# Patient Record
Sex: Female | Born: 1950
Health system: Southern US, Community
[De-identification: ages and names within clinical notes are randomized; demographics above are authoritative.]

## PROBLEM LIST (undated history)

## (undated) DIAGNOSIS — E041 Nontoxic single thyroid nodule: Secondary | ICD-10-CM

## (undated) DIAGNOSIS — N319 Neuromuscular dysfunction of bladder, unspecified: Secondary | ICD-10-CM

## (undated) DIAGNOSIS — R112 Nausea with vomiting, unspecified: Secondary | ICD-10-CM

## (undated) DIAGNOSIS — K219 Gastro-esophageal reflux disease without esophagitis: Secondary | ICD-10-CM

## (undated) DIAGNOSIS — M8580 Other specified disorders of bone density and structure, unspecified site: Secondary | ICD-10-CM

## (undated) DIAGNOSIS — K589 Irritable bowel syndrome without diarrhea: Secondary | ICD-10-CM

## (undated) DIAGNOSIS — J45909 Unspecified asthma, uncomplicated: Secondary | ICD-10-CM

## (undated) DIAGNOSIS — D519 Vitamin B12 deficiency anemia, unspecified: Secondary | ICD-10-CM

## (undated) DIAGNOSIS — M797 Fibromyalgia: Secondary | ICD-10-CM

## (undated) DIAGNOSIS — M810 Age-related osteoporosis without current pathological fracture: Secondary | ICD-10-CM

## (undated) DIAGNOSIS — G35D Multiple sclerosis, unspecified: Secondary | ICD-10-CM

## (undated) DIAGNOSIS — D509 Iron deficiency anemia, unspecified: Secondary | ICD-10-CM

## (undated) DIAGNOSIS — F419 Anxiety disorder, unspecified: Secondary | ICD-10-CM

## (undated) DIAGNOSIS — M1990 Unspecified osteoarthritis, unspecified site: Secondary | ICD-10-CM

## (undated) DIAGNOSIS — I10 Essential (primary) hypertension: Secondary | ICD-10-CM

## (undated) DIAGNOSIS — G35 Multiple sclerosis: Secondary | ICD-10-CM

## (undated) DIAGNOSIS — Z9889 Other specified postprocedural states: Secondary | ICD-10-CM

## (undated) DIAGNOSIS — C169 Malignant neoplasm of stomach, unspecified: Secondary | ICD-10-CM

## (undated) DIAGNOSIS — Z9289 Personal history of other medical treatment: Secondary | ICD-10-CM

## (undated) DIAGNOSIS — K909 Intestinal malabsorption, unspecified: Secondary | ICD-10-CM

## (undated) HISTORY — DX: Intestinal malabsorption, unspecified: K90.9

## (undated) HISTORY — PX: TONSILLECTOMY: SUR1361

## (undated) HISTORY — DX: Irritable bowel syndrome, unspecified: K58.9

## (undated) HISTORY — DX: Other specified disorders of bone density and structure, unspecified site: M85.80

## (undated) HISTORY — DX: Multiple sclerosis, unspecified: G35.D

## (undated) HISTORY — DX: Iron deficiency anemia, unspecified: D50.9

## (undated) HISTORY — DX: Multiple sclerosis: G35

## (undated) HISTORY — DX: Neuromuscular dysfunction of bladder, unspecified: N31.9

## (undated) HISTORY — DX: Essential (primary) hypertension: I10

## (undated) HISTORY — PX: JOINT REPLACEMENT: SHX530

---

## 1988-09-02 HISTORY — PX: BIOPSY THYROID: PRO38

## 1997-06-04 ENCOUNTER — Ambulatory Visit (HOSPITAL_COMMUNITY): Admission: RE | Admit: 1997-06-04 | Discharge: 1997-06-04 | Payer: Self-pay | Admitting: Internal Medicine

## 1997-08-19 ENCOUNTER — Ambulatory Visit (HOSPITAL_COMMUNITY): Admission: RE | Admit: 1997-08-19 | Discharge: 1997-08-19 | Payer: Self-pay | Admitting: Obstetrics and Gynecology

## 1997-09-09 ENCOUNTER — Ambulatory Visit (HOSPITAL_COMMUNITY): Admission: RE | Admit: 1997-09-09 | Discharge: 1997-09-09 | Payer: Self-pay | Admitting: Gastroenterology

## 1997-09-16 ENCOUNTER — Ambulatory Visit (HOSPITAL_COMMUNITY): Admission: RE | Admit: 1997-09-16 | Discharge: 1997-09-16 | Payer: Self-pay | Admitting: Family Medicine

## 1997-10-26 ENCOUNTER — Encounter: Admission: RE | Admit: 1997-10-26 | Discharge: 1998-01-24 | Payer: Self-pay

## 1997-12-22 ENCOUNTER — Encounter: Payer: Self-pay | Admitting: Internal Medicine

## 1997-12-22 ENCOUNTER — Ambulatory Visit (HOSPITAL_COMMUNITY): Admission: RE | Admit: 1997-12-22 | Discharge: 1997-12-22 | Payer: Self-pay | Admitting: Internal Medicine

## 1998-06-10 ENCOUNTER — Ambulatory Visit (HOSPITAL_COMMUNITY): Admission: RE | Admit: 1998-06-10 | Discharge: 1998-06-10 | Payer: Self-pay | Admitting: Internal Medicine

## 1999-01-03 DIAGNOSIS — C169 Malignant neoplasm of stomach, unspecified: Secondary | ICD-10-CM

## 1999-01-03 DIAGNOSIS — Z9289 Personal history of other medical treatment: Secondary | ICD-10-CM

## 1999-01-03 HISTORY — DX: Personal history of other medical treatment: Z92.89

## 1999-01-03 HISTORY — PX: PARTIAL GASTRECTOMY: SHX2172

## 1999-01-03 HISTORY — PX: OMENTECTOMY: SHX2098

## 1999-01-03 HISTORY — DX: Malignant neoplasm of stomach, unspecified: C16.9

## 1999-02-11 ENCOUNTER — Encounter: Payer: Self-pay | Admitting: Internal Medicine

## 1999-02-11 ENCOUNTER — Ambulatory Visit (HOSPITAL_COMMUNITY): Admission: RE | Admit: 1999-02-11 | Discharge: 1999-02-11 | Payer: Self-pay | Admitting: Internal Medicine

## 1999-03-12 ENCOUNTER — Ambulatory Visit (HOSPITAL_COMMUNITY): Admission: RE | Admit: 1999-03-12 | Discharge: 1999-03-12 | Payer: Self-pay | Admitting: Neurology

## 1999-03-12 ENCOUNTER — Encounter: Payer: Self-pay | Admitting: Neurology

## 1999-03-23 ENCOUNTER — Inpatient Hospital Stay (HOSPITAL_COMMUNITY): Admission: EM | Admit: 1999-03-23 | Discharge: 1999-04-14 | Payer: Self-pay | Admitting: Emergency Medicine

## 1999-03-23 ENCOUNTER — Encounter (INDEPENDENT_AMBULATORY_CARE_PROVIDER_SITE_OTHER): Payer: Self-pay | Admitting: Specialist

## 1999-03-24 ENCOUNTER — Encounter: Payer: Self-pay | Admitting: Gastroenterology

## 1999-03-25 ENCOUNTER — Encounter: Payer: Self-pay | Admitting: Gastroenterology

## 1999-03-26 ENCOUNTER — Encounter: Payer: Self-pay | Admitting: Gastroenterology

## 1999-03-28 ENCOUNTER — Encounter: Payer: Self-pay | Admitting: General Surgery

## 1999-04-11 ENCOUNTER — Encounter: Payer: Self-pay | Admitting: General Surgery

## 1999-05-06 ENCOUNTER — Ambulatory Visit (HOSPITAL_BASED_OUTPATIENT_CLINIC_OR_DEPARTMENT_OTHER): Admission: RE | Admit: 1999-05-06 | Discharge: 1999-05-06 | Payer: Self-pay | Admitting: General Surgery

## 1999-05-06 ENCOUNTER — Encounter: Payer: Self-pay | Admitting: General Surgery

## 1999-06-01 ENCOUNTER — Encounter: Admission: RE | Admit: 1999-06-01 | Discharge: 1999-08-30 | Payer: Self-pay | Admitting: Radiation Oncology

## 1999-06-06 ENCOUNTER — Ambulatory Visit (HOSPITAL_COMMUNITY): Admission: RE | Admit: 1999-06-06 | Discharge: 1999-06-06 | Payer: Self-pay | Admitting: Radiation Oncology

## 1999-06-13 ENCOUNTER — Encounter: Payer: Self-pay | Admitting: Radiation Oncology

## 1999-09-03 ENCOUNTER — Inpatient Hospital Stay (HOSPITAL_COMMUNITY): Admission: EM | Admit: 1999-09-03 | Discharge: 1999-09-08 | Payer: Self-pay | Admitting: Oncology

## 1999-09-04 ENCOUNTER — Encounter: Payer: Self-pay | Admitting: Oncology

## 1999-09-07 ENCOUNTER — Encounter: Payer: Self-pay | Admitting: Hematology & Oncology

## 1999-09-07 ENCOUNTER — Encounter: Admission: RE | Admit: 1999-09-07 | Discharge: 1999-09-07 | Payer: Self-pay | Admitting: Hematology & Oncology

## 1999-09-17 ENCOUNTER — Emergency Department (HOSPITAL_COMMUNITY): Admission: EM | Admit: 1999-09-17 | Discharge: 1999-09-17 | Payer: Self-pay | Admitting: Emergency Medicine

## 1999-10-17 ENCOUNTER — Encounter: Payer: Self-pay | Admitting: Hematology & Oncology

## 1999-10-17 ENCOUNTER — Ambulatory Visit (HOSPITAL_COMMUNITY): Admission: RE | Admit: 1999-10-17 | Discharge: 1999-10-17 | Payer: Self-pay | Admitting: Hematology & Oncology

## 1999-10-24 ENCOUNTER — Ambulatory Visit (HOSPITAL_COMMUNITY): Admission: RE | Admit: 1999-10-24 | Discharge: 1999-10-24 | Payer: Self-pay | Admitting: Hematology & Oncology

## 1999-10-24 ENCOUNTER — Encounter: Payer: Self-pay | Admitting: Hematology & Oncology

## 2000-01-23 ENCOUNTER — Ambulatory Visit (HOSPITAL_COMMUNITY): Admission: RE | Admit: 2000-01-23 | Discharge: 2000-01-23 | Payer: Self-pay | Admitting: Hematology & Oncology

## 2000-01-23 ENCOUNTER — Encounter: Payer: Self-pay | Admitting: Hematology & Oncology

## 2000-02-14 ENCOUNTER — Encounter: Payer: Self-pay | Admitting: General Surgery

## 2000-02-14 ENCOUNTER — Ambulatory Visit (HOSPITAL_COMMUNITY): Admission: RE | Admit: 2000-02-14 | Discharge: 2000-02-14 | Payer: Self-pay | Admitting: Internal Medicine

## 2000-02-14 ENCOUNTER — Emergency Department (HOSPITAL_COMMUNITY): Admission: EM | Admit: 2000-02-14 | Discharge: 2000-02-14 | Payer: Self-pay | Admitting: Emergency Medicine

## 2000-02-16 ENCOUNTER — Encounter: Admission: RE | Admit: 2000-02-16 | Discharge: 2000-02-16 | Payer: Self-pay | Admitting: Internal Medicine

## 2000-02-16 ENCOUNTER — Encounter: Payer: Self-pay | Admitting: Internal Medicine

## 2000-03-05 ENCOUNTER — Encounter: Payer: Self-pay | Admitting: Internal Medicine

## 2000-03-05 ENCOUNTER — Ambulatory Visit (HOSPITAL_COMMUNITY): Admission: RE | Admit: 2000-03-05 | Discharge: 2000-03-05 | Payer: Self-pay

## 2000-03-16 ENCOUNTER — Ambulatory Visit (HOSPITAL_COMMUNITY): Admission: RE | Admit: 2000-03-16 | Discharge: 2000-03-16 | Payer: Self-pay | Admitting: Gastroenterology

## 2000-04-12 ENCOUNTER — Ambulatory Visit (HOSPITAL_BASED_OUTPATIENT_CLINIC_OR_DEPARTMENT_OTHER): Admission: RE | Admit: 2000-04-12 | Discharge: 2000-04-12 | Payer: Self-pay | Admitting: General Surgery

## 2000-05-02 ENCOUNTER — Encounter: Payer: Self-pay | Admitting: Internal Medicine

## 2000-05-02 ENCOUNTER — Ambulatory Visit (HOSPITAL_COMMUNITY): Admission: RE | Admit: 2000-05-02 | Discharge: 2000-05-02 | Payer: Self-pay | Admitting: Internal Medicine

## 2000-06-07 ENCOUNTER — Encounter: Payer: Self-pay | Admitting: Hematology & Oncology

## 2000-06-07 ENCOUNTER — Encounter: Admission: RE | Admit: 2000-06-07 | Discharge: 2000-06-07 | Payer: Self-pay | Admitting: Hematology & Oncology

## 2000-09-11 ENCOUNTER — Encounter: Payer: Self-pay | Admitting: Hematology & Oncology

## 2000-09-11 ENCOUNTER — Encounter: Admission: RE | Admit: 2000-09-11 | Discharge: 2000-09-11 | Payer: Self-pay | Admitting: Hematology & Oncology

## 2000-09-14 ENCOUNTER — Other Ambulatory Visit: Admission: RE | Admit: 2000-09-14 | Discharge: 2000-09-14 | Payer: Self-pay | Admitting: Obstetrics and Gynecology

## 2000-09-26 ENCOUNTER — Encounter: Payer: Self-pay | Admitting: Neurology

## 2000-09-26 ENCOUNTER — Encounter: Admission: RE | Admit: 2000-09-26 | Discharge: 2000-09-26 | Payer: Self-pay | Admitting: Neurology

## 2000-10-10 ENCOUNTER — Emergency Department (HOSPITAL_COMMUNITY): Admission: EM | Admit: 2000-10-10 | Discharge: 2000-10-10 | Payer: Self-pay | Admitting: Emergency Medicine

## 2000-10-10 ENCOUNTER — Encounter: Payer: Self-pay | Admitting: Emergency Medicine

## 2000-11-22 ENCOUNTER — Encounter: Admission: RE | Admit: 2000-11-22 | Discharge: 2000-11-22 | Payer: Self-pay | Admitting: Hematology & Oncology

## 2000-11-22 ENCOUNTER — Encounter: Payer: Self-pay | Admitting: Hematology & Oncology

## 2000-11-27 ENCOUNTER — Encounter: Admission: RE | Admit: 2000-11-27 | Discharge: 2000-11-27 | Payer: Self-pay | Admitting: Internal Medicine

## 2000-11-27 ENCOUNTER — Encounter: Payer: Self-pay | Admitting: Internal Medicine

## 2000-12-06 ENCOUNTER — Encounter: Payer: Self-pay | Admitting: Internal Medicine

## 2000-12-06 ENCOUNTER — Ambulatory Visit (HOSPITAL_COMMUNITY): Admission: RE | Admit: 2000-12-06 | Discharge: 2000-12-06 | Payer: Self-pay | Admitting: Internal Medicine

## 2001-03-04 ENCOUNTER — Encounter: Payer: Self-pay | Admitting: Hematology & Oncology

## 2001-03-04 ENCOUNTER — Encounter: Admission: RE | Admit: 2001-03-04 | Discharge: 2001-03-04 | Payer: Self-pay | Admitting: Hematology & Oncology

## 2001-05-19 ENCOUNTER — Emergency Department (HOSPITAL_COMMUNITY): Admission: EM | Admit: 2001-05-19 | Discharge: 2001-05-20 | Payer: Self-pay | Admitting: Emergency Medicine

## 2001-05-28 ENCOUNTER — Ambulatory Visit (HOSPITAL_COMMUNITY): Admission: RE | Admit: 2001-05-28 | Discharge: 2001-05-28 | Payer: Self-pay | Admitting: Gastroenterology

## 2001-06-04 ENCOUNTER — Encounter: Payer: Self-pay | Admitting: Internal Medicine

## 2001-06-04 ENCOUNTER — Ambulatory Visit (HOSPITAL_COMMUNITY): Admission: RE | Admit: 2001-06-04 | Discharge: 2001-06-04 | Payer: Self-pay | Admitting: Internal Medicine

## 2001-06-26 ENCOUNTER — Encounter: Payer: Self-pay | Admitting: Hematology & Oncology

## 2001-06-26 ENCOUNTER — Encounter: Admission: RE | Admit: 2001-06-26 | Discharge: 2001-06-26 | Payer: Self-pay | Admitting: Hematology & Oncology

## 2001-09-17 ENCOUNTER — Encounter: Payer: Self-pay | Admitting: Internal Medicine

## 2001-09-17 ENCOUNTER — Encounter: Admission: RE | Admit: 2001-09-17 | Discharge: 2001-09-17 | Payer: Self-pay | Admitting: Internal Medicine

## 2001-11-07 ENCOUNTER — Encounter: Payer: Self-pay | Admitting: Obstetrics and Gynecology

## 2001-11-07 ENCOUNTER — Encounter: Admission: RE | Admit: 2001-11-07 | Discharge: 2001-11-07 | Payer: Self-pay | Admitting: Obstetrics and Gynecology

## 2001-12-08 ENCOUNTER — Emergency Department (HOSPITAL_COMMUNITY): Admission: EM | Admit: 2001-12-08 | Discharge: 2001-12-09 | Payer: Self-pay | Admitting: Emergency Medicine

## 2001-12-08 ENCOUNTER — Encounter: Payer: Self-pay | Admitting: Emergency Medicine

## 2001-12-09 ENCOUNTER — Encounter: Payer: Self-pay | Admitting: General Surgery

## 2001-12-09 ENCOUNTER — Ambulatory Visit (HOSPITAL_COMMUNITY): Admission: RE | Admit: 2001-12-09 | Discharge: 2001-12-09 | Payer: Self-pay | Admitting: General Surgery

## 2001-12-12 ENCOUNTER — Encounter: Payer: Self-pay | Admitting: Internal Medicine

## 2001-12-12 ENCOUNTER — Encounter: Admission: RE | Admit: 2001-12-12 | Discharge: 2001-12-12 | Payer: Self-pay | Admitting: Internal Medicine

## 2001-12-14 ENCOUNTER — Encounter: Admission: RE | Admit: 2001-12-14 | Discharge: 2001-12-14 | Payer: Self-pay | Admitting: Internal Medicine

## 2001-12-14 ENCOUNTER — Encounter: Payer: Self-pay | Admitting: Internal Medicine

## 2001-12-16 ENCOUNTER — Encounter: Payer: Self-pay | Admitting: Gastroenterology

## 2001-12-16 ENCOUNTER — Ambulatory Visit (HOSPITAL_COMMUNITY): Admission: RE | Admit: 2001-12-16 | Discharge: 2001-12-16 | Payer: Self-pay | Admitting: Gastroenterology

## 2002-01-13 ENCOUNTER — Ambulatory Visit (HOSPITAL_COMMUNITY): Admission: RE | Admit: 2002-01-13 | Discharge: 2002-01-13 | Payer: Self-pay | Admitting: Internal Medicine

## 2002-01-13 ENCOUNTER — Encounter: Payer: Self-pay | Admitting: Internal Medicine

## 2002-01-13 ENCOUNTER — Encounter (INDEPENDENT_AMBULATORY_CARE_PROVIDER_SITE_OTHER): Payer: Self-pay | Admitting: *Deleted

## 2002-03-03 ENCOUNTER — Emergency Department (HOSPITAL_COMMUNITY): Admission: EM | Admit: 2002-03-03 | Discharge: 2002-03-03 | Payer: Self-pay | Admitting: Emergency Medicine

## 2002-03-04 ENCOUNTER — Encounter: Payer: Self-pay | Admitting: Emergency Medicine

## 2002-03-17 ENCOUNTER — Encounter: Admission: RE | Admit: 2002-03-17 | Discharge: 2002-04-04 | Payer: Self-pay | Admitting: Neurology

## 2002-03-25 ENCOUNTER — Encounter: Admission: RE | Admit: 2002-03-25 | Discharge: 2002-03-25 | Payer: Self-pay | Admitting: Internal Medicine

## 2002-03-25 ENCOUNTER — Encounter: Payer: Self-pay | Admitting: Internal Medicine

## 2002-12-15 ENCOUNTER — Encounter: Admission: RE | Admit: 2002-12-15 | Discharge: 2002-12-15 | Payer: Self-pay | Admitting: Internal Medicine

## 2002-12-24 ENCOUNTER — Emergency Department (HOSPITAL_COMMUNITY): Admission: EM | Admit: 2002-12-24 | Discharge: 2002-12-24 | Payer: Self-pay | Admitting: Emergency Medicine

## 2002-12-29 ENCOUNTER — Encounter: Admission: RE | Admit: 2002-12-29 | Discharge: 2002-12-29 | Payer: Self-pay | Admitting: Internal Medicine

## 2003-01-05 ENCOUNTER — Emergency Department (HOSPITAL_COMMUNITY): Admission: EM | Admit: 2003-01-05 | Discharge: 2003-01-05 | Payer: Self-pay | Admitting: Emergency Medicine

## 2003-03-10 ENCOUNTER — Ambulatory Visit (HOSPITAL_COMMUNITY): Admission: RE | Admit: 2003-03-10 | Discharge: 2003-03-10 | Payer: Self-pay | Admitting: Internal Medicine

## 2003-03-18 ENCOUNTER — Encounter: Admission: RE | Admit: 2003-03-18 | Discharge: 2003-04-16 | Payer: Self-pay | Admitting: Neurology

## 2003-03-19 ENCOUNTER — Encounter: Admission: RE | Admit: 2003-03-19 | Discharge: 2003-03-19 | Payer: Self-pay | Admitting: Internal Medicine

## 2003-05-31 ENCOUNTER — Inpatient Hospital Stay (HOSPITAL_COMMUNITY): Admission: AD | Admit: 2003-05-31 | Discharge: 2003-06-04 | Payer: Self-pay | Admitting: Pediatrics

## 2003-06-24 ENCOUNTER — Emergency Department (HOSPITAL_COMMUNITY): Admission: EM | Admit: 2003-06-24 | Discharge: 2003-06-25 | Payer: Self-pay | Admitting: Emergency Medicine

## 2003-07-13 ENCOUNTER — Encounter: Admission: RE | Admit: 2003-07-13 | Discharge: 2003-10-11 | Payer: Self-pay | Admitting: Pediatrics

## 2003-11-23 ENCOUNTER — Other Ambulatory Visit: Admission: RE | Admit: 2003-11-23 | Discharge: 2003-11-23 | Payer: Self-pay | Admitting: Internal Medicine

## 2004-01-11 ENCOUNTER — Ambulatory Visit: Payer: Self-pay | Admitting: Hematology & Oncology

## 2004-05-06 ENCOUNTER — Ambulatory Visit: Payer: Self-pay | Admitting: Hematology & Oncology

## 2004-08-25 ENCOUNTER — Encounter: Admission: RE | Admit: 2004-08-25 | Discharge: 2004-08-25 | Payer: Self-pay | Admitting: Internal Medicine

## 2004-09-28 ENCOUNTER — Encounter: Admission: RE | Admit: 2004-09-28 | Discharge: 2004-09-28 | Payer: Self-pay | Admitting: Internal Medicine

## 2004-10-12 ENCOUNTER — Encounter: Admission: RE | Admit: 2004-10-12 | Discharge: 2004-10-12 | Payer: Self-pay | Admitting: Internal Medicine

## 2004-10-13 ENCOUNTER — Encounter: Admission: RE | Admit: 2004-10-13 | Discharge: 2004-10-13 | Payer: Self-pay | Admitting: Internal Medicine

## 2004-11-04 ENCOUNTER — Ambulatory Visit: Payer: Self-pay | Admitting: Hematology & Oncology

## 2005-05-04 ENCOUNTER — Ambulatory Visit: Payer: Self-pay | Admitting: Hematology & Oncology

## 2005-05-08 LAB — COMPREHENSIVE METABOLIC PANEL
ALT: 24 U/L (ref 0–40)
AST: 29 U/L (ref 0–37)
Albumin: 3.9 g/dL (ref 3.5–5.2)
Alkaline Phosphatase: 103 U/L (ref 39–117)
BUN: 13 mg/dL (ref 6–23)
Potassium: 3.9 mEq/L (ref 3.5–5.3)
Sodium: 143 mEq/L (ref 135–145)

## 2005-05-08 LAB — CBC WITH DIFFERENTIAL/PLATELET
BASO%: 0.6 % (ref 0.0–2.0)
Basophils Absolute: 0 10*3/uL (ref 0.0–0.1)
EOS%: 2.9 % (ref 0.0–7.0)
MCH: 30.6 pg (ref 26.0–34.0)
MCHC: 34.3 g/dL (ref 32.0–36.0)
MCV: 89.3 fL (ref 81.0–101.0)
MONO%: 11 % (ref 0.0–13.0)
RBC: 3.84 10*6/uL (ref 3.70–5.32)
RDW: 12.6 % (ref 11.3–14.5)

## 2005-07-13 ENCOUNTER — Other Ambulatory Visit: Admission: RE | Admit: 2005-07-13 | Discharge: 2005-07-13 | Payer: Self-pay | Admitting: Internal Medicine

## 2005-08-26 ENCOUNTER — Emergency Department (HOSPITAL_COMMUNITY): Admission: EM | Admit: 2005-08-26 | Discharge: 2005-08-26 | Payer: Self-pay | Admitting: Emergency Medicine

## 2005-11-02 ENCOUNTER — Ambulatory Visit: Payer: Self-pay | Admitting: Hematology & Oncology

## 2005-11-06 LAB — CBC WITH DIFFERENTIAL/PLATELET
EOS%: 2.6 % (ref 0.0–7.0)
HGB: 11.7 g/dL (ref 11.6–15.9)
MCH: 30.6 pg (ref 26.0–34.0)
MCV: 90 fL (ref 81.0–101.0)
MONO%: 9.6 % (ref 0.0–13.0)
NEUT#: 1.8 10*3/uL (ref 1.5–6.5)
RBC: 3.83 10*6/uL (ref 3.70–5.32)
RDW: 12.5 % (ref 11.3–14.5)
lymph#: 1.5 10*3/uL (ref 0.9–3.3)

## 2005-11-06 LAB — COMPREHENSIVE METABOLIC PANEL
ALT: 22 U/L (ref 0–35)
AST: 29 U/L (ref 0–37)
Albumin: 4.1 g/dL (ref 3.5–5.2)
Alkaline Phosphatase: 98 U/L (ref 39–117)
Calcium: 9.1 mg/dL (ref 8.4–10.5)
Chloride: 106 mEq/L (ref 96–112)
Potassium: 3.7 mEq/L (ref 3.5–5.3)
Sodium: 141 mEq/L (ref 135–145)
Total Protein: 7.2 g/dL (ref 6.0–8.3)

## 2005-11-07 ENCOUNTER — Encounter: Admission: RE | Admit: 2005-11-07 | Discharge: 2005-11-07 | Payer: Self-pay | Admitting: Internal Medicine

## 2006-05-03 ENCOUNTER — Ambulatory Visit: Payer: Self-pay | Admitting: Hematology & Oncology

## 2006-05-07 LAB — CBC WITH DIFFERENTIAL/PLATELET
BASO%: 0.3 % (ref 0.0–2.0)
Eosinophils Absolute: 0.1 10*3/uL (ref 0.0–0.5)
LYMPH%: 43.3 % (ref 14.0–48.0)
MCHC: 34.6 g/dL (ref 32.0–36.0)
MONO#: 0.4 10*3/uL (ref 0.1–0.9)
NEUT#: 1.5 10*3/uL (ref 1.5–6.5)
RBC: 3.79 10*6/uL (ref 3.70–5.32)
RDW: 12.8 % (ref 11.3–14.5)
WBC: 3.5 10*3/uL — ABNORMAL LOW (ref 3.9–10.0)

## 2006-05-07 LAB — COMPREHENSIVE METABOLIC PANEL
ALT: 21 U/L (ref 0–35)
Albumin: 4 g/dL (ref 3.5–5.2)
Alkaline Phosphatase: 109 U/L (ref 39–117)
CO2: 28 mEq/L (ref 19–32)
Glucose, Bld: 81 mg/dL (ref 70–99)
Potassium: 4.1 mEq/L (ref 3.5–5.3)
Sodium: 146 mEq/L — ABNORMAL HIGH (ref 135–145)
Total Bilirubin: 0.5 mg/dL (ref 0.3–1.2)
Total Protein: 6.6 g/dL (ref 6.0–8.3)

## 2006-05-07 LAB — LACTATE DEHYDROGENASE: LDH: 145 U/L (ref 94–250)

## 2006-11-01 ENCOUNTER — Ambulatory Visit: Payer: Self-pay | Admitting: Hematology & Oncology

## 2006-11-05 LAB — CBC WITH DIFFERENTIAL/PLATELET
BASO%: 0.4 % (ref 0.0–2.0)
EOS%: 2.7 % (ref 0.0–7.0)
HCT: 37.4 % (ref 34.8–46.6)
LYMPH%: 39.7 % (ref 14.0–48.0)
MCH: 30.2 pg (ref 26.0–34.0)
MCHC: 34 g/dL (ref 32.0–36.0)
NEUT%: 47.7 % (ref 39.6–76.8)
Platelets: 186 10*3/uL (ref 145–400)
RBC: 4.2 10*6/uL (ref 3.70–5.32)
WBC: 4.7 10*3/uL (ref 3.9–10.0)

## 2006-11-05 LAB — COMPREHENSIVE METABOLIC PANEL
ALT: 18 U/L (ref 0–35)
AST: 24 U/L (ref 0–37)
Alkaline Phosphatase: 110 U/L (ref 39–117)
Creatinine, Ser: 1.1 mg/dL (ref 0.40–1.20)
Sodium: 144 mEq/L (ref 135–145)
Total Bilirubin: 0.5 mg/dL (ref 0.3–1.2)
Total Protein: 7.7 g/dL (ref 6.0–8.3)

## 2006-11-09 ENCOUNTER — Ambulatory Visit (HOSPITAL_COMMUNITY): Admission: RE | Admit: 2006-11-09 | Discharge: 2006-11-09 | Payer: Self-pay | Admitting: Hematology & Oncology

## 2006-12-17 ENCOUNTER — Ambulatory Visit (HOSPITAL_COMMUNITY): Admission: RE | Admit: 2006-12-17 | Discharge: 2006-12-17 | Payer: Self-pay | Admitting: Internal Medicine

## 2007-05-01 ENCOUNTER — Ambulatory Visit: Payer: Self-pay | Admitting: Hematology & Oncology

## 2007-05-06 LAB — CBC WITH DIFFERENTIAL/PLATELET
Basophils Absolute: 0 10*3/uL (ref 0.0–0.1)
EOS%: 2.8 % (ref 0.0–7.0)
Eosinophils Absolute: 0.1 10*3/uL (ref 0.0–0.5)
HGB: 12.1 g/dL (ref 11.6–15.9)
MONO#: 0.4 10*3/uL (ref 0.1–0.9)
NEUT#: 1.6 10*3/uL (ref 1.5–6.5)
RDW: 12.9 % (ref 11.3–14.5)
WBC: 3.2 10*3/uL — ABNORMAL LOW (ref 3.9–10.0)
lymph#: 1.1 10*3/uL (ref 0.9–3.3)

## 2007-05-06 LAB — COMPREHENSIVE METABOLIC PANEL
AST: 24 U/L (ref 0–37)
Albumin: 4.1 g/dL (ref 3.5–5.2)
BUN: 14 mg/dL (ref 6–23)
CO2: 27 mEq/L (ref 19–32)
Calcium: 9.6 mg/dL (ref 8.4–10.5)
Chloride: 107 mEq/L (ref 96–112)
Potassium: 4.4 mEq/L (ref 3.5–5.3)

## 2007-05-07 ENCOUNTER — Other Ambulatory Visit: Admission: RE | Admit: 2007-05-07 | Discharge: 2007-05-07 | Payer: Self-pay | Admitting: Internal Medicine

## 2007-09-24 ENCOUNTER — Encounter: Admission: RE | Admit: 2007-09-24 | Discharge: 2007-09-24 | Payer: Self-pay | Admitting: Internal Medicine

## 2007-10-11 ENCOUNTER — Ambulatory Visit: Payer: Self-pay | Admitting: Internal Medicine

## 2007-11-08 ENCOUNTER — Ambulatory Visit: Payer: Self-pay | Admitting: Hematology & Oncology

## 2008-02-04 ENCOUNTER — Ambulatory Visit: Payer: Self-pay | Admitting: Hematology & Oncology

## 2008-02-05 LAB — CBC WITH DIFFERENTIAL (CANCER CENTER ONLY)
BASO%: 0.5 % (ref 0.0–2.0)
Eosinophils Absolute: 0.1 10*3/uL (ref 0.0–0.5)
LYMPH%: 42.6 % (ref 14.0–48.0)
MCV: 88 fL (ref 81–101)
MONO#: 0.2 10*3/uL (ref 0.1–0.9)
NEUT#: 0.9 10*3/uL — ABNORMAL LOW (ref 1.5–6.5)
Platelets: 165 10*3/uL (ref 145–400)
RBC: 3.76 10*6/uL (ref 3.70–5.32)
RDW: 11.8 % (ref 10.5–14.6)
WBC: 2.1 10*3/uL — ABNORMAL LOW (ref 3.9–10.0)

## 2008-02-05 LAB — COMPREHENSIVE METABOLIC PANEL
Albumin: 4 g/dL (ref 3.5–5.2)
BUN: 12 mg/dL (ref 6–23)
Calcium: 8.8 mg/dL (ref 8.4–10.5)
Chloride: 107 mEq/L (ref 96–112)
Glucose, Bld: 96 mg/dL (ref 70–99)
Potassium: 3.7 mEq/L (ref 3.5–5.3)

## 2008-03-04 ENCOUNTER — Encounter: Admission: RE | Admit: 2008-03-04 | Discharge: 2008-03-04 | Payer: Self-pay | Admitting: Neurology

## 2008-04-10 ENCOUNTER — Other Ambulatory Visit: Admission: RE | Admit: 2008-04-10 | Discharge: 2008-04-10 | Payer: Self-pay | Admitting: Internal Medicine

## 2008-04-10 ENCOUNTER — Ambulatory Visit: Payer: Self-pay | Admitting: Internal Medicine

## 2008-08-14 ENCOUNTER — Ambulatory Visit: Payer: Self-pay | Admitting: Internal Medicine

## 2008-08-18 ENCOUNTER — Ambulatory Visit: Payer: Self-pay | Admitting: Hematology & Oncology

## 2008-08-19 LAB — CBC WITH DIFFERENTIAL (CANCER CENTER ONLY)
BASO%: 0.3 % (ref 0.0–2.0)
HCT: 35.8 % (ref 34.8–46.6)
LYMPH%: 42.7 % (ref 14.0–48.0)
MCH: 30.1 pg (ref 26.0–34.0)
MCHC: 33.8 g/dL (ref 32.0–36.0)
MCV: 89 fL (ref 81–101)
MONO#: 0.2 10*3/uL (ref 0.1–0.9)
MONO%: 8.6 % (ref 0.0–13.0)
NEUT%: 44 % (ref 39.6–80.0)
Platelets: 187 10*3/uL (ref 145–400)
RDW: 11.4 % (ref 10.5–14.6)
WBC: 2.6 10*3/uL — ABNORMAL LOW (ref 3.9–10.0)

## 2008-08-19 LAB — COMPREHENSIVE METABOLIC PANEL
ALT: 14 U/L (ref 0–35)
AST: 25 U/L (ref 0–37)
Alkaline Phosphatase: 88 U/L (ref 39–117)
BUN: 17 mg/dL (ref 6–23)
Creatinine, Ser: 1.28 mg/dL — ABNORMAL HIGH (ref 0.40–1.20)
Potassium: 3.8 mEq/L (ref 3.5–5.3)

## 2008-08-20 ENCOUNTER — Ambulatory Visit: Payer: Self-pay | Admitting: Internal Medicine

## 2008-09-28 ENCOUNTER — Ambulatory Visit: Payer: Self-pay | Admitting: Internal Medicine

## 2008-10-13 ENCOUNTER — Ambulatory Visit: Payer: Self-pay | Admitting: Internal Medicine

## 2008-10-16 ENCOUNTER — Ambulatory Visit: Payer: Self-pay | Admitting: Internal Medicine

## 2008-11-12 ENCOUNTER — Ambulatory Visit: Payer: Self-pay | Admitting: Internal Medicine

## 2009-01-02 HISTORY — PX: TOTAL SHOULDER ARTHROPLASTY: SHX126

## 2009-02-04 ENCOUNTER — Ambulatory Visit: Payer: Self-pay | Admitting: Hematology & Oncology

## 2009-02-05 LAB — CBC WITH DIFFERENTIAL (CANCER CENTER ONLY)
BASO#: 0 10*3/uL (ref 0.0–0.2)
Eosinophils Absolute: 0.1 10*3/uL (ref 0.0–0.5)
HGB: 14.6 g/dL (ref 11.6–15.9)
LYMPH#: 1.6 10*3/uL (ref 0.9–3.3)
MCH: 30.2 pg (ref 26.0–34.0)
MCHC: 33 g/dL (ref 32.0–36.0)
MONO#: 0.3 10*3/uL (ref 0.1–0.9)
MONO%: 9.2 % (ref 0.0–13.0)
NEUT%: 42.4 % (ref 39.6–80.0)
Platelets: 215 10*3/uL (ref 145–400)
RBC: 4.82 10*6/uL (ref 3.70–5.32)

## 2009-02-05 LAB — COMPREHENSIVE METABOLIC PANEL
ALT: 17 U/L (ref 0–35)
AST: 23 U/L (ref 0–37)
Calcium: 9.7 mg/dL (ref 8.4–10.5)
Creatinine, Ser: 1.2 mg/dL (ref 0.40–1.20)
Sodium: 141 mEq/L (ref 135–145)

## 2009-02-05 LAB — VITAMIN D 25 HYDROXY (VIT D DEFICIENCY, FRACTURES): Vit D, 25-Hydroxy: 25 ng/mL — ABNORMAL LOW (ref 30–89)

## 2009-02-09 ENCOUNTER — Ambulatory Visit: Payer: Self-pay | Admitting: Internal Medicine

## 2009-02-22 ENCOUNTER — Ambulatory Visit: Payer: Self-pay | Admitting: Internal Medicine

## 2009-02-26 ENCOUNTER — Ambulatory Visit (HOSPITAL_COMMUNITY): Admission: RE | Admit: 2009-02-26 | Discharge: 2009-02-26 | Payer: Self-pay | Admitting: Internal Medicine

## 2009-03-08 ENCOUNTER — Ambulatory Visit: Payer: Self-pay | Admitting: Internal Medicine

## 2009-03-19 ENCOUNTER — Encounter: Admission: RE | Admit: 2009-03-19 | Discharge: 2009-03-19 | Payer: Self-pay | Admitting: Neurology

## 2009-04-05 ENCOUNTER — Ambulatory Visit (HOSPITAL_COMMUNITY): Admission: RE | Admit: 2009-04-05 | Discharge: 2009-04-05 | Payer: Self-pay | Admitting: Hematology & Oncology

## 2009-04-06 ENCOUNTER — Ambulatory Visit: Payer: Self-pay | Admitting: Internal Medicine

## 2009-04-10 ENCOUNTER — Encounter: Admission: RE | Admit: 2009-04-10 | Discharge: 2009-04-10 | Payer: Self-pay | Admitting: Internal Medicine

## 2009-05-13 ENCOUNTER — Ambulatory Visit: Payer: Self-pay | Admitting: Internal Medicine

## 2009-06-15 ENCOUNTER — Ambulatory Visit: Payer: Self-pay | Admitting: Internal Medicine

## 2009-08-10 ENCOUNTER — Ambulatory Visit: Payer: Self-pay | Admitting: Internal Medicine

## 2009-08-19 ENCOUNTER — Ambulatory Visit: Payer: Self-pay | Admitting: Hematology & Oncology

## 2009-08-27 LAB — COMPREHENSIVE METABOLIC PANEL
AST: 28 U/L (ref 0–37)
Albumin: 4.1 g/dL (ref 3.5–5.2)
Alkaline Phosphatase: 91 U/L (ref 39–117)
BUN: 16 mg/dL (ref 6–23)
Calcium: 9.3 mg/dL (ref 8.4–10.5)
Potassium: 3.8 mEq/L (ref 3.5–5.3)
Total Bilirubin: 0.8 mg/dL (ref 0.3–1.2)
Total Protein: 7.1 g/dL (ref 6.0–8.3)

## 2009-08-27 LAB — CBC WITH DIFFERENTIAL (CANCER CENTER ONLY)
LYMPH#: 1.4 10*3/uL (ref 0.9–3.3)
LYMPH%: 40 % (ref 14.0–48.0)
NEUT#: 1.6 10*3/uL (ref 1.5–6.5)
NEUT%: 46.5 % (ref 39.6–80.0)
RDW: 11.1 % (ref 10.5–14.6)

## 2009-09-02 ENCOUNTER — Emergency Department (HOSPITAL_COMMUNITY): Admission: EM | Admit: 2009-09-02 | Discharge: 2009-09-02 | Payer: Self-pay | Admitting: Emergency Medicine

## 2009-09-10 ENCOUNTER — Ambulatory Visit: Payer: Self-pay | Admitting: Internal Medicine

## 2009-09-24 ENCOUNTER — Inpatient Hospital Stay (HOSPITAL_COMMUNITY): Admission: RE | Admit: 2009-09-24 | Discharge: 2009-09-27 | Payer: Self-pay | Admitting: Orthopedic Surgery

## 2009-10-12 ENCOUNTER — Ambulatory Visit: Payer: Self-pay | Admitting: Internal Medicine

## 2009-11-16 ENCOUNTER — Ambulatory Visit: Payer: Self-pay | Admitting: Internal Medicine

## 2009-12-16 ENCOUNTER — Ambulatory Visit: Payer: Self-pay | Admitting: Internal Medicine

## 2010-01-20 ENCOUNTER — Ambulatory Visit
Admission: RE | Admit: 2010-01-20 | Discharge: 2010-01-20 | Payer: Self-pay | Source: Home / Self Care | Attending: Internal Medicine | Admitting: Internal Medicine

## 2010-02-10 ENCOUNTER — Encounter (HOSPITAL_BASED_OUTPATIENT_CLINIC_OR_DEPARTMENT_OTHER): Payer: Medicare Other | Admitting: Hematology & Oncology

## 2010-02-10 ENCOUNTER — Other Ambulatory Visit: Payer: Self-pay | Admitting: Hematology & Oncology

## 2010-02-10 DIAGNOSIS — C161 Malignant neoplasm of fundus of stomach: Secondary | ICD-10-CM

## 2010-02-10 DIAGNOSIS — G35 Multiple sclerosis: Secondary | ICD-10-CM

## 2010-02-10 DIAGNOSIS — G35D Multiple sclerosis, unspecified: Secondary | ICD-10-CM

## 2010-02-10 DIAGNOSIS — D649 Anemia, unspecified: Secondary | ICD-10-CM

## 2010-02-10 LAB — CBC WITH DIFFERENTIAL (CANCER CENTER ONLY)
EOS%: 5 % (ref 0.0–7.0)
HCT: 31.3 % — ABNORMAL LOW (ref 34.8–46.6)
LYMPH%: 42.7 % (ref 14.0–48.0)
MCHC: 32.4 g/dL (ref 32.0–36.0)
MONO%: 8.7 % (ref 0.0–13.0)
Platelets: 177 10*3/uL (ref 145–400)
RBC: 3.74 10*6/uL (ref 3.70–5.32)
WBC: 2.5 10*3/uL — ABNORMAL LOW (ref 3.9–10.0)

## 2010-02-11 LAB — COMPREHENSIVE METABOLIC PANEL
ALT: 13 U/L (ref 0–35)
Albumin: 4 g/dL (ref 3.5–5.2)
Alkaline Phosphatase: 113 U/L (ref 39–117)
CO2: 25 mEq/L (ref 19–32)
Calcium: 9.3 mg/dL (ref 8.4–10.5)
Chloride: 105 mEq/L (ref 96–112)
Creatinine, Ser: 1.09 mg/dL (ref 0.40–1.20)
Sodium: 141 mEq/L (ref 135–145)
Total Bilirubin: 0.6 mg/dL (ref 0.3–1.2)
Total Protein: 7 g/dL (ref 6.0–8.3)

## 2010-02-24 ENCOUNTER — Ambulatory Visit: Payer: Medicare Other | Admitting: Internal Medicine

## 2010-02-24 DIAGNOSIS — E538 Deficiency of other specified B group vitamins: Secondary | ICD-10-CM

## 2010-03-17 LAB — BASIC METABOLIC PANEL
BUN: 13 mg/dL (ref 6–23)
BUN: 8 mg/dL (ref 6–23)
CO2: 27 mEq/L (ref 19–32)
CO2: 30 mEq/L (ref 19–32)
CO2: 27 mEq/L (ref 19–32)
Calcium: 8.5 mg/dL (ref 8.4–10.5)
Chloride: 101 mEq/L (ref 96–112)
Chloride: 111 mEq/L (ref 96–112)
Creatinine, Ser: 0.9 mg/dL (ref 0.4–1.2)
Creatinine, Ser: 1.08 mg/dL (ref 0.4–1.2)
GFR calc Af Amer: 57 mL/min — ABNORMAL LOW (ref >60)
GFR calc Af Amer: >60 mL/min (ref >60)
Glucose, Bld: 126 mg/dL — ABNORMAL HIGH (ref 70–99)
Glucose, Bld: 86 mg/dL (ref 70–99)
Potassium: 4 mEq/L (ref 3.5–5.1)
Potassium: 4.3 mEq/L (ref 3.5–5.1)
Sodium: 142 mEq/L (ref 135–145)

## 2010-03-17 LAB — CBC
HCT: 27.7 % — ABNORMAL LOW (ref 36.0–46.0)
HCT: 36.2 % (ref 36.0–46.0)
Hemoglobin: 10.5 g/dL — ABNORMAL LOW (ref 12.0–15.0)
MCH: 29.4 pg (ref 26.0–34.0)
MCH: 29.5 pg (ref 26.0–34.0)
MCH: 30.4 pg (ref 26.0–34.0)
MCV: 89.6 fL (ref 78.0–100.0)
MCV: 89.6 fL (ref 78.0–100.0)
Platelets: 126 10*3/uL — ABNORMAL LOW (ref 150–400)
Platelets: 199 10*3/uL (ref 150–400)
Platelets: 135 10*3/uL — ABNORMAL LOW (ref 150–400)
RBC: 3.45 MIL/uL — ABNORMAL LOW (ref 3.87–5.11)
RDW: 11.9 % (ref 11.5–15.5)
WBC: 3.9 10*3/uL — ABNORMAL LOW (ref 4.0–10.5)

## 2010-03-17 LAB — URINE MICROSCOPIC-ADD ON

## 2010-03-17 LAB — DIFFERENTIAL
Eosinophils Absolute: 0.1 10*3/uL (ref 0.0–0.7)
Eosinophils Absolute: 0.1 10*3/uL (ref 0.0–0.7)
Lymphocytes Relative: 42 % (ref 12–46)
Lymphocytes Relative: 24 % (ref 12–46)
Lymphs Abs: 1.3 10*3/uL (ref 0.7–4.0)
Lymphs Abs: 1 10*3/uL (ref 0.7–4.0)
Monocytes Relative: 12 % (ref 3–12)
Monocytes Relative: 14 % — ABNORMAL HIGH (ref 3–12)
Neutro Abs: 1.3 10*3/uL — ABNORMAL LOW (ref 1.7–7.7)
Neutrophils Relative %: 42 % — ABNORMAL LOW (ref 43–77)
Neutrophils Relative %: 60 % (ref 43–77)

## 2010-03-17 LAB — URINALYSIS, ROUTINE W REFLEX MICROSCOPIC
Glucose, UA: NEGATIVE mg/dL
Hgb urine dipstick: NEGATIVE
Protein, ur: NEGATIVE mg/dL
Specific Gravity, Urine: 1.021 (ref 1.005–1.030)
pH: 5 (ref 5.0–8.0)

## 2010-03-17 LAB — TYPE AND SCREEN: ABO/RH(D): B POS

## 2010-03-17 LAB — SURGICAL PCR SCREEN: MRSA, PCR: NEGATIVE

## 2010-03-17 LAB — POCT CARDIAC MARKERS
Myoglobin, poc: 91.7 ng/mL (ref 12–200)
Troponin i, poc: <0.05 ng/mL (ref 0.00–0.09)

## 2010-03-17 LAB — D-DIMER, QUANTITATIVE: D-Dimer, Quant: <0.22 ug/mL-FEU (ref 0.00–0.48)

## 2010-03-24 ENCOUNTER — Other Ambulatory Visit: Payer: Self-pay | Admitting: Hematology & Oncology

## 2010-03-24 ENCOUNTER — Encounter (HOSPITAL_BASED_OUTPATIENT_CLINIC_OR_DEPARTMENT_OTHER): Payer: Medicare Other | Admitting: Hematology & Oncology

## 2010-03-24 ENCOUNTER — Encounter (INDEPENDENT_AMBULATORY_CARE_PROVIDER_SITE_OTHER): Payer: Medicare Other | Admitting: Internal Medicine

## 2010-03-24 DIAGNOSIS — R002 Palpitations: Secondary | ICD-10-CM

## 2010-03-24 DIAGNOSIS — C161 Malignant neoplasm of fundus of stomach: Secondary | ICD-10-CM

## 2010-03-24 DIAGNOSIS — G35 Multiple sclerosis: Secondary | ICD-10-CM

## 2010-03-24 DIAGNOSIS — D509 Iron deficiency anemia, unspecified: Secondary | ICD-10-CM

## 2010-03-24 DIAGNOSIS — E538 Deficiency of other specified B group vitamins: Secondary | ICD-10-CM

## 2010-03-24 LAB — CBC WITH DIFFERENTIAL (CANCER CENTER ONLY)
BASO#: 0 10*3/uL (ref 0.0–0.2)
EOS%: 2.5 % (ref 0.0–7.0)
Eosinophils Absolute: 0.1 10*3/uL (ref 0.0–0.5)
HCT: 35.1 % (ref 34.8–46.6)
HGB: 11.8 g/dL (ref 11.6–15.9)
LYMPH%: 44 % (ref 14.0–48.0)
MCH: 28.8 pg (ref 26.0–34.0)
MCHC: 33.6 g/dL (ref 32.0–36.0)
MCV: 86 fL (ref 81–101)
MONO%: 11.6 % (ref 0.0–13.0)
NEUT#: 1.2 10*3/uL — ABNORMAL LOW (ref 1.5–6.5)
NEUT%: 41.5 % (ref 39.6–80.0)
RBC: 4.1 10*6/uL (ref 3.70–5.32)

## 2010-03-24 LAB — FERRITIN: Ferritin: 165 ng/mL (ref 10–291)

## 2010-03-24 LAB — RETICULOCYTES (CHCC)
ABS Retic: 29.2 10*3/uL (ref 19.0–186.0)
RBC.: 4.17 MIL/uL (ref 3.87–5.11)
Retic Ct Pct: 0.7 % (ref 0.4–3.1)

## 2010-03-24 LAB — IRON AND TIBC: Iron: 97 ug/dL (ref 42–145)

## 2010-03-28 ENCOUNTER — Encounter (INDEPENDENT_AMBULATORY_CARE_PROVIDER_SITE_OTHER): Payer: Medicare Other

## 2010-03-28 DIAGNOSIS — R002 Palpitations: Secondary | ICD-10-CM

## 2010-04-14 ENCOUNTER — Other Ambulatory Visit: Payer: Medicare Other | Admitting: Internal Medicine

## 2010-04-28 ENCOUNTER — Ambulatory Visit: Payer: Medicare Other | Admitting: Internal Medicine

## 2010-05-20 NOTE — H&P (Signed)
Erika Murphy, Erika Murphy                            ACCOUNT NO.:  [id number]   MEDICAL RECORD NO.:  [id number]                   PATIENT TYPE:  INP   LOCATION:  3018                                 FACILITY:  [facility]   PHYSICIAN:  Deanna Artis. Sharene Skeans, M.D.           DATE OF BIRTH:  1950-07-25   DATE OF ADMISSION:  05/31/2003  DATE OF DISCHARGE:                                HISTORY & PHYSICAL   HISTORY OF PRESENT ILLNESS:  A 60 year old woman with a 14-year history of  multiple sclerosis largely axial without optic neuritis.  The patient has  had a 3-week history of low back pain with no signs of multiple sclerosis or  spondylosis on a lumbosacral spine film MRI May 19th.   The patient was placed on prednisone 40 mg a day and tapered down to 10 over  a 7-day period.  She was somewhat better but has deteriorated over the past  3 days and is unable to walk even with assistance today.   The patient does not feel her bladder fill nor does she have urgency.  She  has low back pain which is a sign of a full bladder.  She urinates without  hesitancy with relief of her back pain.  She had constipation which was  relieved, no fecal incontinence.  She has had blurred vision without  diplopia.  She has mild difficulty swallowing.  She has had a dry hacking  cough without fever.  She has cramps in her right arm.  There is no  Lhermitte's sign.   REVIEW OF SYSTEMS:  Normal appetite, decreased sleep because of low back  pain.  No sinusitis, pharyngitis, or otitis.  No shortness of breath,  productive sputum, or hemoptysis.  No chest pain, hypertension, or  palpitations.  No nausea, vomiting, or diarrhea.  No joint pain or rash.  No  diabetes or thyroid disease.  No depression or anxiety.  Neurologic review  of systems otherwise negative.  Twelve-system review otherwise negative.   PAST MEDICAL HISTORY:  1. Gastric carcinoma treated with chemotherapy and radiation in remission.  2. Chronic abdominal  pain, nonspecific.   PAST SURGICAL HISTORY:  1. A 70% gastrectomy.  2. Cesarean section.   MEDICATIONS:  1. Betaseron every other day.  2. Ditropan 5 mg b.i.d.   ALLERGIES:  None.   FAMILY HISTORY:  Father age 85 has hypertension but no serious illnesses.  Mother died of congestive heart failure at age 16.  Sister has diabetes.  No  multiple sclerosis, stroke, seizures, Alzheimer's, or Parkinson's disease.   SOCIAL HISTORY:  The patient has one child.  She lives alone.  She is  retired from [employer].  No tobacco or alcohol use.   PHYSICAL EXAMINATION:  VITAL SIGNS:  Today, pulse 84, she is afebrile, the  rest of her vital signs have not yet been taken.  EAR, NOSE, AND THROAT:  Normal.  NECK:  Supple, no bruits.  LUNGS:  Clear.  HEART:  No murmur.  Pulses normal.  ABDOMEN:  Soft, bowel sounds normal.  EXTREMITIES:  Well formed with a slightly tender right leg.  NEUROLOGICAL:  Awake, alert, fully oriented.  Normal language, memory and  orientation.  Cranial Nerves:  Round reactive pupils.  No afferent pupillary  defects.  Symmetric facial strength.  Midline tongue and uvula.  Air  conduction greater than bone conduction bilaterally.  Normal fundi.  Visual  fields full to double simultaneous stimuli.  Motor Examination:  Normal  strength  in the upper extremities, no drift.  Fine motor movements are  normal.  Lower extremities:  Hip flexors 4- left, 3 right.  Psoas 4 left, 4-  right.  Knee extensors 4 left, 2 right.  Foot dorsiflexors 4 left, 1 to2  right.  Planter flexors 5 left, 1 to 2 right.  Sensory:  Hyperesthesia right  lower extremity.  Otherwise normal sensation with good proprioception,  vibration, stereoagnosis, no sensory level.  Normal rectal tone.  Gait:  She  can walk with a cane, dragging her right leg.  Deep tendon reflexes were  brisk, no reflex predominance.  The patient has bilateral flexor plantar  responses.   IMPRESSION:  Multiple sclerosis and  relapse with transverse myelitis, no  clear cut level.   PLAN:  1. MRI of the brain and cervical spine without and with contrast.  2. Solu-Medrol 1 g per day for 5 days.  3. Laboratories will be done.  4. The patient will be seen by physical therapy.                                                Deanna Artis. Sharene Skeans, M.D.    [place]  D:  05/31/2003  T:  05/31/2003  Job:  [id number]   cc:   Genene Churn. Love, M.D.  [address]  [address]  [place]  [place] [address]  Fax: [phone]

## 2010-05-20 NOTE — Op Note (Signed)
[place]  Patient:    Erika Murphy, Erika Murphy                         MRN: [id number] Proc. Date: 04/13/00 Adm. Date:  [id number] Attending:  Arlis Porta                           Operative Report  PREOPERATIVE DIAGNOSIS:  Retained Port-A-Cath.  POSTOPERATIVE DIAGNOSIS:  Retained Port-A-Cath.  PROCEDURE:  Port-A-Cath removal.  SURGEON:  Adolph Pollack, M.D.  ANESTHESIA:  Local (1% lidocaine with epinephrine plus 0.5% plain Marcaine plus sodium bicarbonate) with MAC.  INDICATION:  This is a 60 year old female who underwent chemotherapy for a gastric cancer.  She is through with her therapy and now presents for Port-A-Cath removal.  DESCRIPTION OF PROCEDURE:  She is taken to the operating room, placed supine on the operating table, and given intravenous sedation.  The left upper chest wall area was sterilely prepped and draped.  Local anesthetic was infiltrated deep to the previous incision and around the Port-A-Cath area.  The previous incision was reincised down to the level of the platelet count.  The catheter part was freed up and removed from the vein and direct pressure held in the infraclavicular region for 10 to 15 minutes.  Using sharp dissection, the sutures were cut and the fibrous capsule was cut, freeing the Port-A-Cath, and it was removed.  Bleeding points were controlled with the cautery.  The wound was then closed in two layers by approximating the subcutaneous fat with running 4-0 Vicryl suture.  The skin was closed with 4-0 Monocryl subcuticular stitch, followed by Steri-Strips and sterile dressings.  She tolerated the procedure well without any apparent complications, was taken to recovery in satisfactory condition. DD:  04/13/00 TD:  04/13/00 Job: [id number]

## 2010-05-20 NOTE — Consult Note (Signed)
. [facility]  Patient:    Erika Murphy, Erika Murphy                         MRN: [id number] Proc. Date: 09/17/99 Adm. Date:  [id number] Disc. Date: [id number] Attending:  Jim Desanctis CC:         Rose Phi. Myna Hidalgo, M.D.   Consultation Report  HISTORY:  The patient is a pleasant 60 year old woman diagnosed with gastric cancer back in March 2001.  She was treated with surgery by Dr. Abbey Chatters. She then received chemotherapy and radiation therapy by Dr. Myna Hidalgo.  She had a brief admission here earlier this month on September 1 for abdominal pain which turned out to be nonspecific gastritis.  She received blood and electrolyte replacement at that time.  SHe is having intermittent abdominal discomfort, but not any severe pain.  No vomiting.  She is moving her bowels.  She denies any hematochezia or melena. She came to the emergency room today just feeling generally weak without any specific complaints.  She has had difficulty sleeping.  Dr. Myna Hidalgo gave her a prescription for Ambien, which really has not helped much.  PHYSICAL EXAMINATION:  GENERAL:  No acute distress.  VITAL SIGNS:  Blood pressure 117/64, respirations 16, pulse 66, temperature 97.0.  HEAD AND NECK:  Normal.  LUNGS:  Clear.  CARDIAC:  Regular rhythm.  ABDOMEN:  Soft and nontender.  A midline gastrectomy scar.  No palpable masses or organomegaly.  RECTAL:  Not done.  EXTREMITIES:  No edema.  No calf tenderness.  LABORATORY DATA:  Hemoglobin 12.3, hematocrit 35, white count 4100, platelets not recorded.  Chemistry profile is normal with BUN 7, creatinine 0.6, potassium 3.8, and normal liver functions.  IMPRESSION:  Nonspecific complaints in a lady with the recent diagnosis and treatment for stomach cancer.  SUMMARY:  I reassured the patient that her exam and labs were within normal limits.  Continue her current medications of Protonix, antispasmodics p.r.n., Tylox p.r.n.   Followup with Dr. Myna Hidalgo. DD:  09/17/99 TD:  09/17/99 Job: [id number]

## 2010-05-20 NOTE — Discharge Summary (Signed)
Erika Murphy, Erika Murphy                            ACCOUNT NO.:  [id number]   MEDICAL RECORD NO.:  [id number]                   PATIENT TYPE:  INP   LOCATION:  3018                                 FACILITY:  [facility]   PHYSICIAN:  Genene Churn. Love, M.D.                 DATE OF BIRTH:  December 16, 1950   DATE OF ADMISSION:  05/31/2003  DATE OF DISCHARGE:  06/04/2003                                 DISCHARGE SUMMARY   CHIEF COMPLAINT:  This is one of several [facility] admissions for  this 60 year old right-handed African-American female from [place], [place],  with a 14 year history of myelopathy secondary to multiple sclerosis.  She  is admitted for evaluation of increased symptoms.   HISTORY OF PRESENT ILLNESS:  This patient has a three week history of low  back pain and has had documented spondylosis on lumbar spine x-rays on May 21, 2003.  She was placed on prednisone 40 mg per day and on a taper down to  10 mg over seven days, but deteriorated three days prior to admission and  was unable to walk.  She noticed that she could not feel her bladder fill,  nor did she have any urgency.  She had some low back pain.  She had  hesitancy with urination.  She had no fecal incontinence, but did have  constipation.  There is blurred vision without diplopia.  There was no  unilateral visual loss.  She had some difficulty swallowing and a dry  hacking cough.  She was seen by Dr. Sharene Skeans in the emergency room and  admitted for further evaluation.   PAST MEDICAL HISTORY:  Gastric carcinoma, treated with chemotherapy and  radiation.  She has had chronic abdominal pain.   PAST SURGICAL HISTORY:  1. A 70% gastrectomy.  2. Cesarean section.   MEDICATIONS AT TIME OF ADMISSION:  1. Betaseron 0.3 mg every other day subcutaneously.  2. Ditropan 5 mg p.o. b.i.d.   PHYSICAL EXAMINATION:  VITAL SIGNS:  Normal vital signs.  GENERAL:  She was awake, alert, and oriented x3.  Mild dysarthria.  Tall,  thin  lady with kyphosis.  NEUROLOGIC:  Mild bifacial weakness, but symmetric.  Tongue midline, uvula  midline, gags present.  She had good strength in the upper and lower  extremities.  She had 4/5 strength in her left leg, and 3 to 4/5 to 2/5 in  her right leg with 3+ reflexes, downgoing plantar responses, hyperesthesia  to pinprick in her right lower extremity.  She could stand with a cane.  She  could walk a few steps, but drug her right leg.   LABORATORY DATA:  White blood cell count 5500, hemoglobin 11.1, hematocrit  33.5, platelet count 155,000, with repeat on June 04, 2003:  White blood cell  count 7400, hemoglobin 9.9, hematocrit 30.1, platelet count 140,000.  Sedimentation rate 25.  Initial sodium  137, potassium 4.1, chloride 104, CO2  content 28, glucose 124, BUN 12, creatinine 1, total bilirubin 0.9, alkaline  phosphatase 104, SGOT 24, SGPT 25, total protein 7, albumin 3.4, calcium 8.  Repeat comprehensive metabolic panel with a sodium of 141, potassium 4.4,  chloride 104, CO2 content 29, glucose 164, BUN 19, creatinine 1.2, total  bilirubin 0.8, alkaline phosphatase 91, SGOT 27, SGPT 25, total protein 6.6,  albumin 3.1, calcium 8.9.  Urinalysis unremarkable.  ANA titer positive with  a titer of 1:60 B-speckled pattern.  This was a weakly positive ANA.  EKG  was not performed.  Her chest x-ray showed no active disease.  MRI study of  the brain with and without contrast showed negative for metastatic disease,  no acute infarctions, she had small white matter lesions noted bilaterally,  they were non-specific and similar to report on February 2003.  MRI of the  cervical spine showed a cord lesion at C5-6 ventrally, the cord was not  expanded, and the lesion did not enhance.  There was a similar lesion at the  T2-3 level that did not enlarge the cord and did not enhance either.  There  was some mild to moderate advanced degenerative disk disease and  spondylosis.  There was some disk  bulging and central osteophyte formation  at C4-5.  This was noted to have an AP diameter of 7 mm.   HOSPITAL COURSE:  The patient was admitted and had significant improvement  in weakness with high dose IV Solu-Medrol.  She was seen by physical  therapy.  She noted a bad taste in her mouth, was found to have evidence of  thrush.  On the day of discharge, she was able to walk with her walker, drug  both feet, had 4+/5 strength of the left leg, and 4/5 strength in the right  leg.  Could stand on her toes, could stand on her heels.   IMPRESSION:  1. Multiple sclerosis, code 340.  2. History of gastric tumor, code unknown.  3. Thrush.   PLAN:  Have physical therapy and occupational therapy as an outpatient,  begin Diflucan 200 mg daily x2 days, use Nystatin mouthwash x2 weeks, begin  prednisone taper and use Zantac as an outpatient.   DISCHARGE MEDICATIONS:  1. Prednisone 60 mg daily x4 days, then 40 mg daily x4 days, then 20 mg     daily x5 days.  2. Zantac 150 mg b.i.d.  3. Diflucan 200 mg p.o. b.i.d. x1 more day.  4. Ditropan 500 mg b.i.d.  5. Betaseron 0.2 mg subcutaneously every other day.  6. Nystatin 100,000 units per cc, one teaspoonful q.i.d., swish and swallow,     hold in mouth as long as possible.  7. ____________10 mg q.i.d. p.r.n. cramps.  8. Robitussin p.r.n.   FOLLOWUP:  She will return to the office in two weeks for a follow up  evaluation.   CONDITION ON DISCHARGE:  Discharged improved from her pre-hospital status.                                                Genene Churn. Sandria Manly, M.D.    [facility]  D:  06/04/2003  T:  06/04/2003  Job:  [id number]   cc:   Luanna Cole. Lenord Fellers, M.D.  [address], [place]  [place]  [place] [address]  Fax:  [phone]   Griffith Citron, M.D.  [facility] [place]  [place] [address]  Fax: [phone]

## 2010-06-02 ENCOUNTER — Ambulatory Visit (INDEPENDENT_AMBULATORY_CARE_PROVIDER_SITE_OTHER): Payer: Medicare Other | Admitting: Internal Medicine

## 2010-06-02 ENCOUNTER — Encounter: Payer: Self-pay | Admitting: Internal Medicine

## 2010-06-02 DIAGNOSIS — E538 Deficiency of other specified B group vitamins: Secondary | ICD-10-CM | POA: Insufficient documentation

## 2010-06-02 DIAGNOSIS — J329 Chronic sinusitis, unspecified: Secondary | ICD-10-CM

## 2010-06-02 DIAGNOSIS — J309 Allergic rhinitis, unspecified: Secondary | ICD-10-CM

## 2010-06-02 DIAGNOSIS — M949 Disorder of cartilage, unspecified: Secondary | ICD-10-CM

## 2010-06-02 DIAGNOSIS — G35 Multiple sclerosis: Secondary | ICD-10-CM

## 2010-06-02 DIAGNOSIS — D002 Carcinoma in situ of stomach: Secondary | ICD-10-CM

## 2010-06-02 DIAGNOSIS — N319 Neuromuscular dysfunction of bladder, unspecified: Secondary | ICD-10-CM

## 2010-06-02 DIAGNOSIS — M899 Disorder of bone, unspecified: Secondary | ICD-10-CM

## 2010-06-02 DIAGNOSIS — M8580 Other specified disorders of bone density and structure, unspecified site: Secondary | ICD-10-CM

## 2010-06-02 MED ORDER — PREDNISONE 10 MG PO KIT
PACK | ORAL | Status: DC
Start: 2010-06-02 — End: 2011-01-31

## 2010-06-02 MED ORDER — CYANOCOBALAMIN 1000 MCG/ML IJ SOLN
1000.0000 ug | Freq: Once | INTRAMUSCULAR | Status: AC
  Administered 2010-06-02: 1000 ug via INTRAMUSCULAR

## 2010-06-02 MED ORDER — AZITHROMYCIN 250 MG PO TABS: 250.0000 mg | ORAL_TABLET | Freq: Every day | ORAL | Status: DC

## 2010-06-02 NOTE — Progress Notes (Signed)
  Subjective:    Patient ID: Erika Murphy, female    DOB: 08/19/1950, 60 y.o.   MRN: [id number]  HPI Brother died recently in early 21-May-2022 of metatstatic liver cancer. She had a lot of preparations to make to bury him. Has been extremely fatigued and lethargic. Some upper respiratory congestion. Sleeping a lot-thinks it is more than a just a grief reaction. reninded her TSH in March was normal. No cough but feels a bit tight in her chest and back is burning some. Good relief with Lyrica. History of Multiple Sclerosis and carcinoma of the stomach. Hx B12 def related to gastrectomy. B12 injection given today.    Review of Systems     Objective:   Physical Exam Boggy nasal mucosa. TMs clear with good light reflex. Pharynx slightly injected. No exudate. Neck is supple. Chest is clear. Pulse ox is 97% on room air.        Assessment & Plan:  1- Allergic Rhinitis 2-Sinusitis 3-Multiple sclerosis 4-Grief Reaction Plan is [id number] Z-Pak take as directed, Sterapred DS 10mg  6 day dosepack and reassure pt. RTC in 4 weeks for B12 injection

## 2010-06-02 NOTE — Patient Instructions (Signed)
Take meds as directed. Return in 4 weeks for monthly B12 injection

## 2010-06-03 ENCOUNTER — Other Ambulatory Visit: Payer: Self-pay | Admitting: *Deleted

## 2010-06-03 DIAGNOSIS — R52 Pain, unspecified: Secondary | ICD-10-CM

## 2010-06-03 MED ORDER — PREGABALIN 50 MG PO CAPS: 50.0000 mg | ORAL_CAPSULE | Freq: Three times a day (TID) | ORAL | Status: DC

## 2010-06-06 ENCOUNTER — Telehealth: Payer: Self-pay | Admitting: Internal Medicine

## 2010-06-06 NOTE — Telephone Encounter (Signed)
I signed order for this . Prn one year refills. Please call drug store.

## 2010-06-15 DIAGNOSIS — Z0000 Encounter for general adult medical examination without abnormal findings: Secondary | ICD-10-CM

## 2010-06-16 DIAGNOSIS — Z0000 Encounter for general adult medical examination without abnormal findings: Secondary | ICD-10-CM

## 2010-06-21 ENCOUNTER — Emergency Department (HOSPITAL_COMMUNITY)
Admission: EM | Admit: 2010-06-21 | Discharge: 2010-06-21 | Disposition: A | Discharge status: Home / Self Care | Payer: Medicare Other | Attending: Emergency Medicine | Admitting: Emergency Medicine

## 2010-06-21 DIAGNOSIS — G35 Multiple sclerosis: Secondary | ICD-10-CM | POA: Insufficient documentation

## 2010-06-21 DIAGNOSIS — R51 Headache: Secondary | ICD-10-CM | POA: Insufficient documentation

## 2010-06-23 ENCOUNTER — Telehealth: Payer: Self-pay | Admitting: Internal Medicine

## 2010-06-27 NOTE — Telephone Encounter (Signed)
Appointment scheduled with Dr. Sandria Manly tomorrow at 1:30.  Pt notified.

## 2010-06-30 ENCOUNTER — Ambulatory Visit: Payer: Medicare Other | Admitting: Internal Medicine

## 2010-07-08 ENCOUNTER — Encounter: Payer: Self-pay | Admitting: Internal Medicine

## 2010-07-08 ENCOUNTER — Ambulatory Visit (INDEPENDENT_AMBULATORY_CARE_PROVIDER_SITE_OTHER): Payer: Medicare Other | Admitting: Internal Medicine

## 2010-07-08 VITALS — BP 136/78 | Temp 97.3°F | Wt 91.5 lb

## 2010-07-08 DIAGNOSIS — F411 Generalized anxiety disorder: Secondary | ICD-10-CM

## 2010-07-08 DIAGNOSIS — G35 Multiple sclerosis: Secondary | ICD-10-CM

## 2010-07-08 DIAGNOSIS — E538 Deficiency of other specified B group vitamins: Secondary | ICD-10-CM

## 2010-07-08 DIAGNOSIS — F419 Anxiety disorder, unspecified: Secondary | ICD-10-CM | POA: Insufficient documentation

## 2010-07-08 DIAGNOSIS — C169 Malignant neoplasm of stomach, unspecified: Secondary | ICD-10-CM

## 2010-07-08 DIAGNOSIS — M2669 Other specified disorders of temporomandibular joint: Secondary | ICD-10-CM

## 2010-07-08 DIAGNOSIS — M26629 Arthralgia of temporomandibular joint, unspecified side: Secondary | ICD-10-CM

## 2010-07-08 MED ORDER — CYANOCOBALAMIN 1000 MCG/ML IJ SOLN
1000.0000 ug | Freq: Once | INTRAMUSCULAR | Status: AC
  Administered 2010-07-08: 1000 ug via INTRAMUSCULAR

## 2010-07-08 NOTE — Patient Instructions (Signed)
Take Valium up to 3 times daily as needed for TMJ syndrome/headache associated with it. Return in 4 weeks for B12 injection.

## 2010-07-08 NOTE — Progress Notes (Signed)
  Subjective:    Patient ID: Erika Murphy, female    DOB: 1950/05/12, 60 y.o.   MRN: [id number]  HPI patient here to followup on headaches and multiple sclerosis symptoms. She saw a neurologist recently. She says she thought she was doing very well. She has taken a course of prednisone for the headaches. Says that almost on a daily basis there is some pressure around her right temple. On further inspection this seems to be tenderness around the right TM joint. Has popping of the right TM joint when she opens her mouth. Says that taking Valium seems to help relieve some of the discomfort around her temple area. Unfortunately, we have had considerable issues getting her insurance plan to approve Lyrica. We were on the phone today for an hour at and a half trying to get approval. Also I had written an appeal letter. She really feels a Lyrica makes a great deal of difference in her symptoms. She can sleep well and move better with it. It is very important that we try to get this approved. She has been unable to tolerate Neurontin previously because of GI side effects. She says she tried to explain that to [employer] but they would not listen. Apparently Neurontin was tried when she was insured with [employer] and not [employer] as she is now. She saw ophthalmologist today, Dr. Elmer Picker. She had her eyes dilated and a thorough eye exam. Was told she had cataracts. She walks with a cane and is quite thin.    Review of Systems     Objective:   Physical Exam significant tenderness right TM joint with popping when she opens her mouth. Some popping when she opens her mouth and the left TM joint but it is not tender. Chest clear cardiac exam regular rate and rhythm funduscopic exam no significant retinopathy.        Assessment & Plan:  Right TMJ syndrome  Headaches secondary to right TMJ syndrome  Multiple sclerosis  History of adenocarcinoma of the stomach  B12 deficiency secondary to gastric  resection  B12 injection given today 1 cc IM & return in 4 weeks for repeat B12 injection. Recommend Valium sparingly 3 times a day for right TMJ syndrome. Try to get approval of Lyrica from insurance company

## 2010-07-14 ENCOUNTER — Telehealth: Payer: Self-pay | Admitting: Internal Medicine

## 2010-07-15 ENCOUNTER — Telehealth: Payer: Self-pay

## 2010-07-15 NOTE — Telephone Encounter (Signed)
Advised dr of pt's decision to switch insurance carriers for better coverage

## 2010-07-18 ENCOUNTER — Telehealth: Payer: Self-pay | Admitting: Internal Medicine

## 2010-07-18 NOTE — Telephone Encounter (Signed)
Pt does not think this related to a strain.  She is going to take some Ibuprofen and call back tomorrow is symptoms do not improve.

## 2010-07-18 NOTE — Telephone Encounter (Signed)
I doubt this is related to Lyrica. Could she have strained her foot over the weekend? Recommend some Advil or Aleve and can see if does not improve.

## 2010-07-27 NOTE — Telephone Encounter (Signed)
Medication refilled as per orders

## 2010-09-19 ENCOUNTER — Other Ambulatory Visit: Payer: Self-pay | Admitting: Hematology & Oncology

## 2010-09-19 ENCOUNTER — Encounter (HOSPITAL_BASED_OUTPATIENT_CLINIC_OR_DEPARTMENT_OTHER): Payer: Medicare Other | Admitting: Hematology & Oncology

## 2010-09-19 DIAGNOSIS — D649 Anemia, unspecified: Secondary | ICD-10-CM

## 2010-09-19 DIAGNOSIS — G35 Multiple sclerosis: Secondary | ICD-10-CM

## 2010-09-19 DIAGNOSIS — D509 Iron deficiency anemia, unspecified: Secondary | ICD-10-CM

## 2010-09-19 DIAGNOSIS — IMO0001 Reserved for inherently not codable concepts without codable children: Secondary | ICD-10-CM

## 2010-09-19 DIAGNOSIS — E559 Vitamin D deficiency, unspecified: Secondary | ICD-10-CM

## 2010-09-19 DIAGNOSIS — C161 Malignant neoplasm of fundus of stomach: Secondary | ICD-10-CM

## 2010-09-19 LAB — CBC WITH DIFFERENTIAL (CANCER CENTER ONLY)
BASO%: 0.7 % (ref 0.0–2.0)
Eosinophils Absolute: 0.1 10*3/uL (ref 0.0–0.5)
HCT: 39.1 % (ref 34.8–46.6)
LYMPH#: 1.2 10*3/uL (ref 0.9–3.3)
MONO#: 0.4 10*3/uL (ref 0.1–0.9)
Platelets: 180 10*3/uL (ref 145–400)
RBC: 4.36 10*6/uL (ref 3.70–5.32)
RDW: 11.9 % (ref 11.1–15.7)
WBC: 2.7 10*3/uL — ABNORMAL LOW (ref 3.9–10.0)

## 2010-09-19 LAB — CHCC SATELLITE - SMEAR

## 2010-09-20 LAB — COMPREHENSIVE METABOLIC PANEL
AST: 30 U/L (ref 0–37)
Albumin: 4.2 g/dL (ref 3.5–5.2)
BUN: 18 mg/dL (ref 6–23)
CO2: 29 mEq/L (ref 19–32)
Calcium: 9.8 mg/dL (ref 8.4–10.5)
Chloride: 104 mEq/L (ref 96–112)
Creatinine, Ser: 1.17 mg/dL — ABNORMAL HIGH (ref 0.50–1.10)
Potassium: 3.6 mEq/L (ref 3.5–5.3)

## 2010-09-20 LAB — FERRITIN: Ferritin: 91 ng/mL (ref 10–291)

## 2010-09-20 LAB — IRON AND TIBC
TIBC: 376 ug/dL (ref 250–470)
UIBC: 259 ug/dL (ref 125–400)

## 2010-09-20 LAB — RETICULOCYTES (CHCC): RBC.: 4.45 MIL/uL (ref 3.87–5.11)

## 2010-10-06 ENCOUNTER — Ambulatory Visit (INDEPENDENT_AMBULATORY_CARE_PROVIDER_SITE_OTHER): Payer: Medicare Other | Admitting: Internal Medicine

## 2010-10-06 DIAGNOSIS — D649 Anemia, unspecified: Secondary | ICD-10-CM

## 2010-10-06 MED ORDER — CYANOCOBALAMIN 1000 MCG/ML IJ SOLN
1000.0000 ug | Freq: Once | INTRAMUSCULAR | Status: AC
  Administered 2010-10-06: 1000 ug via INTRAMUSCULAR

## 2010-10-06 NOTE — Progress Notes (Signed)
Per orders of Dr. Lenord Fellers, injection of Cyanocobalamin given by Chelsea Aus L. Patient instructed to remain in clinic for 20 minutes afterwards, and to report any adverse reaction to me immediately.

## 2010-11-03 ENCOUNTER — Encounter: Payer: Self-pay | Admitting: Internal Medicine

## 2010-11-03 ENCOUNTER — Ambulatory Visit: Payer: Medicare Other | Admitting: Internal Medicine

## 2010-11-03 ENCOUNTER — Ambulatory Visit (INDEPENDENT_AMBULATORY_CARE_PROVIDER_SITE_OTHER): Payer: Medicare Other | Admitting: Internal Medicine

## 2010-11-03 DIAGNOSIS — G35 Multiple sclerosis: Secondary | ICD-10-CM

## 2010-11-03 DIAGNOSIS — Z23 Encounter for immunization: Secondary | ICD-10-CM

## 2010-11-03 DIAGNOSIS — J309 Allergic rhinitis, unspecified: Secondary | ICD-10-CM

## 2010-11-03 DIAGNOSIS — E538 Deficiency of other specified B group vitamins: Secondary | ICD-10-CM

## 2010-11-03 DIAGNOSIS — H6500 Acute serous otitis media, unspecified ear: Secondary | ICD-10-CM

## 2010-11-03 DIAGNOSIS — Z85028 Personal history of other malignant neoplasm of stomach: Secondary | ICD-10-CM

## 2010-11-03 MED ORDER — METHYLPREDNISOLONE ACETATE 80 MG/ML IJ SUSP
80.0000 mg | Freq: Once | INTRAMUSCULAR | Status: AC
  Administered 2010-11-04: 80 mg via INTRAMUSCULAR

## 2010-11-03 MED ORDER — CYANOCOBALAMIN 1000 MCG/ML IJ SOLN
1000.0000 ug | Freq: Once | INTRAMUSCULAR | Status: AC
  Administered 2010-11-04: 1000 ug via INTRAMUSCULAR

## 2010-11-03 NOTE — Progress Notes (Signed)
Addended by: Judy Pimple on: 11/03/2010 05:13 PM   Modules accepted: Orders

## 2010-11-03 NOTE — Progress Notes (Signed)
  Subjective:    Patient ID: Erika Murphy, female    DOB: 12/03/1950, 60 y.o.   MRN: [id number]  HPI patient complaining of ear congestion. Says ears are popping. Has also been concerned about her memory recently. Wants when she was taking Lyrica and some over-the-counter antihistamine she felt like she had some memory issues develop for a day or so. Explained to her that the antihistamines in Lyrica could cause significant drowsiness and may have caused her to have some forgetfulness.    Review of Systems     Objective:   Physical Exam. Boggy nasal mucosa. TMs are full bilaterally but not red. Neck is supple. Chest clear        Assessment & Plan:  Serous otitis media  Allergic rhinitis  Multiple sclerosis  B12 deficiency due to gastrectomy for carcinoma the stop  Plan: Given influenza immunization today and B12 injection which she gets monthly  For serous otitis media which is likely related to allergy symptoms, given Depo-Medrol 80 mg IM

## 2010-12-02 ENCOUNTER — Ambulatory Visit (INDEPENDENT_AMBULATORY_CARE_PROVIDER_SITE_OTHER): Payer: Medicare Other | Admitting: Internal Medicine

## 2010-12-02 DIAGNOSIS — E538 Deficiency of other specified B group vitamins: Secondary | ICD-10-CM

## 2010-12-02 MED ORDER — CYANOCOBALAMIN 1000 MCG/ML IJ SOLN
1000.0000 ug | Freq: Once | INTRAMUSCULAR | Status: AC
  Administered 2010-12-02: 1000 ug via INTRAMUSCULAR

## 2010-12-02 MED ORDER — CYANOCOBALAMIN 1000 MCG/ML IJ SOLN: 1000.0000 ug | Freq: Once | INTRAMUSCULAR | Status: AC

## 2010-12-02 NOTE — Progress Notes (Signed)
  Subjective:    Patient ID: Erika Murphy, female    DOB: 1950/06/21, 60 y.o.   MRN: [id number]  HPI    Review of Systems     Objective:   Physical Exam        Assessment & Plan:

## 2010-12-30 ENCOUNTER — Ambulatory Visit (INDEPENDENT_AMBULATORY_CARE_PROVIDER_SITE_OTHER): Payer: Medicare Other | Admitting: Internal Medicine

## 2010-12-30 DIAGNOSIS — E538 Deficiency of other specified B group vitamins: Secondary | ICD-10-CM

## 2010-12-30 MED ORDER — CYANOCOBALAMIN 1000 MCG/ML IJ SOLN
1000.0000 ug | Freq: Once | INTRAMUSCULAR | Status: AC
  Administered 2010-12-30: 1000 ug via INTRAMUSCULAR

## 2011-01-31 ENCOUNTER — Encounter: Payer: Self-pay | Admitting: Internal Medicine

## 2011-01-31 ENCOUNTER — Ambulatory Visit (INDEPENDENT_AMBULATORY_CARE_PROVIDER_SITE_OTHER): Payer: Medicare Other | Admitting: Internal Medicine

## 2011-01-31 ENCOUNTER — Other Ambulatory Visit: Payer: Self-pay

## 2011-01-31 VITALS — BP 132/68 | Temp 98.5°F | Wt 100.0 lb

## 2011-01-31 DIAGNOSIS — E538 Deficiency of other specified B group vitamins: Secondary | ICD-10-CM | POA: Diagnosis not present

## 2011-01-31 DIAGNOSIS — G35 Multiple sclerosis: Secondary | ICD-10-CM

## 2011-01-31 DIAGNOSIS — Z85028 Personal history of other malignant neoplasm of stomach: Secondary | ICD-10-CM | POA: Diagnosis not present

## 2011-01-31 DIAGNOSIS — A499 Bacterial infection, unspecified: Secondary | ICD-10-CM | POA: Diagnosis not present

## 2011-01-31 DIAGNOSIS — Z86003 Personal history of in-situ neoplasm of oral cavity, esophagus and stomach: Secondary | ICD-10-CM

## 2011-01-31 DIAGNOSIS — N760 Acute vaginitis: Secondary | ICD-10-CM

## 2011-01-31 DIAGNOSIS — R52 Pain, unspecified: Secondary | ICD-10-CM

## 2011-01-31 DIAGNOSIS — B9689 Other specified bacterial agents as the cause of diseases classified elsewhere: Secondary | ICD-10-CM

## 2011-01-31 LAB — POCT WET PREP (WET MOUNT): Clue Cells Wet Prep HPF POC: POSITIVE

## 2011-01-31 LAB — POCT URINALYSIS DIPSTICK
Bilirubin, UA: NEGATIVE
Blood, UA: NEGATIVE
Glucose, UA: NEGATIVE
Ketones, UA: NEGATIVE
Leukocytes, UA: NEGATIVE
Nitrite, UA: NEGATIVE
Protein, UA: NEGATIVE
Spec Grav, UA: 1.01
Urobilinogen, UA: NEGATIVE
pH, UA: 6

## 2011-01-31 MED ORDER — CYANOCOBALAMIN 1000 MCG/ML IJ SOLN
1000.0000 ug | Freq: Once | INTRAMUSCULAR | Status: AC
  Administered 2011-01-31: 1000 ug via INTRAMUSCULAR

## 2011-01-31 MED ORDER — CYANOCOBALAMIN 1000 MCG/ML IJ SOLN: 1000.0000 ug | Freq: Once | INTRAMUSCULAR | Status: DC

## 2011-01-31 MED ORDER — PREGABALIN 50 MG PO CAPS: 50.0000 mg | ORAL_CAPSULE | Freq: Three times a day (TID) | ORAL | Status: DC

## 2011-01-31 NOTE — Progress Notes (Signed)
  Subjective:    Patient ID: Erika Murphy, female    DOB: [birth year], 61 y.o.   MRN: [id number]  HPI  In today for monthly B12 injection. Complaining of vaginal odor with some discharge. Is postmenopausal. Not sexually active. No UTI symptoms. History of neurogenic bladder related to multiple sclerosis.    Review of Systems     Objective:   Physical Exam urinalysis is normal. Patient has atrophic vaginitis. Scant white vaginal discharge. Wet prep shows clue cells and white blood cells.        Assessment & Plan:  Bacterial vaginosis  B12 deficiency secondary to gastrectomy for carcinoma  Multiple sclerosis  Plan: Flagyl 500 mg twice daily for 7 days followed by vinegar and water douche. Return in 4 weeks for B12 injection.

## 2011-01-31 NOTE — Patient Instructions (Signed)
Take Flagyl 500 mg daily for 7 days. Been using vinegar and water douche wants. Call if symptoms return. B12 injection given today. Return in one month for B12 injection

## 2011-01-31 NOTE — Progress Notes (Signed)
Addended by: Judy Pimple on: 01/31/2011 02:58 PM   Modules accepted: Orders

## 2011-02-28 ENCOUNTER — Ambulatory Visit (INDEPENDENT_AMBULATORY_CARE_PROVIDER_SITE_OTHER): Payer: Medicare Other | Admitting: Internal Medicine

## 2011-02-28 DIAGNOSIS — E539 Vitamin B deficiency, unspecified: Secondary | ICD-10-CM

## 2011-02-28 MED ORDER — CYANOCOBALAMIN 1000 MCG/ML IJ SOLN
1000.0000 ug | Freq: Once | INTRAMUSCULAR | Status: AC
  Administered 2011-02-28: 1000 ug via INTRAMUSCULAR

## 2011-03-13 ENCOUNTER — Other Ambulatory Visit (HOSPITAL_BASED_OUTPATIENT_CLINIC_OR_DEPARTMENT_OTHER): Payer: Medicare Other | Admitting: Lab

## 2011-03-13 ENCOUNTER — Ambulatory Visit (HOSPITAL_BASED_OUTPATIENT_CLINIC_OR_DEPARTMENT_OTHER): Payer: Medicare Other | Admitting: Hematology & Oncology

## 2011-03-13 ENCOUNTER — Telehealth: Payer: Self-pay | Admitting: Internal Medicine

## 2011-03-13 DIAGNOSIS — C187 Malignant neoplasm of sigmoid colon: Secondary | ICD-10-CM

## 2011-03-13 DIAGNOSIS — D002 Carcinoma in situ of stomach: Secondary | ICD-10-CM

## 2011-03-13 DIAGNOSIS — C161 Malignant neoplasm of fundus of stomach: Secondary | ICD-10-CM | POA: Diagnosis not present

## 2011-03-13 DIAGNOSIS — D649 Anemia, unspecified: Secondary | ICD-10-CM

## 2011-03-13 DIAGNOSIS — D509 Iron deficiency anemia, unspecified: Secondary | ICD-10-CM | POA: Diagnosis not present

## 2011-03-13 DIAGNOSIS — E538 Deficiency of other specified B group vitamins: Secondary | ICD-10-CM

## 2011-03-13 LAB — CBC WITH DIFFERENTIAL (CANCER CENTER ONLY)
BASO#: 0 10*3/uL (ref 0.0–0.2)
BASO%: 0.6 % (ref 0.0–2.0)
EOS%: 3.2 % (ref 0.0–7.0)
HGB: 12 g/dL (ref 11.6–15.9)
LYMPH#: 1.3 10*3/uL (ref 0.9–3.3)
MCHC: 32.5 g/dL (ref 32.0–36.0)
NEUT#: 1.3 10*3/uL — ABNORMAL LOW (ref 1.5–6.5)
WBC: 3.1 10*3/uL — ABNORMAL LOW (ref 3.9–10.0)

## 2011-03-13 LAB — FERRITIN: Ferritin: 55 ng/mL (ref 10–291)

## 2011-03-13 LAB — COMPREHENSIVE METABOLIC PANEL
ALT: 13 U/L (ref 0–35)
AST: 23 U/L (ref 0–37)
Albumin: 4.1 g/dL (ref 3.5–5.2)
BUN: 13 mg/dL (ref 6–23)
Calcium: 9.3 mg/dL (ref 8.4–10.5)
Chloride: 106 mEq/L (ref 96–112)
Potassium: 3.7 mEq/L (ref 3.5–5.3)

## 2011-03-13 MED ORDER — LANSOPRAZOLE 30 MG PO CPDR: 30.0000 mg | DELAYED_RELEASE_CAPSULE | Freq: Every day | ORAL | Status: DC

## 2011-03-13 NOTE — Progress Notes (Signed)
CC:   Luanna Cole. Lenord Fellers, M.D. Genene Churn. Love, M.D.  DIAGNOSES: 1. Stage II (T3 N0 M0) adenocarcinoma of the stomach. 2. Recurrent iron deficiency anemia. 3. Multiple sclerosis/fibromyalgia.  CURRENT THERAPY:  Observation.  INTERIM HISTORY:  Erika Murphy comes in for followup.  She is really looking good.  Her weight is now up over 100 pound.  She attributes this to Lyrica.  Dr. Lenord Fellers put her on some Lyrica because of the fibromyalgia. She says this has been a wonderful medicine for her.  She really feels a whole lot better now.  She is not having the aches and pains that she used to have.  She is eating well.  The Lyrica has increased her appetite.  She patient has had no bleeding.  She has had no cough.  She has had no leg swelling.  She has not noticed any rashes.  When I last saw her in September 2012, her iron saturation was 31%.  Her ferritin was 91.  PHYSICAL EXAM:  General: This is a thin, but fairly well-nourished [place] American female in no obvious distress.  Her vital signs show a temperature of 97.8, pulse 69, respiratory rate 14, blood pressure 152/80, and weight is 102.  Head and neck exam shows a normocephalic, atraumatic skull.  There are no ocular or oral lesions.  There is no scleral icterus.  There is no adenopathy in her neck.  Lungs are clear bilaterally.  Cardiac exam: Regular rate and rhythm with a normal S1 and S2.  There are no murmurs, rubs, or bruits.  Abdominal exam: Soft with good bowel sounds.  She has a regular well-healed laparotomy wound. There is no fluid wave.  There is no palpable hepatosplenomegaly.  Back exam:  No tenderness over the spine, ribs, or hips.  Extremities: Shows no clubbing, no cyanosis, or edema.  Neurological exam: Shows no focal neurological deficits.  Skin exam: No rashes, ecchymosis, or petechia.  LABORATORY STUDIES:  White cell count is 3.1, hemoglobin 12, hematocrit 37, and platelet count 158.  MCV is 92.  IMPRESSION:  Ms.  Tessler is a 61 year old African American female with a past history of localized gastric cancer.  She underwent resection back in 2001.  We gave her adjuvant chemoradiation therapy.  So far, there has been no evidence of recurrence.  I forgot to mention that we did do iron studies on her today.  Her ferritin was 55.  Iron saturation was 28%.  I think that given her hemoglobin, that we do not need to give her iron right now.  We will go ahead and plan to get her back in 6 more months for followup. Again, she does require IV iron as she obviously is not able to absorb iron given her past surgical history.  I am just glad to see that she is gaining weight and having a good quality of life.    ______________________________ Josph Macho, M.D. PRE/MEDQ  D:  03/13/2011  T:  03/13/2011  Job:  [id number]

## 2011-03-13 NOTE — Telephone Encounter (Signed)
Please refill for one year Prevacid 30 mg daily

## 2011-03-13 NOTE — Progress Notes (Signed)
This office note has been dictated.

## 2011-03-28 ENCOUNTER — Ambulatory Visit (INDEPENDENT_AMBULATORY_CARE_PROVIDER_SITE_OTHER): Payer: Medicare Other | Admitting: Internal Medicine

## 2011-03-28 DIAGNOSIS — E538 Deficiency of other specified B group vitamins: Secondary | ICD-10-CM

## 2011-03-28 MED ORDER — CYANOCOBALAMIN 1000 MCG/ML IJ SOLN
1000.0000 ug | Freq: Once | INTRAMUSCULAR | Status: AC
  Administered 2011-03-28: 1000 ug via INTRAMUSCULAR

## 2011-04-12 ENCOUNTER — Other Ambulatory Visit: Payer: Self-pay | Admitting: Internal Medicine

## 2011-04-12 DIAGNOSIS — Z1231 Encounter for screening mammogram for malignant neoplasm of breast: Secondary | ICD-10-CM

## 2011-04-19 ENCOUNTER — Ambulatory Visit (HOSPITAL_COMMUNITY)
Admission: RE | Admit: 2011-04-19 | Discharge: 2011-04-19 | Disposition: A | Discharge status: Home / Self Care | Payer: Medicare Other | Source: Ambulatory Visit | Attending: Internal Medicine | Admitting: Internal Medicine

## 2011-04-19 DIAGNOSIS — Z1231 Encounter for screening mammogram for malignant neoplasm of breast: Secondary | ICD-10-CM

## 2011-04-25 ENCOUNTER — Ambulatory Visit: Payer: Medicare Other | Admitting: Internal Medicine

## 2011-04-27 ENCOUNTER — Ambulatory Visit: Payer: Medicare Other | Admitting: Internal Medicine

## 2011-05-22 ENCOUNTER — Telehealth: Payer: Self-pay

## 2011-05-23 ENCOUNTER — Ambulatory Visit: Payer: Medicare Other | Admitting: Internal Medicine

## 2011-05-23 NOTE — Telephone Encounter (Signed)
Patient inquiring if she could possibly give herself B12 injections at home, as she use to give herself Betaseron. Not advisable per Dr. Lenord Fellers, as the med needs to be injected deep IM.

## 2011-05-26 ENCOUNTER — Ambulatory Visit (INDEPENDENT_AMBULATORY_CARE_PROVIDER_SITE_OTHER): Payer: Medicare Other | Admitting: Internal Medicine

## 2011-05-26 DIAGNOSIS — E538 Deficiency of other specified B group vitamins: Secondary | ICD-10-CM | POA: Diagnosis not present

## 2011-05-26 MED ORDER — CYANOCOBALAMIN 1000 MCG/ML IJ SOLN
1000.0000 ug | Freq: Once | INTRAMUSCULAR | Status: AC
  Administered 2011-05-26: 1000 ug via INTRAMUSCULAR

## 2011-06-20 ENCOUNTER — Ambulatory Visit (INDEPENDENT_AMBULATORY_CARE_PROVIDER_SITE_OTHER): Payer: Medicare Other | Admitting: Internal Medicine

## 2011-06-20 ENCOUNTER — Encounter: Payer: Self-pay | Admitting: Internal Medicine

## 2011-06-20 VITALS — BP 136/74 | HR 76 | Temp 98.1°F | Ht 64.5 in | Wt 104.0 lb

## 2011-06-20 DIAGNOSIS — R5381 Other malaise: Secondary | ICD-10-CM

## 2011-06-20 DIAGNOSIS — E538 Deficiency of other specified B group vitamins: Secondary | ICD-10-CM

## 2011-06-20 DIAGNOSIS — C169 Malignant neoplasm of stomach, unspecified: Secondary | ICD-10-CM

## 2011-06-20 DIAGNOSIS — G35 Multiple sclerosis: Secondary | ICD-10-CM

## 2011-06-20 DIAGNOSIS — R5383 Other fatigue: Secondary | ICD-10-CM

## 2011-06-20 DIAGNOSIS — Z862 Personal history of diseases of the blood and blood-forming organs and certain disorders involving the immune mechanism: Secondary | ICD-10-CM

## 2011-06-20 DIAGNOSIS — G35D Multiple sclerosis, unspecified: Secondary | ICD-10-CM

## 2011-06-20 DIAGNOSIS — Z8639 Personal history of other endocrine, nutritional and metabolic disease: Secondary | ICD-10-CM

## 2011-06-20 DIAGNOSIS — F959 Tic disorder, unspecified: Secondary | ICD-10-CM

## 2011-06-20 DIAGNOSIS — H5710 Ocular pain, unspecified eye: Secondary | ICD-10-CM

## 2011-06-20 LAB — POCT URINALYSIS DIPSTICK
Ketones, UA: NEGATIVE
Protein, UA: NEGATIVE
Spec Grav, UA: 1.01
pH, UA: 6

## 2011-06-20 MED ORDER — CYANOCOBALAMIN 1000 MCG/ML IJ SOLN
1000.0000 ug | Freq: Once | INTRAMUSCULAR | Status: AC
  Administered 2011-06-20: 1000 ug via INTRAMUSCULAR

## 2011-06-21 LAB — CBC WITH DIFFERENTIAL/PLATELET
Basophils Absolute: 0 10*3/uL (ref 0.0–0.1)
Basophils Relative: 1 % (ref 0–1)
Eosinophils Relative: 2 % (ref 0–5)
HCT: 37.1 % (ref 36.0–46.0)
MCHC: 32.1 g/dL (ref 30.0–36.0)
MCV: 90.5 fL (ref 78.0–100.0)
Monocytes Absolute: 0.4 10*3/uL (ref 0.1–1.0)
RDW: 13.5 % (ref 11.5–15.5)

## 2011-06-21 LAB — COMPREHENSIVE METABOLIC PANEL
AST: 21 U/L (ref 0–37)
Alkaline Phosphatase: 99 U/L (ref 39–117)
BUN: 16 mg/dL (ref 6–23)
Calcium: 9.5 mg/dL (ref 8.4–10.5)
Chloride: 103 mEq/L (ref 96–112)
Creat: 1.08 mg/dL (ref 0.50–1.10)

## 2011-06-21 LAB — VITAMIN B12: Vitamin B-12: 444 pg/mL (ref 211–911)

## 2011-06-21 LAB — TSH: TSH: 0.607 u[IU]/mL (ref 0.350–4.500)

## 2011-06-21 LAB — T4, FREE: Free T4: 1.16 ng/dL (ref 0.80–1.80)

## 2011-06-28 ENCOUNTER — Other Ambulatory Visit: Payer: Medicare Other

## 2011-06-29 ENCOUNTER — Ambulatory Visit
Admission: RE | Admit: 2011-06-29 | Discharge: 2011-06-29 | Disposition: A | Discharge status: Home / Self Care | Payer: Medicare Other | Source: Ambulatory Visit | Attending: Internal Medicine | Admitting: Internal Medicine

## 2011-06-29 DIAGNOSIS — R51 Headache: Secondary | ICD-10-CM | POA: Diagnosis not present

## 2011-06-29 DIAGNOSIS — G35 Multiple sclerosis: Secondary | ICD-10-CM

## 2011-06-29 DIAGNOSIS — R209 Unspecified disturbances of skin sensation: Secondary | ICD-10-CM | POA: Diagnosis not present

## 2011-06-29 DIAGNOSIS — H5710 Ocular pain, unspecified eye: Secondary | ICD-10-CM

## 2011-06-29 MED ORDER — GADOBENATE DIMEGLUMINE 529 MG/ML IV SOLN
9.0000 mL | Freq: Once | INTRAVENOUS | Status: AC | PRN
  Administered 2011-06-29: 9 mL via INTRAVENOUS

## 2011-07-03 NOTE — Progress Notes (Signed)
  Subjective:    Patient ID: Erika Murphy, female    DOB: 06-Oct-1950, 61 y.o.   MRN: [id number]  HPI 61 year old black female with history of multiple sclerosis. She has a history of carcinoma of the stomach status post subtotal gastrectomy and partial omentectomy March 2001. Left shoulder replacement September 2011. History of iron and B12 deficiency secondary to gastrectomy. History of osteopenia. History of neurogenic bladder secondary to multiple sclerosis. Had Helicobacter pylori infection 06-22-2004. No known drug allergies. Colonoscopy 2001-06-22. His only rectal for leg pain with good results. Followed by Dr. Sandria Manly for multiple sclerosis. Was treated initially with Betaseron but currently is not taking that. Last MRI of the brain was March 2010. She's been having some sharp pains in the top of her head intermittently and that has been worrisome to her. Says it comes and goes. Not exactly a headache of any sort to some sharp pain. She's had some right periorbital pain as well. Complaining of fatigue. She gets monthly B12 injections through this office.  Mother died at age 68 from heart failure. Father died at age 83. He had blindness and was a diabetic. 2 sisters with diabetes. Brother died 2012-06-21of stomach cancer.  Dr. Sandria Manly felt patient has had chronic musculoskeletal pain consistent with fibromyalgia in addition to the multiple sclerosis. 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 Was diagnosed with multiple sclerosis in Jun 22, 1996.    Review of Systems     Objective:   Physical Exam HEENT exam: TMs and pharynx are clear. Funduscopic exam is benign. Discs are sharp and flat bilaterally. Cranial nerves II through XII are grossly intact. Moves all 4 extremities. Chest clear to auscultation. Cardiac exam regular rate and rhythm normal S1 and S2. Abdomen: no hepatosplenomegaly masses or tenderness. Alert and oriented x3. Affect is appropriate.        Assessment & Plan:    Multiple sclerosis-currently off Betaseron. She says it makes her feel bad and she has not been taking it in some time.  Right paravertebral pain and sharp pains in head intermittently-etiology unclear but suspect has something to do with multiple sclerosis  History of adenocarcinoma of the stomach  Anxiety  B12 deficiency secondary to gastrectomy  Iron deficiency secondary to gastrectomy  Osteopenia  Neurogenic bladder secondary to multiple sclerosis  Plan: Patient will have MRI of the brain with and without contrast in the near future. We went ahead today and drew CBC with differential, complete metabolic panel, TSH, free T4 and B12 level. Return in one month for B12 injection.

## 2011-07-03 NOTE — Patient Instructions (Addendum)
We will arrange for MRI of the brain with and without contrast. We will also arrange for you to see Dr. Sandria Manly since it's been about a year since her last appointment. Return in one month for B12 injection.

## 2011-07-04 MED ORDER — CYANOCOBALAMIN 1000 MCG/ML IJ SOLN
1000.0000 ug | Freq: Once | INTRAMUSCULAR | Status: AC
  Administered 2011-07-04: 1000 ug via INTRAMUSCULAR

## 2011-07-18 ENCOUNTER — Ambulatory Visit: Payer: Medicare Other | Admitting: Internal Medicine

## 2011-08-15 ENCOUNTER — Ambulatory Visit: Payer: Medicare Other | Admitting: Internal Medicine

## 2011-08-17 ENCOUNTER — Telehealth: Payer: Self-pay | Admitting: Internal Medicine

## 2011-08-17 ENCOUNTER — Ambulatory Visit (INDEPENDENT_AMBULATORY_CARE_PROVIDER_SITE_OTHER): Payer: Medicare Other | Admitting: Internal Medicine

## 2011-08-17 DIAGNOSIS — E538 Deficiency of other specified B group vitamins: Secondary | ICD-10-CM

## 2011-08-17 DIAGNOSIS — H5710 Ocular pain, unspecified eye: Secondary | ICD-10-CM | POA: Diagnosis not present

## 2011-08-17 DIAGNOSIS — G35 Multiple sclerosis: Secondary | ICD-10-CM | POA: Diagnosis not present

## 2011-08-17 MED ORDER — CYANOCOBALAMIN 1000 MCG/ML IJ SOLN
1000.0000 ug | Freq: Once | INTRAMUSCULAR | Status: AC
  Administered 2011-08-17: 1000 ug via INTRAMUSCULAR

## 2011-08-17 NOTE — Telephone Encounter (Signed)
Can transfer [facility] to [facility] [place].  Please advise and thanks!

## 2011-08-17 NOTE — Telephone Encounter (Signed)
Patient moving to [facility] to take care of a new grandchild. Says this will be a temporary mood. Prescription given for cyanocobalamin injection to use 1 cc IM monthly. She has a friend that is a nurse who can give her these injections while she is away. Also she [facility] [place]. I believe if she or he uses [facility] here they can do that automatically.

## 2011-08-18 DIAGNOSIS — H5310 Unspecified subjective visual disturbances: Secondary | ICD-10-CM | POA: Diagnosis not present

## 2011-09-12 ENCOUNTER — Ambulatory Visit (INDEPENDENT_AMBULATORY_CARE_PROVIDER_SITE_OTHER): Payer: Medicare Other | Admitting: Internal Medicine

## 2011-09-12 DIAGNOSIS — E538 Deficiency of other specified B group vitamins: Secondary | ICD-10-CM | POA: Diagnosis not present

## 2011-09-12 MED ORDER — CYANOCOBALAMIN 1000 MCG/ML IJ SOLN
1000.0000 ug | Freq: Once | INTRAMUSCULAR | Status: AC
  Administered 2011-09-12: 1000 ug via INTRAMUSCULAR

## 2011-09-19 ENCOUNTER — Ambulatory Visit: Payer: Medicare Other | Admitting: Internal Medicine

## 2011-10-09 ENCOUNTER — Other Ambulatory Visit (HOSPITAL_BASED_OUTPATIENT_CLINIC_OR_DEPARTMENT_OTHER): Payer: Medicare Other | Admitting: Lab

## 2011-10-09 ENCOUNTER — Ambulatory Visit (HOSPITAL_BASED_OUTPATIENT_CLINIC_OR_DEPARTMENT_OTHER): Payer: Medicare Other | Admitting: Hematology & Oncology

## 2011-10-09 VITALS — BP 139/66 | HR 62 | Temp 98.1°F | Resp 16 | Ht 64.0 in | Wt 107.0 lb

## 2011-10-09 DIAGNOSIS — D509 Iron deficiency anemia, unspecified: Secondary | ICD-10-CM

## 2011-10-09 DIAGNOSIS — Z85028 Personal history of other malignant neoplasm of stomach: Secondary | ICD-10-CM | POA: Diagnosis not present

## 2011-10-09 DIAGNOSIS — E538 Deficiency of other specified B group vitamins: Secondary | ICD-10-CM

## 2011-10-09 DIAGNOSIS — G35 Multiple sclerosis: Secondary | ICD-10-CM | POA: Diagnosis not present

## 2011-10-09 DIAGNOSIS — D002 Carcinoma in situ of stomach: Secondary | ICD-10-CM

## 2011-10-09 DIAGNOSIS — D649 Anemia, unspecified: Secondary | ICD-10-CM

## 2011-10-09 LAB — COMPREHENSIVE METABOLIC PANEL
ALT: 12 U/L (ref 0–35)
AST: 21 U/L (ref 0–37)
CO2: 28 mEq/L (ref 19–32)
Chloride: 105 mEq/L (ref 96–112)
Sodium: 139 mEq/L (ref 135–145)
Total Bilirubin: 0.8 mg/dL (ref 0.3–1.2)
Total Protein: 6.8 g/dL (ref 6.0–8.3)

## 2011-10-09 LAB — CBC WITH DIFFERENTIAL (CANCER CENTER ONLY)
BASO#: 0 10*3/uL (ref 0.0–0.2)
EOS%: 3.6 % (ref 0.0–7.0)
Eosinophils Absolute: 0.1 10*3/uL (ref 0.0–0.5)
HCT: 36.1 % (ref 34.8–46.6)
HGB: 11.8 g/dL (ref 11.6–15.9)
LYMPH%: 38.6 % (ref 14.0–48.0)
MCH: 29.6 pg (ref 26.0–34.0)
MCHC: 32.7 g/dL (ref 32.0–36.0)
MCV: 91 fL (ref 81–101)
MONO%: 12.6 % (ref 0.0–13.0)
NEUT#: 1.2 10*3/uL — ABNORMAL LOW (ref 1.5–6.5)
NEUT%: 44.8 % (ref 39.6–80.0)

## 2011-10-09 LAB — IRON AND TIBC
%SAT: 35 % (ref 20–55)
TIBC: 380 ug/dL (ref 250–470)

## 2011-10-09 LAB — LACTATE DEHYDROGENASE: LDH: 138 U/L (ref 94–250)

## 2011-10-09 NOTE — Progress Notes (Signed)
This office note has been dictated.

## 2011-10-10 ENCOUNTER — Telehealth: Payer: Self-pay | Admitting: *Deleted

## 2011-10-10 NOTE — Telephone Encounter (Addendum)
10/24/11: Left message on pt's voicemail with the below message. She can call back to have appt scheduled if she doesn't have any questions but if she has questions then she can call back to speak with one of the nurses.   Message copied by Wynonia Hazard on Tue Oct 10, 2011  9:13 AM ------      Message from: Josph Macho      Created: Mon Oct 09, 2011  6:26 PM       Charina Fons:            Let her know that the Iron is borderline.  I think that she will benefit from [facility] at 510mg  x 1 dose.  Please set this up.            Thanks!!            pete

## 2011-10-10 NOTE — Progress Notes (Signed)
CC:   Luanna Cole. Lenord Fellers, M.D. Genene Churn. Love, M.D.  DIAGNOSES: 1. Stage II (T3 N0 M0) adenocarcinoma of the stomach, remission. 2. Multiple sclerosis/fibromyalgia. 3. Recurrent iron-deficiency anemia.  CURRENT THERAPY:  IV iron as indicated.  INTERIM HISTORY:  Ms. Mahood comes in for followup.  She is doing pretty well.  She has really had no complaints since we last saw her.  We last saw her back in March.  Unfortunately, her, I think, cousin is not doing too well and is over at [facility].  Ms. Maruna just got back from [place].  She had a good time up in [place].  She is eating well.  Her fibromyalgia has not bothered her all that much.  She is on Lyrica for this.  When we last saw her in March, her ferritin was 55 with an iron saturation of 28%.  Again, multiple sclerosis has not been too much of a problem for her.  PHYSICAL EXAMINATION:  This is a thin but well-nourished black female in no obvious distress.  Vital signs:  Temperature of 98.1, pulse 62, respiratory rate 16, blood pressure 139/66.  Weight is 107.  Head and neck:  Normocephalic, atraumatic skull.  There are no ocular or oral lesions.  There is no scleral icterus.  There is no adenopathy in the neck.  Lungs:  Clear bilaterally.  Cardiac:  Regular rate and rhythm with a normal S1 and S2.  There are no murmurs, rubs or bruits. Abdomen:  Soft with good bowel sounds.  There is no palpable abdominal mass.  There is no fluid wave.  She has well-healed laparotomy scar. There is no palpable hepatosplenomegaly.  Extremities:  No clubbing, cyanosis or edema.  She has decent range of motion of her joints.  Skin: No rashes, ecchymoses or petechia.  Neurologic:  No focal neurological deficit.  LABORATORY STUDIES:  White count 2.8, hemoglobin 11.8, hematocrit 36.1, platelet count 151.  Ferritin is 20 with an iron saturation of 35%.  LDH is 138.  BUN 17, creatinine 1.1.  IMPRESSION:  Ms. Marsico is a 61 year old African  American female with a remote history of stage II adenocarcinoma of the stomach.  She underwent resection followed by adjuvant therapy.  This was at least 11 years ago. Again, she is cured of this.  I noticed that her hemoglobin is going down slowly.  Her iron studies are equivocal, so we may have to get her set up with some IV iron.  I think this would make her feel better.  We will try to get this set up for her in the next week or so.  I will plan see her back in another 6 months.    ______________________________ Josph Macho, M.D. PRE/MEDQ  D:  10/09/2011  T:  10/10/2011  Job:  [id number]

## 2011-10-17 ENCOUNTER — Ambulatory Visit (INDEPENDENT_AMBULATORY_CARE_PROVIDER_SITE_OTHER): Payer: Medicare Other | Admitting: Internal Medicine

## 2011-10-17 ENCOUNTER — Other Ambulatory Visit: Payer: Self-pay | Admitting: Internal Medicine

## 2011-10-17 DIAGNOSIS — E538 Deficiency of other specified B group vitamins: Secondary | ICD-10-CM | POA: Diagnosis not present

## 2011-10-18 MED ORDER — CYANOCOBALAMIN 1000 MCG/ML IJ SOLN
1000.0000 ug | Freq: Once | INTRAMUSCULAR | Status: AC
  Administered 2011-10-17: 1000 ug via INTRAMUSCULAR

## 2011-10-19 ENCOUNTER — Other Ambulatory Visit: Payer: Self-pay | Admitting: Internal Medicine

## 2011-10-19 ENCOUNTER — Other Ambulatory Visit: Payer: Self-pay

## 2011-10-24 ENCOUNTER — Telehealth: Payer: Self-pay | Admitting: Hematology & Oncology

## 2011-10-24 ENCOUNTER — Other Ambulatory Visit: Payer: Self-pay | Admitting: *Deleted

## 2011-10-24 DIAGNOSIS — D509 Iron deficiency anemia, unspecified: Secondary | ICD-10-CM

## 2011-10-24 NOTE — Telephone Encounter (Signed)
Patient called per RN message to sch appt.  Patient sch for 10/27/11

## 2011-10-27 ENCOUNTER — Ambulatory Visit (HOSPITAL_BASED_OUTPATIENT_CLINIC_OR_DEPARTMENT_OTHER): Payer: Medicare Other

## 2011-10-27 VITALS — BP 130/66 | HR 73 | Temp 97.7°F | Resp 20

## 2011-10-27 DIAGNOSIS — D509 Iron deficiency anemia, unspecified: Secondary | ICD-10-CM | POA: Diagnosis not present

## 2011-10-27 MED ORDER — SODIUM CHLORIDE 0.9 % IV SOLN
Freq: Once | INTRAVENOUS | Status: AC
  Administered 2011-10-27: 10:00:00 via INTRAVENOUS

## 2011-10-27 MED ORDER — FERUMOXYTOL INJECTION 510 MG/17 ML
510.0000 mg | Freq: Once | INTRAVENOUS | Status: AC
  Administered 2011-10-27: 510 mg via INTRAVENOUS
  Filled 2011-10-27: qty 17

## 2011-10-27 NOTE — Patient Instructions (Signed)
Ferumoxytol injection What is this medicine? FERUMOXYTOL is an iron complex. Iron is used to make healthy red blood cells, which carry oxygen and nutrients throughout the body. This medicine is used to treat iron deficiency anemia in people with chronic kidney disease. This medicine may be used for other purposes; ask your health care provider or pharmacist if you have questions. What should I tell my health care provider before I take this medicine? They need to know if you have any of these conditions: -anemia not caused by low iron levels -high levels of iron in the blood -magnetic resonance imaging (MRI) test scheduled -an unusual or allergic reaction to iron, other medicines, foods, dyes, or preservatives -pregnant or trying to get pregnant -breast-feeding How should I use this medicine? This medicine is for infusion into a vein. It is given by a health care professional in a hospital or clinic setting. Talk to your pediatrician regarding the use of this medicine in children. Special care may be needed. Overdosage: If you think you've taken too much of this medicine contact a poison control center or emergency room at once. Overdosage: If you think you have taken too much of this medicine contact a poison control center or emergency room at once. NOTE: This medicine is only for you. Do not share this medicine with others. What if I miss a dose? It is important not to miss your dose. Call your doctor or health care professional if you are unable to keep an appointment. What may interact with this medicine? This medicine may interact with the following medications: -other iron products This list may not describe all possible interactions. Give your health care provider a list of all the medicines, herbs, non-prescription drugs, or dietary supplements you use. Also tell them if you smoke, drink alcohol, or use illegal drugs. Some items may interact with your medicine. What should I watch  for while using this medicine? Visit your doctor or healthcare professional regularly. Tell your doctor or healthcare professional if your symptoms do not start to get better or if they get worse. You may need blood work done while you are taking this medicine. You may need to follow a special diet. Talk to your doctor. Foods that contain iron include: whole grains/cereals, dried fruits, beans, or peas, leafy green vegetables, and organ meats (liver, kidney). What side effects may I notice from receiving this medicine? Side effects that you should report to your doctor or health care professional as soon as possible: -allergic reactions like skin rash, itching or hives, swelling of the face, lips, or tongue -breathing problems -changes in blood pressure -feeling faint or lightheaded, falls -fever or chills -flushing, sweating, or hot feelings -swelling of the ankles or feet Side effects that usually do not require medical attention (Report these to your doctor or health care professional if they continue or are bothersome.): -diarrhea -headache -nausea, vomiting -stomach pain This list may not describe all possible side effects. Call your doctor for medical advice about side effects. You may report side effects to FDA at 1-800-FDA-1088. Where should I keep my medicine? This drug is given in a hospital or clinic and will not be stored at home. NOTE: This sheet is a summary. It may not cover all possible information. If you have questions about this medicine, talk to your doctor, pharmacist, or health care provider.  2012, Elsevier/Gold Standard. (09/11/2007 9:48:25 PM) 

## 2011-11-07 ENCOUNTER — Ambulatory Visit: Payer: Medicare Other | Admitting: Internal Medicine

## 2011-11-09 ENCOUNTER — Telehealth: Payer: Self-pay | Admitting: *Deleted

## 2011-11-09 NOTE — Telephone Encounter (Signed)
error 

## 2011-11-09 NOTE — Telephone Encounter (Signed)
Message copied by Wynonia Hazard on Thu Nov 09, 2011 12:07 PM ------      Message from: Josph Macho      Created: Mon Oct 09, 2011  6:26 PM       Arrionna Serena:            Let her know that the Iron is borderline.  I think that she will benefit from [facility] at 510mg  x 1 dose.  Please set this up.            Thanks!!            pete

## 2011-11-17 ENCOUNTER — Ambulatory Visit (INDEPENDENT_AMBULATORY_CARE_PROVIDER_SITE_OTHER): Payer: Medicare Other | Admitting: Internal Medicine

## 2011-11-17 DIAGNOSIS — E538 Deficiency of other specified B group vitamins: Secondary | ICD-10-CM | POA: Diagnosis not present

## 2011-11-17 MED ORDER — CYANOCOBALAMIN 1000 MCG/ML IJ SOLN
1000.0000 ug | Freq: Once | INTRAMUSCULAR | Status: AC
  Administered 2011-11-17: 1000 ug via INTRAMUSCULAR

## 2011-12-19 ENCOUNTER — Ambulatory Visit (INDEPENDENT_AMBULATORY_CARE_PROVIDER_SITE_OTHER): Payer: Medicare Other | Admitting: Internal Medicine

## 2011-12-19 DIAGNOSIS — E538 Deficiency of other specified B group vitamins: Secondary | ICD-10-CM | POA: Diagnosis not present

## 2011-12-19 MED ORDER — CYANOCOBALAMIN 1000 MCG/ML IJ SOLN
1000.0000 ug | Freq: Once | INTRAMUSCULAR | Status: AC
  Administered 2011-12-19: 1000 ug via INTRAMUSCULAR

## 2011-12-22 ENCOUNTER — Telehealth: Payer: Self-pay | Admitting: Hematology & Oncology

## 2011-12-22 NOTE — Telephone Encounter (Signed)
Pt called wants to ask MD question about Daughter, transferred to RN voice mail.

## 2012-01-11 ENCOUNTER — Encounter: Payer: Self-pay | Admitting: Internal Medicine

## 2012-01-11 ENCOUNTER — Ambulatory Visit (INDEPENDENT_AMBULATORY_CARE_PROVIDER_SITE_OTHER): Payer: Medicare Other | Admitting: Internal Medicine

## 2012-01-11 VITALS — BP 134/72 | HR 68 | Temp 97.5°F | Wt 103.5 lb

## 2012-01-11 DIAGNOSIS — Z85028 Personal history of other malignant neoplasm of stomach: Secondary | ICD-10-CM

## 2012-01-11 DIAGNOSIS — E538 Deficiency of other specified B group vitamins: Secondary | ICD-10-CM | POA: Diagnosis not present

## 2012-01-11 DIAGNOSIS — Z86003 Personal history of in-situ neoplasm of oral cavity, esophagus and stomach: Secondary | ICD-10-CM

## 2012-01-11 DIAGNOSIS — D649 Anemia, unspecified: Secondary | ICD-10-CM | POA: Diagnosis not present

## 2012-01-11 DIAGNOSIS — G35 Multiple sclerosis: Secondary | ICD-10-CM

## 2012-01-11 LAB — IRON AND TIBC
%SAT: 36 % (ref 20–55)
Iron: 118 ug/dL (ref 42–145)
UIBC: 206 ug/dL (ref 125–400)

## 2012-01-11 LAB — CBC WITH DIFFERENTIAL/PLATELET
Eosinophils Absolute: 0.1 10*3/uL (ref 0.0–0.7)
Hemoglobin: 11.9 g/dL — ABNORMAL LOW (ref 12.0–15.0)
Lymphocytes Relative: 39 % (ref 12–46)
Lymphs Abs: 1.2 10*3/uL (ref 0.7–4.0)
MCH: 29.8 pg (ref 26.0–34.0)
MCV: 88.2 fL (ref 78.0–100.0)
Monocytes Relative: 13 % — ABNORMAL HIGH (ref 3–12)
Neutrophils Relative %: 45 % (ref 43–77)
RBC: 3.99 MIL/uL (ref 3.87–5.11)

## 2012-01-11 MED ORDER — CYANOCOBALAMIN 1000 MCG/ML IJ SOLN
1000.0000 ug | Freq: Once | INTRAMUSCULAR | Status: AC
  Administered 2012-01-11: 1000 ug via INTRAMUSCULAR

## 2012-01-11 NOTE — Progress Notes (Signed)
  Subjective:    Patient ID: LAKEYA MULKA, female    DOB: 09/25/1950, 62 y.o.   MRN: [id number]  HPI History of iron deficiency received IV iron in October per Dr. Myna Hidalgo.  Had low ferritin. Here for followup of medicial issues, MS and B12 deficiency. Had C-met in October. Had flu vaccine here already. Sees Dr. Sandria Manly in February and Dr. Myna Hidalgo in March. History of gastric carcinoma status post surgery leaving her B12 deficient. Long-standing history of multiple sclerosis. Took Betaseron for a while but discontinued it. Feels she is doing fairly well. Ambulates with a cane. Has some issues with anxiety. Has recurrent urinary tract infections due to neurogenic bladder. Has had issues with appetite and holding her weight. Takes Lyrica for pain associated with multiple sclerosis.    Review of Systems     Objective:   Physical Exam skin warm and dry. Nodes none. HEENT exam: TMs and pharynx are clear. Neck is supple without thyromegaly or adenopathy. Chest clear to auscultation. Cardiac exam regular rate and rhythm normal S1 and S2. Extremities thin without edema. Alert and oriented. Affect is cheerful        Assessment & Plan:  B 12 deficiency post gastrectomy for carcinoma Fe deficiency-treated with IV iron in October per Dr. Myna Hidalgo Multiple sclerosis- Lyrica helps pain.  Currently not on Beta seron History of gastric cancer-stable with no recurrence followed by Dr. Myna Hidalgo Plan: Check iron and iron-binding capacity, B12 level and CBC with differential. Return in 6 months for physical exam. Continue monthly B12 injections here.

## 2012-02-13 ENCOUNTER — Ambulatory Visit (INDEPENDENT_AMBULATORY_CARE_PROVIDER_SITE_OTHER): Payer: Medicare Other | Admitting: Internal Medicine

## 2012-02-13 DIAGNOSIS — E538 Deficiency of other specified B group vitamins: Secondary | ICD-10-CM

## 2012-02-13 MED ORDER — CYANOCOBALAMIN 1000 MCG/ML IJ SOLN
1000.0000 ug | Freq: Once | INTRAMUSCULAR | Status: AC
  Administered 2012-02-13: 1000 ug via INTRAMUSCULAR

## 2012-02-19 DIAGNOSIS — G35 Multiple sclerosis: Secondary | ICD-10-CM | POA: Diagnosis not present

## 2012-03-10 ENCOUNTER — Encounter: Payer: Self-pay | Admitting: Internal Medicine

## 2012-03-10 NOTE — Patient Instructions (Addendum)
Continue same medications and return for physical exam in 6 months. Continue monthly B12 injections through this office

## 2012-03-21 ENCOUNTER — Ambulatory Visit (INDEPENDENT_AMBULATORY_CARE_PROVIDER_SITE_OTHER): Payer: Medicare Other | Admitting: Internal Medicine

## 2012-03-21 ENCOUNTER — Encounter: Payer: Self-pay | Admitting: Internal Medicine

## 2012-03-21 VITALS — BP 132/68 | HR 84 | Temp 99.6°F | Wt 103.0 lb

## 2012-03-21 DIAGNOSIS — H6690 Otitis media, unspecified, unspecified ear: Secondary | ICD-10-CM

## 2012-03-21 DIAGNOSIS — J069 Acute upper respiratory infection, unspecified: Secondary | ICD-10-CM

## 2012-03-21 DIAGNOSIS — H6693 Otitis media, unspecified, bilateral: Secondary | ICD-10-CM

## 2012-03-21 NOTE — Progress Notes (Signed)
  Subjective:    Patient ID: Erika Murphy, female    DOB: Jan 17, 1950, 62 y.o.   MRN: [id number]  HPI 62 year old Black female with multiple sclerosis, history of carcinoma of the stomach status post subtotal gastrectomy with Vitamin B12 deficiency getting monthly IM injections in today with URI symptoms onset March 14. No fever or shaking chills. Has had cough and congestion. Says that grandchild has been ill. Says she saw neurologist recently, Dr. Sandria Manly, who is retiring. She'll be assigned to another neurologist.    Review of Systems     Objective:   Physical Exam HEENT exam: Pharynx is injected without exudate. She sounds hoarse. TMs are red bilaterally and full. Neck is supple. Chest clear.        Assessment & Plan:  Acute bilateral otitis media  Acute upper respiratory infection  Plan: Amoxicillin 500 mg 3 times daily for 10 days. Tessalon Perles 100 mg (#60) 2 by mouth 3 times a day when necessary cough.

## 2012-03-21 NOTE — Patient Instructions (Addendum)
Take a low-dose for 10 days. Take Tessalon Perles as needed for cough.

## 2012-04-08 ENCOUNTER — Other Ambulatory Visit (HOSPITAL_BASED_OUTPATIENT_CLINIC_OR_DEPARTMENT_OTHER): Payer: Medicare Other | Admitting: Lab

## 2012-04-08 ENCOUNTER — Ambulatory Visit (HOSPITAL_BASED_OUTPATIENT_CLINIC_OR_DEPARTMENT_OTHER): Payer: Medicare Other | Admitting: Hematology & Oncology

## 2012-04-08 VITALS — BP 115/68 | HR 62 | Temp 98.0°F | Resp 16 | Ht 64.0 in | Wt 103.0 lb

## 2012-04-08 DIAGNOSIS — D509 Iron deficiency anemia, unspecified: Secondary | ICD-10-CM | POA: Diagnosis not present

## 2012-04-08 DIAGNOSIS — G35 Multiple sclerosis: Secondary | ICD-10-CM | POA: Diagnosis not present

## 2012-04-08 DIAGNOSIS — Z85028 Personal history of other malignant neoplasm of stomach: Secondary | ICD-10-CM

## 2012-04-08 DIAGNOSIS — D002 Carcinoma in situ of stomach: Secondary | ICD-10-CM

## 2012-04-08 DIAGNOSIS — D508 Other iron deficiency anemias: Secondary | ICD-10-CM

## 2012-04-08 LAB — COMPREHENSIVE METABOLIC PANEL
Albumin: 4.1 g/dL (ref 3.5–5.2)
CO2: 32 mEq/L (ref 19–32)
Chloride: 105 mEq/L (ref 96–112)
Glucose, Bld: 64 mg/dL — ABNORMAL LOW (ref 70–99)
Potassium: 3.9 mEq/L (ref 3.5–5.3)
Sodium: 141 mEq/L (ref 135–145)
Total Protein: 7.1 g/dL (ref 6.0–8.3)

## 2012-04-08 LAB — CBC WITH DIFFERENTIAL (CANCER CENTER ONLY)
BASO#: 0 10*3/uL (ref 0.0–0.2)
EOS%: 2.8 % (ref 0.0–7.0)
LYMPH%: 39.8 % (ref 14.0–48.0)
MCH: 29.7 pg (ref 26.0–34.0)
MCHC: 32.4 g/dL (ref 32.0–36.0)
MCV: 92 fL (ref 81–101)
MONO%: 12.6 % (ref 0.0–13.0)
NEUT#: 1.1 10*3/uL — ABNORMAL LOW (ref 1.5–6.5)
NEUT%: 44 % (ref 39.6–80.0)
RBC: 4.11 10*6/uL (ref 3.70–5.32)
WBC: 2.5 10*3/uL — ABNORMAL LOW (ref 3.9–10.0)

## 2012-04-08 LAB — RETICULOCYTES (CHCC): Retic Ct Pct: 0.9 % (ref 0.4–2.3)

## 2012-04-08 LAB — FERRITIN: Ferritin: 248 ng/mL (ref 10–291)

## 2012-04-08 NOTE — Progress Notes (Signed)
This office note has been dictated.

## 2012-04-09 ENCOUNTER — Telehealth: Payer: Self-pay | Admitting: Oncology

## 2012-04-09 ENCOUNTER — Ambulatory Visit (INDEPENDENT_AMBULATORY_CARE_PROVIDER_SITE_OTHER): Payer: Medicare Other | Admitting: Internal Medicine

## 2012-04-09 DIAGNOSIS — E538 Deficiency of other specified B group vitamins: Secondary | ICD-10-CM

## 2012-04-09 MED ORDER — CYANOCOBALAMIN 1000 MCG/ML IJ SOLN
1000.0000 ug | Freq: Once | INTRAMUSCULAR | Status: AC
  Administered 2012-04-09: 1000 ug via INTRAMUSCULAR

## 2012-04-09 NOTE — Progress Notes (Signed)
CC:   Erika Murphy. Erika Murphy, M.D. [facility], Fax [phone]  DIAGNOSES: 1. Stage II (T3 N0 M0) adenocarcinoma of the stomach, remission. 2. Multiple sclerosis/fibromyalgia. 3. Recurrent iron deficiency anemia.  CURRENT THERAPY:  IV iron as needed.  INTERIM HISTORY:  Erika Murphy comes in for followup.  Erika Murphy has done quite well.  Erika Murphy has really had no complaints since we last saw her back in October.  Erika Murphy got through the winter time.  Erika Murphy has not had any flare-ups of the multiple sclerosis.  Erika Murphy has had no fevers, sweats or chills.  There has been no cough or shortness of breath.  Her appetite has been good.  When we last saw her and had lab work done back in January, her ferritin was 38.  Her B12 level was 542.  PHYSICAL EXAMINATION:  General:  This is a thin African American female in no obvious distress.  Vital signs:  Show temperature of 98, pulse 62, respiratory rate 16, blood pressure 115/68.  Weight is 103.  Head and neck:  Shows a normocephalic, atraumatic skull.  There are no ocular or oral lesions.  There are no palpable cervical or supraclavicular lymph nodes.  Lungs:  Clear bilaterally.  Cardiac:  Regular rate and rhythm with a normal S1 and S2.  There are no murmurs, rubs or bruits. Abdomen:  Soft with good bowel sounds.  There is no palpable abdominal mass.  There is no fluid wave.  There is no palpable hepatosplenomegaly. Extremities:  Show no clubbing, cyanosis or edema.  LABORATORY STUDIES:  White cell count is 2.5, hemoglobin 12.2, hematocrit 37.7, platelet count is 190.  IMPRESSION:  Erika Murphy is a very charming 62 year old African American female with a past history of stage II adenocarcinoma of the stomach. Erika Murphy underwent resection followed by adjuvant chemo and radiation therapy.  Erika Murphy now is about 12 years out.  Erika Murphy is really not anemic.  We do check her iron studies.  I would have to suspect that they should be okay.  We will go ahead and  plan to get her back in another 6 months.    ______________________________ Josph Macho, M.D. PRE/MEDQ  D:  04/08/2012  T:  04/09/2012  Job:  [id number]

## 2012-04-09 NOTE — Telephone Encounter (Addendum)
Message copied by Lacie Draft on Tue Apr 09, 2012  9:00 AM ------      Message from: Arlan Organ R      Created: Mon Apr 08, 2012  6:08 PM       Call - labs and iron are ok!!!  Pete ------Left message on answering machine. Teola Bradley, Delpha Perko [employer]

## 2012-05-01 ENCOUNTER — Ambulatory Visit: Payer: Medicare Other | Admitting: Neurology

## 2012-05-04 ENCOUNTER — Other Ambulatory Visit: Payer: Self-pay | Admitting: Internal Medicine

## 2012-05-06 ENCOUNTER — Other Ambulatory Visit: Payer: Self-pay

## 2012-05-06 ENCOUNTER — Other Ambulatory Visit: Payer: Self-pay | Admitting: Internal Medicine

## 2012-05-07 ENCOUNTER — Ambulatory Visit (INDEPENDENT_AMBULATORY_CARE_PROVIDER_SITE_OTHER): Payer: Medicare Other | Admitting: Internal Medicine

## 2012-05-07 ENCOUNTER — Encounter: Payer: Self-pay | Admitting: Internal Medicine

## 2012-05-07 ENCOUNTER — Telehealth: Payer: Self-pay | Admitting: *Deleted

## 2012-05-07 VITALS — BP 122/80 | HR 68 | Temp 98.2°F | Wt 101.0 lb

## 2012-05-07 DIAGNOSIS — G35 Multiple sclerosis: Secondary | ICD-10-CM | POA: Diagnosis not present

## 2012-05-07 DIAGNOSIS — IMO0001 Reserved for inherently not codable concepts without codable children: Secondary | ICD-10-CM

## 2012-05-07 DIAGNOSIS — E538 Deficiency of other specified B group vitamins: Secondary | ICD-10-CM

## 2012-05-07 DIAGNOSIS — L03019 Cellulitis of unspecified finger: Secondary | ICD-10-CM

## 2012-05-07 MED ORDER — CYANOCOBALAMIN 1000 MCG/ML IJ SOLN
1000.0000 ug | Freq: Once | INTRAMUSCULAR | Status: AC
  Administered 2012-05-07: 1000 ug via INTRAMUSCULAR

## 2012-05-07 NOTE — Telephone Encounter (Signed)
Patient call back to R/S her appointment on 09/11/2012.

## 2012-05-07 NOTE — Progress Notes (Signed)
  Subjective:    Patient ID: Erika Murphy, female    DOB: [birth year], 62 y.o.   MRN: [id number]  HPI  Was 103 lbs in March- says appetite is good and eating well. Despite this she lost 2 pounds since last visit in March. She's here today regarding paronychia right fourth finger. Doesn't recall any injury to the cuticle. Has been irritated and sore. No significant drainage. She has a history of multiple sclerosis. She also has history of gastric cancer. She has B12 deficiency and gets monthly B12 injections.    Review of Systems     Objective:   Physical Exam erythema about cuticle right fourth finger consistent with  paronychia. No drainage. Do not feel this needs to be incised and drained. It is minor at this point in time.        Assessment & Plan:  Paronychia right fourth finger  2 pound weight loss  Plan: Soak finger in warm soapy water for 20 minutes daily. Antibiotics prescribed.

## 2012-05-07 NOTE — Telephone Encounter (Signed)
Left message for patient

## 2012-05-13 ENCOUNTER — Telehealth: Payer: Self-pay | Admitting: *Deleted

## 2012-05-13 NOTE — Telephone Encounter (Signed)
Patient appointment 09/12/2012 per patient.

## 2012-06-02 NOTE — Patient Instructions (Addendum)
Take antibiotics as directed. Soak finger in warm soapy water for 20 minutes daily until healed

## 2012-06-07 ENCOUNTER — Ambulatory Visit (INDEPENDENT_AMBULATORY_CARE_PROVIDER_SITE_OTHER): Payer: Medicare Other | Admitting: Internal Medicine

## 2012-06-07 VITALS — Wt 103.0 lb

## 2012-06-07 DIAGNOSIS — E538 Deficiency of other specified B group vitamins: Secondary | ICD-10-CM | POA: Diagnosis not present

## 2012-06-07 MED ORDER — CYANOCOBALAMIN 1000 MCG/ML IJ SOLN
1000.0000 ug | Freq: Once | INTRAMUSCULAR | Status: AC
  Administered 2012-06-07: 1000 ug via INTRAMUSCULAR

## 2012-06-07 MED ORDER — LEVOFLOXACIN 250 MG PO TABS: 250.0000 mg | ORAL_TABLET | Freq: Every day | ORAL | Status: DC

## 2012-06-07 NOTE — Patient Instructions (Signed)
Patient calls back this pm, forgot to mention her finger that seems to be infected. Says it gets a little pus out of it sometimes. Per Dr. Lenord Fellers, to start Levaquin 250mg  daily for 7 days. If no better after that, will refer her elsewhere for evaluation.

## 2012-06-07 NOTE — Addendum Note (Signed)
Addended by: Judy Pimple on: 06/07/2012 02:48 PM   Modules accepted: Orders

## 2012-07-01 ENCOUNTER — Other Ambulatory Visit: Payer: Self-pay | Admitting: Internal Medicine

## 2012-07-01 DIAGNOSIS — Z1231 Encounter for screening mammogram for malignant neoplasm of breast: Secondary | ICD-10-CM

## 2012-07-08 ENCOUNTER — Ambulatory Visit (INDEPENDENT_AMBULATORY_CARE_PROVIDER_SITE_OTHER): Payer: Medicare Other | Admitting: Internal Medicine

## 2012-07-08 VITALS — Wt 101.5 lb

## 2012-07-08 DIAGNOSIS — E538 Deficiency of other specified B group vitamins: Secondary | ICD-10-CM

## 2012-07-08 MED ORDER — CYANOCOBALAMIN 1000 MCG/ML IJ SOLN
1000.0000 ug | Freq: Once | INTRAMUSCULAR | Status: AC
  Administered 2012-07-08: 1000 ug via INTRAMUSCULAR

## 2012-07-12 ENCOUNTER — Ambulatory Visit (HOSPITAL_COMMUNITY)
Admission: RE | Admit: 2012-07-12 | Discharge: 2012-07-12 | Disposition: A | Discharge status: Home / Self Care | Payer: Medicare Other | Source: Ambulatory Visit | Attending: Internal Medicine | Admitting: Internal Medicine

## 2012-07-12 DIAGNOSIS — Z1231 Encounter for screening mammogram for malignant neoplasm of breast: Secondary | ICD-10-CM | POA: Diagnosis not present

## 2012-07-15 ENCOUNTER — Other Ambulatory Visit: Payer: Self-pay

## 2012-07-15 MED ORDER — LANSOPRAZOLE 30 MG PO CPDR: 30.0000 mg | DELAYED_RELEASE_CAPSULE | Freq: Every day | ORAL | Status: DC

## 2012-08-08 ENCOUNTER — Encounter: Payer: Self-pay | Admitting: Internal Medicine

## 2012-08-08 ENCOUNTER — Ambulatory Visit (INDEPENDENT_AMBULATORY_CARE_PROVIDER_SITE_OTHER): Payer: Medicare Other | Admitting: Internal Medicine

## 2012-08-08 VITALS — Wt 103.5 lb

## 2012-08-08 DIAGNOSIS — E538 Deficiency of other specified B group vitamins: Secondary | ICD-10-CM

## 2012-08-08 MED ORDER — CYANOCOBALAMIN 1000 MCG/ML IJ SOLN
1000.0000 ug | Freq: Once | INTRAMUSCULAR | Status: AC
  Administered 2012-08-08: 1000 ug via INTRAMUSCULAR

## 2012-08-13 ENCOUNTER — Ambulatory Visit: Payer: Self-pay | Admitting: Neurology

## 2012-08-26 ENCOUNTER — Ambulatory Visit: Payer: Medicare Other | Admitting: Internal Medicine

## 2012-08-29 ENCOUNTER — Ambulatory Visit: Payer: Medicare Other | Admitting: Neurology

## 2012-09-06 ENCOUNTER — Ambulatory Visit: Payer: Medicare Other | Admitting: Internal Medicine

## 2012-09-09 ENCOUNTER — Other Ambulatory Visit: Payer: Self-pay | Admitting: Internal Medicine

## 2012-09-10 ENCOUNTER — Ambulatory Visit (INDEPENDENT_AMBULATORY_CARE_PROVIDER_SITE_OTHER): Payer: Medicare Other | Admitting: Internal Medicine

## 2012-09-10 ENCOUNTER — Encounter: Payer: Self-pay | Admitting: Internal Medicine

## 2012-09-10 VITALS — BP 138/68 | Temp 97.8°F | Wt 101.5 lb

## 2012-09-10 DIAGNOSIS — G5600 Carpal tunnel syndrome, unspecified upper limb: Secondary | ICD-10-CM

## 2012-09-10 DIAGNOSIS — D72819 Decreased white blood cell count, unspecified: Secondary | ICD-10-CM | POA: Diagnosis not present

## 2012-09-10 DIAGNOSIS — G5602 Carpal tunnel syndrome, left upper limb: Secondary | ICD-10-CM

## 2012-09-10 DIAGNOSIS — Z8639 Personal history of other endocrine, nutritional and metabolic disease: Secondary | ICD-10-CM

## 2012-09-10 DIAGNOSIS — R5381 Other malaise: Secondary | ICD-10-CM

## 2012-09-10 DIAGNOSIS — G35 Multiple sclerosis: Secondary | ICD-10-CM | POA: Diagnosis not present

## 2012-09-10 DIAGNOSIS — M549 Dorsalgia, unspecified: Secondary | ICD-10-CM

## 2012-09-10 DIAGNOSIS — Z130 Encounter for screening for diseases of the blood and blood-forming organs and certain disorders involving the immune mechanism: Secondary | ICD-10-CM | POA: Diagnosis not present

## 2012-09-10 DIAGNOSIS — E538 Deficiency of other specified B group vitamins: Secondary | ICD-10-CM | POA: Diagnosis not present

## 2012-09-10 DIAGNOSIS — Z85028 Personal history of other malignant neoplasm of stomach: Secondary | ICD-10-CM

## 2012-09-10 DIAGNOSIS — Z862 Personal history of diseases of the blood and blood-forming organs and certain disorders involving the immune mechanism: Secondary | ICD-10-CM

## 2012-09-10 DIAGNOSIS — D709 Neutropenia, unspecified: Secondary | ICD-10-CM | POA: Diagnosis not present

## 2012-09-10 LAB — CBC WITH DIFFERENTIAL/PLATELET
HCT: 35.6 % — ABNORMAL LOW (ref 36.0–46.0)
Hemoglobin: 11.6 g/dL — ABNORMAL LOW (ref 12.0–15.0)
Lymphs Abs: 1.1 10*3/uL (ref 0.7–4.0)
Monocytes Absolute: 0.3 10*3/uL (ref 0.1–1.0)
Monocytes Relative: 11 % (ref 3–12)
Neutro Abs: 1.2 10*3/uL — ABNORMAL LOW (ref 1.7–7.7)
Neutrophils Relative %: 45 % (ref 43–77)
RBC: 4.03 MIL/uL (ref 3.87–5.11)

## 2012-09-10 LAB — POCT URINALYSIS DIPSTICK
Bilirubin, UA: NEGATIVE
Blood, UA: NEGATIVE
Glucose, UA: NEGATIVE
Ketones, UA: NEGATIVE
Leukocytes, UA: NEGATIVE
Nitrite, UA: NEGATIVE
pH, UA: 5.5

## 2012-09-10 MED ORDER — CYANOCOBALAMIN 1000 MCG/ML IJ SOLN
1000.0000 ug | Freq: Once | INTRAMUSCULAR | Status: AC
  Administered 2012-09-10: 1000 ug via INTRAMUSCULAR

## 2012-09-10 NOTE — Patient Instructions (Addendum)
Keep appointment with neurologist September 11. Lab studies have been drawn. You have given influenza immunization B12 injection today.

## 2012-09-10 NOTE — Progress Notes (Signed)
  Subjective:    Patient ID: Erika Murphy, female    DOB: 1950-12-09, 62 y.o.   MRN: [id number]  HPI 62 year old Black female with history of multiple sclerosis. Currently not on any medication for MS. Used to take Betaseron but did not like the way it made her feel. Takes Lyrica for pain with good relief. Recently has been having some numbness in first 4 fingers of left hand. Having some burning sensation in lower back. She's worried about urinary tract infection but urinalysis today is normal. History of urogenital bladder. History of adenocarcinoma of the stomach treated with subtotal gastrectomy and omentectomy 2001. Subsequent B12 and iron deficiency as result of the subtotal gastrectomy. Gets monthly B12 injections through this office. Has had some issues with weight loss. Weight in May was 101 pounds. Sexually gain a half pounds since May. History of osteopenia.    Review of Systems     Objective:   Physical Exam Phalen's sign is positive . Neck is supple without thyromegaly. Chest clear. Cardiac exam regular rate and rhythm. Muscle strength in the lower extremities is fairly good 4/ 5 in both lower remedies. Muscle strength in the upper extremities is 3-4/5.        Assessment & Plan:  Multiple sclerosis  History of carcinoma of the stomach  Paresthesias left hand-? Carpal tunnel syndrome versus cervical lesions of MS  History of leukopenia-followed by Dr. Myna Hidalgo  Fatigue-B12, folate, iron/ iron binding capacity checked in addition to CBC with differential and C. met, free T4 and TSH. We'll have pathology review of smear.  Plan: Patient to see neurologist on September 11. We have drawn a number of lab studies today including CBC with differential, Patholgy review of smear, TSH, free T4, B12, folate, iron iron-binding capacity and C. met. She has a history of leukopenia followed by Dr. Myna Hidalgo and she gets regular monthly B12 injections status post gastrectomy. Influenza immunization  given today. B12 injection given today.  25 minutes spent with patient

## 2012-09-11 ENCOUNTER — Ambulatory Visit: Payer: Self-pay | Admitting: Neurology

## 2012-09-11 LAB — COMPREHENSIVE METABOLIC PANEL
Albumin: 3.7 g/dL (ref 3.5–5.2)
BUN: 11 mg/dL (ref 6–23)
CO2: 29 mEq/L (ref 19–32)
Calcium: 9.1 mg/dL (ref 8.4–10.5)
Chloride: 107 mEq/L (ref 96–112)
Glucose, Bld: 114 mg/dL — ABNORMAL HIGH (ref 70–99)
Potassium: 3.9 mEq/L (ref 3.5–5.3)
Sodium: 141 mEq/L (ref 135–145)
Total Protein: 6.4 g/dL (ref 6.0–8.3)

## 2012-09-11 LAB — T4, FREE: Free T4: 1.03 ng/dL (ref 0.80–1.80)

## 2012-09-11 LAB — TSH: TSH: 0.56 u[IU]/mL (ref 0.350–4.500)

## 2012-09-11 LAB — IRON AND TIBC: UIBC: 195 ug/dL (ref 125–400)

## 2012-09-11 LAB — PATHOLOGIST SMEAR REVIEW

## 2012-09-12 ENCOUNTER — Encounter: Payer: Self-pay | Admitting: Neurology

## 2012-09-12 ENCOUNTER — Ambulatory Visit (INDEPENDENT_AMBULATORY_CARE_PROVIDER_SITE_OTHER): Payer: Medicare Other | Admitting: Neurology

## 2012-09-12 VITALS — BP 121/75 | HR 75 | Ht 64.0 in | Wt 100.0 lb

## 2012-09-12 DIAGNOSIS — F419 Anxiety disorder, unspecified: Secondary | ICD-10-CM

## 2012-09-12 DIAGNOSIS — F411 Generalized anxiety disorder: Secondary | ICD-10-CM | POA: Diagnosis not present

## 2012-09-12 DIAGNOSIS — G35 Multiple sclerosis: Secondary | ICD-10-CM

## 2012-09-12 DIAGNOSIS — N319 Neuromuscular dysfunction of bladder, unspecified: Secondary | ICD-10-CM

## 2012-09-12 DIAGNOSIS — R269 Unspecified abnormalities of gait and mobility: Secondary | ICD-10-CM | POA: Diagnosis not present

## 2012-09-12 MED ORDER — PREGABALIN 50 MG PO CAPS: 100.0000 mg | ORAL_CAPSULE | Freq: Three times a day (TID) | ORAL | Status: DC

## 2012-09-12 NOTE — Progress Notes (Signed)
[facility]  PATIENT: Erika Murphy DOB: 1950-10-10  HISTORICAL  Erika Murphy is a 62 years old right-handed African American female, patient of Dr. Sandria Manly, followed up for multiple sclerosis, last clinical visit was February 19 2012  Her primary care physician is Dr. Eden Emms Baxley  She was diagnosed with multiple sclerosis in 1998, presented with spastic gait disorder, was treated with Betaseron since 1998, which was stopped in 2011 due to abnormal liver functional test, her liver function the past did recover back to normal after stopping Betaseron.  Her multiple sclerosis symptoms have been  fairly stable over years, there was no worsening of her symptoms without taking Betaseron and baclofen, she is only taking Lyrica 50 mg 3 times a day for neuropathic pain involving bilateral upper and lower extremities, over the past 2 months, since July 2014, she also had intermittent left shoulder radiating pain to her left lateral arm, left hand, sometimes woke her up from sleep, intermittent, there was no significant worsening of weakness  She also had a history of left shoulder replacement in 2011,  She has a history of gastric adenocarcinoma, status post subtotal gastrectomy, partial omentectomy in March [address], status post chemotherapy and radiation therapy.  Most recent MRI of the brain was in June 2013, small white matter hyperintensities are unchanged from 2010. No enhancing or restricted diffusion lesions are seen. These lesions are nonspecific and could be seen with multiple sclerosis or chronic microvascular ischemia most abnormality is seen in MRI of cervical spine, most recently in 2011,  MRI scan of the cervical spine in 2011 showed prominent spondylitic changes from C4-6 with mild left sided foramnial stenosis at C4-5, C5-6 and bilateral foraminal stenosis at C6-7. Illdefined spinal cord hyperintensities at C5-6 and T2-3 likely represent remote age demyelinating  plaque  She lives alone, ambulate with a cane, no bowel bladder incontinence,  REVIEW OF SYSTEMS: Full 14 system review of systems performed and notable only for numbness, fatigue, cramps, achy muscles, decreased energy   ALLERGIES: No Known Allergies  HOME MEDICATIONS: Outpatient Prescriptions Prior to Visit  Medication Sig Dispense Refill  . Calcium Carbonate-Vit D-Min (CALTRATE PLUS PO) Take by mouth 2 (two) times daily at 10 AM and 5 PM.        . Cholecalciferol (VITAMIN D) 1000 UNITS capsule Take 1,000 Units by mouth daily.        . lansoprazole (PREVACID) 30 MG capsule Take 1 capsule (30 mg total) by mouth daily.  90 capsule  3  . LYRICA 50 MG capsule TAKE ONE CAPSULE BY MOUTH 3 TIMES A DAY  90 capsule  2   No facility-administered medications prior to visit.    PAST MEDICAL HISTORY: Past Medical History  Diagnosis Date  . Cancer   . MS (multiple sclerosis)   . Osteopenia   . Vitamin B12 deficiency   . Thyroid disease     rt thyroid nodule  . IBS (irritable bowel syndrome)   . Neurogenic bladder     PAST SURGICAL HISTORY: Past Surgical History  Procedure Laterality Date  . Partial gastrectomy      partial omentectomy    FAMILY HISTORY: No family history on file.  SOCIAL HISTORY:  History   Social History  . Marital Status: Divorced    Spouse Name: N/A    Number of Children: 3   . Years of Education: N/A   Occupational History  . DISABLED    Social History Main Topics  . Smoking  status: Never Smoker   . Smokeless tobacco: Never Used  . Alcohol Use: No  . Drug Use: Not on file  . Sexual Activity: Not on file   Other Topics Concern  . Not on file   Social History Narrative   Patient is retired.    Patient is married with 3 children.   Patient drinks caffeine daily.      PHYSICAL EXAM    Filed Vitals:   09/12/12 1539  BP: 121/75  Pulse: 75  Height: 5\' 4"  (1.626 m)  Weight: 100 lb (45.36 kg)     Body mass index is 17.16  kg/(m^2).   Generalized: In no acute distress  Neck: Supple, no carotid bruits   Cardiac: Regular rate rhythm  Pulmonary: Clear to auscultation bilaterally  Musculoskeletal: No deformity  Neurological examination  Mentation: Alert oriented to time, place, history taking, and causual conversation,  Very thin  Cranial nerve II-XII: Pupils were equal round reactive to light extraocular movements were full, visual field were full on confrontational test. facial sensation and strength were normal. hearing was intact to finger rubbing bilaterally. Uvula tongue midline.  head turning and shoulder shrug and were normal and symmetric.Tongue protrusion into cheek strength was normal.  Motor: bilateral upper extremity motor strength was normal, bilateral lower extremity (right/left), hip flexion 4/4+, knee flexion 4/5, knee extension 4+/5, ankle dorsiflexion 4/5 ankle plantar flexion 4+/5,    Sensory: Intact to fine touch, pinprick, preserved vibratory sensation, and proprioception at toes.  Coordination: Normal finger to nose, heel-to-shin bilaterally there was no truncal ataxia  Gait: Needed assistance to get up from seated position, spastic tortious dragging her right leg    Romberg signs: Negative  Deep tendon reflexes: Brachioradialis 2/2, biceps 2/2, triceps 2/2, patellar 3/3, Achilles 2/2, plantar responses were flexor bilaterally.   DIAGNOSTIC DATA (LABS, IMAGING, TESTING) - I reviewed patient records, labs, notes, testing and imaging myself where available.  Lab Results  Component Value Date   WBC 2.7* 09/10/2012   HGB 11.6* 09/10/2012   HCT 35.6* 09/10/2012   MCV 88.3 09/10/2012   PLT 173 09/10/2012      Component Value Date/Time   NA 141 09/10/2012 1309   K 3.9 09/10/2012 1309   CL 107 09/10/2012 1309   CO2 29 09/10/2012 1309   GLUCOSE 114* 09/10/2012 1309   BUN 11 09/10/2012 1309   CREATININE 1.07 09/10/2012 1309   CREATININE 1.07 04/08/2012 1205   CALCIUM 9.1 09/10/2012 1309   PROT 6.4  09/10/2012 1309   ALBUMIN 3.7 09/10/2012 1309   AST 19 09/10/2012 1309   ALT 11 09/10/2012 1309   ALKPHOS 95 09/10/2012 1309   BILITOT 0.7 09/10/2012 1309   GFRNONAA >60 09/25/2009 0450   GFRAA  Value: >60        The eGFR has been calculated using the MDRD equation. This calculation has not been validated in all clinical situations. eGFR's persistently <60 mL/min signify possible Chronic Kidney Disease. 09/25/2009 0450   Lab Results  Component Value Date   VITAMINB12 648 09/10/2012   Lab Results  Component Value Date   TSH 0.560 09/10/2012     ASSESSMENT AND PLAN   62 years old right-handed African American female, with past medical history of relapsing remitting multiple sclerosis, was treated with Betaseron from 1998 to 2011, developed abnormal liver function in the past, which has recovered after stopping Betaseron, the abnormalities are mainly at the cervical spine, detailed above, minimal findings on MRI of the brain, symptoms  has been stable for many years  2., will repeat MRI of the brain and cervical spine  2, continue moderate exercise  3, increased Lyrica to 50 mg 2 tablets 3 times a day  4., I will call her MRI report  5, return to clinic in one year, call clinic for new issues.   Levert Feinstein, M.D. Ph.D.  [facility] [address], [address] [place], [place] [address] [phone]

## 2012-09-12 NOTE — Progress Notes (Signed)
Patient contacted and message left on her voicemail with Dr. Beryle Quant message.  B12 level is normal.  Thyroid looks ok.  Also putting a copy of the labwork in the mail to the patient.

## 2012-09-26 ENCOUNTER — Ambulatory Visit
Admission: RE | Admit: 2012-09-26 | Discharge: 2012-09-26 | Disposition: A | Discharge status: Home / Self Care | Payer: Medicare Other | Source: Ambulatory Visit | Attending: Neurology | Admitting: Neurology

## 2012-09-26 ENCOUNTER — Telehealth: Payer: Self-pay | Admitting: Neurology

## 2012-09-26 DIAGNOSIS — R269 Unspecified abnormalities of gait and mobility: Secondary | ICD-10-CM

## 2012-09-26 DIAGNOSIS — F419 Anxiety disorder, unspecified: Secondary | ICD-10-CM

## 2012-09-26 DIAGNOSIS — G35 Multiple sclerosis: Secondary | ICD-10-CM

## 2012-09-26 DIAGNOSIS — N319 Neuromuscular dysfunction of bladder, unspecified: Secondary | ICD-10-CM

## 2012-09-26 MED ORDER — GADOBENATE DIMEGLUMINE 529 MG/ML IV SOLN
9.0000 mL | Freq: Once | INTRAVENOUS | Status: AC | PRN
  Administered 2012-09-26: 9 mL via INTRAVENOUS

## 2012-09-26 NOTE — Telephone Encounter (Signed)
I have called her,   MRI cervical spine (with and without) demonstrating: 1. Chronic demyelinating plaques at C5-6 and T2-3. 2. No acute plaques. 3. Disc bulging C4-5 to C6-7 with multi-level foraminal stenosis as above. 4. No significant change from MRI on 03/19/09.  MRI brain (with and without) demonstrating: 1. Few periventricular and subcortical foci of T2 hyperintensities, compatible with diagnosis of chronic multiple sclerosis. No acute plaques.  2. No change from MRI on 06/29/11.

## 2012-10-07 ENCOUNTER — Other Ambulatory Visit (HOSPITAL_BASED_OUTPATIENT_CLINIC_OR_DEPARTMENT_OTHER): Payer: Medicare Other | Admitting: Lab

## 2012-10-07 ENCOUNTER — Telehealth: Payer: Self-pay | Admitting: Hematology & Oncology

## 2012-10-07 ENCOUNTER — Ambulatory Visit (HOSPITAL_BASED_OUTPATIENT_CLINIC_OR_DEPARTMENT_OTHER): Payer: Medicare Other | Admitting: Hematology & Oncology

## 2012-10-07 VITALS — BP 110/47 | HR 71 | Temp 98.2°F | Resp 14 | Ht 64.0 in | Wt 102.0 lb

## 2012-10-07 DIAGNOSIS — D509 Iron deficiency anemia, unspecified: Secondary | ICD-10-CM

## 2012-10-07 DIAGNOSIS — Z85028 Personal history of other malignant neoplasm of stomach: Secondary | ICD-10-CM

## 2012-10-07 DIAGNOSIS — D002 Carcinoma in situ of stomach: Secondary | ICD-10-CM | POA: Diagnosis not present

## 2012-10-07 DIAGNOSIS — G35 Multiple sclerosis: Secondary | ICD-10-CM

## 2012-10-07 DIAGNOSIS — D508 Other iron deficiency anemias: Secondary | ICD-10-CM

## 2012-10-07 LAB — CBC WITH DIFFERENTIAL (CANCER CENTER ONLY)
BASO%: 0.3 % (ref 0.0–2.0)
EOS%: 3.8 % (ref 0.0–7.0)
HGB: 12.7 g/dL (ref 11.6–15.9)
LYMPH#: 1.1 10*3/uL (ref 0.9–3.3)
LYMPH%: 38 % (ref 14.0–48.0)
MCH: 30.2 pg (ref 26.0–34.0)
MCHC: 32.4 g/dL (ref 32.0–36.0)
MONO%: 9.8 % (ref 0.0–13.0)
NEUT#: 1.4 10*3/uL — ABNORMAL LOW (ref 1.5–6.5)
Platelets: 169 10*3/uL (ref 145–400)
RDW: 12.1 % (ref 11.1–15.7)

## 2012-10-07 NOTE — Telephone Encounter (Signed)
Mailed April schedule °

## 2012-10-07 NOTE — Progress Notes (Signed)
This office note has been dictated.

## 2012-10-08 LAB — IRON AND TIBC CHCC
%SAT: 34 % (ref 21–57)
TIBC: 338 ug/dL (ref 236–444)
UIBC: 222 ug/dL (ref 120–384)

## 2012-10-08 LAB — COMPREHENSIVE METABOLIC PANEL
ALT: 16 U/L (ref 0–35)
AST: 22 U/L (ref 0–37)
Albumin: 4.2 g/dL (ref 3.5–5.2)
Calcium: 9.4 mg/dL (ref 8.4–10.5)
Chloride: 106 mEq/L (ref 96–112)
Potassium: 4.2 mEq/L (ref 3.5–5.3)
Sodium: 143 mEq/L (ref 135–145)
Total Bilirubin: 0.6 mg/dL (ref 0.3–1.2)

## 2012-10-08 NOTE — Progress Notes (Signed)
CC:   Erika Murphy. Erika Murphy, M.D. [facility] Fax [phone]  DIAGNOSES: 1. Stage II (T3 N0 M0) adenocarcinoma of the stomach, clinical     remission. 2. Recurrent iron deficiency-anemia. 3. Multiple sclerosis.  CURRENT THERAPY:  IV iron as indicated.  INTERIM HISTORY:  Erika Murphy comes in for followup.  We see her every 6 months.  Erika Murphy has been doing pretty well.  We last saw her back in April.  Her last iron that Erika Murphy got was back in October of 2013.  When we last saw her in April, her iron studies showed a saturation of 34%.  Her total iron was 100.  Her B12 level back in April was 648.  Erika Murphy has had no problems with nausea and vomiting.  Her appetite has been okay.  Erika Murphy is not able to gain much weight.  Erika Murphy and her family are heading up to [place] on Wednesday.  There will be a retirement party up there that they are going to go to.  Erika Murphy has had no problems with cough or shortness of breath.  Erika Murphy is having some issues with either multiple sclerosis or spinal stenosis in her cervical spine.  Erika Murphy has been having some numbness in the hands.  There is no weakness.  There may be a little bit of ataxia. Erika Murphy had an MRI of the brain and cervical spine.  There are some multiple sclerosis changes.  Nothing was noted that was different from 2 or 3 years ago.  Erika Murphy has not noted any problems with change in bowel or bladder habits. There has been no incontinence.  PHYSICAL EXAMINATION:  General:  This is a thin African female in no obvious distress.  Vital signs:  Temperature of 98.3, pulse 71, respiratory rate 14, blood pressure 110/47.  Weight is 102 pounds.  Head and neck:  Normocephalic, atraumatic skull.  There are no ocular or oral lesions.  There are no palpable cervical or supraclavicular lymph nodes. Lungs:  Clear bilaterally.  Cardiac:  Regular rate and rhythm with a normal S1 and S2.  Erika Murphy has a 1/6 systolic ejection murmur.  Abdomen: Soft.  Erika Murphy has a well-healed  laparotomy scar.  There is no fluid wave. There is no palpable abdominal mass.  No palpable hepatosplenomegaly is noted.  Extremities:  Show some muscle atrophy in upper and lower extremities.  Erika Murphy has 4/5 strength in her upper and lower extremities. Erika Murphy has good range motion of her joints.  Skin:  No rashes, ecchymoses or petechiae.  Neurological:  No obvious neurological deficits.  LABORATORY STUDIES:  White cell count is 2.9, hemoglobin 12.7, hematocrit 39.2, platelet count 169,000.  MCV is 93.  IMPRESSION:  Erika Murphy is a 62 year old African American female.  Erika Murphy has history of resected stage II adenocarcinoma of the stomach.  Erika Murphy is cured from this in my point of view.  It has now been 13 years since Erika Murphy underwent resection followed by adjuvant chemo and radiation therapy.  I would think that her iron studies should be okay.  Her hemoglobin is doing fairly well.  Erika Murphy has chronically low white cells.  This is stable for her.  I suspect that this might be ethnic-associated leukopenia which does not increase the risk of infection or other hematologic problems.  We will go ahead and plan to get her back in 6 more months.  We will get her through the holidays and through the wintertime before we have to get her back in the springtime.  ______________________________ Erika Murphy, M.D. PRE/MEDQ  D:  10/07/2012  T:  10/08/2012  Job:  [id number]

## 2012-10-17 ENCOUNTER — Ambulatory Visit (INDEPENDENT_AMBULATORY_CARE_PROVIDER_SITE_OTHER): Payer: Medicare Other | Admitting: Internal Medicine

## 2012-10-17 VITALS — Wt 102.5 lb

## 2012-10-17 DIAGNOSIS — Z23 Encounter for immunization: Secondary | ICD-10-CM

## 2012-10-17 DIAGNOSIS — E538 Deficiency of other specified B group vitamins: Secondary | ICD-10-CM

## 2012-10-17 MED ORDER — CYANOCOBALAMIN 1000 MCG/ML IJ SOLN
1000.0000 ug | Freq: Once | INTRAMUSCULAR | Status: AC
  Administered 2012-10-17: 1000 ug via INTRAMUSCULAR

## 2012-10-24 ENCOUNTER — Telehealth: Payer: Self-pay | Admitting: Internal Medicine

## 2012-10-24 DIAGNOSIS — G35 Multiple sclerosis: Secondary | ICD-10-CM

## 2012-10-25 NOTE — Telephone Encounter (Signed)
Dr. Lenord Fellers gave verbal ok to call in Lyrica daily #30 to [facility] [phone]).  Spoke with Junior and filled Rx.

## 2012-11-15 ENCOUNTER — Ambulatory Visit (INDEPENDENT_AMBULATORY_CARE_PROVIDER_SITE_OTHER): Payer: Medicare Other | Admitting: Internal Medicine

## 2012-11-15 DIAGNOSIS — E538 Deficiency of other specified B group vitamins: Secondary | ICD-10-CM | POA: Diagnosis not present

## 2012-11-15 MED ORDER — CYANOCOBALAMIN 1000 MCG/ML IJ SOLN
1000.0000 ug | Freq: Once | INTRAMUSCULAR | Status: AC
  Administered 2012-11-15: 1000 ug via INTRAMUSCULAR

## 2012-12-16 ENCOUNTER — Ambulatory Visit (INDEPENDENT_AMBULATORY_CARE_PROVIDER_SITE_OTHER): Payer: Medicare Other | Admitting: Internal Medicine

## 2012-12-16 ENCOUNTER — Encounter: Payer: Self-pay | Admitting: Internal Medicine

## 2012-12-16 VITALS — BP 136/80 | HR 72 | Temp 97.8°F | Ht 64.5 in | Wt 105.0 lb

## 2012-12-16 DIAGNOSIS — E538 Deficiency of other specified B group vitamins: Secondary | ICD-10-CM

## 2012-12-16 DIAGNOSIS — R55 Syncope and collapse: Secondary | ICD-10-CM | POA: Diagnosis not present

## 2012-12-16 LAB — CBC WITH DIFFERENTIAL/PLATELET
Basophils Relative: 1 % (ref 0–1)
Eosinophils Absolute: 0.1 10*3/uL (ref 0.0–0.7)
Hemoglobin: 12.2 g/dL (ref 12.0–15.0)
Lymphs Abs: 1.5 10*3/uL (ref 0.7–4.0)
MCHC: 32.4 g/dL (ref 30.0–36.0)
Monocytes Relative: 10 % (ref 3–12)
Neutro Abs: 1.2 10*3/uL — ABNORMAL LOW (ref 1.7–7.7)
Neutrophils Relative %: 38 % — ABNORMAL LOW (ref 43–77)
Platelets: 179 10*3/uL (ref 150–400)
RBC: 4.22 MIL/uL (ref 3.87–5.11)

## 2012-12-16 LAB — COMPREHENSIVE METABOLIC PANEL
ALT: 18 U/L (ref 0–35)
AST: 26 U/L (ref 0–37)
Albumin: 4 g/dL (ref 3.5–5.2)
Alkaline Phosphatase: 113 U/L (ref 39–117)
Chloride: 106 mEq/L (ref 96–112)
Glucose, Bld: 101 mg/dL — ABNORMAL HIGH (ref 70–99)
Potassium: 4.2 mEq/L (ref 3.5–5.3)
Sodium: 140 mEq/L (ref 135–145)
Total Bilirubin: 0.7 mg/dL (ref 0.3–1.2)
Total Protein: 6.9 g/dL (ref 6.0–8.3)

## 2012-12-16 LAB — POCT URINALYSIS DIPSTICK
Bilirubin, UA: NEGATIVE
Blood, UA: NEGATIVE
Ketones, UA: NEGATIVE
Nitrite, UA: NEGATIVE
Protein, UA: NEGATIVE
Spec Grav, UA: 1.015
pH, UA: 7

## 2012-12-16 LAB — TSH: TSH: 0.964 u[IU]/mL (ref 0.350–4.500)

## 2012-12-16 MED ORDER — CYANOCOBALAMIN 1000 MCG/ML IJ SOLN
1000.0000 ug | Freq: Once | INTRAMUSCULAR | Status: AC
  Administered 2012-12-16: 1000 ug via INTRAMUSCULAR

## 2012-12-16 NOTE — Patient Instructions (Addendum)
Appt made for orthopedist for right shoulder pain. Return in one month for B12 injection. Lab work is pending.

## 2012-12-16 NOTE — Progress Notes (Signed)
   Subjective:    Patient ID: Erika Murphy, female    DOB: [birth year], 62 y.o.   MRN: [id number]  HPI 62 year old Black female with history of vitamin B12  deficiency secondary to gastric resection for carcinoma as well as multiple sclerosis in today for B12 injection. Recently had an episode where she became anxious, broke out into a sweat and felt like her legs would not move. Could not reach the phone to call for help. Did not have vertigo. Symptoms resolved within a short period of time. Says she used to have one episode every 6 months and recently has had 2 episodes a month. This is frightened her. Suggested she might want to get a Lifeline device to wear around her neck to call for help if needed. She says she is worried about low blood sugar. Lab work done in October showed no evidence of hypoglycemia. Her iron level was normal at that time. No urinary tract infection symptoms. We did urinalysis today which was normal. She recently saw neurologist Dr. Terrace Arabia who wants to see her again in a year. She's been having issues with her right shoulder. She is status post left shoulder surgery. Does not want to have surgery on right shoulder but wants a cortisone injection. Appointment made to see orthopedist in [facility] who treated her previously    Review of Systems     Objective:   Physical Exam Skin is warm and dry. Chest clear to auscultation. Cardiac exam regular rate and rhythm. No orthostatic changes on blood pressure readings. Urinalysis is normal. Lab work is pending.        Assessment & Plan:  Patient's symptoms sound a bit like vasovagal near-syncope. Could be related to multiple sclerosis particularly with the feeling that her legs would not move. We are going to check CBC, C. met. B12 injection given today. Return in 4 weeks for B12 injection. Suggested she obtain a Lifeline device to wear around her neck in case she needs immediate assistance.  Diagnosis: Multiple  sclerosis  Vitamin B12  deficiency  History of gastric carcinoma  Vasovagal near-syncope  Right shoulder arthropathy-refer to orthopedist

## 2012-12-17 ENCOUNTER — Telehealth: Payer: Self-pay | Admitting: Internal Medicine

## 2012-12-17 NOTE — Telephone Encounter (Signed)
Spoke with patient to notify her of appointment.  Patient confirmed.

## 2013-01-15 DIAGNOSIS — M19019 Primary osteoarthritis, unspecified shoulder: Secondary | ICD-10-CM | POA: Diagnosis not present

## 2013-01-15 DIAGNOSIS — M67919 Unspecified disorder of synovium and tendon, unspecified shoulder: Secondary | ICD-10-CM | POA: Diagnosis not present

## 2013-01-15 DIAGNOSIS — M719 Bursopathy, unspecified: Secondary | ICD-10-CM | POA: Diagnosis not present

## 2013-02-06 ENCOUNTER — Telehealth: Payer: Self-pay | Admitting: Internal Medicine

## 2013-02-06 ENCOUNTER — Other Ambulatory Visit: Payer: Self-pay

## 2013-02-06 MED ORDER — DIAZEPAM 5 MG PO TABS: 5.0000 mg | ORAL_TABLET | Freq: Once | ORAL | Status: DC

## 2013-02-06 NOTE — Telephone Encounter (Signed)
Wants refill on Valium for multiple sclerosis/muscle spasm. Call in Valium 5 mg #30 with one refill one by mouth daily as needed

## 2013-02-11 ENCOUNTER — Ambulatory Visit (INDEPENDENT_AMBULATORY_CARE_PROVIDER_SITE_OTHER): Payer: Medicare Other | Admitting: Internal Medicine

## 2013-02-11 DIAGNOSIS — E538 Deficiency of other specified B group vitamins: Secondary | ICD-10-CM | POA: Diagnosis not present

## 2013-02-11 MED ORDER — CYANOCOBALAMIN 1000 MCG/ML IJ SOLN
1000.0000 ug | Freq: Once | INTRAMUSCULAR | Status: AC
  Administered 2013-02-11: 1000 ug via INTRAMUSCULAR

## 2013-02-19 ENCOUNTER — Other Ambulatory Visit: Payer: Self-pay | Admitting: Internal Medicine

## 2013-02-19 ENCOUNTER — Other Ambulatory Visit: Payer: Self-pay

## 2013-03-13 ENCOUNTER — Ambulatory Visit (INDEPENDENT_AMBULATORY_CARE_PROVIDER_SITE_OTHER): Payer: Medicare Other | Admitting: Internal Medicine

## 2013-03-13 DIAGNOSIS — E538 Deficiency of other specified B group vitamins: Secondary | ICD-10-CM | POA: Diagnosis not present

## 2013-03-13 MED ORDER — CYANOCOBALAMIN 1000 MCG/ML IJ SOLN
1000.0000 ug | Freq: Once | INTRAMUSCULAR | Status: AC
  Administered 2013-03-13: 1000 ug via INTRAMUSCULAR

## 2013-03-19 ENCOUNTER — Other Ambulatory Visit: Payer: Self-pay | Admitting: Internal Medicine

## 2013-03-19 ENCOUNTER — Other Ambulatory Visit: Payer: Self-pay

## 2013-04-07 ENCOUNTER — Ambulatory Visit (HOSPITAL_BASED_OUTPATIENT_CLINIC_OR_DEPARTMENT_OTHER): Payer: Medicare Other | Admitting: Hematology & Oncology

## 2013-04-07 ENCOUNTER — Encounter: Payer: Self-pay | Admitting: Hematology & Oncology

## 2013-04-07 ENCOUNTER — Ambulatory Visit (HOSPITAL_BASED_OUTPATIENT_CLINIC_OR_DEPARTMENT_OTHER)
Admission: RE | Admit: 2013-04-07 | Discharge: 2013-04-07 | Disposition: A | Discharge status: Home / Self Care | Payer: Medicare Other | Source: Ambulatory Visit | Attending: Hematology & Oncology | Admitting: Hematology & Oncology

## 2013-04-07 ENCOUNTER — Other Ambulatory Visit (HOSPITAL_BASED_OUTPATIENT_CLINIC_OR_DEPARTMENT_OTHER): Payer: Medicare Other | Admitting: Lab

## 2013-04-07 VITALS — BP 141/60 | HR 64 | Temp 98.3°F | Resp 14 | Ht 63.0 in | Wt 98.0 lb

## 2013-04-07 DIAGNOSIS — Z85028 Personal history of other malignant neoplasm of stomach: Secondary | ICD-10-CM

## 2013-04-07 DIAGNOSIS — R0602 Shortness of breath: Secondary | ICD-10-CM

## 2013-04-07 DIAGNOSIS — G35 Multiple sclerosis: Secondary | ICD-10-CM

## 2013-04-07 DIAGNOSIS — D509 Iron deficiency anemia, unspecified: Secondary | ICD-10-CM | POA: Diagnosis not present

## 2013-04-07 LAB — CBC WITH DIFFERENTIAL (CANCER CENTER ONLY)
BASO#: 0 10*3/uL (ref 0.0–0.2)
BASO%: 0.3 % (ref 0.0–2.0)
EOS%: 1.7 % (ref 0.0–7.0)
Eosinophils Absolute: 0.1 10*3/uL (ref 0.0–0.5)
HCT: 39.2 % (ref 34.8–46.6)
HGB: 12.7 g/dL (ref 11.6–15.9)
LYMPH#: 1.1 10*3/uL (ref 0.9–3.3)
LYMPH%: 31.3 % (ref 14.0–48.0)
MCH: 30 pg (ref 26.0–34.0)
MCHC: 32.4 g/dL (ref 32.0–36.0)
MCV: 93 fL (ref 81–101)
MONO#: 0.4 10*3/uL (ref 0.1–0.9)
MONO%: 10.5 % (ref 0.0–13.0)
NEUT%: 56.2 % (ref 39.6–80.0)
NEUTROS ABS: 2 10*3/uL (ref 1.5–6.5)
PLATELETS: 177 10*3/uL (ref 145–400)
RBC: 4.24 10*6/uL (ref 3.70–5.32)
RDW: 12.2 % (ref 11.1–15.7)
WBC: 3.5 10*3/uL — AB (ref 3.9–10.0)

## 2013-04-07 LAB — RETICULOCYTES (CHCC)
ABS RETIC: 46.1 10*3/uL (ref 19.0–186.0)
RBC.: 4.19 MIL/uL (ref 3.87–5.11)
RETIC CT PCT: 1.1 % (ref 0.4–2.3)

## 2013-04-07 LAB — CHCC SATELLITE - SMEAR

## 2013-04-08 ENCOUNTER — Telehealth: Payer: Self-pay | Admitting: *Deleted

## 2013-04-08 LAB — FERRITIN CHCC: FERRITIN: 114 ng/mL (ref 9–269)

## 2013-04-08 LAB — IRON AND TIBC CHCC
%SAT: 31 % (ref 21–57)
Iron: 106 ug/dL (ref 41–142)
TIBC: 343 ug/dL (ref 236–444)
UIBC: 236 ug/dL (ref 120–384)

## 2013-04-08 NOTE — Telephone Encounter (Addendum)
Message copied by Lenn Sink on Tue Apr 08, 2013  1:26 PM ------      Message from: [name]      Created: Tue Apr 08, 2013  6:30 AM       Call - CXR is ok!!  Laurey Arrow ------Informed patient that chest xray is okay.

## 2013-04-11 ENCOUNTER — Ambulatory Visit
Admission: RE | Admit: 2013-04-11 | Discharge: 2013-04-11 | Disposition: A | Discharge status: Home / Self Care | Payer: Medicare Other | Source: Ambulatory Visit | Attending: Internal Medicine | Admitting: Internal Medicine

## 2013-04-11 ENCOUNTER — Encounter: Payer: Self-pay | Admitting: Internal Medicine

## 2013-04-11 ENCOUNTER — Ambulatory Visit (INDEPENDENT_AMBULATORY_CARE_PROVIDER_SITE_OTHER): Payer: Medicare Other | Admitting: Internal Medicine

## 2013-04-11 VITALS — BP 132/80 | HR 60 | Temp 97.6°F | Wt 98.5 lb

## 2013-04-11 DIAGNOSIS — Z0189 Encounter for other specified special examinations: Secondary | ICD-10-CM

## 2013-04-11 DIAGNOSIS — E538 Deficiency of other specified B group vitamins: Secondary | ICD-10-CM | POA: Diagnosis not present

## 2013-04-11 DIAGNOSIS — R0600 Dyspnea, unspecified: Secondary | ICD-10-CM

## 2013-04-11 DIAGNOSIS — R634 Abnormal weight loss: Secondary | ICD-10-CM | POA: Diagnosis not present

## 2013-04-11 DIAGNOSIS — R0609 Other forms of dyspnea: Secondary | ICD-10-CM | POA: Diagnosis not present

## 2013-04-11 DIAGNOSIS — R0989 Other specified symptoms and signs involving the circulatory and respiratory systems: Secondary | ICD-10-CM

## 2013-04-11 DIAGNOSIS — I77819 Aortic ectasia, unspecified site: Secondary | ICD-10-CM | POA: Diagnosis not present

## 2013-04-11 LAB — COMPREHENSIVE METABOLIC PANEL
ALBUMIN: 3.6 g/dL (ref 3.5–5.2)
ALT: 15 U/L (ref 0–35)
AST: 27 U/L (ref 0–37)
Alkaline Phosphatase: 86 U/L (ref 39–117)
BUN: 15 mg/dL (ref 6–23)
CALCIUM: 8.7 mg/dL (ref 8.4–10.5)
CHLORIDE: 105 meq/L (ref 96–112)
CO2: 26 meq/L (ref 19–32)
Creat: 1 mg/dL (ref 0.50–1.10)
Glucose, Bld: 85 mg/dL (ref 70–99)
Potassium: 4.2 mEq/L (ref 3.5–5.3)
Sodium: 140 mEq/L (ref 135–145)
Total Bilirubin: 0.8 mg/dL (ref 0.2–1.2)
Total Protein: 6.5 g/dL (ref 6.0–8.3)

## 2013-04-11 MED ORDER — CYANOCOBALAMIN 1000 MCG/ML IJ SOLN
1000.0000 ug | Freq: Once | INTRAMUSCULAR | Status: AC
  Administered 2013-04-11: 1000 ug via INTRAMUSCULAR

## 2013-04-11 NOTE — Progress Notes (Signed)
Patient informed. She has an Albuterol inhaler at home and does know how to use it.

## 2013-04-11 NOTE — Progress Notes (Signed)
Hematology and Oncology Follow Up Visit  Erika Murphy [id number] [birth year] 63 y.o. 04/11/2013   Principle Diagnosis:  1. Stage II (T3 N0 M0) adenocarcinoma of the stomach, clinical     remission. 2. Recurrent iron deficiency-anemia. 3. Multiple sclerosis.  Current Therapy:   IV iron as indicated.     Interim History:  Ms.  Erika Murphy is back for followup. We see her every 6 months. She has been complaining of being short of breath. This has been going on for a few weeks. There has been no cough. She's had no fever. There's been no chest wall pain.  She's had no flareups of the multiple sclerosis.  There's been no nausea vomiting. Her appetite has been down a little bit.  She's had no rashes. She's had no change in bowel or bladder habits.    Medications: Current outpatient prescriptions:Calcium Carbonate-Vit D-Min (CALTRATE PLUS PO), Take by mouth 2 (two) times daily at 10 AM and 5 PM.  , Disp: , Rfl: ;  Cholecalciferol (VITAMIN D) 1000 UNITS capsule, Take 1,000 Units by mouth daily.  , Disp: , Rfl: ;  diazepam (VALIUM) 5 MG tablet, Take 5 mg by mouth as needed., Disp: , Rfl: ;  lansoprazole (PREVACID) 30 MG capsule, Take 1 capsule (30 mg total) by mouth daily., Disp: 90 capsule, Rfl: 3 LYRICA 50 MG capsule, TAKE ONE CAPSULE 3 TIMES A DAY, Disp: 90 capsule, Rfl: 5  Allergies: No Known Allergies  Past Medical History, Surgical history, Social history, and Family History were reviewed and updated.  Review of Systems: As above  Physical Exam:  height is 5\' 3"  (1.6 m) and weight is 98 lb (44.453 kg). Her oral temperature is 98.3 F (36.8 C). Her blood pressure is 141/60 and her pulse is 64. Her respiration is 14.   Thin African  American female. Her lungs show decent breath sounds bilaterally. Shows no rales wheezes or rhonchi. Cardiac exam regular rate and rhythm with no murmurs rubs or bruits. Neck exam shows no lymphadenopathy in the neck. Thyroid is nonpalpable. Oral exam shows some  slightly dry oral mucosa. Abdomen is soft. She has well-healed laparotomy scar. There is no fluid wave. There is no palpable liver or spleen tip. Extremities shows no clubbing cyanosis or edema. Strength is 4/5 bilaterally. She has no joint swelling or erythema. She's good pulses in her distal extremities. Neurological exam shows no obvious neurological deficits.  Lab Results  Component Value Date   WBC 3.5* 04/07/2013   HGB 12.7 04/07/2013   HCT 39.2 04/07/2013   MCV 93 04/07/2013   PLT 177 04/07/2013     Chemistry      Component Value Date/Time   NA 140 12/16/2012 1229   K 4.2 12/16/2012 1229   CL 106 12/16/2012 1229   CO2 30 12/16/2012 1229   BUN 17 12/16/2012 1229   CREATININE 1.16* 12/16/2012 1229   CREATININE 1.13* 10/07/2012 1519      Component Value Date/Time   CALCIUM 9.4 12/16/2012 1229   ALKPHOS 113 12/16/2012 1229   AST 26 12/16/2012 1229   ALT 18 12/16/2012 1229   BILITOT 0.7 12/16/2012 1229      We did a chest x-ray on her. This did not show any obvious pulmonary abnormalities. No evidence of congestive heart failure. No infiltrate.  Studies showed a ferritin of 114. Iron saturation is 31%.   Impression and Plan: [name] is 63 year old African female. She is a history of stage II gastric cancer. She  is cured of this from my point of view. This has now been 14-15 years.  , Sure why she has the shortness of breath. I don't see anything on her chest x-ray. Although this might be somehow related to the multiple sclerosis.  Her iron studies look okay. She's not anemic.  We did check a high saturation on her. This was 98% on room air. As such, I don't think this is indicative of any type of embolic disease.  If this continues, that she may need to have some pulmonary function tests done.  I want to see her back in another month or so just to followup with this.     [name], MD 4/10/20156:31 AM

## 2013-04-11 NOTE — Patient Instructions (Addendum)
Have CT of the chest today. Labs are pending. B12 injection due next week.

## 2013-04-11 NOTE — Progress Notes (Signed)
   Subjective:    Patient ID: Erika Murphy, female    DOB: [birth year], 63 y.o.   MRN: [id number]  HPI  Patient has multiple sclerosis and history of gastric cancer. She is followed by Dr. Jonette Eva saw her earlier this week. She is complaining of shortness of breath.  She has lost from 105 pounds in December to  98.5 pounds today. She weighed 102 pounds in October. She says her appetite is good. She says that she has some heaviness in her chest with some difficulty breathing but recent chest x-ray was normal. Pulse oximetry is 99%. She does not want to be on medication for multiple sclerosis. She takes Lyrica for pain associated with MS. She says Lyrica works well.    Review of Systems     Objective:   Physical Exam TMs are clear. Pharynx is clear. Neck is supple without significant adenopathy JVD or thyromegaly. Chest is clear to auscultation. No lower extremity edema. Pulse ox is 99% on room air.        Assessment & Plan:  Unexplained dyspnea-recent chest x-ray negative  Weight loss-etiology unclear. Apparent 7 pound weight loss since December despite good appetite.  Plan: Patient to have CT of chest today with contrast. Gets monthly B12 injections. C- met and TSH drawn today. Other labs done earlier this week were within normal limits/stable including iron levels and CBC.

## 2013-04-12 LAB — TSH: TSH: 1.04 u[IU]/mL (ref 0.350–4.500)

## 2013-04-17 ENCOUNTER — Ambulatory Visit: Payer: Medicare Other | Admitting: Internal Medicine

## 2013-05-09 ENCOUNTER — Other Ambulatory Visit: Payer: Self-pay | Admitting: Internal Medicine

## 2013-05-21 ENCOUNTER — Other Ambulatory Visit: Payer: Self-pay | Admitting: *Deleted

## 2013-05-21 DIAGNOSIS — D002 Carcinoma in situ of stomach: Secondary | ICD-10-CM

## 2013-05-22 ENCOUNTER — Ambulatory Visit (HOSPITAL_BASED_OUTPATIENT_CLINIC_OR_DEPARTMENT_OTHER): Payer: Medicare Other | Admitting: Hematology & Oncology

## 2013-05-22 ENCOUNTER — Encounter: Payer: Self-pay | Admitting: Hematology & Oncology

## 2013-05-22 ENCOUNTER — Other Ambulatory Visit (HOSPITAL_BASED_OUTPATIENT_CLINIC_OR_DEPARTMENT_OTHER): Payer: Medicare Other | Admitting: Lab

## 2013-05-22 ENCOUNTER — Telehealth: Payer: Self-pay | Admitting: Hematology & Oncology

## 2013-05-22 VITALS — BP 144/65 | HR 57 | Temp 98.0°F | Resp 14 | Ht 63.0 in | Wt 100.0 lb

## 2013-05-22 DIAGNOSIS — D002 Carcinoma in situ of stomach: Secondary | ICD-10-CM | POA: Diagnosis not present

## 2013-05-22 DIAGNOSIS — Z85028 Personal history of other malignant neoplasm of stomach: Secondary | ICD-10-CM | POA: Diagnosis not present

## 2013-05-22 DIAGNOSIS — C169 Malignant neoplasm of stomach, unspecified: Secondary | ICD-10-CM

## 2013-05-22 DIAGNOSIS — D509 Iron deficiency anemia, unspecified: Secondary | ICD-10-CM

## 2013-05-22 LAB — RETICULOCYTES (CHCC)
ABS RETIC: 44.3 10*3/uL (ref 19.0–186.0)
RBC.: 4.03 MIL/uL (ref 3.87–5.11)
Retic Ct Pct: 1.1 % (ref 0.4–2.3)

## 2013-05-22 LAB — CBC WITH DIFFERENTIAL (CANCER CENTER ONLY)
BASO#: 0 10*3/uL (ref 0.0–0.2)
BASO%: 0.6 % (ref 0.0–2.0)
EOS%: 2.8 % (ref 0.0–7.0)
Eosinophils Absolute: 0.1 10*3/uL (ref 0.0–0.5)
HEMATOCRIT: 37 % (ref 34.8–46.6)
HGB: 12.2 g/dL (ref 11.6–15.9)
LYMPH#: 1.2 10*3/uL (ref 0.9–3.3)
LYMPH%: 36.1 % (ref 14.0–48.0)
MCH: 30.4 pg (ref 26.0–34.0)
MCHC: 33 g/dL (ref 32.0–36.0)
MCV: 92 fL (ref 81–101)
MONO#: 0.4 10*3/uL (ref 0.1–0.9)
MONO%: 11.6 % (ref 0.0–13.0)
NEUT#: 1.6 10*3/uL (ref 1.5–6.5)
NEUT%: 48.9 % (ref 39.6–80.0)
Platelets: 165 10*3/uL (ref 145–400)
RBC: 4.01 10*6/uL (ref 3.70–5.32)
RDW: 11.9 % (ref 11.1–15.7)
WBC: 3.2 10*3/uL — ABNORMAL LOW (ref 3.9–10.0)

## 2013-05-22 LAB — IRON AND TIBC CHCC
%SAT: 25 % (ref 21–57)
Iron: 78 ug/dL (ref 41–142)
TIBC: 314 ug/dL (ref 236–444)
UIBC: 236 ug/dL (ref 120–384)

## 2013-05-22 LAB — FERRITIN CHCC: FERRITIN: 97 ng/mL (ref 9–269)

## 2013-05-22 NOTE — Telephone Encounter (Signed)
Mailed 11-2013 schedule °

## 2013-05-22 NOTE — Progress Notes (Signed)
Hematology and Oncology Follow Up Visit  Erika Murphy [id number] 09/13/1950 63 y.o. 05/22/2013   Principle Diagnosis:   Stage II (T3N0M0) adenocarcinoma of the stomach-remission Recurrent iron deficiency anemia  Multiple sclerosis Current Therapy:    IV iron as indicated     Interim History:  [name] is is doing well. I saw her about a month or so ago. 2000 shortness of breath. We did do a CT angiogram on her. This was negative for any blood clot. She had no effusion. There is no masses. There is no cardiomegaly. This does seem to be a little better.  She's had no abdominal pain. Her appetite is doing a little better. She's gained a little weight. She's had no flareups of her multiple sclerosis.  Medications: Current outpatient prescriptions:Calcium Carbonate-Vit D-Min (CALTRATE PLUS PO), Take by mouth 2 (two) times daily at 10 AM and 5 PM.  , Disp: , Rfl: ;  Cholecalciferol (VITAMIN D) 1000 UNITS capsule, Take 1,000 Units by mouth daily.  , Disp: , Rfl: ;  diazepam (VALIUM) 5 MG tablet, Take 5 mg by mouth as needed., Disp: , Rfl: ;  lansoprazole (PREVACID) 30 MG capsule, Take 1 capsule (30 mg total) by mouth daily., Disp: 90 capsule, Rfl: 3 LYRICA 50 MG capsule, TAKE ONE CAPSULE 3 TIMES A DAY, Disp: 90 capsule, Rfl: 5;  Multiple Vitamin (MULTIVITAMIN) tablet, Take 1 tablet by mouth daily., Disp: , Rfl: ;  PROAIR HFA 108 (90 BASE) MCG/ACT inhaler, INHALE 2 SPRAYS BY MOUTH 4 TIMES A DAY AS NEEDED., Disp: 8.5 each, Rfl: 1  Allergies: No Known Allergies  Past Medical History, Surgical history, Social history, and Family History were reviewed and updated.  Review of Systems: As above  Physical Exam:  height is 5\' 3"  (1.6 m) and weight is 100 lb (45.36 kg). Her oral temperature is 98 F (36.7 C). Her blood pressure is 144/65 and her pulse is 57. Her respiration is 14.   Thin African female. Lungs are clear. Cardiac exam regular rate rhythm. Abdomen soft. Good bowel sounds. There is no  fluid wave. She'll well-healed laparotomy scar. There is no palpable liver or spleen tip. Back exam no tenderness over the spine ribs or hips. Extremities shows a symmetric most likely bilaterally. She has 4+/5 strength in her extremities. Skin exam no rashes.  Lab Results  Component Value Date   WBC 3.2* 05/22/2013   HGB 12.2 05/22/2013   HCT 37.0 05/22/2013   MCV 92 05/22/2013   PLT 165 05/22/2013     Chemistry      Component Value Date/Time   NA 140 04/11/2013 1040   K 4.2 04/11/2013 1040   CL 105 04/11/2013 1040   CO2 26 04/11/2013 1040   BUN 15 04/11/2013 1040   CREATININE 1.00 04/11/2013 1040   CREATININE 1.13* 10/07/2012 1519      Component Value Date/Time   CALCIUM 8.7 04/11/2013 1040   ALKPHOS 86 04/11/2013 1040   AST 27 04/11/2013 1040   ALT 15 04/11/2013 1040   BILITOT 0.8 04/11/2013 1040     Ferritin is 97. Iron saturation 25%. Total iron 78.    Impression and Plan: Erika Murphy is a 63 year old African [place] female. She had a history of stage II stomach cancer. She underwent resection. She had adjuvant therapy. This is now 14 years in the past. She is adequately well. I really think she is cured of the stomach cancer.  We will go ahead and plan for another  followup in 4 months.  She does not need any iron for my point of view.   [name], MD 5/21/20157:02 PM

## 2013-05-27 ENCOUNTER — Ambulatory Visit (INDEPENDENT_AMBULATORY_CARE_PROVIDER_SITE_OTHER): Payer: Medicare Other | Admitting: Internal Medicine

## 2013-05-27 DIAGNOSIS — E538 Deficiency of other specified B group vitamins: Secondary | ICD-10-CM | POA: Diagnosis not present

## 2013-05-27 MED ORDER — CYANOCOBALAMIN 1000 MCG/ML IJ SOLN
1000.0000 ug | Freq: Once | INTRAMUSCULAR | Status: AC
  Administered 2013-05-27: 1000 ug via INTRAMUSCULAR

## 2013-06-27 ENCOUNTER — Ambulatory Visit (INDEPENDENT_AMBULATORY_CARE_PROVIDER_SITE_OTHER): Payer: Medicare Other | Admitting: Internal Medicine

## 2013-06-27 DIAGNOSIS — E538 Deficiency of other specified B group vitamins: Secondary | ICD-10-CM | POA: Diagnosis not present

## 2013-06-27 MED ORDER — CYANOCOBALAMIN 1000 MCG/ML IJ SOLN
1000.0000 ug | Freq: Once | INTRAMUSCULAR | Status: AC
  Administered 2013-06-27: 1000 ug via INTRAMUSCULAR

## 2013-07-25 ENCOUNTER — Encounter: Payer: Self-pay | Admitting: Internal Medicine

## 2013-07-25 ENCOUNTER — Ambulatory Visit (INDEPENDENT_AMBULATORY_CARE_PROVIDER_SITE_OTHER): Payer: Medicare Other | Admitting: Internal Medicine

## 2013-07-25 VITALS — BP 132/74 | HR 72 | Temp 98.7°F | Wt 101.5 lb

## 2013-07-25 DIAGNOSIS — E538 Deficiency of other specified B group vitamins: Secondary | ICD-10-CM

## 2013-07-25 DIAGNOSIS — H6503 Acute serous otitis media, bilateral: Secondary | ICD-10-CM

## 2013-07-25 DIAGNOSIS — H8110 Benign paroxysmal vertigo, unspecified ear: Secondary | ICD-10-CM

## 2013-07-25 DIAGNOSIS — G35 Multiple sclerosis: Secondary | ICD-10-CM

## 2013-07-25 DIAGNOSIS — H6500 Acute serous otitis media, unspecified ear: Secondary | ICD-10-CM | POA: Diagnosis not present

## 2013-07-25 MED ORDER — METHYLPREDNISOLONE ACETATE 80 MG/ML IJ SUSP
80.0000 mg | Freq: Once | INTRAMUSCULAR | Status: AC
Start: 2013-07-25 — End: 2013-07-25
  Administered 2013-07-25: 80 mg via INTRAMUSCULAR

## 2013-07-25 MED ORDER — CYANOCOBALAMIN 1000 MCG/ML IJ SOLN
1000.0000 ug | Freq: Once | INTRAMUSCULAR | Status: AC
  Administered 2013-07-25: 1000 ug via INTRAMUSCULAR

## 2013-07-25 NOTE — Patient Instructions (Signed)
B12 injection given. Depo-Medrol 80 mg IM given for serous otitis media. Take Valium for vertigo. Return in 6 weeks for B12 injection on  return from trip to [place]

## 2013-07-25 NOTE — Progress Notes (Signed)
   Subjective:    Patient ID: Erika Murphy, female    DOB: [birth year], 63 y.o.   MRN: [id number]  HPI  Patient came in today for B12 injection. Patient complaining of some ear discomfort and vertigo-type symptoms. She has a history of multiple sclerosis. No fever or shaking chills. No headache. Has felt unsteady on her feet and a bit dizzy.    Review of Systems     Objective:   Physical Exam  PERRLA. Alert and oriented x3. Affect is normal. TMs are slightly full bilaterally. They are not red. Neck is supple without adenopathy. Chest clear to auscultation.      Assessment & Plan:  Bilateral serous otitis media  Vertigo  Plan: Patient has IM on hand to take which will treat vertigo. She take this at night for several days. Depo-Medrol 80 mg IM given for bilateral serous otitis media. Patient going to [place] in August for vacation.  15 minutes spent with patient

## 2013-08-03 ENCOUNTER — Other Ambulatory Visit: Payer: Self-pay | Admitting: Internal Medicine

## 2013-09-12 ENCOUNTER — Ambulatory Visit: Payer: Medicare Other | Admitting: Nurse Practitioner

## 2013-09-12 ENCOUNTER — Ambulatory Visit: Payer: Medicare Other | Admitting: Neurology

## 2013-09-12 ENCOUNTER — Ambulatory Visit: Payer: Medicare Other | Admitting: Internal Medicine

## 2013-09-16 ENCOUNTER — Encounter (INDEPENDENT_AMBULATORY_CARE_PROVIDER_SITE_OTHER): Payer: Self-pay

## 2013-09-16 ENCOUNTER — Encounter: Payer: Self-pay | Admitting: Neurology

## 2013-09-16 ENCOUNTER — Ambulatory Visit (INDEPENDENT_AMBULATORY_CARE_PROVIDER_SITE_OTHER): Payer: Medicare Other | Admitting: Neurology

## 2013-09-16 VITALS — BP 121/79 | HR 71 | Ht 64.0 in | Wt 101.0 lb

## 2013-09-16 DIAGNOSIS — G35 Multiple sclerosis: Secondary | ICD-10-CM | POA: Diagnosis not present

## 2013-09-16 MED ORDER — PREGABALIN 50 MG PO CAPS: 50.0000 mg | ORAL_CAPSULE | Freq: Three times a day (TID) | ORAL | Status: DC

## 2013-09-16 NOTE — Progress Notes (Signed)
[facility]  PATIENT: Erika Murphy DOB: 02/01/1950  HISTORICAL  Erika Murphy is a 63 years old right-handed African American female, patient of Dr. Erling Cruz, followed up for multiple sclerosis, last clinical visit was Sep 2014  Her primary care physician is Dr. Tommie Ard Baxley  She was diagnosed with multiple sclerosis in 1998, presented with spastic gait disorder, was treated with Betaseron since 1998, which was stopped in 2011 due to abnormal liver functional test, her abnormal liver function test did recover back to normal after stopping Betaseron.  Her multiple sclerosis symptoms have been  fairly stable over years, there was no worsening of her symptoms without taking Betaseron and baclofen, she is only taking Lyrica 50 mg 3 times a day for neuropathic pain involving bilateral upper and lower extremities.  Since July 2014, she also had intermittent left shoulder radiating pain to her left lateral arm, left hand, sometimes woke her up from sleep, intermittent, there was no significant worsening of weakness.  She also had a history of left shoulder replacement in 2011,  She has a history of gastric adenocarcinoma, status post subtotal gastrectomy, partial omentectomy in March 20001, status post chemotherapy and radiation therapy.  Most recent MRI of the brain was in June 2013, small white matter hyperintensities are unchanged from 2010. No enhancing or restricted diffusion lesions are seen. These lesions are nonspecific and could be seen with multiple sclerosis or chronic microvascular ischemia most abnormality is seen in MRI of cervical spine, most recently in 2011,  MRI scan of the cervical spine in 2011 showed prominent spondylitic changes from C4-6 with mild left sided foramnial stenosis at C4-5, C5-6 and bilateral foraminal stenosis at C6-7. Illdefined spinal cord hyperintensities at C5-6 and T2-3 likely represent remote age demyelinating plaque  She lives alone, ambulate  with a cane, no bowel bladder incontinence.  UPDATE Sep 15th 2015: She is dong well, still lives alone in a house with couple steps, using cane to ambulate, she never drive. She is taking Lyrica 50 mg 3 times a day for her upper and lower extremity pain, which has been very helpful, taking Valium 5 mg as needed,  Over the years, her multiple sclerosis symptoms has been very stable, there is no worsening of her gait problem.  REVIEW OF SYSTEMS: Full 14 system review of systems performed and notable only for numbness, fatigue, cramps, achy muscles, decreased energy   ALLERGIES: No Known Allergies  HOME MEDICATIONS: Outpatient Prescriptions Prior to Visit  Medication Sig Dispense Refill  . Calcium Carbonate-Vit D-Min (CALTRATE PLUS PO) Take by mouth 2 (two) times daily at 10 AM and 5 PM.        . Cholecalciferol (VITAMIN D) 1000 UNITS capsule Take 1,000 Units by mouth daily.        . diazepam (VALIUM) 5 MG tablet Take 5 mg by mouth as needed.      . lansoprazole (PREVACID) 30 MG capsule TAKE 1 CAPSULE (30 MG TOTAL) BY MOUTH DAILY.  90 capsule  3  . LYRICA 50 MG capsule TAKE ONE CAPSULE 3 TIMES A DAY  90 capsule  5  . Multiple Vitamin (MULTIVITAMIN) tablet Take 1 tablet by mouth daily.      Marland Kitchen PROAIR HFA 108 (90 BASE) MCG/ACT inhaler INHALE 2 SPRAYS BY MOUTH 4 TIMES A DAY AS NEEDED.  8.5 each  1   No facility-administered medications prior to visit.    PAST MEDICAL HISTORY: Past Medical History  Diagnosis Date  . Cancer   .  MS (multiple sclerosis)   . Osteopenia   . Vitamin B12 deficiency   . Thyroid disease     rt thyroid nodule  . IBS (irritable bowel syndrome)   . Neurogenic bladder     PAST SURGICAL HISTORY: Past Surgical History  Procedure Laterality Date  . Partial gastrectomy      partial omentectomy    FAMILY HISTORY: Family History  Problem Relation Age of Onset  . Heart disease Mother   . Diabetes Father     SOCIAL HISTORY:  History   Social History  .  Marital Status: Divorced    Spouse Name: N/A    Number of Children: 3   . Years of Education: N/A   Occupational History  . DISABLED    Social History Main Topics  . Smoking status: Never Smoker   . Smokeless tobacco: Never Used     Comment: never used tobacco  . Alcohol Use: No  . Drug Use: Not on file  . Sexual Activity: Not on file   Other Topics Concern  . Not on file   Social History Narrative   Patient is retired.    Patient is married with 3 children.   Patient drinks caffeine daily.      PHYSICAL EXAM    Filed Vitals:   09/16/13 1431  BP: 121/79  Pulse: 71  Height: 5' 4" (1.626 m)  Weight: 101 lb (45.813 kg)     Body mass index is 17.33 kg/(m^2).   Generalized: In no acute distress  Neck: Supple, no carotid bruits   Cardiac: Regular rate rhythm  Pulmonary: Clear to auscultation bilaterally  Musculoskeletal: No deformity  Neurological examination  Mentation: Alert oriented to time, place, history taking, and causual conversation,  Very thin  Cranial nerve II-XII: Pupils were equal round reactive to light extraocular movements were full, visual field were full on confrontational test. facial sensation and strength were normal. hearing was intact to finger rubbing bilaterally. Uvula tongue midline.  head turning and shoulder shrug and were normal and symmetric.Tongue protrusion into cheek strength was normal.  Motor: bilateral upper extremity motor strength was normal, moderate spasticity of bilateral lower extremity. bilateral lower extremity (right/left), hip flexion 4/4+, knee flexion 4/5, knee extension 4+/5, ankle dorsiflexion 4/5 ankle plantar flexion 4+/5,    Sensory: Intact to fine touch, pinprick, preserved vibratory sensation, and proprioception at toes.  Coordination: Normal finger to nose, heel-to-shin bilaterally there was no truncal ataxia  Gait: Needed assistance to get up from seated position, spastic, stiff, cautious gait. Romberg  signs: Negative  Deep tendon reflexes: Brachioradialis 2/2, biceps 2/2, triceps 2/2, patellar 3/3, Achilles 2/2, plantar responses were flexor bilaterally.   DIAGNOSTIC DATA (LABS, IMAGING, TESTING) - I reviewed patient records, labs, notes, testing and imaging myself where available.  Lab Results  Component Value Date   WBC 3.2* 05/22/2013   HGB 12.2 05/22/2013   HCT 37.0 05/22/2013   MCV 92 05/22/2013   PLT 165 05/22/2013      Component Value Date/Time   NA 140 04/11/2013 1040   K 4.2 04/11/2013 1040   CL 105 04/11/2013 1040   CO2 26 04/11/2013 1040   GLUCOSE 85 04/11/2013 1040   BUN 15 04/11/2013 1040   CREATININE 1.00 04/11/2013 1040   CREATININE 1.13* 10/07/2012 1519   CALCIUM 8.7 04/11/2013 1040   PROT 6.5 04/11/2013 1040   ALBUMIN 3.6 04/11/2013 1040   AST 27 04/11/2013 1040   ALT 15 04/11/2013 1040   ALKPHOS 86  04/11/2013 1040   BILITOT 0.8 04/11/2013 1040   GFRNONAA >60 09/25/2009 0450   GFRAA  Value: >60        The eGFR has been calculated using the MDRD equation. This calculation has not been validated in all clinical situations. eGFR's persistently <60 mL/min signify possible Chronic Kidney Disease. 09/25/2009 0450   Lab Results  Component Value Date   VITAMINB12 648 09/10/2012   Lab Results  Component Value Date   TSH 1.040 04/11/2013     ASSESSMENT AND PLAN   63 years old right-handed African American female, with past medical history of relapsing remitting multiple sclerosis, was treated with Betaseron from 1998 to 2011, developed abnormal liver function in the past, which has recovered after stopping Betaseron, the abnormalities are mainly at the cervical spine, detailed above, minimal findings on MRI of the brain, symptoms has been stable for many years. She also had a history of gastric adenocarcinoma, status post gastrectomy, followed by chemotherapy and radiation therapy in 2001,   Her MS symptoms has been fairly stable, moderate bilateral lower extremity spasticity, mild  to moderate gait difficulty,  Return to clinic in one year  I have discussed with patient for Baclofen ER trial.  She agrees, our office Amy will contact her for baseline visit.   Marcial Pacas, M.D. Ph.D.  [facility] [address] [place], [place] [address] [phone]

## 2013-09-18 DIAGNOSIS — H25019 Cortical age-related cataract, unspecified eye: Secondary | ICD-10-CM | POA: Diagnosis not present

## 2013-09-18 DIAGNOSIS — H524 Presbyopia: Secondary | ICD-10-CM | POA: Diagnosis not present

## 2013-09-18 DIAGNOSIS — H2510 Age-related nuclear cataract, unspecified eye: Secondary | ICD-10-CM | POA: Diagnosis not present

## 2013-09-25 ENCOUNTER — Ambulatory Visit (INDEPENDENT_AMBULATORY_CARE_PROVIDER_SITE_OTHER): Payer: Medicare Other | Admitting: Internal Medicine

## 2013-09-25 ENCOUNTER — Encounter: Payer: Self-pay | Admitting: Internal Medicine

## 2013-09-25 VITALS — BP 118/78 | HR 78 | Temp 97.0°F | Ht 64.0 in | Wt 101.0 lb

## 2013-09-25 DIAGNOSIS — Z23 Encounter for immunization: Secondary | ICD-10-CM | POA: Diagnosis not present

## 2013-09-25 DIAGNOSIS — E538 Deficiency of other specified B group vitamins: Secondary | ICD-10-CM | POA: Diagnosis not present

## 2013-09-25 MED ORDER — CYANOCOBALAMIN 1000 MCG/ML IJ SOLN
1000.0000 ug | Freq: Once | INTRAMUSCULAR | Status: AC
  Administered 2013-09-25: 1000 ug via INTRAMUSCULAR

## 2013-09-25 NOTE — Progress Notes (Signed)
Patient seen today for B12 injection.  She is doing well.  1000 mcg/ml given in right glut.  Patient tolerated well.

## 2013-09-29 ENCOUNTER — Other Ambulatory Visit: Payer: Self-pay | Admitting: Internal Medicine

## 2013-09-29 NOTE — Telephone Encounter (Signed)
Lyrica rx called [facility]

## 2013-09-29 NOTE — Telephone Encounter (Signed)
See if this can be called in for 6 months or does it require written RX?

## 2013-10-08 ENCOUNTER — Telehealth: Payer: Self-pay | Admitting: Neurology

## 2013-10-08 ENCOUNTER — Telehealth: Payer: Self-pay

## 2013-10-08 MED ORDER — BACLOFEN 10 MG PO TABS: 10.0000 mg | ORAL_TABLET | Freq: Three times a day (TID) | ORAL | Status: DC

## 2013-10-08 NOTE — Telephone Encounter (Signed)
I called patient to R/S her [employer] 11_04 visit as she needs to be on Baclofen x30 days prior to first study visit.   She has been rescheduled to Nov. 12, 2015 at 10:00.

## 2013-10-08 NOTE — Telephone Encounter (Signed)
I have called her. That she had a history of using baclofen in the past 7-8 years ago, she recently began to take baclofen again in Sept 2015, which did her cramps and spasm.  She has tried baclofen 10mg  tid, starting today, will reschedule her screening visit a month later.

## 2013-10-09 ENCOUNTER — Inpatient Hospital Stay (HOSPITAL_COMMUNITY)
Admission: EM | Admit: 2013-10-09 | Discharge: 2013-10-12 | Disposition: A | Discharge status: Home Health | Payer: Medicare Other | Attending: Internal Medicine | Admitting: Internal Medicine

## 2013-10-09 ENCOUNTER — Telehealth: Payer: Self-pay

## 2013-10-09 ENCOUNTER — Encounter (HOSPITAL_COMMUNITY): Payer: Self-pay | Admitting: Emergency Medicine

## 2013-10-09 ENCOUNTER — Emergency Department (HOSPITAL_COMMUNITY): Payer: Medicare Other

## 2013-10-09 ENCOUNTER — Observation Stay (HOSPITAL_COMMUNITY): Payer: Medicare Other

## 2013-10-09 DIAGNOSIS — Z85028 Personal history of other malignant neoplasm of stomach: Secondary | ICD-10-CM | POA: Diagnosis not present

## 2013-10-09 DIAGNOSIS — R079 Chest pain, unspecified: Secondary | ICD-10-CM | POA: Diagnosis not present

## 2013-10-09 DIAGNOSIS — R531 Weakness: Secondary | ICD-10-CM | POA: Diagnosis not present

## 2013-10-09 DIAGNOSIS — Z903 Acquired absence of stomach [part of]: Secondary | ICD-10-CM | POA: Diagnosis present

## 2013-10-09 DIAGNOSIS — R112 Nausea with vomiting, unspecified: Secondary | ICD-10-CM

## 2013-10-09 DIAGNOSIS — F419 Anxiety disorder, unspecified: Secondary | ICD-10-CM | POA: Diagnosis present

## 2013-10-09 DIAGNOSIS — Z79899 Other long term (current) drug therapy: Secondary | ICD-10-CM

## 2013-10-09 DIAGNOSIS — G8929 Other chronic pain: Secondary | ICD-10-CM | POA: Diagnosis present

## 2013-10-09 DIAGNOSIS — R1111 Vomiting without nausea: Secondary | ICD-10-CM

## 2013-10-09 DIAGNOSIS — G35 Multiple sclerosis: Principal | ICD-10-CM | POA: Diagnosis present

## 2013-10-09 DIAGNOSIS — R269 Unspecified abnormalities of gait and mobility: Secondary | ICD-10-CM | POA: Diagnosis not present

## 2013-10-09 DIAGNOSIS — R202 Paresthesia of skin: Secondary | ICD-10-CM | POA: Diagnosis not present

## 2013-10-09 DIAGNOSIS — N319 Neuromuscular dysfunction of bladder, unspecified: Secondary | ICD-10-CM | POA: Diagnosis present

## 2013-10-09 DIAGNOSIS — I6782 Cerebral ischemia: Secondary | ICD-10-CM | POA: Diagnosis not present

## 2013-10-09 DIAGNOSIS — E041 Nontoxic single thyroid nodule: Secondary | ICD-10-CM | POA: Diagnosis present

## 2013-10-09 DIAGNOSIS — R1110 Vomiting, unspecified: Secondary | ICD-10-CM | POA: Diagnosis not present

## 2013-10-09 DIAGNOSIS — G35D Multiple sclerosis, unspecified: Secondary | ICD-10-CM | POA: Diagnosis present

## 2013-10-09 DIAGNOSIS — M47812 Spondylosis without myelopathy or radiculopathy, cervical region: Secondary | ICD-10-CM | POA: Diagnosis not present

## 2013-10-09 DIAGNOSIS — S14156A Other incomplete lesion at C6 level of cervical spinal cord, initial encounter: Secondary | ICD-10-CM | POA: Diagnosis not present

## 2013-10-09 HISTORY — DX: Age-related osteoporosis without current pathological fracture: M81.0

## 2013-10-09 HISTORY — DX: Fibromyalgia: M79.7

## 2013-10-09 HISTORY — DX: Gastro-esophageal reflux disease without esophagitis: K21.9

## 2013-10-09 HISTORY — DX: Malignant neoplasm of stomach, unspecified: C16.9

## 2013-10-09 HISTORY — DX: Vitamin B12 deficiency anemia, unspecified: D51.9

## 2013-10-09 HISTORY — DX: Unspecified asthma, uncomplicated: J45.909

## 2013-10-09 HISTORY — DX: Personal history of other medical treatment: Z92.89

## 2013-10-09 HISTORY — DX: Unspecified osteoarthritis, unspecified site: M19.90

## 2013-10-09 HISTORY — DX: Nontoxic single thyroid nodule: E04.1

## 2013-10-09 LAB — CBC
HEMATOCRIT: 34.3 % — AB (ref 36.0–46.0)
HEMOGLOBIN: 11.1 g/dL — AB (ref 12.0–15.0)
MCH: 29.3 pg (ref 26.0–34.0)
MCHC: 32.4 g/dL (ref 30.0–36.0)
MCV: 90.5 fL (ref 78.0–100.0)
Platelets: 168 10*3/uL (ref 150–400)
RBC: 3.79 MIL/uL — AB (ref 3.87–5.11)
RDW: 12.9 % (ref 11.5–15.5)
WBC: 3.8 10*3/uL — ABNORMAL LOW (ref 4.0–10.5)

## 2013-10-09 LAB — URINALYSIS, ROUTINE W REFLEX MICROSCOPIC
Bilirubin Urine: NEGATIVE
Glucose, UA: NEGATIVE mg/dL
Hgb urine dipstick: NEGATIVE
KETONES UR: 15 mg/dL — AB
NITRITE: NEGATIVE
Protein, ur: NEGATIVE mg/dL
SPECIFIC GRAVITY, URINE: 1.023 (ref 1.005–1.030)
UROBILINOGEN UA: 1 mg/dL (ref 0.0–1.0)
pH: 5.5 (ref 5.0–8.0)

## 2013-10-09 LAB — TSH: TSH: 0.314 u[IU]/mL — ABNORMAL LOW (ref 0.350–4.500)

## 2013-10-09 LAB — COMPREHENSIVE METABOLIC PANEL
ALBUMIN: 3.5 g/dL (ref 3.5–5.2)
ALT: 15 U/L (ref 0–35)
ANION GAP: 11 (ref 5–15)
AST: 25 U/L (ref 0–37)
Alkaline Phosphatase: 94 U/L (ref 39–117)
BUN: 13 mg/dL (ref 6–23)
CO2: 26 mEq/L (ref 19–32)
CREATININE: 1.02 mg/dL (ref 0.50–1.10)
Calcium: 9.2 mg/dL (ref 8.4–10.5)
Chloride: 109 mEq/L (ref 96–112)
GFR calc non Af Amer: 56 mL/min — ABNORMAL LOW (ref >90)
GFR, EST AFRICAN AMERICAN: 65 mL/min — AB (ref >90)
Glucose, Bld: 99 mg/dL (ref 70–99)
Potassium: 4 mEq/L (ref 3.7–5.3)
Sodium: 146 mEq/L (ref 137–147)
TOTAL PROTEIN: 7.3 g/dL (ref 6.0–8.3)
Total Bilirubin: 0.4 mg/dL (ref 0.3–1.2)

## 2013-10-09 LAB — URINE MICROSCOPIC-ADD ON

## 2013-10-09 LAB — CBC WITH DIFFERENTIAL/PLATELET
BASOS PCT: 0 % (ref 0–1)
Basophils Absolute: 0 10*3/uL (ref 0.0–0.1)
EOS ABS: 0 10*3/uL (ref 0.0–0.7)
EOS PCT: 0 % (ref 0–5)
HEMATOCRIT: 34.9 % — AB (ref 36.0–46.0)
HEMOGLOBIN: 11.5 g/dL — AB (ref 12.0–15.0)
Lymphocytes Relative: 15 % (ref 12–46)
Lymphs Abs: 0.7 10*3/uL (ref 0.7–4.0)
MCH: 29.8 pg (ref 26.0–34.0)
MCHC: 33 g/dL (ref 30.0–36.0)
MCV: 90.4 fL (ref 78.0–100.0)
Monocytes Absolute: 0.3 10*3/uL (ref 0.1–1.0)
Monocytes Relative: 6 % (ref 3–12)
Neutro Abs: 3.6 10*3/uL (ref 1.7–7.7)
Neutrophils Relative %: 79 % — ABNORMAL HIGH (ref 43–77)
Platelets: 181 10*3/uL (ref 150–400)
RBC: 3.86 MIL/uL — ABNORMAL LOW (ref 3.87–5.11)
RDW: 12.9 % (ref 11.5–15.5)
WBC: 4.5 10*3/uL (ref 4.0–10.5)

## 2013-10-09 LAB — I-STAT TROPONIN, ED: Troponin i, poc: 0 ng/mL (ref 0.00–0.08)

## 2013-10-09 LAB — CREATININE, SERUM
CREATININE: 1.02 mg/dL (ref 0.50–1.10)
GFR, EST AFRICAN AMERICAN: 65 mL/min — AB (ref >90)
GFR, EST NON AFRICAN AMERICAN: 56 mL/min — AB (ref >90)

## 2013-10-09 MED ORDER — PANTOPRAZOLE SODIUM 40 MG PO TBEC
40.0000 mg | DELAYED_RELEASE_TABLET | Freq: Every day | ORAL | Status: DC
Start: 2013-10-09 — End: 2013-10-12
  Administered 2013-10-09 – 2013-10-12 (×4): 40 mg via ORAL
  Filled 2013-10-09 (×3): qty 1

## 2013-10-09 MED ORDER — GADOBENATE DIMEGLUMINE 529 MG/ML IV SOLN
10.0000 mL | Freq: Once | INTRAVENOUS | Status: AC | PRN
  Administered 2013-10-09: 10 mL via INTRAVENOUS

## 2013-10-09 MED ORDER — ADULT MULTIVITAMIN W/MINERALS CH
1.0000 | ORAL_TABLET | Freq: Every day | ORAL | Status: DC
  Administered 2013-10-09 – 2013-10-11 (×2): 1 via ORAL
  Filled 2013-10-09 (×4): qty 1

## 2013-10-09 MED ORDER — PREGABALIN 25 MG PO CAPS
50.0000 mg | ORAL_CAPSULE | Freq: Three times a day (TID) | ORAL | Status: DC
  Administered 2013-10-09 – 2013-10-12 (×8): 50 mg via ORAL
  Filled 2013-10-09 (×8): qty 2

## 2013-10-09 MED ORDER — ONE-DAILY MULTI VITAMINS PO TABS: 1.0000 | ORAL_TABLET | Freq: Every day | ORAL | Status: DC

## 2013-10-09 MED ORDER — SODIUM CHLORIDE 0.9 % IV SOLN
1000.0000 mg | Freq: Every day | INTRAVENOUS | Status: DC
  Administered 2013-10-10 – 2013-10-11 (×3): 1000 mg via INTRAVENOUS
  Filled 2013-10-09 (×4): qty 8

## 2013-10-09 MED ORDER — ALBUTEROL SULFATE (2.5 MG/3ML) 0.083% IN NEBU: 2.5000 mg | INHALATION_SOLUTION | Freq: Four times a day (QID) | RESPIRATORY_TRACT | Status: DC | PRN

## 2013-10-09 MED ORDER — ONDANSETRON HCL 4 MG PO TABS: 4.0000 mg | ORAL_TABLET | Freq: Four times a day (QID) | ORAL | Status: DC | PRN

## 2013-10-09 MED ORDER — ALBUTEROL SULFATE HFA 108 (90 BASE) MCG/ACT IN AERS: 2.0000 | INHALATION_SPRAY | Freq: Four times a day (QID) | RESPIRATORY_TRACT | Status: DC | PRN

## 2013-10-09 MED ORDER — METHYLPREDNISOLONE SODIUM SUCC 125 MG IJ SOLR
125.0000 mg | Freq: Once | INTRAMUSCULAR | Status: AC
  Administered 2013-10-09: 125 mg via INTRAVENOUS
  Filled 2013-10-09: qty 2

## 2013-10-09 MED ORDER — HEPARIN SODIUM (PORCINE) 5000 UNIT/ML IJ SOLN
5000.0000 [IU] | Freq: Three times a day (TID) | INTRAMUSCULAR | Status: DC
  Administered 2013-10-09 – 2013-10-12 (×7): 5000 [IU] via SUBCUTANEOUS
  Filled 2013-10-09 (×12): qty 1

## 2013-10-09 MED ORDER — SODIUM CHLORIDE 0.9 % IV SOLN
INTRAVENOUS | Status: DC
  Administered 2013-10-09 – 2013-10-10 (×2): via INTRAVENOUS

## 2013-10-09 MED ORDER — ONDANSETRON HCL 4 MG/2ML IJ SOLN: 4.0000 mg | Freq: Four times a day (QID) | INTRAMUSCULAR | Status: DC | PRN

## 2013-10-09 NOTE — Progress Notes (Signed)
Received report from [place], Therapist, sports.

## 2013-10-09 NOTE — H&P (Signed)
Hospitalist Admission History and Physical  Patient name: Erika Murphy Medical record number: [id number] Date of birth: 09-04-1950 Age: 63 y.o. Gender: female  Primary Care Provider: Elby Showers, MD  Chief Complaint: MS Flare, Emesis   History of Present Illness:This is a 63 y.o. year old female with significant past medical history of MS, gastric carcinoma s/p gastrectomy presenting with MS flare and emesis. Pt has baseline history of MS with baseline R sided weakness and chronic pain. Reports worsening pain and weakness over the past day. Has also had mild distal LUE tingling that has resolved. Denies any recent strenuous activity. No recent illnessess. States that she has had MS for almost 20 years with fairly stable disease. States that she was recently put on baclofen for MS. Unsure if flare may be related to this or not. Also reports 1-2 episodes of emesis. Initial emesis was brown/?coffee ground per pt, as well as 1 episode of bilious vomiting afterword. Denies any recent NSAID intake. States that she has been stable from a GI standpoint since gastrectomy and rad x/chemo >10 years ago.  Presented to the ER  T 98, HR 30s-120s, resp 10s-20s, BP 100s-160s, Satting 80s-90s on RA, though mainly in upper 90s. WBC 4.5, Hgb 11.5, Cr 1.02. MRI brain and c spine with stable white matter and cord lesion changes. Also with 2cm R thyroid nodule. Pt given IV solumedrol x1. Neuro contacted. Recommending inpt admission for MS flare.  Assessment and Plan: Erika Murphy is a 63 y.o. year old female presenting with MS flare, emesis   Active Problems:   Multiple sclerosis   Emesis   1- MS Flare  -no new lesions on MRI -s/p IV soluemdrol  -neuro consult pending  -follow up recommendations  -cont lyrica   2- emesis  -?coffee ground emesis per pt  -gastroccult when available  -cont ppi  -higher concern for GI pathology given hx/o GI cancer s/p surgical resection and chemo-radx -consider inpt vs.  Outpt GI c/s  -CXR   3-thyroid nodule  -check TSH  -consider thyroid u/s vs. Uptake scan pending blood work   FEN/GI: heart healthy diet  Prophylaxis: sub q heparin  Disposition: pending further evaluation  Code Status:Full Code    Patient Active Problem List   Diagnosis Date Noted  . Abnormality of gait 09/12/2012  . Anxiety 07/08/2010  . B12 deficiency 06/02/2010  . Multiple sclerosis 06/02/2010  . Carcinoma in situ of stomach 06/02/2010  . Neurogenic bladder 06/02/2010  . Osteopenia 06/02/2010   Past Medical History: Past Medical History  Diagnosis Date  . Cancer   . MS (multiple sclerosis)   . Osteopenia   . Vitamin B12 deficiency   . Thyroid disease     rt thyroid nodule  . IBS (irritable bowel syndrome)   . Neurogenic bladder     Past Surgical History: Past Surgical History  Procedure Laterality Date  . Partial gastrectomy      partial omentectomy    Social History: History   Social History  . Marital Status: Divorced    Spouse Name: N/A    Number of Children: 3   . Years of Education: N/A   Occupational History  . DISABLED    Social History Main Topics  . Smoking status: Never Smoker   . Smokeless tobacco: Never Used     Comment: never used tobacco  . Alcohol Use: No  . Drug Use: None  . Sexual Activity: None   Other Topics Concern  .  None   Social History Narrative   Patient is retired.    Patient is married with 3 children.   Patient drinks caffeine daily.     Family History: Family History  Problem Relation Age of Onset  . Heart disease Mother   . Diabetes Father     Allergies: No Known Allergies  Current Facility-Administered Medications  Medication Dose Route Frequency Provider Last Rate Last Dose  . 0.9 %  sodium chloride infusion   Intravenous Continuous Shanda Howells, MD      . heparin injection 5,000 Units  5,000 Units Subcutaneous 3 times per day Shanda Howells, MD      . ondansetron [facility]) tablet 4 mg  4 mg Oral  Q6H PRN Shanda Howells, MD       Or  . ondansetron [facility]) injection 4 mg  4 mg Intravenous Q6H PRN Shanda Howells, MD       Current Outpatient Prescriptions  Medication Sig Dispense Refill  . albuterol (PROVENTIL HFA;VENTOLIN HFA) 108 (90 BASE) MCG/ACT inhaler Inhale 2 puffs into the lungs every 6 (six) hours as needed for wheezing or shortness of breath.      . baclofen (LIORESAL) 10 MG tablet Take 1 tablet (10 mg total) by mouth 3 (three) times daily.  90 each  3  . diazepam (VALIUM) 5 MG tablet Take 5 mg by mouth as needed for anxiety.       . lansoprazole (PREVACID) 30 MG capsule Take 30 mg by mouth daily at 12 noon.      . Multiple Vitamin (MULTIVITAMIN) tablet Take 1 tablet by mouth daily.      . pregabalin (LYRICA) 50 MG capsule Take 50 mg by mouth 3 (three) times daily.       Review Of Systems: 12 point ROS negative except as noted above in HPI.  Physical Exam: Filed Vitals:   10/09/13 2000  BP: 150/94  Pulse: 55  Temp:   Resp: 17    General: alert and cooperative HEENT: PERRLA and extra ocular movement intact Heart: S1, S2 normal, no murmur, rub or gallop, regular rate and rhythm Lungs: clear to auscultation, no wheezes or rales and unlabored breathing Abdomen: abdomen is soft without significant tenderness, masses, organomegaly or guarding Extremities: 2-3/5 strength on R, 3/5 strength on L  Skin:no rashes Neurology: no significant focal neuro deficits on exam apart from bilateral weakness R>L  Labs and Imaging: Lab Results  Component Value Date/Time   NA 146 10/09/2013  1:10 PM   K 4.0 10/09/2013  1:10 PM   CL 109 10/09/2013  1:10 PM   CO2 26 10/09/2013  1:10 PM   BUN 13 10/09/2013  1:10 PM   CREATININE 1.02 10/09/2013  1:10 PM   CREATININE 1.00 04/11/2013 10:40 AM   GLUCOSE 99 10/09/2013  1:10 PM   Lab Results  Component Value Date   WBC 4.5 10/09/2013   HGB 11.5* 10/09/2013   HCT 34.9* 10/09/2013   MCV 90.4 10/09/2013   PLT 181 10/09/2013    Mr Brain W Wo  Contrast  10/09/2013   CLINICAL DATA:  63 year old female with current history of multiple sclerosis. Acute onset right side weakness. Initial encounter.  EXAM: MRI HEAD WITHOUT AND WITH CONTRAST  MRI CERVICAL SPINE WITHOUT AND WITH CONTRAST  TECHNIQUE: Multiplanar, multiecho pulse sequences of the brain and surrounding structures, and cervical spine, to include the craniocervical junction and cervicothoracic junction, were obtained without and with intravenous contrast.  CONTRAST:  39mL MULTIHANCE GADOBENATE DIMEGLUMINE  529 MG/ML IV SOLN  COMPARISON:  Brain MRI and cervical spine MRI 09/26/2012, and earlier.  FINDINGS: MRI HEAD FINDINGS  No restricted diffusion to suggest acute infarction. No midline shift, mass effect, evidence of mass lesion, ventriculomegaly, extra-axial collection or acute intracranial hemorrhage. Cervicomedullary junction and pituitary are within normal limits. Major intracranial vascular flow voids are stable.  Small scattered cerebral white matter T2 and FLAIR hyperintense lesions appear stable in size and number since 2014. Extent of disease is mild. No cortical encephalomalacia. Deep gray matter nuclei, brainstem and cerebellum appear spared. No abnormal enhancement identified.  Visible internal auditory structures appear normal. Mastoids are clear. Minor paranasal sinus mucosal thickening. Grossly normal orbits soft tissues. Visualized scalp soft tissues are within normal limits. Normal bone marrow signal.  MRI CERVICAL SPINE FINDINGS  Study is intermittently degraded by motion artifact despite repeated imaging attempts.  Chronic straightening and mild reversal of cervical lordosis. No marrow edema or evidence of acute osseous abnormality.  Chronic C5 ventral spinal cord T2 and STIR hyperintense lesion, on axial images appears to primarily effect central gray matter and ventral white matter ( series 20 in 1,600 image 13). No associated enhancement. No definite new or additional cervical  spinal cord lesion. No definite abnormal cervical spinal cord enhancement.  Better demonstrated in 2014 was a ventral T2-T3 thoracic cord lesion, which probably also is stable.  Chronic disc and endplate degeneration from C4-C5 to C6-C7. No associated spinal stenosis. There is associated moderate to severe biforaminal stenosis at these levels, sparing the right C6 neuroforamen, stable.  2 cm T2 hyperintense right thyroid nodule appears increased since 2011 (subcentimeter at that time. Otherwise negative paraspinal soft tissues.  IMPRESSION: 1. Stable mild chronic cerebral white matter signal changes. No acute intracranial abnormality. 2. Chronic C5-C6 spinal cord lesion appears stable since 2014. No new cervical cord lesion or evidence of active demyelination. 3. 2 cm right thyroid nodule has enlarged since 2011. Recommend further evaluation with thyroid ultrasound. If patient is clinically hyperthyroid, consider nuclear medicine thyroid uptake and scan. 4. Chronic cervical spine degenerative changes C4-C5 through C6-C7.   Electronically Signed   By: Lars Pinks M.D.   On: 10/09/2013 19:35   Mr Cervical Spine W Wo Contrast  10/09/2013   CLINICAL DATA:  63 year old female with current history of multiple sclerosis. Acute onset right side weakness. Initial encounter.  EXAM: MRI HEAD WITHOUT AND WITH CONTRAST  MRI CERVICAL SPINE WITHOUT AND WITH CONTRAST  TECHNIQUE: Multiplanar, multiecho pulse sequences of the brain and surrounding structures, and cervical spine, to include the craniocervical junction and cervicothoracic junction, were obtained without and with intravenous contrast.  CONTRAST:  63mL MULTIHANCE GADOBENATE DIMEGLUMINE 529 MG/ML IV SOLN  COMPARISON:  Brain MRI and cervical spine MRI 09/26/2012, and earlier.  FINDINGS: MRI HEAD FINDINGS  No restricted diffusion to suggest acute infarction. No midline shift, mass effect, evidence of mass lesion, ventriculomegaly, extra-axial collection or acute  intracranial hemorrhage. Cervicomedullary junction and pituitary are within normal limits. Major intracranial vascular flow voids are stable.  Small scattered cerebral white matter T2 and FLAIR hyperintense lesions appear stable in size and number since 2014. Extent of disease is mild. No cortical encephalomalacia. Deep gray matter nuclei, brainstem and cerebellum appear spared. No abnormal enhancement identified.  Visible internal auditory structures appear normal. Mastoids are clear. Minor paranasal sinus mucosal thickening. Grossly normal orbits soft tissues. Visualized scalp soft tissues are within normal limits. Normal bone marrow signal.  MRI CERVICAL SPINE FINDINGS  Study is  intermittently degraded by motion artifact despite repeated imaging attempts.  Chronic straightening and mild reversal of cervical lordosis. No marrow edema or evidence of acute osseous abnormality.  Chronic C5 ventral spinal cord T2 and STIR hyperintense lesion, on axial images appears to primarily effect central gray matter and ventral white matter ( series 20 in 1,600 image 13). No associated enhancement. No definite new or additional cervical spinal cord lesion. No definite abnormal cervical spinal cord enhancement.  Better demonstrated in 2014 was a ventral T2-T3 thoracic cord lesion, which probably also is stable.  Chronic disc and endplate degeneration from C4-C5 to C6-C7. No associated spinal stenosis. There is associated moderate to severe biforaminal stenosis at these levels, sparing the right C6 neuroforamen, stable.  2 cm T2 hyperintense right thyroid nodule appears increased since 2011 (subcentimeter at that time. Otherwise negative paraspinal soft tissues.  IMPRESSION: 1. Stable mild chronic cerebral white matter signal changes. No acute intracranial abnormality. 2. Chronic C5-C6 spinal cord lesion appears stable since 2014. No new cervical cord lesion or evidence of active demyelination. 3. 2 cm right thyroid nodule has  enlarged since 2011. Recommend further evaluation with thyroid ultrasound. If patient is clinically hyperthyroid, consider nuclear medicine thyroid uptake and scan. 4. Chronic cervical spine degenerative changes C4-C5 through C6-C7.   Electronically Signed   By: Lars Pinks M.D.   On: 10/09/2013 19:35           Shanda Howells MD  Pager: [phone]

## 2013-10-09 NOTE — ED Provider Notes (Signed)
CSN: [id number]     Arrival date & time 10/09/13  1159 History   First MD Initiated Contact with Patient 10/09/13 1244     Chief Complaint  Patient presents with  . reaction to medicine      (Consider location/radiation/quality/duration/timing/severity/associated sxs/prior Treatment) HPI MORNING HALBERG is a 63 y.o. female with history of MS, gastric cancer, vitamin B12 deficiency, fibromyalgia, who presents to emergency department complaining of right-sided weakness, pain, headache, right-sided neck pain. Patient states her symptoms began yesterday. States having to drag the right leg more than usual and weakness in right hand. Patient at baseline has some right-sided weakness, walks with a cane without difficulty. States 2 days ago was able to the mall and walk with no problems. Today patient is unable to raise right leg, and states "it drives him to walk." Patient denies history of the same. States her MS is under control, followed by [facility] neurology. Patient states the only changes she was just recently started on baclofen for her MS. Patient did not take any medications prior to coming. She denies any fever chills. Denies any head injury. She states her headache is mild, gradual onset, radiates down right neck. Patient states she was unsure of this is because of her MS or a medication reaction. Patient states she has had baclofen before with no problems.  Past Medical History  Diagnosis Date  . Cancer   . MS (multiple sclerosis)   . Osteopenia   . Vitamin B12 deficiency   . Thyroid disease     rt thyroid nodule  . IBS (irritable bowel syndrome)   . Neurogenic bladder    Past Surgical History  Procedure Laterality Date  . Partial gastrectomy      partial omentectomy   Family History  Problem Relation Age of Onset  . Heart disease Mother   . Diabetes Father    History  Substance Use Topics  . Smoking status: Never Smoker   . Smokeless tobacco: Never Used     Comment: never  used tobacco  . Alcohol Use: No   OB History   Grav Para Term Preterm Abortions TAB SAB Ect Mult Living                 Review of Systems  Constitutional: Negative for fever and chills.  Respiratory: Negative for cough, chest tightness and shortness of breath.   Cardiovascular: Negative for chest pain, palpitations and leg swelling.  Gastrointestinal: Negative for nausea, vomiting, abdominal pain and diarrhea.  Musculoskeletal: Positive for arthralgias, myalgias and neck pain. Negative for neck stiffness.  Skin: Negative for rash.  Neurological: Positive for weakness, numbness and headaches. Negative for dizziness.  All other systems reviewed and are negative.     Allergies  Review of patient's allergies indicates no known allergies.  Home Medications   Prior to Admission medications   Medication Sig Start Date End Date Taking? Authorizing Provider  albuterol (PROVENTIL HFA;VENTOLIN HFA) 108 (90 BASE) MCG/ACT inhaler Inhale 2 puffs into the lungs every 6 (six) hours as needed for wheezing or shortness of breath.   Yes Historical Provider, MD  baclofen (LIORESAL) 10 MG tablet Take 1 tablet (10 mg total) by mouth 3 (three) times daily. 10/08/13  Yes Marcial Pacas, MD  diazepam (VALIUM) 5 MG tablet Take 5 mg by mouth as needed for anxiety.  02/06/13  Yes Elby Showers, MD  lansoprazole (PREVACID) 30 MG capsule Take 30 mg by mouth daily at 12 noon.   Yes  Historical Provider, MD  Multiple Vitamin (MULTIVITAMIN) tablet Take 1 tablet by mouth daily.   Yes Historical Provider, MD  pregabalin (LYRICA) 50 MG capsule Take 50 mg by mouth 3 (three) times daily.   Yes Historical Provider, MD   BP 156/72  Pulse 63  Temp(Src) 97.8 F (36.6 C) (Oral)  Resp 14  Ht 5\' 6"  (1.676 m)  Wt 99 lb (44.906 kg)  BMI 15.99 kg/m2  SpO2 100% Physical Exam  Nursing note and vitals reviewed. Constitutional: She is oriented to person, place, and time. She appears well-developed and well-nourished. No distress.   HENT:  Head: Normocephalic.  Eyes: Conjunctivae and EOM are normal. Pupils are equal, round, and reactive to light.  Neck: Normal range of motion. Neck supple.  Cardiovascular: Normal rate, regular rhythm and normal heart sounds.   Pulmonary/Chest: Effort normal and breath sounds normal. No respiratory distress. She has no wheezes. She has no rales.  Abdominal: Soft. Bowel sounds are normal. She exhibits no distension. There is no tenderness. There is no rebound.  Musculoskeletal: She exhibits no edema.  Neurological: She is alert and oriented to person, place, and time. No cranial nerve deficit or sensory deficit. GCS eye subscore is 4. GCS verbal subscore is 5. GCS motor subscore is 6.  Mild right pronator drift. Unable to lift right leg off the stretcher. Unable to dorsiflex or plantar flex right foot. Normal strength in the left leg. Right grip strength is weaker the left.  Skin: Skin is warm and dry.  Psychiatric: She has a normal mood and affect. Her behavior is normal.    ED Course  Procedures (including critical care time) Labs Review Labs Reviewed  CBC WITH DIFFERENTIAL - Abnormal; Notable for the following:    RBC 3.86 (*)    Hemoglobin 11.5 (*)    HCT 34.9 (*)    Neutrophils Relative % 79 (*)    All other components within normal limits  COMPREHENSIVE METABOLIC PANEL  URINALYSIS, ROUTINE W REFLEX MICROSCOPIC  I-STAT TROPOININ, ED    Imaging Review No results found.   EKG Interpretation None      MDM   Final diagnoses:  None   Patient with history of mass, chronic right-sided weakness. At this time weakness and is increasingly worse, unable to raise right leg off the stretcher, unable to plantarflex or dorsiflex her right foot. Also some right upper extremity pronator drift and grip weakness. Patient has also had a gradual onset of right-sided headache and right neck pain. No meningismus placed in the exam. Headache onset is gradual, doubt intracranial  hemorrhage. Will get lab work, MRI of the brain and cervical spine to rule out CVA versus a vascular. Patient's vital signs are normal at this time. She staples.   Labs unremarkable, MRI still pending. Signed out at shift change. Disposition and neurologic consult after MRI of back.  Filed Vitals:   10/09/13 2219 10/10/13 0613 10/10/13 1354 10/10/13 2106  BP: 146/74 130/65 141/79 148/67  Pulse: 58 71 67 78  Temp: 98.7 F (37.1 C) 97.7 F (36.5 C) 97.3 F (36.3 C) 98.3 F (36.8 C)  TempSrc: Oral Oral Oral Oral  Resp: 16 16 16 16   Height:      Weight:      SpO2: 100% 100% 100% 98%      Renold Genta, PA-C 10/10/13 2225

## 2013-10-09 NOTE — ED Notes (Signed)
Patient states that she MS.  Patient states she started using baclofen and lyrica together yesterday.  Patient states since then she has had neck pain, shoulder pain, and trouble using extremities with tingling.   Patient states that she called pharmacy and her doctor who had told her it should be okay together, but less than 12 hours from taking the medications she was having trouble.  Patient states that she normally has problems with R side.   She was able to hold up L arm/leg with no drift.   R/arm with no drift.   Patient states she can't hold up R leg at baseline.   Patient denies slurred speed and no asymmetry in face.

## 2013-10-09 NOTE — ED Notes (Addendum)
Called MRI for an estimated time to do procedure, approx 30 mins. Updated pt.

## 2013-10-09 NOTE — ED Notes (Signed)
Patient transported to X-ray 

## 2013-10-09 NOTE — Telephone Encounter (Signed)
Patient called this morning and left me a voice message.  She is feeling very sick on her stomach and vomiting.  She started Baclofen yesterday and is wondering if taking Baclofen along with Lyrica is making her sick.  She has taken Baclofen before with no problems but never with the Baclofen.  I forwarded a message to Dr. Krista Blue asking her to call patient. Patient has expressed interest in the [employer] [id number]. She has an appointment to come in to [facility] in November to enroll.

## 2013-10-09 NOTE — ED Provider Notes (Signed)
Patient signed out to me by [place], PA-C at change of shift. Patient with MS, chronic right sided weakness with worsening weakness in right arm and leg today. She did recently begin Baclofen yesterday. Associated headache and right neck pain. MRI pending. Neuro consult after MRI. Dr. Krista Blue at [facility] is her neurologist.   MRI shows chronic pathology without new lesion.  7:57 PM Discussed case with Dr. Aram Beecham who recommends IV solumedrol and admission to medicine. He will consult.   Results for orders placed during the hospital encounter of 10/09/13  CBC WITH DIFFERENTIAL      Result Value Ref Range   WBC 4.5  4.0 - 10.5 K/uL   RBC 3.86 (*) 3.87 - 5.11 MIL/uL   Hemoglobin 11.5 (*) 12.0 - 15.0 g/dL   HCT 34.9 (*) 36.0 - 46.0 %   MCV 90.4  78.0 - 100.0 fL   MCH 29.8  26.0 - 34.0 pg   MCHC 33.0  30.0 - 36.0 g/dL   RDW 12.9  11.5 - 15.5 %   Platelets 181  150 - 400 K/uL   Neutrophils Relative % 79 (*) 43 - 77 %   Neutro Abs 3.6  1.7 - 7.7 K/uL   Lymphocytes Relative 15  12 - 46 %   Lymphs Abs 0.7  0.7 - 4.0 K/uL   Monocytes Relative 6  3 - 12 %   Monocytes Absolute 0.3  0.1 - 1.0 K/uL   Eosinophils Relative 0  0 - 5 %   Eosinophils Absolute 0.0  0.0 - 0.7 K/uL   Basophils Relative 0  0 - 1 %   Basophils Absolute 0.0  0.0 - 0.1 K/uL  COMPREHENSIVE METABOLIC PANEL      Result Value Ref Range   Sodium 146  137 - 147 mEq/L   Potassium 4.0  3.7 - 5.3 mEq/L   Chloride 109  96 - 112 mEq/L   CO2 26  19 - 32 mEq/L   Glucose, Bld 99  70 - 99 mg/dL   BUN 13  6 - 23 mg/dL   Creatinine, Ser 1.02  0.50 - 1.10 mg/dL   Calcium 9.2  8.4 - 10.5 mg/dL   Total Protein 7.3  6.0 - 8.3 g/dL   Albumin 3.5  3.5 - 5.2 g/dL   AST 25  0 - 37 U/L   ALT 15  0 - 35 U/L   Alkaline Phosphatase 94  39 - 117 U/L   Total Bilirubin 0.4  0.3 - 1.2 mg/dL   GFR calc non Af Amer 56 (*) >90 mL/min   GFR calc Af Amer 65 (*) >90 mL/min   Anion gap 11  5 - 15  URINALYSIS, ROUTINE W REFLEX MICROSCOPIC       Result Value Ref Range   Color, Urine YELLOW  YELLOW   APPearance CLEAR  CLEAR   Specific Gravity, Urine 1.023  1.005 - 1.030   pH 5.5  5.0 - 8.0   Glucose, UA NEGATIVE  NEGATIVE mg/dL   Hgb urine dipstick NEGATIVE  NEGATIVE   Bilirubin Urine NEGATIVE  NEGATIVE   Ketones, ur 15 (*) NEGATIVE mg/dL   Protein, ur NEGATIVE  NEGATIVE mg/dL   Urobilinogen, UA 1.0  0.0 - 1.0 mg/dL   Nitrite NEGATIVE  NEGATIVE   Leukocytes, UA SMALL (*) NEGATIVE  URINE MICROSCOPIC-ADD ON      Result Value Ref Range   Squamous Epithelial / LPF FEW (*) RARE   WBC, UA 3-6  <  3 WBC/hpf   RBC / HPF 0-2  <3 RBC/hpf   Bacteria, UA FEW (*) RARE   Urine-Other MUCOUS PRESENT    I-STAT TROPOININ, ED      Result Value Ref Range   Troponin i, poc 0.00  0.00 - 0.08 ng/mL   Comment 3            Mr Jeri Cos Wo Contrast  10/09/2013   CLINICAL DATA:  63 year old female with current history of multiple sclerosis. Acute onset right side weakness. Initial encounter.  EXAM: MRI HEAD WITHOUT AND WITH CONTRAST  MRI CERVICAL SPINE WITHOUT AND WITH CONTRAST  TECHNIQUE: Multiplanar, multiecho pulse sequences of the brain and surrounding structures, and cervical spine, to include the craniocervical junction and cervicothoracic junction, were obtained without and with intravenous contrast.  CONTRAST:  61mL MULTIHANCE GADOBENATE DIMEGLUMINE 529 MG/ML IV SOLN  COMPARISON:  Brain MRI and cervical spine MRI 09/26/2012, and earlier.  FINDINGS: MRI HEAD FINDINGS  No restricted diffusion to suggest acute infarction. No midline shift, mass effect, evidence of mass lesion, ventriculomegaly, extra-axial collection or acute intracranial hemorrhage. Cervicomedullary junction and pituitary are within normal limits. Major intracranial vascular flow voids are stable.  Small scattered cerebral white matter T2 and FLAIR hyperintense lesions appear stable in size and number since 2014. Extent of disease is mild. No cortical encephalomalacia. Deep gray matter  nuclei, brainstem and cerebellum appear spared. No abnormal enhancement identified.  Visible internal auditory structures appear normal. Mastoids are clear. Minor paranasal sinus mucosal thickening. Grossly normal orbits soft tissues. Visualized scalp soft tissues are within normal limits. Normal bone marrow signal.  MRI CERVICAL SPINE FINDINGS  Study is intermittently degraded by motion artifact despite repeated imaging attempts.  Chronic straightening and mild reversal of cervical lordosis. No marrow edema or evidence of acute osseous abnormality.  Chronic C5 ventral spinal cord T2 and STIR hyperintense lesion, on axial images appears to primarily effect central gray matter and ventral white matter ( series 20 in 1,600 image 13). No associated enhancement. No definite new or additional cervical spinal cord lesion. No definite abnormal cervical spinal cord enhancement.  Better demonstrated in 2014 was a ventral T2-T3 thoracic cord lesion, which probably also is stable.  Chronic disc and endplate degeneration from C4-C5 to C6-C7. No associated spinal stenosis. There is associated moderate to severe biforaminal stenosis at these levels, sparing the right C6 neuroforamen, stable.  2 cm T2 hyperintense right thyroid nodule appears increased since 2011 (subcentimeter at that time. Otherwise negative paraspinal soft tissues.  IMPRESSION: 1. Stable mild chronic cerebral white matter signal changes. No acute intracranial abnormality. 2. Chronic C5-C6 spinal cord lesion appears stable since 2014. No new cervical cord lesion or evidence of active demyelination. 3. 2 cm right thyroid nodule has enlarged since 2011. Recommend further evaluation with thyroid ultrasound. If patient is clinically hyperthyroid, consider nuclear medicine thyroid uptake and scan. 4. Chronic cervical spine degenerative changes C4-C5 through C6-C7.   Electronically Signed   By: Lars Pinks M.D.   On: 10/09/2013 19:35   Mr Cervical Spine W Wo  Contrast  10/09/2013   CLINICAL DATA:  63 year old female with current history of multiple sclerosis. Acute onset right side weakness. Initial encounter.  EXAM: MRI HEAD WITHOUT AND WITH CONTRAST  MRI CERVICAL SPINE WITHOUT AND WITH CONTRAST  TECHNIQUE: Multiplanar, multiecho pulse sequences of the brain and surrounding structures, and cervical spine, to include the craniocervical junction and cervicothoracic junction, were obtained without and with intravenous contrast.  CONTRAST:  36mL MULTIHANCE GADOBENATE DIMEGLUMINE 529 MG/ML IV SOLN  COMPARISON:  Brain MRI and cervical spine MRI 09/26/2012, and earlier.  FINDINGS: MRI HEAD FINDINGS  No restricted diffusion to suggest acute infarction. No midline shift, mass effect, evidence of mass lesion, ventriculomegaly, extra-axial collection or acute intracranial hemorrhage. Cervicomedullary junction and pituitary are within normal limits. Major intracranial vascular flow voids are stable.  Small scattered cerebral white matter T2 and FLAIR hyperintense lesions appear stable in size and number since 2014. Extent of disease is mild. No cortical encephalomalacia. Deep gray matter nuclei, brainstem and cerebellum appear spared. No abnormal enhancement identified.  Visible internal auditory structures appear normal. Mastoids are clear. Minor paranasal sinus mucosal thickening. Grossly normal orbits soft tissues. Visualized scalp soft tissues are within normal limits. Normal bone marrow signal.  MRI CERVICAL SPINE FINDINGS  Study is intermittently degraded by motion artifact despite repeated imaging attempts.  Chronic straightening and mild reversal of cervical lordosis. No marrow edema or evidence of acute osseous abnormality.  Chronic C5 ventral spinal cord T2 and STIR hyperintense lesion, on axial images appears to primarily effect central gray matter and ventral white matter ( series 20 in 1,600 image 13). No associated enhancement. No definite new or additional cervical  spinal cord lesion. No definite abnormal cervical spinal cord enhancement.  Better demonstrated in 2014 was a ventral T2-T3 thoracic cord lesion, which probably also is stable.  Chronic disc and endplate degeneration from C4-C5 to C6-C7. No associated spinal stenosis. There is associated moderate to severe biforaminal stenosis at these levels, sparing the right C6 neuroforamen, stable.  2 cm T2 hyperintense right thyroid nodule appears increased since 2011 (subcentimeter at that time. Otherwise negative paraspinal soft tissues.  IMPRESSION: 1. Stable mild chronic cerebral white matter signal changes. No acute intracranial abnormality. 2. Chronic C5-C6 spinal cord lesion appears stable since 2014. No new cervical cord lesion or evidence of active demyelination. 3. 2 cm right thyroid nodule has enlarged since 2011. Recommend further evaluation with thyroid ultrasound. If patient is clinically hyperthyroid, consider nuclear medicine thyroid uptake and scan. 4. Chronic cervical spine degenerative changes C4-C5 through C6-C7.   Electronically Signed   By: Lars Pinks M.D.   On: 10/09/2013 19:35     Elwyn Lade, PA-C 10/09/13 2008

## 2013-10-09 NOTE — Consult Note (Signed)
NEURO HOSPITALIST CONSULT NOTE    Reason for Consult: worsening right sided weakness, hands paresthesias  HPI:                                                                                                                                          Erika Murphy is an 63 y.o. female with a past medical history significant for MS off disease modifying therapy for several , gastric cancer s/p partial gastrectomy many years ago, vitamin B12 deficiency, comes in with the above mentioned complains. She states that she has chronic weakness of the right side but regardless of that she is able to ambulate well with a cane. In addition, she tells me that her last MS relapse was many years ago. In any case, patient said that since this morning she has been having increasing weakness of the leg to the point that walking is a real challenge. She also complain of new onset tingling in her fingertips. No recent fever or infection. No vertigo, double vision or painful visual loss, slurred speech, or bladder impairment. Stated that she was restarted on baclofen yesterday (she took it years ago) and she was under the impression that the increasing weakness was due to baclofen. MRI brain and cervical spine with and without contrast showed no acute abnormalities but chronic demyelinating lesions.   Past Medical History  Diagnosis Date  . Cancer   . MS (multiple sclerosis)   . Osteopenia   . Vitamin B12 deficiency   . Thyroid disease     rt thyroid nodule  . IBS (irritable bowel syndrome)   . Neurogenic bladder     Past Surgical History  Procedure Laterality Date  . Partial gastrectomy      partial omentectomy    Family History  Problem Relation Age of Onset  . Heart disease Mother   . Diabetes Father     Social History:  reports that she has never smoked. She has never used smokeless tobacco. She reports that she does not drink alcohol. Her drug history is not on file.  No  Known Allergies  MEDICATIONS:  Scheduled: . heparin  5,000 Units Subcutaneous 3 times per day  . [START ON 10/10/2013] methylPREDNISolone (SOLU-MEDROL) injection  1,000 mg Intravenous QHS  . multivitamin with minerals  1 tablet Oral Daily  . pantoprazole  40 mg Oral Daily  . pregabalin  50 mg Oral TID     ROS:                                                                                                                                       History obtained from the patient and chart review  General ROS: negative for - chills, fatigue, fever, night sweats, weight gain or weight loss Psychological ROS: negative for - behavioral disorder, hallucinations, memory difficulties, mood swings or suicidal ideation Ophthalmic ROS: negative for - blurry vision, double vision, eye pain or loss of vision ENT ROS: negative for - epistaxis, nasal discharge, oral lesions, sore throat, tinnitus or vertigo Allergy and Immunology ROS: negative for - hives or itchy/watery eyes Hematological and Lymphatic ROS: negative for - bleeding problems, bruising or swollen lymph nodes Endocrine ROS: negative for - galactorrhea, hair pattern changes, polydipsia/polyuria or temperature intolerance Respiratory ROS: negative for - cough, hemoptysis, shortness of breath or wheezing Cardiovascular ROS: negative for - chest pain, dyspnea on exertion, edema or irregular heartbeat Gastrointestinal ROS: negative for - abdominal pain, diarrhea, hematemesis, nausea/vomiting or stool incontinence Genito-Urinary ROS: negative for - dysuria, hematuria, incontinence or urinary frequency/urgency Musculoskeletal ROS: negative for - joint swelling Neurological ROS: as noted in HPI Dermatological ROS: negative for rash and skin lesion changes  Physical exam: pleasant female in no apparent distress. Blood pressure 146/74,  pulse 58, temperature 98.7 F (37.1 C), temperature source Oral, resp. rate 16, height $RemoveBe'5\' 5"'kRHjGNvnO$  (1.651 m), weight 48.671 kg (107 lb 4.8 oz), SpO2 100.00%. Head: normocephalic. Neck: supple, no bruits, no JVD. Cardiac: no murmurs. Lungs: clear. Abdomen: soft, no tender, no mass. Extremities: no edema. CV: pulses palpable throughout  Neurologic Examination:                                                                                                      General: Mental Status: Alert, oriented, thought content appropriate.  Speech fluent without evidence of aphasia.  Able to follow 3 step commands without difficulty. Cranial Nerves: II: Discs flat bilaterally; Visual fields grossly normal, pupils equal, round, reactive to light and accommodation III,IV, VI: ptosis not present, extra-ocular motions intact bilaterally V,VII: smile symmetric, facial light touch sensation normal bilaterally VIII: hearing  normal bilaterally IX,X: gag reflex present XI: bilateral shoulder shrug XII: midline tongue extension without atrophy or fasciculations Motor: Significant for right sided weakness leg greater than arm Tone normal Sensory: Pinprick and light touch intact throughout, bilaterally Deep Tendon Reflexes:  Right: Upper Extremity   Left: Upper extremity   biceps (C-5 to C-6) 2/4   biceps (C-5 to C-6) 2/4 tricep (C7) 2/4    triceps (C7) 2/4 Brachioradialis (C6) 2/4  Brachioradialis (C6) 2/4  Lower Extremity Lower Extremity  quadriceps (L-2 to L-4) 2/4   quadriceps (L-2 to L-4) 2/4 Achilles (S1) 2/4   Achilles (S1) 2/4  Plantars: Right: downgoing   Left: downgoing Cerebellar: normal finger-to-nose bilaterally,  normal heel-to-shin test in the left but couldn't perform in the right due to weakness Gait: No tested for safety reasons.    No results found for this basename: cbc, bmp, coags, chol, tri, ldl, hga1c    Results for orders placed during the hospital encounter of 10/09/13 (from  the past 48 hour(s))  CBC WITH DIFFERENTIAL     Status: Abnormal   Collection Time    10/09/13  1:10 PM      Result Value Ref Range   WBC 4.5  4.0 - 10.5 K/uL   RBC 3.86 (*) 3.87 - 5.11 MIL/uL   Hemoglobin 11.5 (*) 12.0 - 15.0 g/dL   HCT 34.9 (*) 36.0 - 46.0 %   MCV 90.4  78.0 - 100.0 fL   MCH 29.8  26.0 - 34.0 pg   MCHC 33.0  30.0 - 36.0 g/dL   RDW 12.9  11.5 - 15.5 %   Platelets 181  150 - 400 K/uL   Neutrophils Relative % 79 (*) 43 - 77 %   Neutro Abs 3.6  1.7 - 7.7 K/uL   Lymphocytes Relative 15  12 - 46 %   Lymphs Abs 0.7  0.7 - 4.0 K/uL   Monocytes Relative 6  3 - 12 %   Monocytes Absolute 0.3  0.1 - 1.0 K/uL   Eosinophils Relative 0  0 - 5 %   Eosinophils Absolute 0.0  0.0 - 0.7 K/uL   Basophils Relative 0  0 - 1 %   Basophils Absolute 0.0  0.0 - 0.1 K/uL  COMPREHENSIVE METABOLIC PANEL     Status: Abnormal   Collection Time    10/09/13  1:10 PM      Result Value Ref Range   Sodium 146  137 - 147 mEq/L   Potassium 4.0  3.7 - 5.3 mEq/L   Chloride 109  96 - 112 mEq/L   CO2 26  19 - 32 mEq/L   Glucose, Bld 99  70 - 99 mg/dL   BUN 13  6 - 23 mg/dL   Creatinine, Ser 1.02  0.50 - 1.10 mg/dL   Calcium 9.2  8.4 - 10.5 mg/dL   Total Protein 7.3  6.0 - 8.3 g/dL   Albumin 3.5  3.5 - 5.2 g/dL   AST 25  0 - 37 U/L   Comment: HEMOLYSIS AT THIS LEVEL MAY AFFECT RESULT   ALT 15  0 - 35 U/L   Alkaline Phosphatase 94  39 - 117 U/L   Total Bilirubin 0.4  0.3 - 1.2 mg/dL   GFR calc non Af Amer 56 (*) >90 mL/min   GFR calc Af Amer 65 (*) >90 mL/min   Comment: (NOTE)     The eGFR has been calculated using the CKD EPI equation.  This calculation has not been validated in all clinical situations.     eGFR's persistently <90 mL/min signify possible Chronic Kidney     Disease.   Anion gap 11  5 - 15  I-STAT TROPOININ, ED     Status: None   Collection Time    10/09/13  1:24 PM      Result Value Ref Range   Troponin i, poc 0.00  0.00 - 0.08 ng/mL   Comment 3            Comment:  Due to the release kinetics of cTnI,     a negative result within the first hours     of the onset of symptoms does not rule out     myocardial infarction with certainty.     If myocardial infarction is still suspected,     repeat the test at appropriate intervals.  URINALYSIS, ROUTINE W REFLEX MICROSCOPIC     Status: Abnormal   Collection Time    10/09/13  2:19 PM      Result Value Ref Range   Color, Urine YELLOW  YELLOW   APPearance CLEAR  CLEAR   Specific Gravity, Urine 1.023  1.005 - 1.030   pH 5.5  5.0 - 8.0   Glucose, UA NEGATIVE  NEGATIVE mg/dL   Hgb urine dipstick NEGATIVE  NEGATIVE   Bilirubin Urine NEGATIVE  NEGATIVE   Ketones, ur 15 (*) NEGATIVE mg/dL   Protein, ur NEGATIVE  NEGATIVE mg/dL   Urobilinogen, UA 1.0  0.0 - 1.0 mg/dL   Nitrite NEGATIVE  NEGATIVE   Leukocytes, UA SMALL (*) NEGATIVE  URINE MICROSCOPIC-ADD ON     Status: Abnormal   Collection Time    10/09/13  2:19 PM      Result Value Ref Range   Squamous Epithelial / LPF FEW (*) RARE   WBC, UA 3-6  <3 WBC/hpf   RBC / HPF 0-2  <3 RBC/hpf   Bacteria, UA FEW (*) RARE   Urine-Other MUCOUS PRESENT    CBC     Status: Abnormal   Collection Time    10/09/13  8:45 PM      Result Value Ref Range   WBC 3.8 (*) 4.0 - 10.5 K/uL   RBC 3.79 (*) 3.87 - 5.11 MIL/uL   Hemoglobin 11.1 (*) 12.0 - 15.0 g/dL   HCT 34.3 (*) 36.0 - 46.0 %   MCV 90.5  78.0 - 100.0 fL   MCH 29.3  26.0 - 34.0 pg   MCHC 32.4  30.0 - 36.0 g/dL   RDW 12.9  11.5 - 15.5 %   Platelets 168  150 - 400 K/uL  CREATININE, SERUM     Status: Abnormal   Collection Time    10/09/13  8:45 PM      Result Value Ref Range   Creatinine, Ser 1.02  0.50 - 1.10 mg/dL   GFR calc non Af Amer 56 (*) >90 mL/min   GFR calc Af Amer 65 (*) >90 mL/min   Comment: (NOTE)     The eGFR has been calculated using the CKD EPI equation.     This calculation has not been validated in all clinical situations.     eGFR's persistently <90 mL/min signify possible Chronic Kidney      Disease.  TSH     Status: Abnormal   Collection Time    10/09/13  8:49 PM      Result Value Ref Range   TSH 0.314 (*)  0.350 - 4.500 uIU/mL    Dg Chest 2 View  10/09/2013   CLINICAL DATA:  Progressive pain in weakness over the last 20 for hr. Left upper extremity tingling and has resolved. Personal history of an mass with progression of symptoms.  EXAM: CHEST  2 VIEW  COMPARISON:  Two-view chest 04/07/2013  FINDINGS: The heart size and mediastinal contours are within normal limits. Both lungs are clear. The visualized skeletal structures are unremarkable.  IMPRESSION: Negative two view chest.   Electronically Signed   By: Lawrence Santiago M.D.   On: 10/09/2013 21:38   Mr Jeri Cos KK Contrast  10/09/2013   CLINICAL DATA:  63 year old female with current history of multiple sclerosis. Acute onset right side weakness. Initial encounter.  EXAM: MRI HEAD WITHOUT AND WITH CONTRAST  MRI CERVICAL SPINE WITHOUT AND WITH CONTRAST  TECHNIQUE: Multiplanar, multiecho pulse sequences of the brain and surrounding structures, and cervical spine, to include the craniocervical junction and cervicothoracic junction, were obtained without and with intravenous contrast.  CONTRAST:  34mL MULTIHANCE GADOBENATE DIMEGLUMINE 529 MG/ML IV SOLN  COMPARISON:  Brain MRI and cervical spine MRI 09/26/2012, and earlier.  FINDINGS: MRI HEAD FINDINGS  No restricted diffusion to suggest acute infarction. No midline shift, mass effect, evidence of mass lesion, ventriculomegaly, extra-axial collection or acute intracranial hemorrhage. Cervicomedullary junction and pituitary are within normal limits. Major intracranial vascular flow voids are stable.  Small scattered cerebral white matter T2 and FLAIR hyperintense lesions appear stable in size and number since 2014. Extent of disease is mild. No cortical encephalomalacia. Deep gray matter nuclei, brainstem and cerebellum appear spared. No abnormal enhancement identified.  Visible internal  auditory structures appear normal. Mastoids are clear. Minor paranasal sinus mucosal thickening. Grossly normal orbits soft tissues. Visualized scalp soft tissues are within normal limits. Normal bone marrow signal.  MRI CERVICAL SPINE FINDINGS  Study is intermittently degraded by motion artifact despite repeated imaging attempts.  Chronic straightening and mild reversal of cervical lordosis. No marrow edema or evidence of acute osseous abnormality.  Chronic C5 ventral spinal cord T2 and STIR hyperintense lesion, on axial images appears to primarily effect central gray matter and ventral white matter ( series 20 in 1,600 image 13). No associated enhancement. No definite new or additional cervical spinal cord lesion. No definite abnormal cervical spinal cord enhancement.  Better demonstrated in 2014 was a ventral T2-T3 thoracic cord lesion, which probably also is stable.  Chronic disc and endplate degeneration from C4-C5 to C6-C7. No associated spinal stenosis. There is associated moderate to severe biforaminal stenosis at these levels, sparing the right C6 neuroforamen, stable.  2 cm T2 hyperintense right thyroid nodule appears increased since 2011 (subcentimeter at that time. Otherwise negative paraspinal soft tissues.  IMPRESSION: 1. Stable mild chronic cerebral white matter signal changes. No acute intracranial abnormality. 2. Chronic C5-C6 spinal cord lesion appears stable since 2014. No new cervical cord lesion or evidence of active demyelination. 3. 2 cm right thyroid nodule has enlarged since 2011. Recommend further evaluation with thyroid ultrasound. If patient is clinically hyperthyroid, consider nuclear medicine thyroid uptake and scan. 4. Chronic cervical spine degenerative changes C4-C5 through C6-C7.   Electronically Signed   By: Lars Pinks M.D.   On: 10/09/2013 19:35   Mr Cervical Spine W Wo Contrast  10/09/2013   CLINICAL DATA:  63 year old female with current history of multiple sclerosis. Acute  onset right side weakness. Initial encounter.  EXAM: MRI HEAD WITHOUT AND WITH CONTRAST  MRI CERVICAL  SPINE WITHOUT AND WITH CONTRAST  TECHNIQUE: Multiplanar, multiecho pulse sequences of the brain and surrounding structures, and cervical spine, to include the craniocervical junction and cervicothoracic junction, were obtained without and with intravenous contrast.  CONTRAST:  15mL MULTIHANCE GADOBENATE DIMEGLUMINE 529 MG/ML IV SOLN  COMPARISON:  Brain MRI and cervical spine MRI 09/26/2012, and earlier.  FINDINGS: MRI HEAD FINDINGS  No restricted diffusion to suggest acute infarction. No midline shift, mass effect, evidence of mass lesion, ventriculomegaly, extra-axial collection or acute intracranial hemorrhage. Cervicomedullary junction and pituitary are within normal limits. Major intracranial vascular flow voids are stable.  Small scattered cerebral white matter T2 and FLAIR hyperintense lesions appear stable in size and number since 2014. Extent of disease is mild. No cortical encephalomalacia. Deep gray matter nuclei, brainstem and cerebellum appear spared. No abnormal enhancement identified.  Visible internal auditory structures appear normal. Mastoids are clear. Minor paranasal sinus mucosal thickening. Grossly normal orbits soft tissues. Visualized scalp soft tissues are within normal limits. Normal bone marrow signal.  MRI CERVICAL SPINE FINDINGS  Study is intermittently degraded by motion artifact despite repeated imaging attempts.  Chronic straightening and mild reversal of cervical lordosis. No marrow edema or evidence of acute osseous abnormality.  Chronic C5 ventral spinal cord T2 and STIR hyperintense lesion, on axial images appears to primarily effect central gray matter and ventral white matter ( series 20 in 1,600 image 13). No associated enhancement. No definite new or additional cervical spinal cord lesion. No definite abnormal cervical spinal cord enhancement.  Better demonstrated in 2014 was a  ventral T2-T3 thoracic cord lesion, which probably also is stable.  Chronic disc and endplate degeneration from C4-C5 to C6-C7. No associated spinal stenosis. There is associated moderate to severe biforaminal stenosis at these levels, sparing the right C6 neuroforamen, stable.  2 cm T2 hyperintense right thyroid nodule appears increased since 2011 (subcentimeter at that time. Otherwise negative paraspinal soft tissues.  IMPRESSION: 1. Stable mild chronic cerebral white matter signal changes. No acute intracranial abnormality. 2. Chronic C5-C6 spinal cord lesion appears stable since 2014. No new cervical cord lesion or evidence of active demyelination. 3. 2 cm right thyroid nodule has enlarged since 2011. Recommend further evaluation with thyroid ultrasound. If patient is clinically hyperthyroid, consider nuclear medicine thyroid uptake and scan. 4. Chronic cervical spine degenerative changes C4-C5 through C6-C7.   Electronically Signed   By: Lars Pinks M.D.   On: 10/09/2013 19:35   Assessment/Plan:  63 y/o with history of MS with chronic right sided weakness admitted with worsening weakness right leg greater than arm and paresthesias fingertips bilaterally. Last MS relapse many years ago. Patient has been off DMT for several years. MRI brain and cervical spine with and without contrast showed no new demyelinating lesions. MS relapse versus progressive disease.  Recommend: IV solumedrol 1 gram daily x 3 days. PT. Will follow up.   Dorian Pod, MD 10/09/2013, 11:02 PM [facility]

## 2013-10-09 NOTE — ED Notes (Signed)
Hospitalist at bedside 

## 2013-10-09 NOTE — ED Provider Notes (Signed)
Medical screening examination/treatment/procedure(s) were performed by non-physician practitioner and as supervising physician I was immediately available for consultation/collaboration.   EKG Interpretation None        Ezequiel Essex, MD 10/09/13 [id number]

## 2013-10-09 NOTE — ED Notes (Signed)
Patient transported to MRI 

## 2013-10-10 ENCOUNTER — Telehealth: Payer: Self-pay | Admitting: Neurology

## 2013-10-10 ENCOUNTER — Encounter: Payer: Self-pay | Admitting: Neurology

## 2013-10-10 DIAGNOSIS — R1111 Vomiting without nausea: Secondary | ICD-10-CM | POA: Diagnosis not present

## 2013-10-10 DIAGNOSIS — R1110 Vomiting, unspecified: Secondary | ICD-10-CM | POA: Diagnosis present

## 2013-10-10 DIAGNOSIS — R269 Unspecified abnormalities of gait and mobility: Secondary | ICD-10-CM | POA: Diagnosis not present

## 2013-10-10 DIAGNOSIS — E041 Nontoxic single thyroid nodule: Secondary | ICD-10-CM | POA: Diagnosis present

## 2013-10-10 DIAGNOSIS — G35 Multiple sclerosis: Secondary | ICD-10-CM | POA: Diagnosis present

## 2013-10-10 DIAGNOSIS — F419 Anxiety disorder, unspecified: Secondary | ICD-10-CM

## 2013-10-10 DIAGNOSIS — Z903 Acquired absence of stomach [part of]: Secondary | ICD-10-CM | POA: Diagnosis present

## 2013-10-10 DIAGNOSIS — Z79899 Other long term (current) drug therapy: Secondary | ICD-10-CM | POA: Diagnosis not present

## 2013-10-10 DIAGNOSIS — Z85028 Personal history of other malignant neoplasm of stomach: Secondary | ICD-10-CM | POA: Diagnosis not present

## 2013-10-10 DIAGNOSIS — G8929 Other chronic pain: Secondary | ICD-10-CM | POA: Diagnosis present

## 2013-10-10 DIAGNOSIS — N319 Neuromuscular dysfunction of bladder, unspecified: Secondary | ICD-10-CM | POA: Diagnosis present

## 2013-10-10 DIAGNOSIS — R112 Nausea with vomiting, unspecified: Secondary | ICD-10-CM | POA: Diagnosis not present

## 2013-10-10 LAB — COMPREHENSIVE METABOLIC PANEL
ALT: 14 U/L (ref 0–35)
ANION GAP: 12 (ref 5–15)
AST: 21 U/L (ref 0–37)
Albumin: 3.2 g/dL — ABNORMAL LOW (ref 3.5–5.2)
Alkaline Phosphatase: 92 U/L (ref 39–117)
BILIRUBIN TOTAL: 0.6 mg/dL (ref 0.3–1.2)
BUN: 14 mg/dL (ref 6–23)
CO2: 23 mEq/L (ref 19–32)
Calcium: 9.1 mg/dL (ref 8.4–10.5)
Chloride: 109 mEq/L (ref 96–112)
Creatinine, Ser: 0.94 mg/dL (ref 0.50–1.10)
GFR calc Af Amer: 72 mL/min — ABNORMAL LOW (ref >90)
GFR calc non Af Amer: 62 mL/min — ABNORMAL LOW (ref >90)
Glucose, Bld: 172 mg/dL — ABNORMAL HIGH (ref 70–99)
Potassium: 4.3 mEq/L (ref 3.7–5.3)
SODIUM: 144 meq/L (ref 137–147)
TOTAL PROTEIN: 6.8 g/dL (ref 6.0–8.3)

## 2013-10-10 LAB — CBC WITH DIFFERENTIAL/PLATELET
BASOS ABS: 0 10*3/uL (ref 0.0–0.1)
BASOS PCT: 0 % (ref 0–1)
EOS PCT: 0 % (ref 0–5)
Eosinophils Absolute: 0 10*3/uL (ref 0.0–0.7)
HEMATOCRIT: 35.2 % — AB (ref 36.0–46.0)
HEMOGLOBIN: 11.5 g/dL — AB (ref 12.0–15.0)
Lymphocytes Relative: 20 % (ref 12–46)
Lymphs Abs: 0.4 10*3/uL — ABNORMAL LOW (ref 0.7–4.0)
MCH: 29.8 pg (ref 26.0–34.0)
MCHC: 32.7 g/dL (ref 30.0–36.0)
MCV: 91.2 fL (ref 78.0–100.0)
MONO ABS: 0 10*3/uL — AB (ref 0.1–1.0)
MONOS PCT: 1 % — AB (ref 3–12)
NEUTROS ABS: 1.7 10*3/uL (ref 1.7–7.7)
Neutrophils Relative %: 79 % — ABNORMAL HIGH (ref 43–77)
Platelets: 174 10*3/uL (ref 150–400)
RBC: 3.86 MIL/uL — ABNORMAL LOW (ref 3.87–5.11)
RDW: 12.8 % (ref 11.5–15.5)
WBC: 2.1 10*3/uL — ABNORMAL LOW (ref 4.0–10.5)

## 2013-10-10 MED ORDER — BACLOFEN 10 MG PO TABS
10.0000 mg | ORAL_TABLET | Freq: Three times a day (TID) | ORAL | Status: DC | PRN
  Filled 2013-10-10: qty 1

## 2013-10-10 NOTE — Progress Notes (Signed)
Pt arrived to unit alert and oriented x4. Oriented to room, unit, and staff.  Bed in lowest position and call bell is within reach. Will continue to monitor. 

## 2013-10-10 NOTE — Telephone Encounter (Signed)
Message copied by Marcial Pacas on Fri Oct 10, 2013  4:22 PM ------      Message from: [place], [place] M      Created: Thu Oct 09, 2013  8:35 AM      Regarding: Erika Murphy      Contact: [phone]       Patient called me this morning and she took her first dose of Baclofen yesterday with her Lyrica and she said she is feeling very sick on her stomach.  She vomited this morning. The first time it was brown and the second time it was green.  She says she has taken the Baclofen before with no problems but has never taken it with Lyrica before until now.  She wants to know if this could be the cause of her feeling so sick today. Can you please give her a call to discuss?  Thank you.  ------

## 2013-10-10 NOTE — Progress Notes (Signed)
Subjective: patient feels her right arm and leg are improving in strength.  Still feels she has some pain on the right elbow.   Objective: Current vital signs: BP 130/65  Pulse 71  Temp(Src) 97.7 F (36.5 C) (Oral)  Resp 16  Ht 5\' 5"  (1.651 m)  Wt 48.671 kg (107 lb 4.8 oz)  BMI 17.86 kg/m2  SpO2 100% Vital signs in last 24 hours: Temp:  [97.7 F (36.5 C)-98.7 F (37.1 C)] 97.7 F (36.5 C) (10/09 2993) Pulse Rate:  [52-122] 71 (10/09 0613) Resp:  [11-26] 16 (10/09 0613) BP: (106-160)/(44-94) 130/65 mmHg (10/09 0613) SpO2:  [92 %-100 %] 100 % (10/09 0613) Weight:  [44.906 kg (99 lb)-48.671 kg (107 lb 4.8 oz)] 48.671 kg (107 lb 4.8 oz) (10/08 2152)  Intake/Output from previous day:   Intake/Output this shift: Total I/O In: 240 [P.O.:240] Out: -  Nutritional status: Cardiac  Neurologic Exam: General: NAD Mental Status: Alert, oriented, thought content appropriate.  Speech fluent without evidence of aphasia.  Able to follow 3 step commands without difficulty. Cranial Nerves: II: Discs flat bilaterally; Visual fields grossly normal, pupils equal, round, reactive to light and accommodation III,IV, VI: ptosis not present, extra-ocular motions intact bilaterally V,VII: smile symmetric, facial light touch sensation normal bilaterally VIII: hearing normal bilaterally IX,X: gag reflex present XI: bilateral shoulder shrug XII: midline tongue extension without atrophy or fasciculations  Motor: Right : Upper extremity   4/5    Left:     Upper extremity   5/5  Lower extremity   3/5 (Distal>prox)   Lower extremity   5/5 Tone and bulk:normal tone throughout; no atrophy noted Sensory: Pinprick and light touch intact throughout, bilaterally Deep Tendon Reflexes:  Right: Upper Extremity   Left: Upper extremity   biceps (C-5 to C-6) 2/4   biceps (C-5 to C-6) 2/4 tricep (C7) 2/4    triceps (C7) 2/4 Brachioradialis (C6) 2/4  Brachioradialis (C6) 2/4  Lower Extremity Lower Extremity   quadriceps (L-2 to L-4) 2/4   quadriceps (L-2 to L-4) 2/4 Achilles (S1) 2/4   Achilles (S1) 2/4  Plantars: Right: downgoing   Left: downgoing Cerebellar: normal finger-to-nose,  normal heel-to-shin test    Lab Results: Basic Metabolic Panel:  Recent Labs Lab 10/09/13 1310 10/09/13 2045 10/10/13 0540  NA 146  --  144  K 4.0  --  4.3  CL 109  --  109  CO2 26  --  23  GLUCOSE 99  --  172*  BUN 13  --  14  CREATININE 1.02 1.02 0.94  CALCIUM 9.2  --  9.1    Liver Function Tests:  Recent Labs Lab 10/09/13 1310 10/10/13 0540  AST 25 21  ALT 15 14  ALKPHOS 94 92  BILITOT 0.4 0.6  PROT 7.3 6.8  ALBUMIN 3.5 3.2*   No results found for this basename: LIPASE, AMYLASE,  in the last 168 hours No results found for this basename: AMMONIA,  in the last 168 hours  CBC:  Recent Labs Lab 10/09/13 1310 10/09/13 2045 10/10/13 0540  WBC 4.5 3.8* 2.1*  NEUTROABS 3.6  --  1.7  HGB 11.5* 11.1* 11.5*  HCT 34.9* 34.3* 35.2*  MCV 90.4 90.5 91.2  PLT 181 168 174    Cardiac Enzymes: No results found for this basename: CKTOTAL, CKMB, CKMBINDEX, TROPONINI,  in the last 168 hours  Lipid Panel: No results found for this basename: CHOL, TRIG, HDL, CHOLHDL, VLDL, LDLCALC,  in the last 168 hours  CBG: No results found for this basename: GLUCAP,  in the last 168 hours  Microbiology: Results for orders placed in visit on 09/19/10  [facility] - SMEAR     Status: None   Collection Time    09/19/10 10:20 AM      Result Value Ref Range Status   Smear Result Smear Available   Final    Coagulation Studies: No results found for this basename: LABPROT, INR,  in the last 72 hours  Imaging: Dg Chest 2 View  10/09/2013   CLINICAL DATA:  Progressive pain in weakness over the last 20 for hr. Left upper extremity tingling and has resolved. Personal history of an mass with progression of symptoms.  EXAM: CHEST  2 VIEW  COMPARISON:  Two-view chest 04/07/2013  FINDINGS: The heart size  and mediastinal contours are within normal limits. Both lungs are clear. The visualized skeletal structures are unremarkable.  IMPRESSION: Negative two view chest.   Electronically Signed   By: Lawrence Santiago M.D.   On: 10/09/2013 21:38   Mr Jeri Cos RJ Contrast  10/09/2013   CLINICAL DATA:  63 year old female with current history of multiple sclerosis. Acute onset right side weakness. Initial encounter.  EXAM: MRI HEAD WITHOUT AND WITH CONTRAST  MRI CERVICAL SPINE WITHOUT AND WITH CONTRAST  TECHNIQUE: Multiplanar, multiecho pulse sequences of the brain and surrounding structures, and cervical spine, to include the craniocervical junction and cervicothoracic junction, were obtained without and with intravenous contrast.  CONTRAST:  50mL MULTIHANCE GADOBENATE DIMEGLUMINE 529 MG/ML IV SOLN  COMPARISON:  Brain MRI and cervical spine MRI 09/26/2012, and earlier.  FINDINGS: MRI HEAD FINDINGS  No restricted diffusion to suggest acute infarction. No midline shift, mass effect, evidence of mass lesion, ventriculomegaly, extra-axial collection or acute intracranial hemorrhage. Cervicomedullary junction and pituitary are within normal limits. Major intracranial vascular flow voids are stable.  Small scattered cerebral white matter T2 and FLAIR hyperintense lesions appear stable in size and number since 2014. Extent of disease is mild. No cortical encephalomalacia. Deep gray matter nuclei, brainstem and cerebellum appear spared. No abnormal enhancement identified.  Visible internal auditory structures appear normal. Mastoids are clear. Minor paranasal sinus mucosal thickening. Grossly normal orbits soft tissues. Visualized scalp soft tissues are within normal limits. Normal bone marrow signal.  MRI CERVICAL SPINE FINDINGS  Study is intermittently degraded by motion artifact despite repeated imaging attempts.  Chronic straightening and mild reversal of cervical lordosis. No marrow edema or evidence of acute osseous  abnormality.  Chronic C5 ventral spinal cord T2 and STIR hyperintense lesion, on axial images appears to primarily effect central gray matter and ventral white matter ( series 20 in 1,600 image 13). No associated enhancement. No definite new or additional cervical spinal cord lesion. No definite abnormal cervical spinal cord enhancement.  Better demonstrated in 2014 was a ventral T2-T3 thoracic cord lesion, which probably also is stable.  Chronic disc and endplate degeneration from C4-C5 to C6-C7. No associated spinal stenosis. There is associated moderate to severe biforaminal stenosis at these levels, sparing the right C6 neuroforamen, stable.  2 cm T2 hyperintense right thyroid nodule appears increased since 2011 (subcentimeter at that time. Otherwise negative paraspinal soft tissues.  IMPRESSION: 1. Stable mild chronic cerebral white matter signal changes. No acute intracranial abnormality. 2. Chronic C5-C6 spinal cord lesion appears stable since 2014. No new cervical cord lesion or evidence of active demyelination. 3. 2 cm right thyroid nodule has enlarged since 2011. Recommend further evaluation with thyroid ultrasound.  If patient is clinically hyperthyroid, consider nuclear medicine thyroid uptake and scan. 4. Chronic cervical spine degenerative changes C4-C5 through C6-C7.   Electronically Signed   By: Lars Pinks M.D.   On: 10/09/2013 19:35   Mr Cervical Spine W Wo Contrast  10/09/2013   CLINICAL DATA:  63 year old female with current history of multiple sclerosis. Acute onset right side weakness. Initial encounter.  EXAM: MRI HEAD WITHOUT AND WITH CONTRAST  MRI CERVICAL SPINE WITHOUT AND WITH CONTRAST  TECHNIQUE: Multiplanar, multiecho pulse sequences of the brain and surrounding structures, and cervical spine, to include the craniocervical junction and cervicothoracic junction, were obtained without and with intravenous contrast.  CONTRAST:  60mL MULTIHANCE GADOBENATE DIMEGLUMINE 529 MG/ML IV SOLN   COMPARISON:  Brain MRI and cervical spine MRI 09/26/2012, and earlier.  FINDINGS: MRI HEAD FINDINGS  No restricted diffusion to suggest acute infarction. No midline shift, mass effect, evidence of mass lesion, ventriculomegaly, extra-axial collection or acute intracranial hemorrhage. Cervicomedullary junction and pituitary are within normal limits. Major intracranial vascular flow voids are stable.  Small scattered cerebral white matter T2 and FLAIR hyperintense lesions appear stable in size and number since 2014. Extent of disease is mild. No cortical encephalomalacia. Deep gray matter nuclei, brainstem and cerebellum appear spared. No abnormal enhancement identified.  Visible internal auditory structures appear normal. Mastoids are clear. Minor paranasal sinus mucosal thickening. Grossly normal orbits soft tissues. Visualized scalp soft tissues are within normal limits. Normal bone marrow signal.  MRI CERVICAL SPINE FINDINGS  Study is intermittently degraded by motion artifact despite repeated imaging attempts.  Chronic straightening and mild reversal of cervical lordosis. No marrow edema or evidence of acute osseous abnormality.  Chronic C5 ventral spinal cord T2 and STIR hyperintense lesion, on axial images appears to primarily effect central gray matter and ventral white matter ( series 20 in 1,600 image 13). No associated enhancement. No definite new or additional cervical spinal cord lesion. No definite abnormal cervical spinal cord enhancement.  Better demonstrated in 2014 was a ventral T2-T3 thoracic cord lesion, which probably also is stable.  Chronic disc and endplate degeneration from C4-C5 to C6-C7. No associated spinal stenosis. There is associated moderate to severe biforaminal stenosis at these levels, sparing the right C6 neuroforamen, stable.  2 cm T2 hyperintense right thyroid nodule appears increased since 2011 (subcentimeter at that time. Otherwise negative paraspinal soft tissues.  IMPRESSION:  1. Stable mild chronic cerebral white matter signal changes. No acute intracranial abnormality. 2. Chronic C5-C6 spinal cord lesion appears stable since 2014. No new cervical cord lesion or evidence of active demyelination. 3. 2 cm right thyroid nodule has enlarged since 2011. Recommend further evaluation with thyroid ultrasound. If patient is clinically hyperthyroid, consider nuclear medicine thyroid uptake and scan. 4. Chronic cervical spine degenerative changes C4-C5 through C6-C7.   Electronically Signed   By: Lars Pinks M.D.   On: 10/09/2013 19:35    Medications:  Scheduled: . heparin  5,000 Units Subcutaneous 3 times per day  . methylPREDNISolone (SOLU-MEDROL) injection  1,000 mg Intravenous QHS  . multivitamin with minerals  1 tablet Oral Daily  . pantoprazole  40 mg Oral Daily  . pregabalin  50 mg Oral TID    Assessment/Plan:  63 y/o with history of MS with chronic right sided weakness presenting with right leg>arm weakness and paresthesias of bilateral finger tips.  She has received one dose of solumedrol with significant improvement in right arm strength and resolution of bilateral finger paresthesia. Patient has been  off DMT for several years. MRI brain and cervical spine with and without contrast showed no new demyelinating lesions. MS relapse versus progressive disease   Recommend:  IV solumedrol 1 gram daily for 2 more days.  PT.      Etta Quill PA-C [facility] [phone]  10/10/2013, 11:36 AM

## 2013-10-10 NOTE — Evaluation (Signed)
Physical Therapy Evaluation Patient Details Name: Erika Murphy MRN: [id number] DOB: 1950-11-19 Today's Date: 10/10/2013   History of Present Illness  Patient is a 63 yo female admitted 10/09/13 with increased weakness Rt LE and emesis.  Patient with MS exacerbation.  PMH:  Gastric CA, MS (for approx 20 years).  Clinical Impression  Patient presents with decreased strength primarily RLE impacting functional mobility due to MS exacerbation.  Will benefit from acute PT to maximize independence prior to return home with family support.  Recommend HHPT follow-up at discharge.    Follow Up Recommendations Home health PT;Supervision/Assistance - 24 hour    Equipment Recommendations  None recommended by PT    Recommendations for Other Services       Precautions / Restrictions Precautions Precautions: Fall Restrictions Weight Bearing Restrictions: No      Mobility  Bed Mobility Overal bed mobility: Needs Assistance Bed Mobility: Supine to Sit;Sit to Supine     Supine to sit: Min guard Sit to supine: Min assist   General bed mobility comments: Verbal cues for technique.  Patient able to use rail and move to sitting with min guard assist for safety.  Good sitting balance.  Patient required min assist to bring RLE onto bed to return to supine.  Transfers Overall transfer level: Needs assistance Equipment used: 1 person hand held assist Transfers: Sit to/from Stand Sit to Stand: Min assist         General transfer comment: Verbal cues for hand placement and safety.  Moved to standing with weight shifted over LLE.  Able to stand x 3 minutes, working on weightbearing on RLE with support for knee.  Knee buckling with attempts to step with LLE.  Ambulation/Gait                Stairs            Wheelchair Mobility    Modified Rankin (Stroke Patients Only)       Balance Overall balance assessment: Needs assistance Sitting-balance support: No upper extremity  supported;Feet supported Sitting balance-Leahy Scale: Good     Standing balance support: Single extremity supported Standing balance-Leahy Scale: Poor Standing balance comment: Required UE support to maintain balance                             Pertinent Vitals/Pain Pain Assessment: 0-10 Pain Score: 3  Pain Location: head Pain Descriptors / Indicators: Aching Pain Intervention(s): Monitored during session    Home Living Family/patient expects to be discharged to:: Private residence Living Arrangements: Alone Available Help at Discharge: Family;Available 24 hours/day (Cousin staying with patient now; son, daughter-in-law prn) Type of Home: House Home Access: Stairs to enter Entrance Stairs-Rails: None Entrance Stairs-Number of Steps: 5 Home Layout: One level Home Equipment: Cane - single point;Bedside commode;Shower seat;Grab bars - tub/shower;Hand held Tourist information centre manager - 2 wheels      Prior Function Level of Independence: Independent with assistive device(s)         Comments: Uses cane for ambulation     Hand Dominance        Extremity/Trunk Assessment   Upper Extremity Assessment: Generalized weakness;RUE deficits/detail RUE Deficits / Details: Decreased sensation   RUE Sensation: decreased light touch     Lower Extremity Assessment: Generalized weakness;RLE deficits/detail RLE Deficits / Details: Decreased strength and sensation. Strength grossly 2/5 at hip/knee. 1/5 DF/PF.  Increased tone    Cervical / Trunk Assessment: Normal  Communication  Communication: No difficulties  Cognition Arousal/Alertness: Awake/alert Behavior During Therapy: WFL for tasks assessed/performed Overall Cognitive Status: Within Functional Limits for tasks assessed                      General Comments      Exercises        Assessment/Plan    PT Assessment Patient needs continued PT services  PT Diagnosis Difficulty walking;Generalized  weakness;Acute pain;Hemiplegia dominant side   PT Problem List Decreased strength;Decreased activity tolerance;Decreased balance;Decreased mobility;Decreased coordination;Decreased knowledge of use of DME;Impaired sensation;Impaired tone;Pain  PT Treatment Interventions DME instruction;Gait training;Functional mobility training;Therapeutic activities;Therapeutic exercise;Balance training;Patient/family education   PT Goals (Current goals can be found in the Care Plan section) Acute Rehab PT Goals Patient Stated Goal: To get stronger PT Goal Formulation: With patient Time For Goal Achievement: 10/17/13 Potential to Achieve Goals: Good    Frequency Min 3X/week   Barriers to discharge Decreased caregiver support Patient lives alone.  Reports cousin is visiting, staying with patient to help out.    Co-evaluation               End of Session Equipment Utilized During Treatment: Gait belt Activity Tolerance: Patient limited by fatigue;Patient limited by pain Patient left: in bed;with call bell/phone within reach;with bed alarm set;with family/visitor present Nurse Communication: Mobility status    Functional Assessment Tool Used: Clinical judgement Functional Limitation: Mobility: Walking and moving around Mobility: Walking and Moving Around Current Status ([id number]): At least 40 percent but less than 60 percent impaired, limited or restricted Mobility: Walking and Moving Around Goal Status [phone]): At least 1 percent but less than 20 percent impaired, limited or restricted    Time: 1004-1045 PT Time Calculation (min): 41 min   Charges:   PT Evaluation $Initial PT Evaluation Tier I: 1 Procedure PT Treatments $Therapeutic Activity: 23-37 mins   PT G Codes:   Functional Assessment Tool Used: Clinical judgement Functional Limitation: Mobility: Walking and moving around    Despina Pole 10/10/2013, 11:03 AM Carita Pian. Sanjuana Kava, [place] Pager [phone]

## 2013-10-10 NOTE — Telephone Encounter (Signed)
I have called her,now she is taking baclofen 10mg  tid, Lyrica 50mg  tid, she complains of nause, vomitting, worsening legs weakness, gait difficulty,  She also complains of arms numbness tingly. This happened Oct 8th 2015, now admitted to hospital in oct 9th,  Repeat MRI of the brain and cervical spine  Stable mild chronic cerebral white matter signal changes. No  acute intracranial abnormality.  2. Chronic C5-C6 spinal cord lesion appears stable since 2014. No  new cervical cord lesion or evidence of active demyelination.  3. 2 cm right thyroid nodule has enlarged since 2011. Recommend  further evaluation with thyroid ultrasound. If patient is clinically  hyperthyroid, consider nuclear medicine thyroid uptake and scan.  4. Chronic cervical spine degenerative changes C4-C5 through C6-C7.  Now receiving  solumedrol at hospital,Her symptoms has much improved, I have advised her to follow inpatient instruction, starting regular dose of baclofen 10 mg 3 times a day after she is discharged, space out couple hours from her Lyrica 50 mg 3 times a day

## 2013-10-10 NOTE — Progress Notes (Signed)
[facility] PROGRESS NOTE  Erika Murphy VWU:[id number] DOB: [birth year] DOA: 10/09/2013 PCP: Elby Showers, MD  Assessment/Plan:   1. MS with chronic right sided weakness admitted with worsening weakness right leg greater than arm and paresthesias fingertips bilaterally. Last MS relapse many years ago. Patient has been off DMT for several years. MRI brain and cervical spine with and without contrast showed no new demyelinating lesions.  MS relapse versus progressive disease.  -continue IV solumedrol 1 gram daily x 3 days per Neuro recs -continue Lyrica and baclofen -Physical therapy  2. Emesis -transient resolved  3. Low TSH -check T4  DVt proph: Hep Sq  Code Status: Full Code Family Communication: none at bedside Disposition Plan: home in 2days   Consultants:  Neurology  HPI/Subjective: Slightly improved RLE strength  Objective: Filed Vitals:   10/10/13 0613  BP: 130/65  Pulse: 71  Temp: 97.7 F (36.5 C)  Resp: 16    Intake/Output Summary (Last 24 hours) at 10/10/13 1308 Last data filed at 10/10/13 1002  Gross per 24 hour  Intake    240 ml  Output      0 ml  Net    240 ml   Filed Weights   10/09/13 1222 10/09/13 2152  Weight: 44.906 kg (99 lb) 48.671 kg (107 lb 4.8 oz)    Exam:   General:  AAOx3  Cardiovascular: S1S2/RRR  Respiratory: CTAB  Abdomen: soft, NT, BS present  Musculoskeletal: no edema c/c   Data Reviewed: Basic Metabolic Panel:  Recent Labs Lab 10/09/13 1310 10/09/13 2045 10/10/13 0540  NA 146  --  144  K 4.0  --  4.3  CL 109  --  109  CO2 26  --  23  GLUCOSE 99  --  172*  BUN 13  --  14  CREATININE 1.02 1.02 0.94  CALCIUM 9.2  --  9.1   Liver Function Tests:  Recent Labs Lab 10/09/13 1310 10/10/13 0540  AST 25 21  ALT 15 14  ALKPHOS 94 92  BILITOT 0.4 0.6  PROT 7.3 6.8  ALBUMIN 3.5 3.2*   No results found for this basename: LIPASE, AMYLASE,  in the last 168 hours No results found for this basename:  AMMONIA,  in the last 168 hours CBC:  Recent Labs Lab 10/09/13 1310 10/09/13 2045 10/10/13 0540  WBC 4.5 3.8* 2.1*  NEUTROABS 3.6  --  1.7  HGB 11.5* 11.1* 11.5*  HCT 34.9* 34.3* 35.2*  MCV 90.4 90.5 91.2  PLT 181 168 174   Cardiac Enzymes: No results found for this basename: CKTOTAL, CKMB, CKMBINDEX, TROPONINI,  in the last 168 hours BNP (last 3 results) No results found for this basename: PROBNP,  in the last 8760 hours CBG: No results found for this basename: GLUCAP,  in the last 168 hours  No results found for this or any previous visit (from the past 240 hour(s)).   Studies: Dg Chest 2 View  10/09/2013   CLINICAL DATA:  Progressive pain in weakness over the last 20 for hr. Left upper extremity tingling and has resolved. Personal history of an mass with progression of symptoms.  EXAM: CHEST  2 VIEW  COMPARISON:  Two-view chest 04/07/2013  FINDINGS: The heart size and mediastinal contours are within normal limits. Both lungs are clear. The visualized skeletal structures are unremarkable.  IMPRESSION: Negative two view chest.   Electronically Signed   By: Lawrence Santiago M.D.   On: 10/09/2013 21:38   Mr Jeri Cos  Wo Contrast  10/09/2013   CLINICAL DATA:  63 year old female with current history of multiple sclerosis. Acute onset right side weakness. Initial encounter.  EXAM: MRI HEAD WITHOUT AND WITH CONTRAST  MRI CERVICAL SPINE WITHOUT AND WITH CONTRAST  TECHNIQUE: Multiplanar, multiecho pulse sequences of the brain and surrounding structures, and cervical spine, to include the craniocervical junction and cervicothoracic junction, were obtained without and with intravenous contrast.  CONTRAST:  95mL MULTIHANCE GADOBENATE DIMEGLUMINE 529 MG/ML IV SOLN  COMPARISON:  Brain MRI and cervical spine MRI 09/26/2012, and earlier.  FINDINGS: MRI HEAD FINDINGS  No restricted diffusion to suggest acute infarction. No midline shift, mass effect, evidence of mass lesion, ventriculomegaly, extra-axial  collection or acute intracranial hemorrhage. Cervicomedullary junction and pituitary are within normal limits. Major intracranial vascular flow voids are stable.  Small scattered cerebral white matter T2 and FLAIR hyperintense lesions appear stable in size and number since 2014. Extent of disease is mild. No cortical encephalomalacia. Deep gray matter nuclei, brainstem and cerebellum appear spared. No abnormal enhancement identified.  Visible internal auditory structures appear normal. Mastoids are clear. Minor paranasal sinus mucosal thickening. Grossly normal orbits soft tissues. Visualized scalp soft tissues are within normal limits. Normal bone marrow signal.  MRI CERVICAL SPINE FINDINGS  Study is intermittently degraded by motion artifact despite repeated imaging attempts.  Chronic straightening and mild reversal of cervical lordosis. No marrow edema or evidence of acute osseous abnormality.  Chronic C5 ventral spinal cord T2 and STIR hyperintense lesion, on axial images appears to primarily effect central gray matter and ventral white matter ( series 20 in 1,600 image 13). No associated enhancement. No definite new or additional cervical spinal cord lesion. No definite abnormal cervical spinal cord enhancement.  Better demonstrated in 2014 was a ventral T2-T3 thoracic cord lesion, which probably also is stable.  Chronic disc and endplate degeneration from C4-C5 to C6-C7. No associated spinal stenosis. There is associated moderate to severe biforaminal stenosis at these levels, sparing the right C6 neuroforamen, stable.  2 cm T2 hyperintense right thyroid nodule appears increased since 2011 (subcentimeter at that time. Otherwise negative paraspinal soft tissues.  IMPRESSION: 1. Stable mild chronic cerebral white matter signal changes. No acute intracranial abnormality. 2. Chronic C5-C6 spinal cord lesion appears stable since 2014. No new cervical cord lesion or evidence of active demyelination. 3. 2 cm right  thyroid nodule has enlarged since 2011. Recommend further evaluation with thyroid ultrasound. If patient is clinically hyperthyroid, consider nuclear medicine thyroid uptake and scan. 4. Chronic cervical spine degenerative changes C4-C5 through C6-C7.   Electronically Signed   By: Lars Pinks M.D.   On: 10/09/2013 19:35   Mr Cervical Spine W Wo Contrast  10/09/2013   CLINICAL DATA:  63 year old female with current history of multiple sclerosis. Acute onset right side weakness. Initial encounter.  EXAM: MRI HEAD WITHOUT AND WITH CONTRAST  MRI CERVICAL SPINE WITHOUT AND WITH CONTRAST  TECHNIQUE: Multiplanar, multiecho pulse sequences of the brain and surrounding structures, and cervical spine, to include the craniocervical junction and cervicothoracic junction, were obtained without and with intravenous contrast.  CONTRAST:  4mL MULTIHANCE GADOBENATE DIMEGLUMINE 529 MG/ML IV SOLN  COMPARISON:  Brain MRI and cervical spine MRI 09/26/2012, and earlier.  FINDINGS: MRI HEAD FINDINGS  No restricted diffusion to suggest acute infarction. No midline shift, mass effect, evidence of mass lesion, ventriculomegaly, extra-axial collection or acute intracranial hemorrhage. Cervicomedullary junction and pituitary are within normal limits. Major intracranial vascular flow voids are stable.  Small scattered  cerebral white matter T2 and FLAIR hyperintense lesions appear stable in size and number since 2014. Extent of disease is mild. No cortical encephalomalacia. Deep gray matter nuclei, brainstem and cerebellum appear spared. No abnormal enhancement identified.  Visible internal auditory structures appear normal. Mastoids are clear. Minor paranasal sinus mucosal thickening. Grossly normal orbits soft tissues. Visualized scalp soft tissues are within normal limits. Normal bone marrow signal.  MRI CERVICAL SPINE FINDINGS  Study is intermittently degraded by motion artifact despite repeated imaging attempts.  Chronic straightening and  mild reversal of cervical lordosis. No marrow edema or evidence of acute osseous abnormality.  Chronic C5 ventral spinal cord T2 and STIR hyperintense lesion, on axial images appears to primarily effect central gray matter and ventral white matter ( series 20 in 1,600 image 13). No associated enhancement. No definite new or additional cervical spinal cord lesion. No definite abnormal cervical spinal cord enhancement.  Better demonstrated in 2014 was a ventral T2-T3 thoracic cord lesion, which probably also is stable.  Chronic disc and endplate degeneration from C4-C5 to C6-C7. No associated spinal stenosis. There is associated moderate to severe biforaminal stenosis at these levels, sparing the right C6 neuroforamen, stable.  2 cm T2 hyperintense right thyroid nodule appears increased since 2011 (subcentimeter at that time. Otherwise negative paraspinal soft tissues.  IMPRESSION: 1. Stable mild chronic cerebral white matter signal changes. No acute intracranial abnormality. 2. Chronic C5-C6 spinal cord lesion appears stable since 2014. No new cervical cord lesion or evidence of active demyelination. 3. 2 cm right thyroid nodule has enlarged since 2011. Recommend further evaluation with thyroid ultrasound. If patient is clinically hyperthyroid, consider nuclear medicine thyroid uptake and scan. 4. Chronic cervical spine degenerative changes C4-C5 through C6-C7.   Electronically Signed   By: Lars Pinks M.D.   On: 10/09/2013 19:35    Scheduled Meds: . heparin  5,000 Units Subcutaneous 3 times per day  . methylPREDNISolone (SOLU-MEDROL) injection  1,000 mg Intravenous QHS  . multivitamin with minerals  1 tablet Oral Daily  . pantoprazole  40 mg Oral Daily  . pregabalin  50 mg Oral TID   Continuous Infusions: . sodium chloride 100 mL/hr at 10/10/13 1010   Antibiotics Given (last 72 hours)   None      Active Problems:   Multiple sclerosis   Emesis    Time spent: 29min    [facility] Pager [phone]. If 7PM-7AM, please contact night-coverage at [email or web address], password [facility] 10/10/2013, 1:08 PM  LOS: 1 day

## 2013-10-11 DIAGNOSIS — R112 Nausea with vomiting, unspecified: Secondary | ICD-10-CM

## 2013-10-11 LAB — COMPREHENSIVE METABOLIC PANEL
ALBUMIN: 2.9 g/dL — AB (ref 3.5–5.2)
ALT: 11 U/L (ref 0–35)
AST: 16 U/L (ref 0–37)
Alkaline Phosphatase: 78 U/L (ref 39–117)
Anion gap: 11 (ref 5–15)
BUN: 17 mg/dL (ref 6–23)
CALCIUM: 8.6 mg/dL (ref 8.4–10.5)
CO2: 23 meq/L (ref 19–32)
Chloride: 109 mEq/L (ref 96–112)
Creatinine, Ser: 0.95 mg/dL (ref 0.50–1.10)
GFR calc Af Amer: 71 mL/min — ABNORMAL LOW (ref >90)
GFR, EST NON AFRICAN AMERICAN: 62 mL/min — AB (ref >90)
Glucose, Bld: 163 mg/dL — ABNORMAL HIGH (ref 70–99)
Potassium: 4.3 mEq/L (ref 3.7–5.3)
SODIUM: 143 meq/L (ref 137–147)
Total Bilirubin: 0.4 mg/dL (ref 0.3–1.2)
Total Protein: 6.1 g/dL (ref 6.0–8.3)

## 2013-10-11 LAB — CBC WITH DIFFERENTIAL/PLATELET
Basophils Absolute: 0 10*3/uL (ref 0.0–0.1)
Basophils Relative: 0 % (ref 0–1)
EOS ABS: 0 10*3/uL (ref 0.0–0.7)
Eosinophils Relative: 0 % (ref 0–5)
HEMATOCRIT: 30.3 % — AB (ref 36.0–46.0)
Hemoglobin: 9.9 g/dL — ABNORMAL LOW (ref 12.0–15.0)
Lymphocytes Relative: 6 % — ABNORMAL LOW (ref 12–46)
Lymphs Abs: 0.5 10*3/uL — ABNORMAL LOW (ref 0.7–4.0)
MCH: 29.1 pg (ref 26.0–34.0)
MCHC: 32.7 g/dL (ref 30.0–36.0)
MCV: 89.1 fL (ref 78.0–100.0)
MONO ABS: 0.1 10*3/uL (ref 0.1–1.0)
MONOS PCT: 1 % — AB (ref 3–12)
Neutro Abs: 7.5 10*3/uL (ref 1.7–7.7)
Neutrophils Relative %: 93 % — ABNORMAL HIGH (ref 43–77)
Platelets: 163 10*3/uL (ref 150–400)
RBC: 3.4 MIL/uL — ABNORMAL LOW (ref 3.87–5.11)
RDW: 12.9 % (ref 11.5–15.5)
WBC: 8.1 10*3/uL (ref 4.0–10.5)

## 2013-10-11 MED ORDER — ACETAMINOPHEN 325 MG PO TABS
650.0000 mg | ORAL_TABLET | Freq: Four times a day (QID) | ORAL | Status: DC | PRN
  Administered 2013-10-11: 650 mg via ORAL
  Filled 2013-10-11: qty 2

## 2013-10-11 NOTE — Progress Notes (Signed)
Subjective: Patient reports that she continues to do better.  Tolerating Solumedrol.  Now interested in disease modifying therapy.    Objective: Current vital signs: BP 123/59  Pulse 70  Temp(Src) 98.5 F (36.9 C) (Oral)  Resp 16  Ht 5\' 5"  (1.651 m)  Wt 48.671 kg (107 lb 4.8 oz)  BMI 17.86 kg/m2  SpO2 98% Vital signs in last 24 hours: Temp:  [97.3 F (36.3 C)-98.5 F (36.9 C)] 98.5 F (36.9 C) (10/10 0500) Pulse Rate:  [67-78] 70 (10/10 0500) Resp:  [16] 16 (10/10 0500) BP: (123-148)/(59-79) 123/59 mmHg (10/10 0500) SpO2:  [98 %-100 %] 98 % (10/10 0500)  Intake/Output from previous day: 10/09 0701 - 10/10 0700 In: 600 [P.O.:600] Out: -  Intake/Output this shift: Total I/O In: 360 [P.O.:360] Out: -  Nutritional status: Cardiac  Neurologic Exam: General: NAD  Mental Status:  Alert, oriented, thought content appropriate. Speech fluent without evidence of aphasia. Able to follow 3 step commands without difficulty.  Cranial Nerves:  II: Discs flat bilaterally; Visual fields grossly normal, pupils equal, round, reactive to light and accommodation  III,IV, VI: ptosis not present, extra-ocular motions intact bilaterally  V,VII: smile symmetric, facial light touch sensation normal bilaterally  VIII: hearing normal bilaterally  IX,X: gag reflex present  XI: bilateral shoulder shrug  XII: midline tongue extension without atrophy or fasciculations  Motor:  Right : Upper extremity 5-/5     Left: Upper extremity 5/5   Lower extremity 3/5 (Distal>prox)           Lower extremity 5/5  Tone and bulk:normal tone throughout; no atrophy noted  Sensory: Pinprick and light touch intact throughout, bilaterally  Deep Tendon Reflexes:  2+ throughout Plantars:  Right: downgoing   Left: downgoing  Cerebellar:  Mild dysmetria with left finger-to-nose, normal heel-to-shin test   Lab Results: Basic Metabolic Panel:  Recent Labs Lab 10/09/13 1310 10/09/13 2045 10/10/13 0540  10/11/13 0336  NA 146  --  144 143  K 4.0  --  4.3 4.3  CL 109  --  109 109  CO2 26  --  23 23  GLUCOSE 99  --  172* 163*  BUN 13  --  14 17  CREATININE 1.02 1.02 0.94 0.95  CALCIUM 9.2  --  9.1 8.6    Liver Function Tests:  Recent Labs Lab 10/09/13 1310 10/10/13 0540 10/11/13 0336  AST 25 21 16   ALT 15 14 11   ALKPHOS 94 92 78  BILITOT 0.4 0.6 0.4  PROT 7.3 6.8 6.1  ALBUMIN 3.5 3.2* 2.9*   No results found for this basename: LIPASE, AMYLASE,  in the last 168 hours No results found for this basename: AMMONIA,  in the last 168 hours  CBC:  Recent Labs Lab 10/09/13 1310 10/09/13 2045 10/10/13 0540 10/11/13 0336  WBC 4.5 3.8* 2.1* 8.1  NEUTROABS 3.6  --  1.7 7.5  HGB 11.5* 11.1* 11.5* 9.9*  HCT 34.9* 34.3* 35.2* 30.3*  MCV 90.4 90.5 91.2 89.1  PLT 181 168 174 163    Cardiac Enzymes: No results found for this basename: CKTOTAL, CKMB, CKMBINDEX, TROPONINI,  in the last 168 hours  Lipid Panel: No results found for this basename: CHOL, TRIG, HDL, CHOLHDL, VLDL, LDLCALC,  in the last 168 hours  CBG: No results found for this basename: GLUCAP,  in the last 168 hours  Microbiology: Results for orders placed in visit on 09/19/10  [facility] - SMEAR     Status: None  Collection Time    09/19/10 10:20 AM      Result Value Ref Range Status   Smear Result Smear Available   Final    Coagulation Studies: No results found for this basename: LABPROT, INR,  in the last 72 hours  Imaging: Dg Chest 2 View  10/09/2013   CLINICAL DATA:  Progressive pain in weakness over the last 20 for hr. Left upper extremity tingling and has resolved. Personal history of an mass with progression of symptoms.  EXAM: CHEST  2 VIEW  COMPARISON:  Two-view chest 04/07/2013  FINDINGS: The heart size and mediastinal contours are within normal limits. Both lungs are clear. The visualized skeletal structures are unremarkable.  IMPRESSION: Negative two view chest.   Electronically Signed   By:  Lawrence Santiago M.D.   On: 10/09/2013 21:38   Mr Jeri Cos WN Contrast  10/09/2013   CLINICAL DATA:  63 year old female with current history of multiple sclerosis. Acute onset right side weakness. Initial encounter.  EXAM: MRI HEAD WITHOUT AND WITH CONTRAST  MRI CERVICAL SPINE WITHOUT AND WITH CONTRAST  TECHNIQUE: Multiplanar, multiecho pulse sequences of the brain and surrounding structures, and cervical spine, to include the craniocervical junction and cervicothoracic junction, were obtained without and with intravenous contrast.  CONTRAST:  1mL MULTIHANCE GADOBENATE DIMEGLUMINE 529 MG/ML IV SOLN  COMPARISON:  Brain MRI and cervical spine MRI 09/26/2012, and earlier.  FINDINGS: MRI HEAD FINDINGS  No restricted diffusion to suggest acute infarction. No midline shift, mass effect, evidence of mass lesion, ventriculomegaly, extra-axial collection or acute intracranial hemorrhage. Cervicomedullary junction and pituitary are within normal limits. Major intracranial vascular flow voids are stable.  Small scattered cerebral white matter T2 and FLAIR hyperintense lesions appear stable in size and number since 2014. Extent of disease is mild. No cortical encephalomalacia. Deep gray matter nuclei, brainstem and cerebellum appear spared. No abnormal enhancement identified.  Visible internal auditory structures appear normal. Mastoids are clear. Minor paranasal sinus mucosal thickening. Grossly normal orbits soft tissues. Visualized scalp soft tissues are within normal limits. Normal bone marrow signal.  MRI CERVICAL SPINE FINDINGS  Study is intermittently degraded by motion artifact despite repeated imaging attempts.  Chronic straightening and mild reversal of cervical lordosis. No marrow edema or evidence of acute osseous abnormality.  Chronic C5 ventral spinal cord T2 and STIR hyperintense lesion, on axial images appears to primarily effect central gray matter and ventral white matter ( series 20 in 1,600 image 13). No  associated enhancement. No definite new or additional cervical spinal cord lesion. No definite abnormal cervical spinal cord enhancement.  Better demonstrated in 2014 was a ventral T2-T3 thoracic cord lesion, which probably also is stable.  Chronic disc and endplate degeneration from C4-C5 to C6-C7. No associated spinal stenosis. There is associated moderate to severe biforaminal stenosis at these levels, sparing the right C6 neuroforamen, stable.  2 cm T2 hyperintense right thyroid nodule appears increased since 2011 (subcentimeter at that time. Otherwise negative paraspinal soft tissues.  IMPRESSION: 1. Stable mild chronic cerebral white matter signal changes. No acute intracranial abnormality. 2. Chronic C5-C6 spinal cord lesion appears stable since 2014. No new cervical cord lesion or evidence of active demyelination. 3. 2 cm right thyroid nodule has enlarged since 2011. Recommend further evaluation with thyroid ultrasound. If patient is clinically hyperthyroid, consider nuclear medicine thyroid uptake and scan. 4. Chronic cervical spine degenerative changes C4-C5 through C6-C7.   Electronically Signed   By: Lars Pinks M.D.   On: 10/09/2013 19:35  Mr Cervical Spine W Wo Contrast  10/09/2013   CLINICAL DATA:  63 year old female with current history of multiple sclerosis. Acute onset right side weakness. Initial encounter.  EXAM: MRI HEAD WITHOUT AND WITH CONTRAST  MRI CERVICAL SPINE WITHOUT AND WITH CONTRAST  TECHNIQUE: Multiplanar, multiecho pulse sequences of the brain and surrounding structures, and cervical spine, to include the craniocervical junction and cervicothoracic junction, were obtained without and with intravenous contrast.  CONTRAST:  11mL MULTIHANCE GADOBENATE DIMEGLUMINE 529 MG/ML IV SOLN  COMPARISON:  Brain MRI and cervical spine MRI 09/26/2012, and earlier.  FINDINGS: MRI HEAD FINDINGS  No restricted diffusion to suggest acute infarction. No midline shift, mass effect, evidence of mass  lesion, ventriculomegaly, extra-axial collection or acute intracranial hemorrhage. Cervicomedullary junction and pituitary are within normal limits. Major intracranial vascular flow voids are stable.  Small scattered cerebral white matter T2 and FLAIR hyperintense lesions appear stable in size and number since 2014. Extent of disease is mild. No cortical encephalomalacia. Deep gray matter nuclei, brainstem and cerebellum appear spared. No abnormal enhancement identified.  Visible internal auditory structures appear normal. Mastoids are clear. Minor paranasal sinus mucosal thickening. Grossly normal orbits soft tissues. Visualized scalp soft tissues are within normal limits. Normal bone marrow signal.  MRI CERVICAL SPINE FINDINGS  Study is intermittently degraded by motion artifact despite repeated imaging attempts.  Chronic straightening and mild reversal of cervical lordosis. No marrow edema or evidence of acute osseous abnormality.  Chronic C5 ventral spinal cord T2 and STIR hyperintense lesion, on axial images appears to primarily effect central gray matter and ventral white matter ( series 20 in 1,600 image 13). No associated enhancement. No definite new or additional cervical spinal cord lesion. No definite abnormal cervical spinal cord enhancement.  Better demonstrated in 2014 was a ventral T2-T3 thoracic cord lesion, which probably also is stable.  Chronic disc and endplate degeneration from C4-C5 to C6-C7. No associated spinal stenosis. There is associated moderate to severe biforaminal stenosis at these levels, sparing the right C6 neuroforamen, stable.  2 cm T2 hyperintense right thyroid nodule appears increased since 2011 (subcentimeter at that time. Otherwise negative paraspinal soft tissues.  IMPRESSION: 1. Stable mild chronic cerebral white matter signal changes. No acute intracranial abnormality. 2. Chronic C5-C6 spinal cord lesion appears stable since 2014. No new cervical cord lesion or evidence of  active demyelination. 3. 2 cm right thyroid nodule has enlarged since 2011. Recommend further evaluation with thyroid ultrasound. If patient is clinically hyperthyroid, consider nuclear medicine thyroid uptake and scan. 4. Chronic cervical spine degenerative changes C4-C5 through C6-C7.   Electronically Signed   By: Lars Pinks M.D.   On: 10/09/2013 19:35    Medications:  I have reviewed the patient's current medications. Scheduled: . heparin  5,000 Units Subcutaneous 3 times per day  . methylPREDNISolone (SOLU-MEDROL) injection  1,000 mg Intravenous QHS  . multivitamin with minerals  1 tablet Oral Daily  . pantoprazole  40 mg Oral Daily  . pregabalin  50 mg Oral TID    Assessment/Plan: Patient improving on Solumedrol.  Dr. Krista Blue aware of admission and treatment plan.  Last dose of Solumedrol tonight.  Recommendations: 1.  PT evaluation.  Patient lives at home alone. 2.  Follow up with Dr. Krista Blue as an outpatient with decisions to be made at that time concerning DMT   LOS: 2 days   [name], MD [facility] [phone] 10/11/2013  10:43 AM

## 2013-10-11 NOTE — Progress Notes (Signed)
[facility] PROGRESS NOTE  Erika Murphy BZJ:[id number] DOB: 24-May-1950 DOA: 10/09/2013 PCP: Elby Showers, MD  Assessment/Plan:   1. MS with chronic right sided weakness admitted with worsening weakness right leg greater than arm and paresthesias fingertips bilaterally. Last MS relapse many years ago. Patient has been off DMT for several years. MRI brain and cervical spine with and without contrast showed no new demyelinating lesions.  MS relapse versus progressive disease.  -continue IV solumedrol 1 gram daily x Day2/3  -continue Lyrica and baclofen  -Physical therapy following, HH PT recommended -Fu with Dr.Yan   2. Emesis -transient resolved  3. Low TSH -check T4  DVt proph: Hep SQ  Code Status: Full Code Family Communication: none at bedside Disposition Plan: home tomorrow    Consultants:  Neurology  HPI/Subjective: Slightly improved RLE strength  Objective: Filed Vitals:   10/11/13 0500  BP: 123/59  Pulse: 70  Temp: 98.5 F (36.9 C)  Resp: 16    Intake/Output Summary (Last 24 hours) at 10/11/13 1450 Last data filed at 10/11/13 0915  Gross per 24 hour  Intake    720 ml  Output      0 ml  Net    720 ml   Filed Weights   10/09/13 1222 10/09/13 2152  Weight: 44.906 kg (99 lb) 48.671 kg (107 lb 4.8 oz)    Exam:   General:  AAOx3  Cardiovascular: S1S2/RRR  Respiratory: CTAB  Abdomen: soft, NT, BS present  Musculoskeletal: no edema c/c   Neuro: RLE weakness improving  Data Reviewed: Basic Metabolic Panel:  Recent Labs Lab 10/09/13 1310 10/09/13 2045 10/10/13 0540 10/11/13 0336  NA 146  --  144 143  K 4.0  --  4.3 4.3  CL 109  --  109 109  CO2 26  --  23 23  GLUCOSE 99  --  172* 163*  BUN 13  --  14 17  CREATININE 1.02 1.02 0.94 0.95  CALCIUM 9.2  --  9.1 8.6   Liver Function Tests:  Recent Labs Lab 10/09/13 1310 10/10/13 0540 10/11/13 0336  AST 25 21 16   ALT 15 14 11   ALKPHOS 94 92 78  BILITOT 0.4 0.6 0.4  PROT 7.3  6.8 6.1  ALBUMIN 3.5 3.2* 2.9*   No results found for this basename: LIPASE, AMYLASE,  in the last 168 hours No results found for this basename: AMMONIA,  in the last 168 hours CBC:  Recent Labs Lab 10/09/13 1310 10/09/13 2045 10/10/13 0540 10/11/13 0336  WBC 4.5 3.8* 2.1* 8.1  NEUTROABS 3.6  --  1.7 7.5  HGB 11.5* 11.1* 11.5* 9.9*  HCT 34.9* 34.3* 35.2* 30.3*  MCV 90.4 90.5 91.2 89.1  PLT 181 168 174 163   Cardiac Enzymes: No results found for this basename: CKTOTAL, CKMB, CKMBINDEX, TROPONINI,  in the last 168 hours BNP (last 3 results) No results found for this basename: PROBNP,  in the last 8760 hours CBG: No results found for this basename: GLUCAP,  in the last 168 hours  No results found for this or any previous visit (from the past 240 hour(s)).   Studies: Dg Chest 2 View  10/09/2013   CLINICAL DATA:  Progressive pain in weakness over the last 20 for hr. Left upper extremity tingling and has resolved. Personal history of an mass with progression of symptoms.  EXAM: CHEST  2 VIEW  COMPARISON:  Two-view chest 04/07/2013  FINDINGS: The heart size and mediastinal contours are within normal limits.  Both lungs are clear. The visualized skeletal structures are unremarkable.  IMPRESSION: Negative two view chest.   Electronically Signed   By: Lawrence Santiago M.D.   On: 10/09/2013 21:38   [name] HC Contrast  10/09/2013   CLINICAL DATA:  63 year old female with current history of multiple sclerosis. Acute onset right side weakness. Initial encounter.  EXAM: MRI HEAD WITHOUT AND WITH CONTRAST  MRI CERVICAL SPINE WITHOUT AND WITH CONTRAST  TECHNIQUE: Multiplanar, multiecho pulse sequences of the brain and surrounding structures, and cervical spine, to include the craniocervical junction and cervicothoracic junction, were obtained without and with intravenous contrast.  CONTRAST:  59mL MULTIHANCE GADOBENATE DIMEGLUMINE 529 MG/ML IV SOLN  COMPARISON:  Brain MRI and cervical spine MRI  09/26/2012, and earlier.  FINDINGS: MRI HEAD FINDINGS  No restricted diffusion to suggest acute infarction. No midline shift, mass effect, evidence of mass lesion, ventriculomegaly, extra-axial collection or acute intracranial hemorrhage. Cervicomedullary junction and pituitary are within normal limits. Major intracranial vascular flow voids are stable.  Small scattered cerebral white matter T2 and FLAIR hyperintense lesions appear stable in size and number since 2014. Extent of disease is mild. No cortical encephalomalacia. Deep gray matter nuclei, brainstem and cerebellum appear spared. No abnormal enhancement identified.  Visible internal auditory structures appear normal. Mastoids are clear. Minor paranasal sinus mucosal thickening. Grossly normal orbits soft tissues. Visualized scalp soft tissues are within normal limits. Normal bone marrow signal.  MRI CERVICAL SPINE FINDINGS  Study is intermittently degraded by motion artifact despite repeated imaging attempts.  Chronic straightening and mild reversal of cervical lordosis. No marrow edema or evidence of acute osseous abnormality.  Chronic C5 ventral spinal cord T2 and STIR hyperintense lesion, on axial images appears to primarily effect central gray matter and ventral white matter ( series 20 in 1,600 image 13). No associated enhancement. No definite new or additional cervical spinal cord lesion. No definite abnormal cervical spinal cord enhancement.  Better demonstrated in 2014 was a ventral T2-T3 thoracic cord lesion, which probably also is stable.  Chronic disc and endplate degeneration from C4-C5 to C6-C7. No associated spinal stenosis. There is associated moderate to severe biforaminal stenosis at these levels, sparing the right C6 neuroforamen, stable.  2 cm T2 hyperintense right thyroid nodule appears increased since 2011 (subcentimeter at that time. Otherwise negative paraspinal soft tissues.  IMPRESSION: 1. Stable mild chronic cerebral white matter  signal changes. No acute intracranial abnormality. 2. Chronic C5-C6 spinal cord lesion appears stable since 2014. No new cervical cord lesion or evidence of active demyelination. 3. 2 cm right thyroid nodule has enlarged since 2011. Recommend further evaluation with thyroid ultrasound. If patient is clinically hyperthyroid, consider nuclear medicine thyroid uptake and scan. 4. Chronic cervical spine degenerative changes C4-C5 through C6-C7.   Electronically Signed   By: Lars Pinks M.D.   On: 10/09/2013 19:35   Mr Cervical Spine W Wo Contrast  10/09/2013   CLINICAL DATA:  63 year old female with current history of multiple sclerosis. Acute onset right side weakness. Initial encounter.  EXAM: MRI HEAD WITHOUT AND WITH CONTRAST  MRI CERVICAL SPINE WITHOUT AND WITH CONTRAST  TECHNIQUE: Multiplanar, multiecho pulse sequences of the brain and surrounding structures, and cervical spine, to include the craniocervical junction and cervicothoracic junction, were obtained without and with intravenous contrast.  CONTRAST:  83mL MULTIHANCE GADOBENATE DIMEGLUMINE 529 MG/ML IV SOLN  COMPARISON:  Brain MRI and cervical spine MRI 09/26/2012, and earlier.  FINDINGS: MRI HEAD FINDINGS  No restricted diffusion to  suggest acute infarction. No midline shift, mass effect, evidence of mass lesion, ventriculomegaly, extra-axial collection or acute intracranial hemorrhage. Cervicomedullary junction and pituitary are within normal limits. Major intracranial vascular flow voids are stable.  Small scattered cerebral white matter T2 and FLAIR hyperintense lesions appear stable in size and number since 2014. Extent of disease is mild. No cortical encephalomalacia. Deep gray matter nuclei, brainstem and cerebellum appear spared. No abnormal enhancement identified.  Visible internal auditory structures appear normal. Mastoids are clear. Minor paranasal sinus mucosal thickening. Grossly normal orbits soft tissues. Visualized scalp soft tissues are  within normal limits. Normal bone marrow signal.  MRI CERVICAL SPINE FINDINGS  Study is intermittently degraded by motion artifact despite repeated imaging attempts.  Chronic straightening and mild reversal of cervical lordosis. No marrow edema or evidence of acute osseous abnormality.  Chronic C5 ventral spinal cord T2 and STIR hyperintense lesion, on axial images appears to primarily effect central gray matter and ventral white matter ( series 20 in 1,600 image 13). No associated enhancement. No definite new or additional cervical spinal cord lesion. No definite abnormal cervical spinal cord enhancement.  Better demonstrated in 2014 was a ventral T2-T3 thoracic cord lesion, which probably also is stable.  Chronic disc and endplate degeneration from C4-C5 to C6-C7. No associated spinal stenosis. There is associated moderate to severe biforaminal stenosis at these levels, sparing the right C6 neuroforamen, stable.  2 cm T2 hyperintense right thyroid nodule appears increased since 2011 (subcentimeter at that time. Otherwise negative paraspinal soft tissues.  IMPRESSION: 1. Stable mild chronic cerebral white matter signal changes. No acute intracranial abnormality. 2. Chronic C5-C6 spinal cord lesion appears stable since 2014. No new cervical cord lesion or evidence of active demyelination. 3. 2 cm right thyroid nodule has enlarged since 2011. Recommend further evaluation with thyroid ultrasound. If patient is clinically hyperthyroid, consider nuclear medicine thyroid uptake and scan. 4. Chronic cervical spine degenerative changes C4-C5 through C6-C7.   Electronically Signed   By: Lars Pinks M.D.   On: 10/09/2013 19:35    Scheduled Meds: . heparin  5,000 Units Subcutaneous 3 times per day  . methylPREDNISolone (SOLU-MEDROL) injection  1,000 mg Intravenous QHS  . multivitamin with minerals  1 tablet Oral Daily  . pantoprazole  40 mg Oral Daily  . pregabalin  50 mg Oral TID   Continuous Infusions: . sodium  chloride 75 mL/hr at 10/10/13 1515   Antibiotics Given (last 72 hours)   None      Active Problems:   Multiple sclerosis   Emesis   Multiple sclerosis exacerbation    Time spent: 37min    [facility] Pager [phone]. If 7PM-7AM, please contact night-coverage at [email or web address], password [facility] 10/11/2013, 2:50 PM  LOS: 2 days

## 2013-10-11 NOTE — Progress Notes (Signed)
Physical Therapy Treatment Patient Details Name: JAXON MYNHIER MRN: [id number] DOB: [birth year] Today's Date: 10/11/2013    History of Present Illness Patient is a 63 yo female admitted 10/09/13 with increased weakness Rt LE and emesis.  Patient with MS exacerbation.  PMH:  Gastric CA, MS (for approx 20 years).    PT Comments    Patient making good gains with strength and mobility.  Patient reports she is currently using a bariatric walker obtained >15 years ago.  Feel patient needs a Rollator (4-wheeled RW with seat) for safety with ambulation at home.  Will allow her to stop and rest on seat with rollator locked when she walks longer distances outside of her home.   Follow Up Recommendations  Home health PT;Supervision/Assistance - 24 hour     Equipment Recommendations  Rollator (4-wheeled RW with seat)   Recommendations for Other Services       Precautions / Restrictions Precautions Precautions: Fall Restrictions Weight Bearing Restrictions: No    Mobility  Bed Mobility Overal bed mobility: Needs Assistance Bed Mobility: Supine to Sit;Sit to Supine     Supine to sit: Supervision Sit to supine: Supervision   General bed mobility comments: Supervision for safety only  Transfers Overall transfer level: Needs assistance Equipment used: Rolling walker (2 wheeled) Transfers: Sit to/from Stand Sit to Stand: Supervision         General transfer comment: Cues for hand placement.  Supervision for safety only.  Ambulation/Gait Ambulation/Gait assistance: Min guard Ambulation Distance (Feet): 64 Feet Assistive device: Rolling walker (2 wheeled) Gait Pattern/deviations: Step-through pattern;Decreased stride length;Decreased step length - right;Decreased dorsiflexion - right Gait velocity: Decreased Gait velocity interpretation: Below normal speed for age/gender General Gait Details: Patient with upright posture during gait.  Noted difficulty with swing phase with RLE,  decreased dorsiflexion and foot dragging floor.  Slight instability of Rt knee during stance.  Min guard for safety with gait, especially during turns.   Stairs            Wheelchair Mobility    Modified Rankin (Stroke Patients Only)       Balance                                    Cognition Arousal/Alertness: Awake/alert Behavior During Therapy: WFL for tasks assessed/performed Overall Cognitive Status: Within Functional Limits for tasks assessed                      Exercises General Exercises - Lower Extremity Ankle Circles/Pumps: AROM;AAROM;Right;10 reps;Seated Long Arc Quad: AROM;AAROM;Right;10 reps;Seated Hip ABduction/ADduction: AROM;Right;10 reps;Seated Hip Flexion/Marching: AROM;AAROM;Right;5 reps;Seated    General Comments        Pertinent Vitals/Pain Pain Assessment: No/denies pain    Home Living                      Prior Function            PT Goals (current goals can now be found in the care plan section) Progress towards PT goals: Progressing toward goals    Frequency  Min 3X/week    PT Plan Current plan remains appropriate;Equipment recommendations need to be updated    Co-evaluation             End of Session Equipment Utilized During Treatment: Gait belt Activity Tolerance: Patient tolerated treatment well Patient left: in bed;with call bell/phone within reach  Time: 8182-9937 PT Time Calculation (min): 25 min  Charges:  $Gait Training: 8-22 mins $Therapeutic Exercise: 8-22 mins                    G Codes:      Despina Pole 10/26/13, 12:36 PM Carita Pian. Sanjuana Kava, [place] Pager [phone]

## 2013-10-11 NOTE — ED Provider Notes (Signed)
Medical screening examination/treatment/procedure(s) were performed by non-physician practitioner and as supervising physician I was immediately available for consultation/collaboration.   EKG Interpretation   Date/Time:  Thursday October 09 2013 13:19:08 EDT Ventricular Rate:  64 PR Interval:  180 QRS Duration: 88 QT Interval:  425 QTC Calculation: 438 R Axis:   87 Text Interpretation:  Atrial-paced complexes Biatrial enlargement  Borderline right axis deviation Nonspecific T abnrm, anterolateral leads  ST elevation, consider inferior injury ED PHYSICIAN INTERPRETATION  AVAILABLE IN [facility] Confirmed by TEST, Record ([id number]) on  10/11/2013 10:01:31 AM       Jasper Riling. Alvino Chapel, MD 10/11/13 [phone]

## 2013-10-12 DIAGNOSIS — R269 Unspecified abnormalities of gait and mobility: Secondary | ICD-10-CM

## 2013-10-12 DIAGNOSIS — R1111 Vomiting without nausea: Secondary | ICD-10-CM

## 2013-10-12 LAB — CBC WITH DIFFERENTIAL/PLATELET
BASOS PCT: 0 % (ref 0–1)
Basophils Absolute: 0 10*3/uL (ref 0.0–0.1)
EOS ABS: 0 10*3/uL (ref 0.0–0.7)
Eosinophils Relative: 0 % (ref 0–5)
HCT: 33.3 % — ABNORMAL LOW (ref 36.0–46.0)
HEMOGLOBIN: 11.1 g/dL — AB (ref 12.0–15.0)
Lymphocytes Relative: 6 % — ABNORMAL LOW (ref 12–46)
Lymphs Abs: 0.4 10*3/uL — ABNORMAL LOW (ref 0.7–4.0)
MCH: 29.8 pg (ref 26.0–34.0)
MCHC: 33.3 g/dL (ref 30.0–36.0)
MCV: 89.3 fL (ref 78.0–100.0)
MONOS PCT: 1 % — AB (ref 3–12)
Monocytes Absolute: 0.1 10*3/uL (ref 0.1–1.0)
NEUTROS ABS: 6.2 10*3/uL (ref 1.7–7.7)
NEUTROS PCT: 93 % — AB (ref 43–77)
Platelets: 158 10*3/uL (ref 150–400)
RBC: 3.73 MIL/uL — AB (ref 3.87–5.11)
RDW: 13 % (ref 11.5–15.5)
WBC: 6.6 10*3/uL (ref 4.0–10.5)

## 2013-10-12 LAB — COMPREHENSIVE METABOLIC PANEL
ALK PHOS: 85 U/L (ref 39–117)
ALT: 17 U/L (ref 0–35)
ANION GAP: 13 (ref 5–15)
AST: 23 U/L (ref 0–37)
Albumin: 3.1 g/dL — ABNORMAL LOW (ref 3.5–5.2)
BUN: 16 mg/dL (ref 6–23)
CALCIUM: 8.9 mg/dL (ref 8.4–10.5)
CO2: 23 meq/L (ref 19–32)
Chloride: 107 mEq/L (ref 96–112)
Creatinine, Ser: 0.91 mg/dL (ref 0.50–1.10)
GFR calc Af Amer: 75 mL/min — ABNORMAL LOW (ref >90)
GFR calc non Af Amer: 65 mL/min — ABNORMAL LOW (ref >90)
Glucose, Bld: 155 mg/dL — ABNORMAL HIGH (ref 70–99)
Potassium: 4.5 mEq/L (ref 3.7–5.3)
Sodium: 143 mEq/L (ref 137–147)
TOTAL PROTEIN: 6.8 g/dL (ref 6.0–8.3)
Total Bilirubin: 0.5 mg/dL (ref 0.3–1.2)

## 2013-10-12 NOTE — Progress Notes (Signed)
Physical Therapy Treatment Patient Details Name: KHYLEIGH FURNEY MRN: [id number] DOB: May 19, 1950 Today's Date: 2013/10/14    History of Present Illness Patient is a 63 yo female admitted 10/09/13 with increased weakness Rt LE and emesis.  Patient with MS exacerbation.  PMH:  Gastric CA, MS (for approx 20 years).    PT Comments    Patient improved with mobility.  Reviewed home safety with patient, especially with ambulation.  Reviewed equipment needs. Encouraged patient to continue mobility, without fatiguing self.  Reviewed HHPT recommended.  Patient ready for d/c from PT perspective.  Follow Up Recommendations  Home health PT;Supervision/Assistance - 24 hour     Equipment Recommendations  Other (comment) (Rollator)    Recommendations for Other Services       Precautions / Restrictions Precautions Precautions: Fall Restrictions Weight Bearing Restrictions: No    Mobility  Bed Mobility Overal bed mobility: Modified Independent Bed Mobility: Supine to Sit;Sit to Supine     Supine to sit: Modified independent (Device/Increase time)        Transfers                 General transfer comment: Declined - reports up with RW  Ambulation/Gait             General Gait Details: Reviewed safety with ambulation with rollator.   Stairs            Wheelchair Mobility    Modified Rankin (Stroke Patients Only)       Balance                                    Cognition Arousal/Alertness: Awake/alert Behavior During Therapy: WFL for tasks assessed/performed Overall Cognitive Status: Within Functional Limits for tasks assessed                      Exercises      General Comments        Pertinent Vitals/Pain Pain Assessment: No/denies pain    Home Living                      Prior Function            PT Goals (current goals can now be found in the care plan section) Progress towards PT goals: Progressing toward  goals    Frequency       PT Plan Current plan remains appropriate;Equipment recommendations need to be updated    Co-evaluation             End of Session   Activity Tolerance: Patient tolerated treatment well Patient left: in bed;with call bell/phone within reach     Time: 1016-1024 PT Time Calculation (min): 8 min  Charges:  $Self Care/Home Management: 8-22                    G Codes:      Despina Pole 10/14/2013, 12:29 PM Carita Pian. Sanjuana Kava, [place] Pager [phone]

## 2013-10-12 NOTE — Progress Notes (Signed)
Subjective: Patient reports feeling better.  Was able to ambulate with therapy.    Objective: Current vital signs: BP 143/62  Pulse 61  Temp(Src) 98.4 F (36.9 C) (Oral)  Resp 18  Ht 5\' 5"  (1.651 m)  Wt 48.671 kg (107 lb 4.8 oz)  BMI 17.86 kg/m2  SpO2 100% Vital signs in last 24 hours: Temp:  [98.3 F (36.8 C)-98.5 F (36.9 C)] 98.4 F (36.9 C) (10/11 0522) Pulse Rate:  [61-75] 61 (10/11 0522) Resp:  [18] 18 (10/11 0522) BP: (131-153)/(56-62) 143/62 mmHg (10/11 0522) SpO2:  [98 %-100 %] 100 % (10/11 0522)  Intake/Output from previous day: 10/10 0701 - 10/11 0700 In: 360 [P.O.:360] Out: -  Intake/Output this shift: Total I/O In: 174 [IV Piggyback:174] Out: -  Nutritional status: Cardiac  Neurologic Exam: Mental Status:  Alert, oriented, thought content appropriate. Speech fluent without evidence of aphasia. Able to follow 3 step commands without difficulty.  Cranial Nerves:  II: Discs flat bilaterally; Visual fields grossly normal, pupils equal, round, reactive to light and accommodation  III,IV, VI: ptosis not present, extra-ocular motions intact bilaterally  V,VII: smile symmetric, facial light touch sensation normal bilaterally  VIII: hearing normal bilaterally  IX,X: gag reflex present  XI: bilateral shoulder shrug  XII: midline tongue extension without atrophy or fasciculations  Motor:  Right : Upper extremity 5-/5        Left: Upper extremity 5/5   Lower extremity 3/5 (Distal>prox)   Lower extremity 5/5  Tone and bulk:normal tone throughout; no atrophy noted  Sensory: Pinprick and light touch intact throughout, bilaterally  Deep Tendon Reflexes:  2+ throughout  Plantars:  Right: downgoing   Left: downgoing    Lab Results: Basic Metabolic Panel:  Recent Labs Lab 10/09/13 1310 10/09/13 2045 10/10/13 0540 10/11/13 0336 10/12/13 0600  NA 146  --  144 143 143  K 4.0  --  4.3 4.3 4.5  CL 109  --  109 109 107  CO2 26  --  23 23 23   GLUCOSE 99  --   172* 163* 155*  BUN 13  --  14 17 16   CREATININE 1.02 1.02 0.94 0.95 0.91  CALCIUM 9.2  --  9.1 8.6 8.9    Liver Function Tests:  Recent Labs Lab 10/09/13 1310 10/10/13 0540 10/11/13 0336 10/12/13 0600  AST 25 21 16 23   ALT 15 14 11 17   ALKPHOS 94 92 78 85  BILITOT 0.4 0.6 0.4 0.5  PROT 7.3 6.8 6.1 6.8  ALBUMIN 3.5 3.2* 2.9* 3.1*   No results found for this basename: LIPASE, AMYLASE,  in the last 168 hours No results found for this basename: AMMONIA,  in the last 168 hours  CBC:  Recent Labs Lab 10/09/13 1310 10/09/13 2045 10/10/13 0540 10/11/13 0336 10/12/13 0600  WBC 4.5 3.8* 2.1* 8.1 6.6  NEUTROABS 3.6  --  1.7 7.5 6.2  HGB 11.5* 11.1* 11.5* 9.9* 11.1*  HCT 34.9* 34.3* 35.2* 30.3* 33.3*  MCV 90.4 90.5 91.2 89.1 89.3  PLT 181 168 174 163 158    Cardiac Enzymes: No results found for this basename: CKTOTAL, CKMB, CKMBINDEX, TROPONINI,  in the last 168 hours  Lipid Panel: No results found for this basename: CHOL, TRIG, HDL, CHOLHDL, VLDL, LDLCALC,  in the last 168 hours  CBG: No results found for this basename: GLUCAP,  in the last 168 hours  Microbiology: Results for orders placed in visit on 09/19/10  [place]  Status: None   Collection Time    09/19/10 10:20 AM      Result Value Ref Range Status   Smear Result Smear Available   Final    Coagulation Studies: No results found for this basename: LABPROT, INR,  in the last 72 hours  Imaging: No results found.  Medications:  I have reviewed the patient's current medications. Scheduled: . heparin  5,000 Units Subcutaneous 3 times per day  . methylPREDNISolone (SOLU-MEDROL) injection  1,000 mg Intravenous QHS  . multivitamin with minerals  1 tablet Oral Daily  . pantoprazole  40 mg Oral Daily  . pregabalin  50 mg Oral TID    Assessment/Plan: Patient s/p 3 days of Solumedrol.  Has noted improvement.  Ambulating with therapy.  Recommendations: 1.  D/C Solumedrol.  No steroid  taper required 2.  Patient to follow up with Dr. Krista Murphy as an outpatient    LOS: 3 days   Erika Goodell, MD [facility] [phone] 10/12/2013  12:11 PM

## 2013-10-12 NOTE — Discharge Summary (Signed)
Physician Discharge Summary  Erika Murphy XJO:[id number] DOB: 11-Feb-1950 DOA: 10/09/2013  PCP: Elby Showers, MD  Admit date: 10/09/2013 Discharge date: 10/12/2013  Time spent: 45 minutes  Recommendations for Outpatient Follow-up:  1. Dr.Yan in 10days 2. Please FU free T4, pending at discharge   Discharge Diagnoses:  Active Problems:   Multiple sclerosis   Emesis   Multiple sclerosis exacerbation   Discharge Condition: stable Diet recommendation: regular  Filed Weights   10/09/13 1222 10/09/13 2152  Weight: 44.906 kg (99 lb) 48.671 kg (107 lb 4.8 oz)    History of present illness:  This is a 63 y.o. year old female with significant past medical history of MS, gastric carcinoma s/p gastrectomy presenting with MS flare and emesis. Pt has baseline history of MS with baseline R sided weakness and chronic pain. Reports worsening pain and weakness over the past day. Has also had mild distal LUE tingling that has resolved. Denies any recent strenuous activity. No recent illnessess. States that she has had MS for almost 20 years with fairly stable disease. States that she was recently put on baclofen for MS.   Hospital Course:  1. MS with chronic right sided weakness admitted with worsening weakness right leg greater than arm and paresthesias fingertips bilaterally. Last MS relapse many years ago. Patient has been off DMT for several years. MRI brain and cervical spine with and without contrast showed no new demyelinating lesions.  MS relapse versus progressive disease.  -completed  IV solumedrol 1 gram daily x 3 Days per Neuro recommendations -showed clinical improvement with this -continue Lyrica and baclofen  -Physical therapy following, HH PT recommended  -Fu with Dr.Yan   2. Emesis  -transient resolved   3. Low TSH  - T4 pending, please FU   Consultations:  Neuro  Discharge Exam: Filed Vitals:   10/12/13 0522  BP: 143/62  Pulse: 61  Temp: 98.4 F (36.9 C)  Resp: 18     General: AAOx3 Cardiovascular: S1S2/RRR Respiratory: CTAB  Discharge Instructions You were cared for by a hospitalist during your hospital stay. If you have any questions about your discharge medications or the care you received while you were in the hospital after you are discharged, you can call the unit and asked to speak with the hospitalist on call if the hospitalist that took care of you is not available. Once you are discharged, your primary care physician will handle any further medical issues. Please note that NO REFILLS for any discharge medications will be authorized once you are discharged, as it is imperative that you return to your primary care physician (or establish a relationship with a primary care physician if you do not have one) for your aftercare needs so that they can reassess your need for medications and monitor your lab values.  Discharge Instructions   Diet general    Complete by:  As directed      Increase activity slowly    Complete by:  As directed           Current Discharge Medication List    CONTINUE these medications which have NOT CHANGED   Details  albuterol (PROVENTIL HFA;VENTOLIN HFA) 108 (90 BASE) MCG/ACT inhaler Inhale 2 puffs into the lungs every 6 (six) hours as needed for wheezing or shortness of breath.    baclofen (LIORESAL) 10 MG tablet Take 1 tablet (10 mg total) by mouth 3 (three) times daily. Qty: 90 each, Refills: 3    diazepam (VALIUM) 5 MG tablet  Take 5 mg by mouth as needed for anxiety.     lansoprazole (PREVACID) 30 MG capsule Take 30 mg by mouth daily at 12 noon.    Multiple Vitamin (MULTIVITAMIN) tablet Take 1 tablet by mouth daily.    pregabalin (LYRICA) 50 MG capsule Take 50 mg by mouth 3 (three) times daily.       No Known Allergies Follow-up Information   Follow up with Marcial Pacas, MD. Schedule an appointment as soon as possible for a visit in 10 days.   Specialty:  Neurology   Contact information:   [place] [address] [phone]        The results of significant diagnostics from this hospitalization (including imaging, microbiology, ancillary and laboratory) are listed below for reference.    Significant Diagnostic Studies: Dg Chest 2 View  10/09/2013   CLINICAL DATA:  Progressive pain in weakness over the last 20 for hr. Left upper extremity tingling and has resolved. Personal history of an mass with progression of symptoms.  EXAM: CHEST  2 VIEW  COMPARISON:  Two-view chest 04/07/2013  FINDINGS: The heart size and mediastinal contours are within normal limits. Both lungs are clear. The visualized skeletal structures are unremarkable.  IMPRESSION: Negative two view chest.   Electronically Signed   By: Lawrence Santiago M.D.   On: 10/09/2013 21:38   Mr Jeri Cos IW Contrast  10/09/2013   CLINICAL DATA:  63 year old female with current history of multiple sclerosis. Acute onset right side weakness. Initial encounter.  EXAM: MRI HEAD WITHOUT AND WITH CONTRAST  MRI CERVICAL SPINE WITHOUT AND WITH CONTRAST  TECHNIQUE: Multiplanar, multiecho pulse sequences of the brain and surrounding structures, and cervical spine, to include the craniocervical junction and cervicothoracic junction, were obtained without and with intravenous contrast.  CONTRAST:  73mL MULTIHANCE GADOBENATE DIMEGLUMINE 529 MG/ML IV SOLN  COMPARISON:  Brain MRI and cervical spine MRI 09/26/2012, and earlier.  FINDINGS: MRI HEAD FINDINGS  No restricted diffusion to suggest acute infarction. No midline shift, mass effect, evidence of mass lesion, ventriculomegaly, extra-axial collection or acute intracranial hemorrhage. Cervicomedullary junction and pituitary are within normal limits. Major intracranial vascular flow voids are stable.  Small scattered cerebral white matter T2 and FLAIR hyperintense lesions appear stable in size and number since 2014. Extent of disease is mild. No cortical encephalomalacia. Deep gray matter  nuclei, brainstem and cerebellum appear spared. No abnormal enhancement identified.  Visible internal auditory structures appear normal. Mastoids are clear. Minor paranasal sinus mucosal thickening. Grossly normal orbits soft tissues. Visualized scalp soft tissues are within normal limits. Normal bone marrow signal.  MRI CERVICAL SPINE FINDINGS  Study is intermittently degraded by motion artifact despite repeated imaging attempts.  Chronic straightening and mild reversal of cervical lordosis. No marrow edema or evidence of acute osseous abnormality.  Chronic C5 ventral spinal cord T2 and STIR hyperintense lesion, on axial images appears to primarily effect central gray matter and ventral white matter ( series 20 in 1,600 image 13). No associated enhancement. No definite new or additional cervical spinal cord lesion. No definite abnormal cervical spinal cord enhancement.  Better demonstrated in 2014 was a ventral T2-T3 thoracic cord lesion, which probably also is stable.  Chronic disc and endplate degeneration from C4-C5 to C6-C7. No associated spinal stenosis. There is associated moderate to severe biforaminal stenosis at these levels, sparing the right C6 neuroforamen, stable.  2 cm T2 hyperintense right thyroid nodule appears increased since 2011 (subcentimeter at that time. Otherwise  negative paraspinal soft tissues.  IMPRESSION: 1. Stable mild chronic cerebral white matter signal changes. No acute intracranial abnormality. 2. Chronic C5-C6 spinal cord lesion appears stable since 2014. No new cervical cord lesion or evidence of active demyelination. 3. 2 cm right thyroid nodule has enlarged since 2011. Recommend further evaluation with thyroid ultrasound. If patient is clinically hyperthyroid, consider nuclear medicine thyroid uptake and scan. 4. Chronic cervical spine degenerative changes C4-C5 through C6-C7.   Electronically Signed   By: Lars Pinks M.D.   On: 10/09/2013 19:35   Mr Cervical Spine W Wo  Contrast  10/09/2013   CLINICAL DATA:  63 year old female with current history of multiple sclerosis. Acute onset right side weakness. Initial encounter.  EXAM: MRI HEAD WITHOUT AND WITH CONTRAST  MRI CERVICAL SPINE WITHOUT AND WITH CONTRAST  TECHNIQUE: Multiplanar, multiecho pulse sequences of the brain and surrounding structures, and cervical spine, to include the craniocervical junction and cervicothoracic junction, were obtained without and with intravenous contrast.  CONTRAST:  79mL MULTIHANCE GADOBENATE DIMEGLUMINE 529 MG/ML IV SOLN  COMPARISON:  Brain MRI and cervical spine MRI 09/26/2012, and earlier.  FINDINGS: MRI HEAD FINDINGS  No restricted diffusion to suggest acute infarction. No midline shift, mass effect, evidence of mass lesion, ventriculomegaly, extra-axial collection or acute intracranial hemorrhage. Cervicomedullary junction and pituitary are within normal limits. Major intracranial vascular flow voids are stable.  Small scattered cerebral white matter T2 and FLAIR hyperintense lesions appear stable in size and number since 2014. Extent of disease is mild. No cortical encephalomalacia. Deep gray matter nuclei, brainstem and cerebellum appear spared. No abnormal enhancement identified.  Visible internal auditory structures appear normal. Mastoids are clear. Minor paranasal sinus mucosal thickening. Grossly normal orbits soft tissues. Visualized scalp soft tissues are within normal limits. Normal bone marrow signal.  MRI CERVICAL SPINE FINDINGS  Study is intermittently degraded by motion artifact despite repeated imaging attempts.  Chronic straightening and mild reversal of cervical lordosis. No marrow edema or evidence of acute osseous abnormality.  Chronic C5 ventral spinal cord T2 and STIR hyperintense lesion, on axial images appears to primarily effect central gray matter and ventral white matter ( series 20 in 1,600 image 13). No associated enhancement. No definite new or additional cervical  spinal cord lesion. No definite abnormal cervical spinal cord enhancement.  Better demonstrated in 2014 was a ventral T2-T3 thoracic cord lesion, which probably also is stable.  Chronic disc and endplate degeneration from C4-C5 to C6-C7. No associated spinal stenosis. There is associated moderate to severe biforaminal stenosis at these levels, sparing the right C6 neuroforamen, stable.  2 cm T2 hyperintense right thyroid nodule appears increased since 2011 (subcentimeter at that time. Otherwise negative paraspinal soft tissues.  IMPRESSION: 1. Stable mild chronic cerebral white matter signal changes. No acute intracranial abnormality. 2. Chronic C5-C6 spinal cord lesion appears stable since 2014. No new cervical cord lesion or evidence of active demyelination. 3. 2 cm right thyroid nodule has enlarged since 2011. Recommend further evaluation with thyroid ultrasound. If patient is clinically hyperthyroid, consider nuclear medicine thyroid uptake and scan. 4. Chronic cervical spine degenerative changes C4-C5 through C6-C7.   Electronically Signed   By: Lars Pinks M.D.   On: 10/09/2013 19:35    Microbiology: No results found for this or any previous visit (from the past 240 hour(s)).   Labs: Basic Metabolic Panel:  Recent Labs Lab 10/09/13 1310 10/09/13 2045 10/10/13 0540 10/11/13 0336 10/12/13 0600  NA 146  --  144 143 143  K 4.0  --  4.3 4.3 4.5  CL 109  --  109 109 107  CO2 26  --  23 23 23   GLUCOSE 99  --  172* 163* 155*  BUN 13  --  14 17 16   CREATININE 1.02 1.02 0.94 0.95 0.91  CALCIUM 9.2  --  9.1 8.6 8.9   Liver Function Tests:  Recent Labs Lab 10/09/13 1310 10/10/13 0540 10/11/13 0336 10/12/13 0600  AST 25 21 16 23   ALT 15 14 11 17   ALKPHOS 94 92 78 85  BILITOT 0.4 0.6 0.4 0.5  PROT 7.3 6.8 6.1 6.8  ALBUMIN 3.5 3.2* 2.9* 3.1*   No results found for this basename: LIPASE, AMYLASE,  in the last 168 hours No results found for this basename: AMMONIA,  in the last 168  hours CBC:  Recent Labs Lab 10/09/13 1310 10/09/13 2045 10/10/13 0540 10/11/13 0336 10/12/13 0600  WBC 4.5 3.8* 2.1* 8.1 6.6  NEUTROABS 3.6  --  1.7 7.5 6.2  HGB 11.5* 11.1* 11.5* 9.9* 11.1*  HCT 34.9* 34.3* 35.2* 30.3* 33.3*  MCV 90.4 90.5 91.2 89.1 89.3  PLT 181 168 174 163 158   Cardiac Enzymes: No results found for this basename: CKTOTAL, CKMB, CKMBINDEX, TROPONINI,  in the last 168 hours BNP: BNP (last 3 results) No results found for this basename: PROBNP,  in the last 8760 hours CBG: No results found for this basename: GLUCAP,  in the last 168 hours     Signed:  Dmani Mizer  [facility] 10/12/2013, 10:56 AM

## 2013-10-12 NOTE — Care Management Note (Signed)
    Page 1 of 2   10/12/2013     2:05:48 PM CARE MANAGEMENT NOTE 10/12/2013  Patient:  Erika Murphy, Erika Murphy   Account Number:  [id number]  Date Initiated:  10/12/2013  Documentation initiated by:  [facility]  Subjective/Objective Assessment:   adm: Active Problems:    Multiple sclerosis    Emesis    Multiple sclerosis exacerbation     Action/Plan:   discharge planning   Anticipated DC Date:  10/12/2013   Anticipated DC Plan:  [place]  CM consult      [facility] Choice  HOME HEALTH   Choice offered to / List presented to:  C-1 Patient   DME arranged  [facility]      DME agency  [place] arranged  [place].   Status of service:  Completed, signed off Medicare Important Message given?   (If response is "NO", the following Medicare IM given date fields will be blank) Date Medicare IM given:   Medicare IM given by:   Date Additional Medicare IM given:   Additional Medicare IM given by:    Discharge Disposition:  [place]  Per UR Regulation:    If discussed at Long Length of Stay Meetings, dates discussed:    Comments:  10/12/13 13:00 CM met with pt in room to offer choice of home health agency.  Pt chooses [facility] to render HHPT. Address and contact information verified with pt.  CM called DME rep, Jeneen Rinks to deliver Rol[place] to room prior to discharge.  Referral called to [facility], [name].  No other CM needs were communicated.  Mariane Masters, BSN, CM [phone].

## 2013-10-14 DIAGNOSIS — G35 Multiple sclerosis: Secondary | ICD-10-CM | POA: Diagnosis not present

## 2013-10-14 DIAGNOSIS — N319 Neuromuscular dysfunction of bladder, unspecified: Secondary | ICD-10-CM | POA: Diagnosis not present

## 2013-10-14 DIAGNOSIS — F419 Anxiety disorder, unspecified: Secondary | ICD-10-CM | POA: Diagnosis not present

## 2013-10-14 DIAGNOSIS — Z9181 History of falling: Secondary | ICD-10-CM | POA: Diagnosis not present

## 2013-10-14 DIAGNOSIS — K589 Irritable bowel syndrome without diarrhea: Secondary | ICD-10-CM | POA: Diagnosis not present

## 2013-10-14 DIAGNOSIS — Z85028 Personal history of other malignant neoplasm of stomach: Secondary | ICD-10-CM | POA: Diagnosis not present

## 2013-10-14 DIAGNOSIS — Z903 Acquired absence of stomach [part of]: Secondary | ICD-10-CM | POA: Diagnosis not present

## 2013-10-14 DIAGNOSIS — M8580 Other specified disorders of bone density and structure, unspecified site: Secondary | ICD-10-CM | POA: Diagnosis not present

## 2013-10-17 DIAGNOSIS — F419 Anxiety disorder, unspecified: Secondary | ICD-10-CM | POA: Diagnosis not present

## 2013-10-17 DIAGNOSIS — N319 Neuromuscular dysfunction of bladder, unspecified: Secondary | ICD-10-CM | POA: Diagnosis not present

## 2013-10-17 DIAGNOSIS — Z9181 History of falling: Secondary | ICD-10-CM | POA: Diagnosis not present

## 2013-10-17 DIAGNOSIS — K589 Irritable bowel syndrome without diarrhea: Secondary | ICD-10-CM | POA: Diagnosis not present

## 2013-10-17 DIAGNOSIS — M8580 Other specified disorders of bone density and structure, unspecified site: Secondary | ICD-10-CM | POA: Diagnosis not present

## 2013-10-17 DIAGNOSIS — G35 Multiple sclerosis: Secondary | ICD-10-CM | POA: Diagnosis not present

## 2013-10-20 ENCOUNTER — Ambulatory Visit: Payer: Medicare Other | Admitting: Neurology

## 2013-10-20 DIAGNOSIS — F419 Anxiety disorder, unspecified: Secondary | ICD-10-CM | POA: Diagnosis not present

## 2013-10-20 DIAGNOSIS — N319 Neuromuscular dysfunction of bladder, unspecified: Secondary | ICD-10-CM | POA: Diagnosis not present

## 2013-10-20 DIAGNOSIS — Z9181 History of falling: Secondary | ICD-10-CM | POA: Diagnosis not present

## 2013-10-20 DIAGNOSIS — G35 Multiple sclerosis: Secondary | ICD-10-CM | POA: Diagnosis not present

## 2013-10-20 DIAGNOSIS — K589 Irritable bowel syndrome without diarrhea: Secondary | ICD-10-CM | POA: Diagnosis not present

## 2013-10-20 DIAGNOSIS — M8580 Other specified disorders of bone density and structure, unspecified site: Secondary | ICD-10-CM | POA: Diagnosis not present

## 2013-10-21 ENCOUNTER — Encounter: Payer: Self-pay | Admitting: Neurology

## 2013-10-21 ENCOUNTER — Ambulatory Visit (INDEPENDENT_AMBULATORY_CARE_PROVIDER_SITE_OTHER): Payer: Medicare Other | Admitting: Neurology

## 2013-10-21 VITALS — BP 101/66 | HR 64 | Temp 98.3°F | Ht 64.0 in | Wt 105.0 lb

## 2013-10-21 DIAGNOSIS — G35 Multiple sclerosis: Secondary | ICD-10-CM

## 2013-10-21 NOTE — Progress Notes (Signed)
[facility]  PATIENT: Erika Murphy DOB: 07/07/1950  HISTORICAL  Erika Murphy is a 63 years old right-handed African American female, patient of Dr. Erling Murphy, followed up for multiple sclerosis, last clinical visit was Sep 2014  Her primary care physician is Dr. Tommie Ard Murphy  She was diagnosed with multiple sclerosis in 1998, presented with spastic gait disorder, was treated with Betaseron since 1998, which was stopped in 2011 due to abnormal liver functional test, her abnormal liver function test did recover back to normal after stopping Betaseron.  Her multiple sclerosis symptoms have been  fairly stable over years, there was no worsening of her symptoms without taking Betaseron and baclofen, she is only taking Lyrica 50 mg 3 times a day for neuropathic pain involving bilateral upper and lower extremities.  Since July 2014, she also had intermittent left shoulder radiating pain to her left lateral arm, left hand, sometimes woke her up from sleep, intermittent, there was no significant worsening of weakness.  She also had a history of left shoulder replacement in 2011,  She has a history of gastric adenocarcinoma, status post subtotal gastrectomy, partial omentectomy in March 20001, status post chemotherapy and radiation therapy.  Most recent MRI of the brain was in June 2013, small white matter hyperintensities are unchanged from 2010. No enhancing or restricted diffusion lesions are seen. These lesions are nonspecific and could be seen with multiple sclerosis or chronic microvascular ischemia most abnormality is seen in MRI of cervical spine, most recently in 2011,  MRI scan of the cervical spine in 2011 showed prominent spondylitic changes from C4-6 with mild left sided foramnial stenosis at C4-5, C5-6 and bilateral foraminal stenosis at C6-7. Illdefined spinal cord hyperintensities at C5-6 and T2-3 likely represent remote age demyelinating plaque  She lives alone, ambulate  with a cane, no bowel bladder incontinence.  UPDATE Sep 15th 2015: She is dong well, still lives alone in a house with couple steps, using cane to ambulate, she never drive. She is taking Lyrica 50 mg 3 times a day for her upper and lower extremity pain, which has been very helpful, taking Valium 5 mg as needed,  Over the years, her multiple sclerosis symptoms has been very stable, there is no worsening of her gait problem.  UPDATE Oct 21 2013; She was admitted to the hospital in October 8th 2015, complaining of worsening gait difficulty, worsening right arm, and right leg weakness, deep achy pain, this was preceded by vomitting, loose stool for few days, was treated with IV Solu-Medrol for 3 days, repeat MRI of the brain, and cervical spine showed no acute lesions.  She continued to ambulate with a cane, dragging her right leg, reported 80% improvement, receiving home physical therapy, which has been helpful, Tylenol has been helpful too   REVIEW OF SYSTEMS: Full 14 system review of systems performed and notable only for numbness, fatigue, ringing in ears, eye pain, cold intolerance, dizziness, headaches, numbness, weakness, achy muscles, walking difficulties  ALLERGIES: No Known Allergies  HOME MEDICATIONS: Outpatient Prescriptions Prior to Visit  Medication Sig Dispense Refill  . albuterol (PROVENTIL HFA;VENTOLIN HFA) 108 (90 BASE) MCG/ACT inhaler Inhale 2 puffs into the lungs every 6 (six) hours as needed for wheezing or shortness of breath.      . baclofen (LIORESAL) 10 MG tablet Take 1 tablet (10 mg total) by mouth 3 (three) times daily.  90 each  3  . diazepam (VALIUM) 5 MG tablet Take 5 mg by mouth as  needed for anxiety.       . lansoprazole (PREVACID) 30 MG capsule Take 30 mg by mouth daily at 12 noon.      . Multiple Vitamin (MULTIVITAMIN) tablet Take 1 tablet by mouth daily.      . pregabalin (LYRICA) 50 MG capsule Take 50 mg by mouth 3 (three) times daily.       No  facility-administered medications prior to visit.    PAST MEDICAL HISTORY: Past Medical History  Diagnosis Date  . MS (multiple sclerosis) dx'd ~ 1998  . Osteopenia   . IBS (irritable bowel syndrome)   . Neurogenic bladder   . Stomach cancer 2001  . Asthma   . History of blood transfusion 2001    "when I had my stomach cancer"  . GERD (gastroesophageal reflux disease)   . Thyroid nodule     right  . Hypothyroidism   . Arthritis     "right shoulder" (10/09/2013)  . Osteoporosis   . Fibromyalgia   . B12 deficiency anemia     PAST SURGICAL HISTORY: Past Surgical History  Procedure Laterality Date  . Partial gastrectomy  2001  . Cesarean section  1975; 1984  . Tonsillectomy  ~ 1959  . Joint replacement    . Omentectomy  2001    partial  . Total shoulder arthroplasty Left 2011  . Biopsy thyroid Right 1990's    FAMILY HISTORY: Family History  Problem Relation Age of Onset  . Heart disease Mother   . Diabetes Father     SOCIAL HISTORY:  History   Social History  . Marital Status: Divorced    Spouse Name: N/A    Number of Children: 3   . Years of Education: N/A   Occupational History  . DISABLED    Social History Main Topics  . Smoking status: Never Smoker   . Smokeless tobacco: Never Used  . Alcohol Use: No  . Drug Use: No  . Sexual Activity: No   Other Topics Concern  . Not on file   Social History Narrative   Patient is retired.    Patient is married with 3 children.   Patient drinks caffeine daily.      PHYSICAL EXAM    Filed Vitals:   10/21/13 1150  BP: 101/66  Pulse: 64  Temp: 98.3 F (36.8 C)  TempSrc: Oral  Height: 5\' 4"  (1.626 m)  Weight: 105 lb (47.628 kg)     Body mass index is 18.01 kg/(m^2).   Generalized: In no acute distress  Neck: Supple, no carotid bruits   Cardiac: Regular rate rhythm  Pulmonary: Clear to auscultation bilaterally  Musculoskeletal: No deformity  Neurological examination  Mentation: Alert  oriented to time, place, history taking, and causual conversation,  Very thin  Cranial nerve II-XII: Pupils were equal round reactive to light extraocular movements were full, visual field were full on confrontational test. facial sensation and strength were normal. hearing was intact to finger rubbing bilaterally. Uvula tongue midline.  head turning and shoulder shrug and were normal and symmetric.Tongue protrusion into cheek strength was normal.  Motor: She has right upper extremity fixation on rapid rotating movement, she has moderate spasticity of bilateral lower extremity. bilateral lower extremity (right/left), hip flexion 4/4+, knee flexion 4/5, knee extension 4/5, ankle dorsiflexion 3/5 ankle plantar flexion 4+/5,    Sensory: Intact to fine touch  Coordination: Normal finger to nose, heel-to-shin bilaterally there was no truncal ataxia  Gait: Needed assistance to get up from  seated position, spastic, stiff, cautious gait, dragging her right leg Romberg signs: Negative  Deep tendon reflexes: Brachioradialis 2/2, biceps 2/2, triceps 2/2, patellar 3/3, Achilles 2/2,   DIAGNOSTIC DATA (LABS, IMAGING, TESTING) - I reviewed patient records, labs, notes, testing and imaging myself where available.  Lab Results  Component Value Date   WBC 6.6 10/12/2013   HGB 11.1* 10/12/2013   HCT 33.3* 10/12/2013   MCV 89.3 10/12/2013   PLT 158 10/12/2013      Component Value Date/Time   NA 143 10/12/2013 0600   K 4.5 10/12/2013 0600   CL 107 10/12/2013 0600   CO2 23 10/12/2013 0600   GLUCOSE 155* 10/12/2013 0600   BUN 16 10/12/2013 0600   CREATININE 0.91 10/12/2013 0600   CREATININE 1.00 04/11/2013 1040   CALCIUM 8.9 10/12/2013 0600   PROT 6.8 10/12/2013 0600   ALBUMIN 3.1* 10/12/2013 0600   AST 23 10/12/2013 0600   ALT 17 10/12/2013 0600   ALKPHOS 85 10/12/2013 0600   BILITOT 0.5 10/12/2013 0600   GFRNONAA 65* 10/12/2013 0600   GFRAA 75* 10/12/2013 0600   Lab Results  Component Value  Date   VITAMINB12 648 09/10/2012   Lab Results  Component Value Date   TSH 0.314* 10/09/2013     ASSESSMENT AND PLAN   63 years old right-handed African American female, with past medical history of relapsing remitting multiple sclerosis, was treated with Betaseron from 1998 to 2011, developed abnormal liver function in the past, which has recovered after stopping Betaseron, the abnormalities are mainly at the cervical spine, detailed above, minimal findings on MRI of the brain, symptoms has been stable for many years. She also had a history of gastric adenocarcinoma, status post gastrectomy, followed by chemotherapy and radiation therapy in 2001,   Her MS symptoms has been fairly stable, moderate bilateral lower extremity spasticity, moderate gait difficulty, Will not start any long-term disease modifying medication at this point, her recurrent episode of worsening gait difficulty, right side difficulty, likely due to decompensation,  I have discussed with patient for Baclofen ER trial.  She will return for research screening visit    Marcial Pacas, M.D. Ph.D.  [facility] [address], [place], [place] [address] [phone]

## 2013-10-22 DIAGNOSIS — G35 Multiple sclerosis: Secondary | ICD-10-CM | POA: Diagnosis not present

## 2013-10-22 DIAGNOSIS — F419 Anxiety disorder, unspecified: Secondary | ICD-10-CM | POA: Diagnosis not present

## 2013-10-22 DIAGNOSIS — Z9181 History of falling: Secondary | ICD-10-CM | POA: Diagnosis not present

## 2013-10-22 DIAGNOSIS — M8580 Other specified disorders of bone density and structure, unspecified site: Secondary | ICD-10-CM | POA: Diagnosis not present

## 2013-10-22 DIAGNOSIS — K589 Irritable bowel syndrome without diarrhea: Secondary | ICD-10-CM | POA: Diagnosis not present

## 2013-10-22 DIAGNOSIS — N319 Neuromuscular dysfunction of bladder, unspecified: Secondary | ICD-10-CM | POA: Diagnosis not present

## 2013-10-27 DIAGNOSIS — N319 Neuromuscular dysfunction of bladder, unspecified: Secondary | ICD-10-CM | POA: Diagnosis not present

## 2013-10-27 DIAGNOSIS — Z9181 History of falling: Secondary | ICD-10-CM | POA: Diagnosis not present

## 2013-10-27 DIAGNOSIS — K589 Irritable bowel syndrome without diarrhea: Secondary | ICD-10-CM | POA: Diagnosis not present

## 2013-10-27 DIAGNOSIS — G35 Multiple sclerosis: Secondary | ICD-10-CM | POA: Diagnosis not present

## 2013-10-27 DIAGNOSIS — M8580 Other specified disorders of bone density and structure, unspecified site: Secondary | ICD-10-CM | POA: Diagnosis not present

## 2013-10-27 DIAGNOSIS — F419 Anxiety disorder, unspecified: Secondary | ICD-10-CM | POA: Diagnosis not present

## 2013-10-28 ENCOUNTER — Ambulatory Visit (INDEPENDENT_AMBULATORY_CARE_PROVIDER_SITE_OTHER): Payer: Medicare Other | Admitting: Internal Medicine

## 2013-10-28 ENCOUNTER — Encounter: Payer: Self-pay | Admitting: Internal Medicine

## 2013-10-28 VITALS — HR 74

## 2013-10-28 DIAGNOSIS — E538 Deficiency of other specified B group vitamins: Secondary | ICD-10-CM

## 2013-10-28 DIAGNOSIS — Z23 Encounter for immunization: Secondary | ICD-10-CM

## 2013-10-28 DIAGNOSIS — G35 Multiple sclerosis: Secondary | ICD-10-CM

## 2013-10-28 MED ORDER — PNEUMOCOCCAL 13-VAL CONJ VACC IM SUSP: 0.5000 mL | INTRAMUSCULAR | Status: DC

## 2013-10-28 MED ORDER — CYANOCOBALAMIN 1000 MCG/ML IJ SOLN
1000.0000 ug | Freq: Once | INTRAMUSCULAR | Status: AC
  Administered 2013-10-28: 1000 ug via INTRAMUSCULAR

## 2013-10-31 DIAGNOSIS — F419 Anxiety disorder, unspecified: Secondary | ICD-10-CM | POA: Diagnosis not present

## 2013-10-31 DIAGNOSIS — K589 Irritable bowel syndrome without diarrhea: Secondary | ICD-10-CM | POA: Diagnosis not present

## 2013-10-31 DIAGNOSIS — N319 Neuromuscular dysfunction of bladder, unspecified: Secondary | ICD-10-CM | POA: Diagnosis not present

## 2013-10-31 DIAGNOSIS — Z9181 History of falling: Secondary | ICD-10-CM | POA: Diagnosis not present

## 2013-10-31 DIAGNOSIS — M8580 Other specified disorders of bone density and structure, unspecified site: Secondary | ICD-10-CM | POA: Diagnosis not present

## 2013-10-31 DIAGNOSIS — G35 Multiple sclerosis: Secondary | ICD-10-CM | POA: Diagnosis not present

## 2013-11-04 ENCOUNTER — Telehealth: Payer: Self-pay | Admitting: Neurology

## 2013-11-04 NOTE — Telephone Encounter (Signed)
Patient was calling to confirm appointment on Thursday November 12th, 2015 at 10:00hrs for Screening Visit for the [employer].

## 2013-11-13 ENCOUNTER — Ambulatory Visit (INDEPENDENT_AMBULATORY_CARE_PROVIDER_SITE_OTHER): Payer: Self-pay | Admitting: Neurology

## 2013-11-13 DIAGNOSIS — G35 Multiple sclerosis: Secondary | ICD-10-CM

## 2013-11-13 NOTE — Progress Notes (Signed)
[facility]  PATIENT: Erika Murphy DOB: [birth year]  HISTORICAL  Irish is a 63 years old right-handed African American female, patient of Dr. Erling Cruz, followed up for multiple sclerosis, last clinical visit was Sep 2014  Her primary care physician is Dr. Tommie Ard Baxley  She was diagnosed with multiple sclerosis in 1998, presented with spastic gait disorder, was treated with Betaseron since 1998, which was stopped in 2011 due to abnormal liver functional test, her abnormal liver function test did recover back to normal after stopping Betaseron.  Her multiple sclerosis symptoms have been  fairly stable over years, there was no worsening of her symptoms without taking Betaseron and baclofen, she is only taking Lyrica 50 mg 3 times a day for neuropathic pain involving bilateral upper and lower extremities.  Since July 2014, she also had intermittent left shoulder radiating pain to her left lateral arm, left hand, sometimes woke her up from sleep, intermittent, there was no significant worsening of weakness.  She also had a history of left shoulder replacement in 2011,  She has a history of gastric adenocarcinoma, status post subtotal gastrectomy, partial omentectomy in March 20001, status post chemotherapy and radiation therapy.  Most recent MRI of the brain was in June 2013, small white matter hyperintensities are unchanged from 2010. No enhancing or restricted diffusion lesions are seen. These lesions are nonspecific and could be seen with multiple sclerosis or chronic microvascular ischemia most abnormality is seen in MRI of cervical spine, most recently in 2011,  MRI scan of the cervical spine in 2011 showed prominent spondylitic changes from C4-6 with mild left sided foramnial stenosis at C4-5, C5-6 and bilateral foraminal stenosis at C6-7. Illdefined spinal cord hyperintensities at C5-6 and T2-3 likely represent remote age demyelinating plaque  She lives alone, ambulate  with a cane, no bowel bladder incontinence.  UPDATE Sep 15th 2015: She is dong well, still lives alone in a house with couple steps, using cane to ambulate, she never drive. She is taking Lyrica 50 mg 3 times a day for her upper and lower extremity pain, which has been very helpful, taking Valium 5 mg as needed,  Over the years, her multiple sclerosis symptoms has been very stable, there is no worsening of her gait problem.  UPDATE Oct 21 2013; She was admitted to the hospital in October 8th 2015, complaining of worsening gait difficulty, worsening right arm, and right leg weakness, deep achy pain, this was preceded by vomitting, loose stool for few days, was treated with IV Solu-Medrol for 3 days, repeat MRI of the brain, and cervical spine showed no acute lesions.  She continued to ambulate with a cane, dragging her right leg, reported 80% improvement, receiving home physical therapy, which has been helpful, Tylenol has been helpful too  UPDATE Nov 13 2013: Screening visit for [facility], she is on baclofen 10mg  tid since Oct 7th 2015, tolerating it well.   REVIEW OF SYSTEMS: Full 14 system review of systems performed and notable only for numbness, fatigue, ringing in ears, eye pain, cold intolerance, dizziness, headaches, numbness, weakness, achy muscles, walking difficulties  ALLERGIES: No Known Allergies  HOME MEDICATIONS: Outpatient Prescriptions Prior to Visit  Medication Sig Dispense Refill  . albuterol (PROVENTIL HFA;VENTOLIN HFA) 108 (90 BASE) MCG/ACT inhaler Inhale 2 puffs into the lungs every 6 (six) hours as needed for wheezing or shortness of breath.    . baclofen (LIORESAL) 10 MG tablet Take 1 tablet (10 mg total) by mouth 3 (three)  times daily. 90 each 3  . diazepam (VALIUM) 5 MG tablet Take 5 mg by mouth as needed for anxiety.     . lansoprazole (PREVACID) 30 MG capsule Take 30 mg by mouth daily at 12 noon.    . Multiple Vitamin (MULTIVITAMIN) tablet Take 1 tablet by mouth  daily.    . pregabalin (LYRICA) 50 MG capsule Take 50 mg by mouth 3 (three) times daily.     Facility-Administered Medications Prior to Visit  Medication Dose Route Frequency Provider Last Rate Last Dose  . pneumococcal 13-valent conjugate vaccine (PREVNAR 13) injection 0.5 mL  0.5 mL Intramuscular Tomorrow-1000 Elby Showers, MD        PAST MEDICAL HISTORY: Past Medical History  Diagnosis Date  . MS (multiple sclerosis) dx'd ~ 1998  . Osteopenia   . IBS (irritable bowel syndrome)   . Neurogenic bladder   . Stomach cancer 2001  . Asthma   . History of blood transfusion 2001    "when I had my stomach cancer"  . GERD (gastroesophageal reflux disease)   . Thyroid nodule     right  . Hypothyroidism   . Arthritis     "right shoulder" (10/09/2013)  . Osteoporosis   . Fibromyalgia   . B12 deficiency anemia     PAST SURGICAL HISTORY: Past Surgical History  Procedure Laterality Date  . Partial gastrectomy  2001  . Cesarean section  1975; 1984  . Tonsillectomy  ~ 1959  . Joint replacement    . Omentectomy  2001    partial  . Total shoulder arthroplasty Left 2011  . Biopsy thyroid Right 1990's    FAMILY HISTORY: Family History  Problem Relation Age of Onset  . Heart disease Mother   . Diabetes Father     SOCIAL HISTORY:  History   Social History  . Marital Status: Divorced    Spouse Name: N/A    Number of Children: 3   . Years of Education: N/A   Occupational History  . DISABLED    Social History Main Topics  . Smoking status: Never Smoker   . Smokeless tobacco: Never Used  . Alcohol Use: No  . Drug Use: No  . Sexual Activity: No   Other Topics Concern  . Not on file   Social History Narrative   Patient is retired.    Patient is married with 3 children.   Patient drinks caffeine daily.      PHYSICAL EXAM    There were no vitals filed for this visit.   There is no weight on file to calculate BMI.   Generalized: In no acute distress  Neck:  Supple, no carotid bruits   Cardiac: Regular rate rhythm  Pulmonary: Clear to auscultation bilaterally  Musculoskeletal: No deformity  Neurological examination  Mentation: Alert oriented to time, place, history taking, and causual conversation,  Very thin  Cranial nerve II-XII: Pupils were equal round reactive to light extraocular movements were full, visual field were full on confrontational test. facial sensation and strength were normal. hearing was intact to finger rubbing bilaterally. Uvula tongue midline.  head turning and shoulder shrug and were normal and symmetric.Tongue protrusion into cheek strength was normal.  Motor: She has right upper extremity fixation on rapid rotating movement, she has moderate spasticity of bilateral lower extremity. bilateral lower extremity (right/left), hip flexion 4/4+, knee flexion 4/5, knee extension 4/5, ankle dorsiflexion 3/5 ankle plantar flexion 4+/5,    Sensory: Intact to fine touch  Coordination:  Normal finger to nose, heel-to-shin bilaterally there was no truncal ataxia  Gait: Needed assistance to get up from seated position, spastic, stiff, cautious gait, dragging her right leg Romberg signs: Negative  Deep tendon reflexes: Brachioradialis 2/2, biceps 2/2, triceps 2/2, patellar 3/3, Achilles 2/2,   DIAGNOSTIC DATA (LABS, IMAGING, TESTING) - I reviewed patient records, labs, notes, testing and imaging myself where available.  Lab Results  Component Value Date   WBC 6.6 10/12/2013   HGB 11.1* 10/12/2013   HCT 33.3* 10/12/2013   MCV 89.3 10/12/2013   PLT 158 10/12/2013      Component Value Date/Time   NA 143 10/12/2013 0600   K 4.5 10/12/2013 0600   CL 107 10/12/2013 0600   CO2 23 10/12/2013 0600   GLUCOSE 155* 10/12/2013 0600   BUN 16 10/12/2013 0600   CREATININE 0.91 10/12/2013 0600   CREATININE 1.00 04/11/2013 1040   CALCIUM 8.9 10/12/2013 0600   PROT 6.8 10/12/2013 0600   ALBUMIN 3.1* 10/12/2013 0600   AST 23  10/12/2013 0600   ALT 17 10/12/2013 0600   ALKPHOS 85 10/12/2013 0600   BILITOT 0.5 10/12/2013 0600   GFRNONAA 65* 10/12/2013 0600   GFRAA 75* 10/12/2013 0600   Lab Results  Component Value Date   VITAMINB12 648 09/10/2012   Lab Results  Component Value Date   TSH 0.314* 10/09/2013     ASSESSMENT AND PLAN   63 years old right-handed African American female, with past medical history of relapsing remitting multiple sclerosis, was treated with Betaseron from 1998 to 2011, developed abnormal liver function in the past, which has recovered after stopping Betaseron, the abnormalities are mainly at the cervical spine, detailed above, minimal findings on MRI of the brain, symptoms has been stable for many years. She also had a history of gastric adenocarcinoma, status post gastrectomy, followed by chemotherapy and radiation therapy in 2001,   Her MS symptoms has been fairly stable, moderate bilateral lower extremity spasticity, moderate gait difficulty, Will not start any long-term disease modifying medication at this point, her recurrent episode of worsening gait difficulty, right side difficulty, likely due to decompensation,  Screening visit for [employer] research trial    Marcial Pacas, M.D. Ph.D.  [facility] [address], [place], [place] [address] [phone]

## 2013-11-20 ENCOUNTER — Other Ambulatory Visit (HOSPITAL_BASED_OUTPATIENT_CLINIC_OR_DEPARTMENT_OTHER): Payer: Medicare Other | Admitting: Lab

## 2013-11-20 ENCOUNTER — Encounter: Payer: Self-pay | Admitting: Hematology & Oncology

## 2013-11-20 ENCOUNTER — Ambulatory Visit (HOSPITAL_BASED_OUTPATIENT_CLINIC_OR_DEPARTMENT_OTHER): Payer: Medicare Other | Admitting: Hematology & Oncology

## 2013-11-20 VITALS — BP 145/60 | HR 66 | Temp 98.1°F | Resp 14 | Ht 64.0 in | Wt 102.0 lb

## 2013-11-20 DIAGNOSIS — G35 Multiple sclerosis: Secondary | ICD-10-CM

## 2013-11-20 DIAGNOSIS — D509 Iron deficiency anemia, unspecified: Secondary | ICD-10-CM

## 2013-11-20 DIAGNOSIS — C169 Malignant neoplasm of stomach, unspecified: Secondary | ICD-10-CM | POA: Diagnosis not present

## 2013-11-20 DIAGNOSIS — Z85028 Personal history of other malignant neoplasm of stomach: Secondary | ICD-10-CM

## 2013-11-20 DIAGNOSIS — D500 Iron deficiency anemia secondary to blood loss (chronic): Secondary | ICD-10-CM

## 2013-11-20 LAB — COMPREHENSIVE METABOLIC PANEL
ALK PHOS: 104 U/L (ref 39–117)
ALT: 16 U/L (ref 0–35)
AST: 31 U/L (ref 0–37)
Albumin: 4.1 g/dL (ref 3.5–5.2)
BUN: 13 mg/dL (ref 6–23)
CALCIUM: 9.5 mg/dL (ref 8.4–10.5)
CHLORIDE: 106 meq/L (ref 96–112)
CO2: 27 meq/L (ref 19–32)
Creatinine, Ser: 1.08 mg/dL (ref 0.50–1.10)
GLUCOSE: 76 mg/dL (ref 70–99)
POTASSIUM: 4.1 meq/L (ref 3.5–5.3)
SODIUM: 143 meq/L (ref 135–145)
Total Bilirubin: 0.6 mg/dL (ref 0.2–1.2)
Total Protein: 7.2 g/dL (ref 6.0–8.3)

## 2013-11-20 LAB — CBC WITH DIFFERENTIAL (CANCER CENTER ONLY)
BASO#: 0 10*3/uL (ref 0.0–0.2)
BASO%: 0.3 % (ref 0.0–2.0)
EOS ABS: 0.1 10*3/uL (ref 0.0–0.5)
EOS%: 2.4 % (ref 0.0–7.0)
HCT: 37.1 % (ref 34.8–46.6)
HGB: 12.2 g/dL (ref 11.6–15.9)
LYMPH#: 1.1 10*3/uL (ref 0.9–3.3)
LYMPH%: 28.6 % (ref 14.0–48.0)
MCH: 30.7 pg (ref 26.0–34.0)
MCHC: 32.9 g/dL (ref 32.0–36.0)
MCV: 93 fL (ref 81–101)
MONO#: 0.5 10*3/uL (ref 0.1–0.9)
MONO%: 13.7 % — ABNORMAL HIGH (ref 0.0–13.0)
NEUT%: 55 % (ref 39.6–80.0)
NEUTROS ABS: 2 10*3/uL (ref 1.5–6.5)
PLATELETS: 267 10*3/uL (ref 145–400)
RBC: 3.98 10*6/uL (ref 3.70–5.32)
RDW: 12.1 % (ref 11.1–15.7)
WBC: 3.7 10*3/uL — AB (ref 3.9–10.0)

## 2013-11-20 LAB — RETICULOCYTES (CHCC)
ABS Retic: 40.6 10*3/uL (ref 19.0–186.0)
RBC.: 4.06 MIL/uL (ref 3.87–5.11)
RETIC CT PCT: 1 % (ref 0.4–2.3)

## 2013-11-20 LAB — IRON AND TIBC CHCC
%SAT: 23 % (ref 21–57)
IRON: 83 ug/dL (ref 41–142)
TIBC: 355 ug/dL (ref 236–444)
UIBC: 272 ug/dL (ref 120–384)

## 2013-11-20 LAB — FERRITIN CHCC: Ferritin: 80 ng/ml (ref 9–269)

## 2013-11-20 NOTE — Progress Notes (Signed)
Hematology and Oncology Follow Up Visit  Erika Murphy [id number] [birth year] 63 y.o. 11/20/2013   Principle Diagnosis:   Stage II (T3N0M0) adenocarcinoma of the stomach-remission Recurrent iron deficiency anemia  Multiple sclerosis Current Therapy:    IV iron as indicated     Interim History:  Ms.  Erika Murphy is back for follow-up. We last saw her back in May.  Unfortunately, she recently was hospitalized. This was in early October. She had a flareup of the multiple sclerosis. She has not had a problem with this for quite a while. She really has not been on any medications for this. She was treated with high-dose IV steroids for 3 days.   She's had no abdominal pain. Her appetite is doing a little better. Her weight has been stable.  She's had no problem with bleeding.  We've not had to give her iron for a while. I think the last time we gave her iron was back in 2013.  Medications: Current outpatient prescriptions: albuterol (PROVENTIL HFA;VENTOLIN HFA) 108 (90 BASE) MCG/ACT inhaler, Inhale 2 puffs into the lungs every 6 (six) hours as needed for wheezing or shortness of breath., Disp: , Rfl: ;  baclofen (LIORESAL) 10 MG tablet, Take 1 tablet (10 mg total) by mouth 3 (three) times daily., Disp: 90 each, Rfl: 3;  diazepam (VALIUM) 5 MG tablet, Take 5 mg by mouth as needed for anxiety. , Disp: , Rfl:  lansoprazole (PREVACID) 30 MG capsule, Take 30 mg by mouth daily at 12 noon., Disp: , Rfl: ;  Multiple Vitamin (MULTIVITAMIN) tablet, Take 1 tablet by mouth daily., Disp: , Rfl: ;  pregabalin (LYRICA) 50 MG capsule, Take 50 mg by mouth 3 (three) times daily., Disp: , Rfl:   Allergies: No Known Allergies  Past Medical History, Surgical history, Social history, and Family History were reviewed and updated.  Review of Systems: As above  Physical Exam:  height is 5\' 4"  (1.626 m) and weight is 102 lb (46.267 kg). Her oral temperature is 98.1 F (36.7 C). Her blood pressure is 145/60 and her  pulse is 66. Her respiration is 14.   Thin African female. Lungs are clear. Cardiac exam regular rate rhythm. Abdomen soft. Good bowel sounds. There is no fluid wave. She'll well-healed laparotomy scar. There is no palpable liver or spleen tip. Back exam no tenderness over the spine ribs or hips. Extremities shows a symmetric most likely bilaterally. She has 4+/5 strength in her extremities. Skin exam no rashes.  Lab Results  Component Value Date   WBC 3.7* 11/20/2013   HGB 12.2 11/20/2013   HCT 37.1 11/20/2013   MCV 93 11/20/2013   PLT 267 11/20/2013     Chemistry      Component Value Date/Time   NA 143 10/12/2013 0600   K 4.5 10/12/2013 0600   CL 107 10/12/2013 0600   CO2 23 10/12/2013 0600   BUN 16 10/12/2013 0600   CREATININE 0.91 10/12/2013 0600   CREATININE 1.00 04/11/2013 1040      Component Value Date/Time   CALCIUM 8.9 10/12/2013 0600   ALKPHOS 85 10/12/2013 0600   AST 23 10/12/2013 0600   ALT 17 10/12/2013 0600   BILITOT 0.5 10/12/2013 0600         Impression and Plan: [name] is a 63 year old African [place] female. She had a history of stage II stomach cancer. She underwent resection. She had adjuvant therapy. This is now 15 years in the past. She is cured of the stomach  cancer.  We will go ahead and plan for another followup in 6 months.  We will see what her iron studies show. I would be surprised if she needed any iron.  Hopefully, the multiple sclerosis will not be a problem for her.   [name], MD 11/19/201512:57 PM

## 2013-11-21 ENCOUNTER — Telehealth: Payer: Self-pay | Admitting: *Deleted

## 2013-11-21 ENCOUNTER — Ambulatory Visit (INDEPENDENT_AMBULATORY_CARE_PROVIDER_SITE_OTHER): Payer: Self-pay | Admitting: Neurology

## 2013-11-21 DIAGNOSIS — G35 Multiple sclerosis: Secondary | ICD-10-CM

## 2013-11-21 NOTE — Telephone Encounter (Signed)
-----   Message from [name], MD sent at 11/20/2013  5:43 PM EST ----- Please call and let her know that the iron level is good. Laurey Arrow

## 2013-11-21 NOTE — Progress Notes (Signed)
[facility]  PATIENT: Erika Murphy DOB: [birth year]  HISTORICAL  Erika Murphy is a 63 years old right-handed African American female, patient of Dr. Erling Cruz, followed up for multiple sclerosis, last clinical visit was Sep 2014  Her primary care physician is Dr. Tommie Ard Baxley  She was diagnosed with multiple sclerosis in 1998, presented with spastic gait disorder, was treated with Betaseron since 1998, which was stopped in 2011 due to abnormal liver functional test, her abnormal liver function test did recover back to normal after stopping Betaseron.  Her multiple sclerosis symptoms have been  fairly stable over years, there was no worsening of her symptoms without taking Betaseron and baclofen, she is only taking Lyrica 50 mg 3 times a day for neuropathic pain involving bilateral upper and lower extremities.  Since July 2014, she also had intermittent left shoulder radiating pain to her left lateral arm, left hand, sometimes woke her up from sleep, intermittent, there was no significant worsening of weakness.  She also had a history of left shoulder replacement in 2011,  She has a history of gastric adenocarcinoma, status post subtotal gastrectomy, partial omentectomy in March 20001, status post chemotherapy and radiation therapy.  Most recent MRI of the brain was in June 2013, small white matter hyperintensities are unchanged from 2010. No enhancing or restricted diffusion lesions are seen. These lesions are nonspecific and could be seen with multiple sclerosis or chronic microvascular ischemia most abnormality is seen in MRI of cervical spine, most recently in 2011,  MRI scan of the cervical spine in 2011 showed prominent spondylitic changes from C4-6 with mild left sided foramnial stenosis at C4-5, C5-6 and bilateral foraminal stenosis at C6-7. Illdefined spinal cord hyperintensities at C5-6 and T2-3 likely represent remote age demyelinating plaque  She lives alone, ambulate  with a cane, no bowel bladder incontinence.  UPDATE Sep 15th 2015: She is dong well, still lives alone in a house with couple steps, using cane to ambulate, she never drive. She is taking Lyrica 50 mg 3 times a day for her upper and lower extremity pain, which has been very helpful, taking Valium 5 mg as needed,  Over the years, her multiple sclerosis symptoms has been very stable, there is no worsening of her gait problem.  UPDATE Oct 21 2013; She was admitted to the hospital in October 8th 2015, complaining of worsening gait difficulty, worsening right arm, and right leg weakness, deep achy pain, this was preceded by vomitting, loose stool for few days, was treated with IV Solu-Medrol for 3 days, repeat MRI of the brain, and cervical spine showed no acute lesions.  She continued to ambulate with a cane, dragging her right leg, reported 80% improvement, receiving home physical therapy, which has been helpful, Tylenol has been helpful too  UPDATE Nov 13 2013: Screening visit for [facility], she is on baclofen 10mg  tid since Oct 7th 2015, tolerating it well.  UPDATE Nov 21 2013: Came in for [place] visit one, dispense Baclofen ER 30mg  qhs. She is doing well   REVIEW OF SYSTEMS: Full 14 system review of systems performed and notable only for numbness, fatigue, ringing in ears, eye pain, cold intolerance, dizziness, headaches, numbness, weakness, achy muscles, walking difficulties  ALLERGIES: No Known Allergies  HOME MEDICATIONS: Outpatient Prescriptions Prior to Visit  Medication Sig Dispense Refill  . albuterol (PROVENTIL HFA;VENTOLIN HFA) 108 (90 BASE) MCG/ACT inhaler Inhale 2 puffs into the lungs every 6 (six) hours as needed for wheezing or shortness of  breath.    . baclofen (LIORESAL) 10 MG tablet Take 1 tablet (10 mg total) by mouth 3 (three) times daily. 90 each 3  . diazepam (VALIUM) 5 MG tablet Take 5 mg by mouth as needed for anxiety.     . lansoprazole (PREVACID) 30 MG capsule  Take 30 mg by mouth daily at 12 noon.    . Multiple Vitamin (MULTIVITAMIN) tablet Take 1 tablet by mouth daily.    . pregabalin (LYRICA) 50 MG capsule Take 50 mg by mouth 3 (three) times daily.     No facility-administered medications prior to visit.    PAST MEDICAL HISTORY: Past Medical History  Diagnosis Date  . MS (multiple sclerosis) dx'd ~ 1998  . Osteopenia   . IBS (irritable bowel syndrome)   . Neurogenic bladder   . Stomach cancer 2001  . Asthma   . History of blood transfusion 2001    "when I had my stomach cancer"  . GERD (gastroesophageal reflux disease)   . Thyroid nodule     right  . Hypothyroidism   . Arthritis     "right shoulder" (10/09/2013)  . Osteoporosis   . Fibromyalgia   . B12 deficiency anemia     PAST SURGICAL HISTORY: Past Surgical History  Procedure Laterality Date  . Partial gastrectomy  2001  . Cesarean section  1975; 1984  . Tonsillectomy  ~ 1959  . Joint replacement    . Omentectomy  2001    partial  . Total shoulder arthroplasty Left 2011  . Biopsy thyroid Right 1990's    FAMILY HISTORY: Family History  Problem Relation Age of Onset  . Heart disease Mother   . Diabetes Father     SOCIAL HISTORY:  History   Social History  . Marital Status: Divorced    Spouse Name: N/A    Number of Children: 3   . Years of Education: N/A   Occupational History  . DISABLED    Social History Main Topics  . Smoking status: Never Smoker   . Smokeless tobacco: Never Used     Comment: NEVER USED TOBACCO  . Alcohol Use: No  . Drug Use: No  . Sexual Activity: No   Other Topics Concern  . Not on file   Social History Narrative   Patient is retired.    Patient is married with 3 children.   Patient drinks caffeine daily.      PHYSICAL EXAM    There were no vitals filed for this visit.   There is no weight on file to calculate BMI.   Generalized: In no acute distress  Neck: Supple, no carotid bruits   Cardiac: Regular rate  rhythm  Pulmonary: Clear to auscultation bilaterally  Musculoskeletal: No deformity  Neurological examination  Mentation: Alert oriented to time, place, history taking, and causual conversation,  Very thin  Cranial nerve II-XII: Pupils were equal round reactive to light extraocular movements were full, visual field were full on confrontational test. facial sensation and strength were normal. hearing was intact to finger rubbing bilaterally. Uvula tongue midline.  head turning and shoulder shrug and were normal and symmetric.Tongue protrusion into cheek strength was normal.  Motor: She has right upper extremity fixation on rapid rotating movement, she has moderate spasticity of bilateral lower extremity. bilateral lower extremity (right/left), hip flexion 4/4+, knee flexion 4/5, knee extension 4/5, ankle dorsiflexion 3/5 ankle plantar flexion 4+/5,    Sensory: Intact to fine touch  Coordination: Normal finger to nose, heel-to-shin  bilaterally there was no truncal ataxia  Gait: Needed assistance to get up from seated position, spastic, stiff, cautious gait, dragging her right leg Romberg signs: Negative  Deep tendon reflexes: Brachioradialis 2/2, biceps 2/2, triceps 2/2, patellar 3/3, Achilles 2/2,   DIAGNOSTIC DATA (LABS, IMAGING, TESTING) - I reviewed patient records, labs, notes, testing and imaging myself where available.  Lab Results  Component Value Date   WBC 3.7* 11/20/2013   HGB 12.2 11/20/2013   HCT 37.1 11/20/2013   MCV 93 11/20/2013   PLT 267 11/20/2013      Component Value Date/Time   NA 143 11/20/2013 1116   K 4.1 11/20/2013 1116   CL 106 11/20/2013 1116   CO2 27 11/20/2013 1116   GLUCOSE 76 11/20/2013 1116   BUN 13 11/20/2013 1116   CREATININE 1.08 11/20/2013 1116   CREATININE 1.00 04/11/2013 1040   CALCIUM 9.5 11/20/2013 1116   PROT 7.2 11/20/2013 1116   ALBUMIN 4.1 11/20/2013 1116   AST 31 11/20/2013 1116   ALT 16 11/20/2013 1116   ALKPHOS 104  11/20/2013 1116   BILITOT 0.6 11/20/2013 1116   GFRNONAA 65* 10/12/2013 0600   GFRAA 75* 10/12/2013 0600   Lab Results  Component Value Date   VITAMINB12 648 09/10/2012   Lab Results  Component Value Date   TSH 0.314* 10/09/2013     ASSESSMENT AND PLAN   64 years old right-handed African American female, with past medical history of relapsing remitting multiple sclerosis, was treated with Betaseron from 1998 to 2011, developed abnormal liver function in the past, which has recovered after stopping Betaseron, the abnormalities are mainly at the cervical spine, detailed above, minimal findings on MRI of the brain, symptoms has been stable for many years. She also had a history of gastric adenocarcinoma, status post gastrectomy, followed by chemotherapy and radiation therapy in 2001,   Has baseline moderate bilateral lower extremity spasticity, moderate gait difficulty, Will not start any long-term disease modifying medication at this point, her recurrent episode of worsening gait difficulty, right side difficulty, likely due to decompensation,  Dispense Baclofen ER 30mg  follow up according to protocol    Marcial Pacas, M.D. Ph.D.  [facility] [address] [place], [place] [address] [phone]

## 2013-11-25 ENCOUNTER — Ambulatory Visit: Payer: Medicare Other | Admitting: Internal Medicine

## 2013-12-02 ENCOUNTER — Telehealth: Payer: Self-pay | Admitting: Neurology

## 2013-12-02 ENCOUNTER — Ambulatory Visit
Admission: RE | Admit: 2013-12-02 | Discharge: 2013-12-02 | Disposition: A | Discharge status: Home / Self Care | Payer: Medicare Other | Source: Ambulatory Visit | Attending: Internal Medicine | Admitting: Internal Medicine

## 2013-12-02 ENCOUNTER — Encounter: Payer: Self-pay | Admitting: Internal Medicine

## 2013-12-02 ENCOUNTER — Ambulatory Visit (INDEPENDENT_AMBULATORY_CARE_PROVIDER_SITE_OTHER): Payer: Medicare Other | Admitting: Internal Medicine

## 2013-12-02 ENCOUNTER — Telehealth: Payer: Self-pay

## 2013-12-02 VITALS — BP 118/72 | HR 73 | Temp 97.0°F | Wt 102.5 lb

## 2013-12-02 DIAGNOSIS — K5909 Other constipation: Secondary | ICD-10-CM | POA: Diagnosis not present

## 2013-12-02 DIAGNOSIS — R109 Unspecified abdominal pain: Secondary | ICD-10-CM

## 2013-12-02 DIAGNOSIS — K5900 Constipation, unspecified: Secondary | ICD-10-CM | POA: Diagnosis not present

## 2013-12-02 LAB — CBC WITH DIFFERENTIAL/PLATELET
Basophils Absolute: 0 10*3/uL (ref 0.0–0.1)
EOS ABS: 0 10*3/uL (ref 0.0–0.7)
HCT: 37.9 % (ref 36.0–46.0)
HEMOGLOBIN: 11.6 g/dL — AB (ref 12.0–15.0)
LYMPHS ABS: 1.2 10*3/uL (ref 0.7–4.0)
Lymphocytes Relative: 32 % (ref 12–46)
MCH: 28.5 pg (ref 26.0–34.0)
MCHC: 30.7 g/dL (ref 30.0–36.0)
MCV: 93.3 fL (ref 78.0–100.0)
MONOS PCT: 8 % (ref 3–12)
Monocytes Absolute: 0.3 10*3/uL (ref 0.1–1.0)
NEUTROS PCT: 60 % (ref 43–77)
Neutro Abs: 2.2 10*3/uL (ref 1.7–7.7)
Platelets: 149 10*3/uL — ABNORMAL LOW (ref 150–400)
RBC: 4.07 MIL/uL (ref 3.87–5.11)
RDW: 12.5 % (ref 11.5–15.5)
WBC: 3.7 10*3/uL — AB (ref 4.0–10.5)

## 2013-12-02 LAB — POCT URINALYSIS DIPSTICK
Bilirubin, UA: NEGATIVE
Blood, UA: NEGATIVE
Glucose, UA: NEGATIVE
KETONES UA: NEGATIVE
LEUKOCYTES UA: NEGATIVE
Nitrite, UA: NEGATIVE
Protein, UA: NEGATIVE
SPEC GRAV UA: 1.01
Urobilinogen, UA: 1
pH, UA: 6

## 2013-12-02 NOTE — Patient Instructions (Signed)
Use Dulcolax suppository once and call with progress report in 24 hours.

## 2013-12-02 NOTE — Progress Notes (Signed)
   Subjective:    Patient ID: Erika Murphy, female    DOB: 08-Apr-1950, 63 y.o.   MRN: [id number]  HPI  Pt has been started on Baclofen extended release preparation for Research Study and says this may be making her sick. Went about 5 days with no BM. Ususally has BM daily. Has had some LLQ abdominal pain but no fever or chills. Used to take Baclofen 10 mg prn per Dr. Erling Cruz in the past when had leg spasms.    Review of Systems     Objective:   Physical Exam Urinalysis is normal. CBC is stable with no elevated white blood cell count. Abdomen is soft nondistended without hepatosplenomegaly. She has some very mild left quadrant tenderness without rebound. Small amount of stool in rectal vault. She is not impacted.  KUB flat and upright shows no bowel obstruction or free air. Some air filled loops of bowel.       Assessment & Plan:  Abdominal pain  Obstipation  Plan: Recommend Dulcolax suppository 1 and call with progress report in 24 hours.

## 2013-12-02 NOTE — Telephone Encounter (Signed)
Patient informed to go back to [facility] for abdominal xray.  There was confusion between what Dr Renold Genta wanted and what [facility] was understanding.  Patient says she cannot go back until tomorrow.  Advised patient to be sure to have done as soon as possible.

## 2013-12-02 NOTE — Telephone Encounter (Signed)
I have called Erika Murphy, she to come baclofen ER in November 20, no side effect noticed, she took the second dose November 20 first 2015, she only to those 2 doses.   Woke up November 20 second 2015, feel a knot in her stomach, decreased appetite, vague abdominal pain, constipation ever since then.  She had a history of gastric adenocarcinoma, I have advised her to follow-up with her primary care Dr. Renold Genta, and oncologist Dr. Martha Clan.

## 2013-12-03 ENCOUNTER — Telehealth: Payer: Self-pay

## 2013-12-03 NOTE — Telephone Encounter (Signed)
Patient aware of xray results.  She is to stop baclofen and start dulcolax and will call with report in 24 hours.

## 2013-12-03 NOTE — Telephone Encounter (Signed)
-----   Message from Elby Showers, MD sent at 12/02/2013  6:39 PM EST ----- Please stop Baclofen. Try Dulcolax suppository and call us with progress report in 24 hours.

## 2013-12-04 ENCOUNTER — Other Ambulatory Visit: Payer: Medicare Other

## 2013-12-12 ENCOUNTER — Encounter: Payer: Self-pay | Admitting: Internal Medicine

## 2013-12-12 ENCOUNTER — Ambulatory Visit (INDEPENDENT_AMBULATORY_CARE_PROVIDER_SITE_OTHER): Payer: Medicare Other | Admitting: Internal Medicine

## 2013-12-12 VITALS — BP 138/82 | HR 78 | Temp 98.3°F | Wt 102.0 lb

## 2013-12-12 DIAGNOSIS — E538 Deficiency of other specified B group vitamins: Secondary | ICD-10-CM | POA: Diagnosis not present

## 2013-12-12 MED ORDER — CYANOCOBALAMIN 1000 MCG/ML IJ SOLN
1000.0000 ug | Freq: Once | INTRAMUSCULAR | Status: AC
  Administered 2013-12-12: 1000 ug via INTRAMUSCULAR

## 2013-12-12 NOTE — Progress Notes (Signed)
Patient seen today for B12 injection.  She is feeling well.  Injection tolerated well.

## 2013-12-18 ENCOUNTER — Encounter: Payer: Medicare Other | Admitting: Neurology

## 2013-12-30 ENCOUNTER — Telehealth: Payer: Self-pay | Admitting: Neurology

## 2013-12-30 NOTE — Telephone Encounter (Signed)
I have reviewed her chart, she started Baclofen ER 30mg  since Nov 20th 2015, after 2 doses, she complains of stomach pain, has history of gastric adenocarcinoma, was see by her PCP 12/02/2013, Dr.Baxley, x-ray of abdomen showed no acute findings, she also has constipations, was given Dulcolax suppository, per note 12/12/2013, her symptoms has much improved   I failed to reach her at her home phone, and cell phone today, left message for her to call back.

## 2013-12-30 NOTE — Telephone Encounter (Addendum)
I have attempted without success to contact this patient by phone and cell phone. I left a message for the patient to call me back.

## 2013-12-31 ENCOUNTER — Telehealth: Payer: Self-pay | Admitting: Neurology

## 2013-12-31 NOTE — Telephone Encounter (Signed)
I have discussed with Erika Murphy, she wants to withdraw from [facility] study.   Her GI symptoms has much improved.   Jan 12th 2016, 1pm exit visit appt.

## 2014-01-13 ENCOUNTER — Ambulatory Visit (INDEPENDENT_AMBULATORY_CARE_PROVIDER_SITE_OTHER): Payer: Medicare Other | Admitting: Internal Medicine

## 2014-01-13 ENCOUNTER — Telehealth: Payer: Self-pay | Admitting: Neurology

## 2014-01-13 ENCOUNTER — Ambulatory Visit (INDEPENDENT_AMBULATORY_CARE_PROVIDER_SITE_OTHER): Payer: Self-pay | Admitting: Neurology

## 2014-01-13 VITALS — BP 112/68 | HR 64 | Temp 98.0°F | Resp 16

## 2014-01-13 DIAGNOSIS — G35 Multiple sclerosis: Secondary | ICD-10-CM

## 2014-01-13 DIAGNOSIS — E538 Deficiency of other specified B group vitamins: Secondary | ICD-10-CM | POA: Diagnosis not present

## 2014-01-13 MED ORDER — CYANOCOBALAMIN 1000 MCG/ML IJ SOLN
1000.0000 ug | Freq: Once | INTRAMUSCULAR | Status: AC
  Administered 2014-01-13: 1000 ug via INTRAMUSCULAR

## 2014-01-13 NOTE — Progress Notes (Signed)
[facility]  PATIENT: Erika Murphy DOB: Aug 19, 1950  HISTORICAL  Erika Murphy is a 64 years old right-handed African American female, patient of Dr. Erling Cruz, followed up for multiple sclerosis, last clinical visit was Sep 2014  Her primary care physician is Dr. Tommie Ard Baxley  She was diagnosed with multiple sclerosis in 1998, presented with spastic gait disorder, was treated with Betaseron since 1998, which was stopped in 2011 due to abnormal liver functional test, her abnormal liver function test did recover back to normal after stopping Betaseron.  Her multiple sclerosis symptoms have been  fairly stable over years, there was no worsening of her symptoms without taking Betaseron and baclofen, she is only taking Lyrica 50 mg 3 times a day for neuropathic pain involving bilateral upper and lower extremities.  Since July 2014, she also had intermittent left shoulder radiating pain to her left lateral arm, left hand, sometimes woke her up from sleep, intermittent, there was no significant worsening of weakness.  She also had a history of left shoulder replacement in 2011,  She has a history of gastric adenocarcinoma, status post subtotal gastrectomy, partial omentectomy in March 20001, status post chemotherapy and radiation therapy.  Most recent MRI of the brain was in June 2013, small white matter hyperintensities are unchanged from 2010. No enhancing or restricted diffusion lesions are seen. These lesions are nonspecific and could be seen with multiple sclerosis or chronic microvascular ischemia most abnormality is seen in MRI of cervical spine, most recently in 2011,  MRI scan of the cervical spine in 2011 showed prominent spondylitic changes from C4-6 with mild left sided foramnial stenosis at C4-5, C5-6 and bilateral foraminal stenosis at C6-7. Illdefined spinal cord hyperintensities at C5-6 and T2-3 likely represent remote age demyelinating plaque  She lives alone, ambulate  with a cane, no bowel bladder incontinence.  UPDATE Sep 15th 2015: She is dong well, still lives alone in a house with couple steps, using cane to ambulate, she never drive. She is taking Lyrica 50 mg 3 times a day for her upper and lower extremity pain, which has been very helpful, taking Valium 5 mg as needed,  Over the years, her multiple sclerosis symptoms has been very stable, there is no worsening of her gait problem.  UPDATE Oct 21 2013; She was admitted to the hospital in October 8th 2015, complaining of worsening gait difficulty, worsening right arm, and right leg weakness, deep achy pain, this was preceded by vomitting, loose stool for few days, was treated with IV Solu-Medrol for 3 days, repeat MRI of the brain, and cervical spine showed no acute lesions.  She continued to ambulate with a cane, dragging her right leg, reported 80% improvement, receiving home physical therapy, which has been helpful, Tylenol has been helpful too  UPDATE Nov 13 2013: Screening visit for [facility], she is on baclofen 10mg  tid since Oct 7th 2015, tolerating it well.  UPDATE Nov 21 2013: Came in for [place] visit one, dispense Baclofen ER 30mg  qhs. She is doing well  UPDATE Jan 12th 2016; Exit visit for [facility], she was started on baclofen ER 30 mg since November 21 2013, after 2 doses, 20 first, 20 second, she woke up November 20 third 2015, noticed abdominal pain, was evaluated by her primary care physician Dr. Renold Genta, x-ray of abdomen showed no significant abnormality other than the constipation, she was given suppository, in her follow-up visit with Dr. Renold Genta December 12 2013, her symptoms has much improved,  Above-mentioned  symptoms last likely due to baclofen ER, understandable that patient has a history of gastric adenocarcinoma, she does not want to continue within the research, this is her exit visit.  She has been doing well, no longer has abdominal pain   REVIEW OF SYSTEMS: Full 14  system review of systems performed and notable only for numbness, fatigue, ringing in ears, eye pain, cold intolerance, dizziness, headaches, numbness, weakness, achy muscles, walking difficulties  ALLERGIES: No Known Allergies  HOME MEDICATIONS: Outpatient Prescriptions Prior to Visit  Medication Sig Dispense Refill  . albuterol (PROVENTIL HFA;VENTOLIN HFA) 108 (90 BASE) MCG/ACT inhaler Inhale 2 puffs into the lungs every 6 (six) hours as needed for wheezing or shortness of breath.    . baclofen (LIORESAL) 10 MG tablet Take 1 tablet (10 mg total) by mouth 3 (three) times daily. (Patient not taking: Reported on 12/02/2013) 90 each 3  . diazepam (VALIUM) 5 MG tablet Take 5 mg by mouth as needed for anxiety.     . lansoprazole (PREVACID) 30 MG capsule Take 30 mg by mouth daily at 12 noon.    . Multiple Vitamin (MULTIVITAMIN) tablet Take 1 tablet by mouth daily.    . pregabalin (LYRICA) 50 MG capsule Take 50 mg by mouth 3 (three) times daily.     No facility-administered medications prior to visit.    PAST MEDICAL HISTORY: Past Medical History  Diagnosis Date  . MS (multiple sclerosis) dx'd ~ 1998  . Osteopenia   . IBS (irritable bowel syndrome)   . Neurogenic bladder   . Stomach cancer 2001  . Asthma   . History of blood transfusion 2001    "when I had my stomach cancer"  . GERD (gastroesophageal reflux disease)   . Thyroid nodule     right  . Hypothyroidism   . Arthritis     "right shoulder" (10/09/2013)  . Osteoporosis   . Fibromyalgia   . B12 deficiency anemia     PAST SURGICAL HISTORY: Past Surgical History  Procedure Laterality Date  . Partial gastrectomy  2001  . Cesarean section  1975; 1984  . Tonsillectomy  ~ 1959  . Joint replacement    . Omentectomy  2001    partial  . Total shoulder arthroplasty Left 2011  . Biopsy thyroid Right 1990's    FAMILY HISTORY: Family History  Problem Relation Age of Onset  . Heart disease Mother   . Diabetes Father      SOCIAL HISTORY:  History   Social History  . Marital Status: Divorced    Spouse Name: N/A    Number of Children: 3   . Years of Education: N/A   Occupational History  . DISABLED    Social History Main Topics  . Smoking status: Never Smoker   . Smokeless tobacco: Never Used     Comment: NEVER USED TOBACCO  . Alcohol Use: No  . Drug Use: No  . Sexual Activity: No   Other Topics Concern  . Not on file   Social History Narrative   Patient is retired.    Patient is married with 3 children.   Patient drinks caffeine daily.      PHYSICAL EXAM    There were no vitals filed for this visit.   There is no weight on file to calculate BMI.   Generalized: In no acute distress  Neck: Supple, no carotid bruits   Cardiac: Regular rate rhythm  Pulmonary: Clear to auscultation bilaterally  Musculoskeletal: No deformity  Neurological  examination  Mentation: Alert oriented to time, place, history taking, and causual conversation,  Very thin  Cranial nerve II-XII: Pupils were equal round reactive to light extraocular movements were full, visual field were full on confrontational test. facial sensation and strength were normal. hearing was intact to finger rubbing bilaterally. Uvula tongue midline.  head turning and shoulder shrug and were normal and symmetric.Tongue protrusion into cheek strength was normal.  Motor: She has right upper extremity fixation on rapid rotating movement, she has moderate spasticity of bilateral lower extremity. bilateral lower extremity (right/left), hip flexion 4/4+, knee flexion 4/5, knee extension 4/5, ankle dorsiflexion 3/5 ankle plantar flexion 4+/5,    Sensory: Intact to fine touch  Coordination: Normal finger to nose, heel-to-shin bilaterally there was no truncal ataxia  Gait: Needed assistance to get up from seated position, spastic, stiff, cautious gait, dragging her right leg Romberg signs: Negative  Deep tendon reflexes:  Brachioradialis 2/2, biceps 2/2, triceps 2/2, patellar 3/3, Achilles 2/2,   DIAGNOSTIC DATA (LABS, IMAGING, TESTING) - I reviewed patient records, labs, notes, testing and imaging myself where available.  Lab Results  Component Value Date   WBC 3.7* 12/02/2013   HGB 11.6* 12/02/2013   HCT 37.9 12/02/2013   MCV 93.3 12/02/2013   PLT 149* 12/02/2013      Component Value Date/Time   NA 143 11/20/2013 1116   K 4.1 11/20/2013 1116   CL 106 11/20/2013 1116   CO2 27 11/20/2013 1116   GLUCOSE 76 11/20/2013 1116   BUN 13 11/20/2013 1116   CREATININE 1.08 11/20/2013 1116   CREATININE 1.00 04/11/2013 1040   CALCIUM 9.5 11/20/2013 1116   PROT 7.2 11/20/2013 1116   ALBUMIN 4.1 11/20/2013 1116   AST 31 11/20/2013 1116   ALT 16 11/20/2013 1116   ALKPHOS 104 11/20/2013 1116   BILITOT 0.6 11/20/2013 1116   GFRNONAA 65* 10/12/2013 0600   GFRAA 75* 10/12/2013 0600   Lab Results  Component Value Date   VITAMINB12 648 09/10/2012   Lab Results  Component Value Date   TSH 0.314* 10/09/2013     ASSESSMENT AND PLAN   64 years old right-handed African American female, with past medical history of relapsing remitting multiple sclerosis, was treated with Betaseron from 1998 to 2011, developed abnormal liver function in the past, which has recovered after stopping Betaseron, the abnormalities are mainly at the cervical spine, detailed above, minimal findings on MRI of the brain, symptoms has been stable for many years. She also had a history of gastric adenocarcinoma, status post gastrectomy, followed by chemotherapy and radiation therapy in 2001,   Has baseline moderate bilateral lower extremity spasticity, moderate gait difficulty, Will not start any long-term disease modifying medication at this point, her recurrent episode of worsening gait difficulty, right side difficulty, likely due to decompensation,  RTC In 6 months    Marcial Pacas, M.D. Ph.D.  [facility] [address] [place], [place] [address] [phone]

## 2014-01-13 NOTE — Progress Notes (Signed)
   Subjective:    Patient ID: Erika Murphy, female    DOB: 1950/12/28, 64 y.o.   MRN: [id number]  HPI Patient was enrolled in a research study regarding baclofen. Was taking 30 mg at a time. Says it made her sick. She is going to disenroll and the study. She does have some 10 mg baclofen at home which she takes on a when necessary basis. Currently taking Lyrica 75 mg twice daily. Says she's doing well. History of B-12 deficiency. Here today for B-12 injection. No other complaints or problems today.    Review of Systems     Objective:   Physical Exam   Not examined. 1 mL IM B-12 injection given     Assessment & Plan:  B-12 deficiency  Multiple sclerosis  History of gastric cancer  Plan: Continue monthly B 12 injections here. She is going to disenroll in Baclofen study

## 2014-01-13 NOTE — Telephone Encounter (Signed)
I spoke to the patient to remind her of her appointment today [phone] at 13:00h. I also reminded her to bring all of her Study Medicine along with the "Daily Diaries."

## 2014-01-13 NOTE — Patient Instructions (Addendum)
Return for monthly B-12 injections.

## 2014-01-22 NOTE — Progress Notes (Signed)
Erika Murphy (Subject # [id number]) was seen on [phone] for Premature Termination Visit for the [facility] [id number] Research Trial: A Clinical Evaluation of the Safety of Baclofen ER Capsules (GRS) when administered once daily to subjects with spasticity due to Multiple Sclerosis (MS): An Open Label, Long Term, Safety trial. Vital signs were non-clinically-significant. Complete Physical and Neurological Examinations were performed by Principal Investigator: Dr. Marcial Pacas. Clinical laboratory tests were performed. Research Coordinator, Maida Sale [place], conducted C-SSRS with no significant findings. Patient returned 33 pills of the study medication. Subject Diary was collected. Patient withdrew from study.

## 2014-02-13 ENCOUNTER — Ambulatory Visit: Payer: Medicare Other | Admitting: Internal Medicine

## 2014-02-16 ENCOUNTER — Ambulatory Visit: Payer: Medicare Other | Admitting: Internal Medicine

## 2014-02-19 ENCOUNTER — Ambulatory Visit (INDEPENDENT_AMBULATORY_CARE_PROVIDER_SITE_OTHER): Payer: Medicare Other | Admitting: Internal Medicine

## 2014-02-19 VITALS — BP 110/70 | HR 65 | Temp 98.0°F

## 2014-02-19 DIAGNOSIS — E538 Deficiency of other specified B group vitamins: Secondary | ICD-10-CM | POA: Diagnosis not present

## 2014-02-19 MED ORDER — CYANOCOBALAMIN 1000 MCG/ML IJ SOLN
1000.0000 ug | Freq: Once | INTRAMUSCULAR | Status: AC
  Administered 2014-02-19: 1000 ug via INTRAMUSCULAR

## 2014-02-19 NOTE — Progress Notes (Signed)
Patient presents today for B 12 Injection. Patient tolerated well.

## 2014-02-20 ENCOUNTER — Telehealth: Payer: Self-pay | Admitting: *Deleted

## 2014-02-20 MED ORDER — PANTOPRAZOLE SODIUM 40 MG PO TBEC: 40.0000 mg | DELAYED_RELEASE_TABLET | Freq: Every day | ORAL | Status: DC

## 2014-02-24 NOTE — Telephone Encounter (Signed)
Reflux medication change to generic protonix due to insurance coverage

## 2014-04-10 ENCOUNTER — Other Ambulatory Visit: Payer: Medicare Other | Admitting: Internal Medicine

## 2014-04-10 DIAGNOSIS — R5383 Other fatigue: Secondary | ICD-10-CM

## 2014-04-10 DIAGNOSIS — E559 Vitamin D deficiency, unspecified: Secondary | ICD-10-CM

## 2014-04-10 DIAGNOSIS — Z13220 Encounter for screening for lipoid disorders: Secondary | ICD-10-CM | POA: Diagnosis not present

## 2014-04-10 DIAGNOSIS — G35 Multiple sclerosis: Secondary | ICD-10-CM | POA: Diagnosis not present

## 2014-04-10 DIAGNOSIS — Z136 Encounter for screening for cardiovascular disorders: Principal | ICD-10-CM

## 2014-04-10 LAB — COMPLETE METABOLIC PANEL WITH GFR
ALT: 13 U/L (ref 0–35)
AST: 24 U/L (ref 0–37)
Albumin: 3.8 g/dL (ref 3.5–5.2)
Alkaline Phosphatase: 110 U/L (ref 39–117)
BILIRUBIN TOTAL: 0.9 mg/dL (ref 0.2–1.2)
BUN: 16 mg/dL (ref 6–23)
CALCIUM: 9.2 mg/dL (ref 8.4–10.5)
CHLORIDE: 107 meq/L (ref 96–112)
CO2: 28 meq/L (ref 19–32)
Creat: 1.11 mg/dL — ABNORMAL HIGH (ref 0.50–1.10)
GFR, EST AFRICAN AMERICAN: 60 mL/min
GFR, EST NON AFRICAN AMERICAN: 52 mL/min — AB
GLUCOSE: 83 mg/dL (ref 70–99)
Potassium: 4.5 mEq/L (ref 3.5–5.3)
Sodium: 142 mEq/L (ref 135–145)
Total Protein: 6.7 g/dL (ref 6.0–8.3)

## 2014-04-10 LAB — CBC WITH DIFFERENTIAL/PLATELET
Basophils Absolute: 0 10*3/uL (ref 0.0–0.1)
Basophils Relative: 1 % (ref 0–1)
EOS PCT: 2 % (ref 0–5)
Eosinophils Absolute: 0.1 10*3/uL (ref 0.0–0.7)
HCT: 35.3 % — ABNORMAL LOW (ref 36.0–46.0)
Hemoglobin: 11.5 g/dL — ABNORMAL LOW (ref 12.0–15.0)
LYMPHS PCT: 31 % (ref 12–46)
Lymphs Abs: 1.1 10*3/uL (ref 0.7–4.0)
MCH: 28.5 pg (ref 26.0–34.0)
MCHC: 32.6 g/dL (ref 30.0–36.0)
MCV: 87.4 fL (ref 78.0–100.0)
MPV: 10.4 fL (ref 8.6–12.4)
Monocytes Absolute: 0.4 10*3/uL (ref 0.1–1.0)
Monocytes Relative: 10 % (ref 3–12)
Neutro Abs: 2 10*3/uL (ref 1.7–7.7)
Neutrophils Relative %: 56 % (ref 43–77)
PLATELETS: 178 10*3/uL (ref 150–400)
RBC: 4.04 MIL/uL (ref 3.87–5.11)
RDW: 13.2 % (ref 11.5–15.5)
WBC: 3.5 10*3/uL — ABNORMAL LOW (ref 4.0–10.5)

## 2014-04-10 LAB — LIPID PANEL
CHOLESTEROL: 196 mg/dL (ref 0–200)
HDL: 47 mg/dL (ref >=46)
LDL Cholesterol: 132 mg/dL — ABNORMAL HIGH (ref 0–99)
Total CHOL/HDL Ratio: 4.2 Ratio
Triglycerides: 87 mg/dL (ref <150)
VLDL: 17 mg/dL (ref 0–40)

## 2014-04-10 LAB — TSH: TSH: 0.743 u[IU]/mL (ref 0.350–4.500)

## 2014-04-11 LAB — VITAMIN D 25 HYDROXY (VIT D DEFICIENCY, FRACTURES): VIT D 25 HYDROXY: 10 ng/mL — AB (ref 30–100)

## 2014-04-13 ENCOUNTER — Other Ambulatory Visit (HOSPITAL_COMMUNITY)
Admission: RE | Admit: 2014-04-13 | Discharge: 2014-04-13 | Disposition: A | Discharge status: Home / Self Care | Payer: Medicare Other | Source: Ambulatory Visit | Attending: Internal Medicine | Admitting: Internal Medicine

## 2014-04-13 ENCOUNTER — Encounter: Payer: Self-pay | Admitting: Internal Medicine

## 2014-04-13 ENCOUNTER — Ambulatory Visit (INDEPENDENT_AMBULATORY_CARE_PROVIDER_SITE_OTHER): Payer: Medicare Other | Admitting: Internal Medicine

## 2014-04-13 VITALS — BP 106/68 | HR 75 | Temp 97.9°F | Ht 64.0 in | Wt 102.5 lb

## 2014-04-13 DIAGNOSIS — Z87440 Personal history of urinary (tract) infections: Secondary | ICD-10-CM | POA: Diagnosis not present

## 2014-04-13 DIAGNOSIS — M8580 Other specified disorders of bone density and structure, unspecified site: Secondary | ICD-10-CM

## 2014-04-13 DIAGNOSIS — E538 Deficiency of other specified B group vitamins: Secondary | ICD-10-CM

## 2014-04-13 DIAGNOSIS — N319 Neuromuscular dysfunction of bladder, unspecified: Secondary | ICD-10-CM

## 2014-04-13 DIAGNOSIS — Z8639 Personal history of other endocrine, nutritional and metabolic disease: Secondary | ICD-10-CM | POA: Diagnosis not present

## 2014-04-13 DIAGNOSIS — Z124 Encounter for screening for malignant neoplasm of cervix: Secondary | ICD-10-CM | POA: Diagnosis not present

## 2014-04-13 DIAGNOSIS — Z789 Other specified health status: Secondary | ICD-10-CM

## 2014-04-13 DIAGNOSIS — R269 Unspecified abnormalities of gait and mobility: Secondary | ICD-10-CM | POA: Diagnosis not present

## 2014-04-13 DIAGNOSIS — Z85028 Personal history of other malignant neoplasm of stomach: Secondary | ICD-10-CM | POA: Diagnosis not present

## 2014-04-13 DIAGNOSIS — Z0000 Encounter for general adult medical examination without abnormal findings: Secondary | ICD-10-CM | POA: Diagnosis not present

## 2014-04-13 DIAGNOSIS — G35 Multiple sclerosis: Secondary | ICD-10-CM

## 2014-04-13 DIAGNOSIS — Z87898 Personal history of other specified conditions: Secondary | ICD-10-CM

## 2014-04-13 MED ORDER — CYANOCOBALAMIN 1000 MCG/ML IJ SOLN
1000.0000 ug | Freq: Once | INTRAMUSCULAR | Status: AC
  Administered 2014-04-13: 1000 ug via INTRAMUSCULAR

## 2014-04-13 MED ORDER — ERGOCALCIFEROL 1.25 MG (50000 UT) PO CAPS: ORAL_CAPSULE | ORAL | Status: DC

## 2014-04-15 LAB — CYTOLOGY - PAP

## 2014-04-16 ENCOUNTER — Other Ambulatory Visit: Payer: Self-pay | Admitting: Internal Medicine

## 2014-04-17 NOTE — Telephone Encounter (Signed)
Refilled Lyrica

## 2014-04-24 ENCOUNTER — Encounter: Payer: Self-pay | Admitting: Internal Medicine

## 2014-04-24 ENCOUNTER — Ambulatory Visit (INDEPENDENT_AMBULATORY_CARE_PROVIDER_SITE_OTHER): Payer: Medicare Other | Admitting: Internal Medicine

## 2014-04-24 VITALS — BP 120/80 | HR 78 | Temp 98.1°F

## 2014-04-24 DIAGNOSIS — L989 Disorder of the skin and subcutaneous tissue, unspecified: Secondary | ICD-10-CM | POA: Diagnosis not present

## 2014-04-24 DIAGNOSIS — Z0000 Encounter for general adult medical examination without abnormal findings: Secondary | ICD-10-CM

## 2014-04-24 LAB — POCT URINALYSIS DIPSTICK
BILIRUBIN UA: NEGATIVE
GLUCOSE UA: NEGATIVE
KETONES UA: NEGATIVE
LEUKOCYTES UA: NEGATIVE
NITRITE UA: NEGATIVE
Protein, UA: NEGATIVE
RBC UA: NEGATIVE
SPEC GRAV UA: 1.02
UROBILINOGEN UA: NEGATIVE
pH, UA: 6

## 2014-04-24 MED ORDER — KETOCONAZOLE 2 % EX CREA: 1.0000 "application " | TOPICAL_CREAM | Freq: Every day | CUTANEOUS | Status: DC

## 2014-04-24 NOTE — Addendum Note (Signed)
Addended by: Leota Jacobsen on: 04/24/2014 02:59 PM   Modules accepted: Orders

## 2014-04-24 NOTE — Progress Notes (Signed)
   Subjective:    Patient ID: Erika Murphy, female    DOB: 1950-09-20, 64 y.o.   MRN: [id number]  HPI Patient has lesion on left leg has been present for about a month. Says it initially started as what appeared to be an insect bite but progressed to a larger lesion that has been slow to heal.    Review of Systems     Objective:   Physical Exam  1.25 cm lesion left leg . Has small papules around the outer average but no frank arena. Has what appeared to be an abraded area that is trying to heal in center. No pus expressed or other discharge.      Assessment & Plan:  Possible tinea corporis  Possible insect bite    Plan: Trial of Nizoral cream to use daily for 3 weeks. She has appointment to be seen for another matter on May 13 and can follow-up then. If lesion does not improve, she will need to be referred to dermatologist for possible biopsy. She does have a history of gastric malignancy and multiple sclerosis.  Urinalysis was checked today. We could not get specimen at last visit.

## 2014-04-24 NOTE — Patient Instructions (Signed)
Apply Nizoral cream daily for 3 weeks. Recheck May 13

## 2014-05-15 ENCOUNTER — Ambulatory Visit: Payer: Medicare Other | Admitting: Internal Medicine

## 2014-05-21 ENCOUNTER — Other Ambulatory Visit: Payer: Medicare Other

## 2014-05-21 ENCOUNTER — Encounter: Payer: Self-pay | Admitting: Internal Medicine

## 2014-05-21 ENCOUNTER — Ambulatory Visit (INDEPENDENT_AMBULATORY_CARE_PROVIDER_SITE_OTHER): Payer: Medicare Other | Admitting: Internal Medicine

## 2014-05-21 ENCOUNTER — Ambulatory Visit: Payer: Medicare Other | Admitting: Hematology & Oncology

## 2014-05-21 VITALS — BP 118/78 | HR 80 | Temp 97.4°F

## 2014-05-21 DIAGNOSIS — E538 Deficiency of other specified B group vitamins: Secondary | ICD-10-CM | POA: Diagnosis not present

## 2014-05-21 MED ORDER — CYANOCOBALAMIN 1000 MCG/ML IJ SOLN
1000.0000 ug | Freq: Once | INTRAMUSCULAR | Status: AC
  Administered 2014-05-21: 1000 ug via INTRAMUSCULAR

## 2014-05-21 NOTE — Progress Notes (Signed)
Patient presents today for B12 injection. VS stable. Patient tolerated injection well 

## 2014-06-10 ENCOUNTER — Other Ambulatory Visit: Payer: Self-pay | Admitting: Internal Medicine

## 2014-06-12 ENCOUNTER — Other Ambulatory Visit (HOSPITAL_BASED_OUTPATIENT_CLINIC_OR_DEPARTMENT_OTHER): Payer: Medicare Other

## 2014-06-12 ENCOUNTER — Ambulatory Visit (HOSPITAL_BASED_OUTPATIENT_CLINIC_OR_DEPARTMENT_OTHER): Payer: Medicare Other | Admitting: Hematology & Oncology

## 2014-06-12 ENCOUNTER — Encounter: Payer: Self-pay | Admitting: Hematology & Oncology

## 2014-06-12 VITALS — BP 111/55 | HR 71 | Temp 98.3°F | Resp 14 | Ht 64.0 in | Wt 104.0 lb

## 2014-06-12 DIAGNOSIS — G35 Multiple sclerosis: Secondary | ICD-10-CM

## 2014-06-12 DIAGNOSIS — Z85028 Personal history of other malignant neoplasm of stomach: Secondary | ICD-10-CM | POA: Diagnosis not present

## 2014-06-12 DIAGNOSIS — D500 Iron deficiency anemia secondary to blood loss (chronic): Secondary | ICD-10-CM

## 2014-06-12 DIAGNOSIS — G35D Multiple sclerosis, unspecified: Secondary | ICD-10-CM

## 2014-06-12 DIAGNOSIS — D002 Carcinoma in situ of stomach: Secondary | ICD-10-CM | POA: Diagnosis not present

## 2014-06-12 LAB — COMPREHENSIVE METABOLIC PANEL
ALT: 21 U/L (ref 0–35)
AST: 29 U/L (ref 0–37)
Albumin: 3.7 g/dL (ref 3.5–5.2)
Alkaline Phosphatase: 111 U/L (ref 39–117)
BUN: 14 mg/dL (ref 6–23)
CHLORIDE: 107 meq/L (ref 96–112)
CO2: 26 meq/L (ref 19–32)
Calcium: 9.2 mg/dL (ref 8.4–10.5)
Creatinine, Ser: 1.13 mg/dL — ABNORMAL HIGH (ref 0.50–1.10)
Glucose, Bld: 93 mg/dL (ref 70–99)
Potassium: 3.8 mEq/L (ref 3.5–5.3)
Sodium: 146 mEq/L — ABNORMAL HIGH (ref 135–145)
Total Bilirubin: 0.8 mg/dL (ref 0.2–1.2)
Total Protein: 6.7 g/dL (ref 6.0–8.3)

## 2014-06-12 LAB — CBC WITH DIFFERENTIAL (CANCER CENTER ONLY)
BASO#: 0 10*3/uL (ref 0.0–0.2)
BASO%: 0.6 % (ref 0.0–2.0)
EOS%: 3.1 % (ref 0.0–7.0)
Eosinophils Absolute: 0.1 10*3/uL (ref 0.0–0.5)
HEMATOCRIT: 35.7 % (ref 34.8–46.6)
HGB: 11.9 g/dL (ref 11.6–15.9)
LYMPH#: 1.2 10*3/uL (ref 0.9–3.3)
LYMPH%: 34.3 % (ref 14.0–48.0)
MCH: 29.9 pg (ref 26.0–34.0)
MCHC: 33.3 g/dL (ref 32.0–36.0)
MCV: 90 fL (ref 81–101)
MONO#: 0.4 10*3/uL (ref 0.1–0.9)
MONO%: 10.5 % (ref 0.0–13.0)
NEUT%: 51.5 % (ref 39.6–80.0)
NEUTROS ABS: 1.8 10*3/uL (ref 1.5–6.5)
PLATELETS: 173 10*3/uL (ref 145–400)
RBC: 3.98 10*6/uL (ref 3.70–5.32)
RDW: 12.3 % (ref 11.1–15.7)
WBC: 3.5 10*3/uL — AB (ref 3.9–10.0)

## 2014-06-12 LAB — RETICULOCYTES (CHCC)
ABS Retic: 45.7 10*3/uL (ref 19.0–186.0)
RBC.: 4.15 MIL/uL (ref 3.87–5.11)
Retic Ct Pct: 1.1 % (ref 0.4–2.3)

## 2014-06-12 NOTE — Progress Notes (Signed)
Hematology and Oncology Follow Up Visit  Erika Murphy [id number] 01-23-1950 64 y.o. 06/12/2014   Principle Diagnosis:   Stage II (T3N0M0) adenocarcinoma of the stomach-remission Recurrent iron deficiency anemia  Multiple sclerosis Current Therapy:    IV iron as indicated     Interim History:  [name] is back for follow-up. We last saw her back in November  She is doing quite well. She has had no flareups of the multiple sclerosis.  She's had no nausea or vomiting. She's had no cough. She's had no fever. She's had no change in bowel or bladder habits. She has not noted any leg swelling. There's been no weakness in her legs. She's had no bleeding.  She's had no rashes. There's been no visual issues.  As always, she will be going up to [place] to see family. There is a family reading, and up. She will going up in a couple weeks.  Overall, her performance status is ECOG 2-3.  Medications:  Current outpatient prescriptions:  .  albuterol (PROVENTIL HFA;VENTOLIN HFA) 108 (90 BASE) MCG/ACT inhaler, Inhale 2 puffs into the lungs every 6 (six) hours as needed for wheezing or shortness of breath., Disp: , Rfl:  .  ergocalciferol (VITAMIN D2) 50000 UNITS capsule, Take weekly x 12 weeks then take 2000 units Vitamin D3 daily after completing 12 weeks course., Disp: 4 capsule, Rfl: 3 .  LYRICA 50 MG capsule, TAKE ONE CAPSULE BY MOUTH 3 TIMES A DAY, Disp: 90 capsule, Rfl: 2 .  pantoprazole (PROTONIX) 40 MG tablet, TAKE 1 TABLET (40 MG TOTAL) BY MOUTH DAILY., Disp: 30 tablet, Rfl: 3  Allergies: No Known Allergies  Past Medical History, Surgical history, Social history, and Family History were reviewed and updated.  Review of Systems: As above  Physical Exam:  height is 5\' 4"  (1.626 m) and weight is 104 lb (47.174 kg). Her oral temperature is 98.3 F (36.8 C). Her blood pressure is 111/55 and her pulse is 71. Her respiration is 14.   Thin African female. Lungs are clear. Cardiac exam  regular rate and rhythm with no murmurs, rubs or bruits.. Abdomen is soft. She has good bowel sounds. There is no fluid wave. Has a well-healed laparotomy scar. There is no palpable liver or spleen tip. Back exam shows no tenderness over the spine ribs or hips. Extremities shows a symmetric muscle atrophy bilaterally. Skin exam no rashes, ecchymoses or petechia. Neurological exam is nonfocal.  Lab Results  Component Value Date   WBC 3.5* 06/12/2014   HGB 11.9 06/12/2014   HCT 35.7 06/12/2014   MCV 90 06/12/2014   PLT 173 06/12/2014     Chemistry      Component Value Date/Time   NA 142 04/10/2014 1027   K 4.5 04/10/2014 1027   CL 107 04/10/2014 1027   CO2 28 04/10/2014 1027   BUN 16 04/10/2014 1027   CREATININE 1.11* 04/10/2014 1027   CREATININE 1.08 11/20/2013 1116      Component Value Date/Time   CALCIUM 9.2 04/10/2014 1027   ALKPHOS 110 04/10/2014 1027   AST 24 04/10/2014 1027   ALT 13 04/10/2014 1027   BILITOT 0.9 04/10/2014 1027         Impression and Plan: Erika Murphy is a 64 year old African [place] female. She had a history of stage II stomach cancer. She underwent resection. She had adjuvant therapy. This is now 16 years in the past. She is cured of the stomach cancer.  We will go ahead  and plan for another followup in 6 months.  We will see what her iron studies show. I would be surprised if she needed any iron.  Hopefully, the multiple sclerosis will not be a problem for her.   [name], MD 6/10/20162:10 PM

## 2014-06-15 LAB — FERRITIN CHCC: FERRITIN: 39 ng/mL (ref 9–269)

## 2014-06-15 LAB — IRON AND TIBC CHCC
%SAT: 26 % (ref 21–57)
Iron: 88 ug/dL (ref 41–142)
TIBC: 338 ug/dL (ref 236–444)
UIBC: 250 ug/dL (ref 120–384)

## 2014-06-23 ENCOUNTER — Ambulatory Visit: Payer: Medicare Other | Admitting: Internal Medicine

## 2014-06-25 ENCOUNTER — Ambulatory Visit: Payer: Medicare Other | Admitting: Internal Medicine

## 2014-07-01 NOTE — Patient Instructions (Signed)
Continue monthly B-12 injections here. Continue same medications and follow-up with me in 6 months. Watch her weight try to eat as well as possible.

## 2014-07-01 NOTE — Progress Notes (Signed)
Subjective:    Patient ID: Erika Murphy, female    DOB: 1950/08/17, 64 y.o.   MRN: [id number]  HPI 64 year old Black Female in today for health maintenance exam and evaluation of medical problems. She has a history of multiple sclerosis, vitamin B-12 deficiency requiring monthly B-12 injections, history of gastric carcinoma status post surgery which left her B 12 deficient, history of iron deficiency. Has received IV iron in the past per Dr. Marin Olp who continues to follow her. She took Betaseron for multiple sclerosis for while but discontinued it. Thought it made her feel bad. She is doing fairly well. She ambulates with a cane. She has issues with anxiety. Has history of recurrent urinary infections due to neurogenic bladder from multiple sclerosis. Has had issues with appetite and holding her weight. Takes Lyrica for pain associated with multiple sclerosis.  Had subtotal gastrectomy and partial omentectomy in 2001. History of osteopenia.  Followed by neurologist. Has distal spastic gait disorder with more distal weakness than proximal weakness. Was diagnosed with multiple sclerosis in 1998.  Had helical Bactroban lower infection in 2006. No known drug allergies.  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 Is now disabled. Does not smoke or consume alcohol. Resides alone.  Family history: Mother died at age 75 of heart failure. Father died at age 58. He had blindness and was a diabetic. 2 sisters with diabetes. One brother died of GI cancer? Liver cancer.  Patient had colonoscopy in 2003    Review of Systems  Constitutional: Positive for fatigue.       History of weight loss  Eyes: Negative.   Respiratory: Negative.   Cardiovascular: Negative.   Gastrointestinal: Negative.   Genitourinary: Negative.   Neurological:       Difficulty walking  Psychiatric/Behavioral:   Anxiety at times       Objective:   Physical Exam  Constitutional: She is oriented to person, place, and time. She appears well-developed and well-nourished. No distress.  HENT:  Head: Normocephalic and atraumatic.  Right Ear: External ear normal.  Left Ear: External ear normal.  Mouth/Throat: Oropharynx is clear and moist. No oropharyngeal exudate.  Eyes: Conjunctivae and EOM are normal. Pupils are equal, round, and reactive to light. Right eye exhibits no discharge. No scleral icterus.  Neck: Neck supple. No JVD present. No thyromegaly present.  Cardiovascular: Normal rate, normal heart sounds and intact distal pulses.   No murmur heard. Pulmonary/Chest: Effort normal and breath sounds normal. She has no wheezes.  Breasts normal female without masses  Abdominal: Soft. Bowel sounds are normal. She exhibits no distension and no mass. There is no tenderness. There is no rebound and no guarding.  Musculoskeletal: She exhibits no edema.  Generalized weakness. Ambulates with a cane  Lymphadenopathy:    She has no cervical adenopathy.  Neurological: She is alert and oriented to person, place, and time. She has normal reflexes. No cranial nerve deficit.  Gait disorder secondary to multiple sclerosis  Skin: Skin is warm and dry. No rash noted. She is not diaphoretic.  Psychiatric: She has a normal mood and affect. Her behavior is normal. Judgment and thought content normal.  Vitals reviewed.         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 Followed by Dr. Marin Olp  B-12  deficiency related to gastric resection  History of iron deficiency secondary to gastrectomy  Osteopenia-not really candidate for Fosamax with history of subtotal gastrectomy  Neurogenic bladder secondary to multiple sclerosis with occasional urinary infections  History of anxiety  History of  weight loss-weight has improved from 102.8-104 in the past 2 months. Continue to monitor. Patient says she's eating well.  Subjective:   Patient presents for Medicare Annual/Subsequent preventive examination.  Review Past Medical/Family/Social: See above  Risk Factors  Current exercise habits: Sedentary due to multiple sclerosis Dietary issues discussed: Needs to maintain her weight  Cardiac risk factors: Family history in mother  Depression Screen  (Note: if answer to either of the following is "Yes", a more complete depression screening is indicated)   Over the past two weeks, have you felt down, depressed or hopeless? No  Over the past two weeks, have you felt little interest or pleasure in doing things? No Have you lost interest or pleasure in daily life? No Do you often feel hopeless? No Do you cry easily over simple problems? No   Activities of Daily Living  In your present state of health, do you have any difficulty performing the following activities?:   Driving? Has never driven Managing money? No  Feeding yourself? No  Getting from bed to chair? No  Climbing a flight of stairs? Yes Preparing food and eating?: No  Bathing or showering? No  Getting dressed: No  Getting to the toilet? No  Using the toilet:No  Moving around from place to place: Sometimes In the past year have you fallen or had a near fall?: Yes Are you sexually active? No  Do you have more than one partner? No   Hearing Difficulties: No  Do you often ask people to speak up or repeat themselves? No  Do you experience ringing or noises in your ears? No  Do you have difficulty understanding soft or whispered voices? No  Do you feel that you have a problem with memory? No Do you often misplace items? No    Home Safety:  Do you have a smoke alarm at your residence? Yes Do you have grab bars in the bathroom? Yes Do you have throw rugs in your house? No   Cognitive Testing  Alert? Yes Normal  Appearance?Yes  Oriented to person? Yes Place? Yes  Time? Yes  Recall of three objects? Yes  Can perform simple calculations? Yes  Displays appropriate judgment?Yes  Can read the correct time from a watch face?Yes   List the Names of Other Physician/Practitioners you currently use:  See referral list for the physicians patient is currently seeing.  Dr. Marin Olp  Neurologist   Review of Systems: See above   Objective:     General appearance: Thin black female in no acute distress. Ambulates with a cane Head: Normocephalic, without obvious abnormality, atraumatic  Eyes: conj clear, EOMi PEERLA  Ears: normal TM's and external ear canals both ears  Nose: Nares normal. Septum midline. Mucosa normal. No drainage or sinus tenderness.  Throat: lips, mucosa, and tongue normal; teeth and gums normal  Neck: no adenopathy, no carotid bruit, no JVD, supple, symmetrical, trachea midline and thyroid not enlarged, symmetric, no tenderness/mass/nodules  No CVA tenderness.  Lungs: clear to auscultation bilaterally  Breasts: normal appearance, no masses or tenderness Heart: regular rate and rhythm, S1, S2 normal, no murmur, click, rub or gallop  Abdomen: soft, non-tender; bowel sounds normal; no masses, no organomegaly  Musculoskeletal: ROM normal in all joints,  no crepitus, no deformity, Normal muscle strengthen. Back  is symmetric, no curvature. Skin: Skin color, texture, turgor normal. No rashes or lesions  Lymph nodes: Cervical, supraclavicular, and axillary nodes normal.  Neurologic: CN 2 -12 Normal, ambulates with a cane. Generalized weakness. Psych: Alert & Oriented x 3, Mood appear stable.    Assessment:    Annual wellness medicare exam   Plan:    During the course of the visit the patient was educated and counseled about appropriate screening and preventive services including:   Annual mammogram  Routine colonoscopies with Dr. Earlean Shawl     Patient Instructions (the written  plan) was given to the patient.  Medicare Attestation  I have personally reviewed:  The patient's medical and social history  Their use of alcohol, tobacco or illicit drugs  Their current medications and supplements  The patient's functional ability including ADLs,fall risks, home safety risks, cognitive, and hearing and visual impairment  Diet and physical activities  Evidence for depression or mood disorders  The patient's weight, height, BMI, and visual acuity have been recorded in the chart. I have made referrals, counseling, and provided education to the patient based on review of the above and I have provided the patient with a written personalized care plan for preventive services.

## 2014-07-10 ENCOUNTER — Ambulatory Visit (INDEPENDENT_AMBULATORY_CARE_PROVIDER_SITE_OTHER): Payer: Medicare Other | Admitting: Internal Medicine

## 2014-07-10 VITALS — BP 116/60 | HR 78 | Temp 97.8°F

## 2014-07-10 DIAGNOSIS — E538 Deficiency of other specified B group vitamins: Secondary | ICD-10-CM

## 2014-07-10 MED ORDER — CYANOCOBALAMIN 1000 MCG/ML IJ SOLN
1000.0000 ug | Freq: Once | INTRAMUSCULAR | Status: AC
  Administered 2014-07-10: 1000 ug via INTRAMUSCULAR

## 2014-07-10 NOTE — Progress Notes (Signed)
Patient presents today for B12 injection. VS Stable Patient tolerated injection well 

## 2014-08-11 ENCOUNTER — Ambulatory Visit (INDEPENDENT_AMBULATORY_CARE_PROVIDER_SITE_OTHER): Payer: Medicare Other | Admitting: Internal Medicine

## 2014-08-11 VITALS — BP 120/64 | HR 70 | Temp 97.9°F

## 2014-08-11 DIAGNOSIS — E539 Vitamin B deficiency, unspecified: Secondary | ICD-10-CM | POA: Diagnosis not present

## 2014-08-11 MED ORDER — CYANOCOBALAMIN 1000 MCG/ML IJ SOLN
1000.0000 ug | Freq: Once | INTRAMUSCULAR | Status: AC
  Administered 2014-08-11: 1000 ug via INTRAMUSCULAR

## 2014-08-11 NOTE — Progress Notes (Signed)
Patient presents today for B12 injection. VS stable. Patient tolerated injection well 

## 2014-08-13 ENCOUNTER — Other Ambulatory Visit: Payer: Self-pay | Admitting: Internal Medicine

## 2014-08-13 NOTE — Telephone Encounter (Signed)
Refill x 6 months 

## 2014-08-17 ENCOUNTER — Emergency Department (HOSPITAL_COMMUNITY): Payer: Medicare Other

## 2014-08-17 ENCOUNTER — Inpatient Hospital Stay (HOSPITAL_COMMUNITY)
Admission: EM | Admit: 2014-08-17 | Discharge: 2014-08-20 | DRG: 470 | Disposition: A | Discharge status: Home Health | Payer: Medicare Other | Attending: Internal Medicine | Admitting: Internal Medicine

## 2014-08-17 ENCOUNTER — Encounter (HOSPITAL_COMMUNITY): Payer: Self-pay | Admitting: Emergency Medicine

## 2014-08-17 DIAGNOSIS — M79671 Pain in right foot: Secondary | ICD-10-CM | POA: Diagnosis not present

## 2014-08-17 DIAGNOSIS — M797 Fibromyalgia: Secondary | ICD-10-CM | POA: Diagnosis present

## 2014-08-17 DIAGNOSIS — M25559 Pain in unspecified hip: Secondary | ICD-10-CM | POA: Diagnosis not present

## 2014-08-17 DIAGNOSIS — N319 Neuromuscular dysfunction of bladder, unspecified: Secondary | ICD-10-CM | POA: Diagnosis present

## 2014-08-17 DIAGNOSIS — G35 Multiple sclerosis: Secondary | ICD-10-CM | POA: Diagnosis not present

## 2014-08-17 DIAGNOSIS — Z820 Family history of epilepsy and other diseases of the nervous system: Secondary | ICD-10-CM | POA: Diagnosis not present

## 2014-08-17 DIAGNOSIS — S72011A Unspecified intracapsular fracture of right femur, initial encounter for closed fracture: Principal | ICD-10-CM | POA: Diagnosis present

## 2014-08-17 DIAGNOSIS — Z800 Family history of malignant neoplasm of digestive organs: Secondary | ICD-10-CM | POA: Diagnosis not present

## 2014-08-17 DIAGNOSIS — K589 Irritable bowel syndrome without diarrhea: Secondary | ICD-10-CM | POA: Diagnosis present

## 2014-08-17 DIAGNOSIS — Z79899 Other long term (current) drug therapy: Secondary | ICD-10-CM

## 2014-08-17 DIAGNOSIS — Z833 Family history of diabetes mellitus: Secondary | ICD-10-CM

## 2014-08-17 DIAGNOSIS — M8580 Other specified disorders of bone density and structure, unspecified site: Secondary | ICD-10-CM | POA: Diagnosis present

## 2014-08-17 DIAGNOSIS — W19XXXA Unspecified fall, initial encounter: Secondary | ICD-10-CM

## 2014-08-17 DIAGNOSIS — Z8249 Family history of ischemic heart disease and other diseases of the circulatory system: Secondary | ICD-10-CM

## 2014-08-17 DIAGNOSIS — D62 Acute posthemorrhagic anemia: Secondary | ICD-10-CM | POA: Diagnosis not present

## 2014-08-17 DIAGNOSIS — Z09 Encounter for follow-up examination after completed treatment for conditions other than malignant neoplasm: Secondary | ICD-10-CM

## 2014-08-17 DIAGNOSIS — S72001A Fracture of unspecified part of neck of right femur, initial encounter for closed fracture: Secondary | ICD-10-CM | POA: Diagnosis not present

## 2014-08-17 DIAGNOSIS — S72041A Displaced fracture of base of neck of right femur, initial encounter for closed fracture: Secondary | ICD-10-CM | POA: Diagnosis not present

## 2014-08-17 DIAGNOSIS — M79604 Pain in right leg: Secondary | ICD-10-CM | POA: Diagnosis not present

## 2014-08-17 DIAGNOSIS — M25551 Pain in right hip: Secondary | ICD-10-CM | POA: Diagnosis not present

## 2014-08-17 DIAGNOSIS — S99921A Unspecified injury of right foot, initial encounter: Secondary | ICD-10-CM | POA: Diagnosis not present

## 2014-08-17 DIAGNOSIS — M810 Age-related osteoporosis without current pathological fracture: Secondary | ICD-10-CM | POA: Diagnosis present

## 2014-08-17 DIAGNOSIS — J45909 Unspecified asthma, uncomplicated: Secondary | ICD-10-CM | POA: Diagnosis present

## 2014-08-17 DIAGNOSIS — Z85028 Personal history of other malignant neoplasm of stomach: Secondary | ICD-10-CM

## 2014-08-17 DIAGNOSIS — Y92019 Unspecified place in single-family (private) house as the place of occurrence of the external cause: Secondary | ICD-10-CM | POA: Diagnosis not present

## 2014-08-17 DIAGNOSIS — W010XXA Fall on same level from slipping, tripping and stumbling without subsequent striking against object, initial encounter: Secondary | ICD-10-CM | POA: Diagnosis present

## 2014-08-17 DIAGNOSIS — S3992XA Unspecified injury of lower back, initial encounter: Secondary | ICD-10-CM | POA: Diagnosis not present

## 2014-08-17 DIAGNOSIS — S8991XA Unspecified injury of right lower leg, initial encounter: Secondary | ICD-10-CM | POA: Diagnosis not present

## 2014-08-17 DIAGNOSIS — Z471 Aftercare following joint replacement surgery: Secondary | ICD-10-CM | POA: Diagnosis not present

## 2014-08-17 DIAGNOSIS — D519 Vitamin B12 deficiency anemia, unspecified: Secondary | ICD-10-CM | POA: Diagnosis present

## 2014-08-17 DIAGNOSIS — S72009A Fracture of unspecified part of neck of unspecified femur, initial encounter for closed fracture: Secondary | ICD-10-CM | POA: Diagnosis present

## 2014-08-17 DIAGNOSIS — Z903 Acquired absence of stomach [part of]: Secondary | ICD-10-CM | POA: Diagnosis present

## 2014-08-17 DIAGNOSIS — E039 Hypothyroidism, unspecified: Secondary | ICD-10-CM | POA: Diagnosis present

## 2014-08-17 DIAGNOSIS — Z96641 Presence of right artificial hip joint: Secondary | ICD-10-CM | POA: Diagnosis not present

## 2014-08-17 DIAGNOSIS — R52 Pain, unspecified: Secondary | ICD-10-CM | POA: Diagnosis not present

## 2014-08-17 DIAGNOSIS — M1990 Unspecified osteoarthritis, unspecified site: Secondary | ICD-10-CM | POA: Diagnosis not present

## 2014-08-17 DIAGNOSIS — Z96612 Presence of left artificial shoulder joint: Secondary | ICD-10-CM | POA: Diagnosis present

## 2014-08-17 DIAGNOSIS — K219 Gastro-esophageal reflux disease without esophagitis: Secondary | ICD-10-CM | POA: Diagnosis present

## 2014-08-17 DIAGNOSIS — G35D Multiple sclerosis, unspecified: Secondary | ICD-10-CM | POA: Diagnosis present

## 2014-08-17 DIAGNOSIS — S299XXA Unspecified injury of thorax, initial encounter: Secondary | ICD-10-CM | POA: Diagnosis not present

## 2014-08-17 DIAGNOSIS — S79911A Unspecified injury of right hip, initial encounter: Secondary | ICD-10-CM | POA: Diagnosis not present

## 2014-08-17 LAB — COMPREHENSIVE METABOLIC PANEL
ALT: 20 U/L (ref 14–54)
AST: 30 U/L (ref 15–41)
Albumin: 3.7 g/dL (ref 3.5–5.0)
Alkaline Phosphatase: 107 U/L (ref 38–126)
Anion gap: 8 (ref 5–15)
BUN: 13 mg/dL (ref 6–20)
CO2: 25 mmol/L (ref 22–32)
CREATININE: 1.1 mg/dL — AB (ref 0.44–1.00)
Calcium: 9 mg/dL (ref 8.9–10.3)
Chloride: 106 mmol/L (ref 101–111)
GFR calc Af Amer: 59 mL/min — ABNORMAL LOW (ref >60)
GFR calc non Af Amer: 51 mL/min — ABNORMAL LOW (ref >60)
GLUCOSE: 103 mg/dL — AB (ref 65–99)
Potassium: 3.8 mmol/L (ref 3.5–5.1)
SODIUM: 139 mmol/L (ref 135–145)
Total Bilirubin: 0.9 mg/dL (ref 0.3–1.2)
Total Protein: 7.2 g/dL (ref 6.5–8.1)

## 2014-08-17 LAB — PROTIME-INR
INR: 1.14 (ref 0.00–1.49)
PROTHROMBIN TIME: 14.8 s (ref 11.6–15.2)

## 2014-08-17 LAB — CBC WITH DIFFERENTIAL/PLATELET
BASOS ABS: 0 10*3/uL (ref 0.0–0.1)
Basophils Relative: 0 % (ref 0–1)
EOS ABS: 0 10*3/uL (ref 0.0–0.7)
EOS PCT: 0 % (ref 0–5)
HCT: 36.4 % (ref 36.0–46.0)
HEMOGLOBIN: 11.7 g/dL — AB (ref 12.0–15.0)
LYMPHS PCT: 11 % — AB (ref 12–46)
Lymphs Abs: 0.9 10*3/uL (ref 0.7–4.0)
MCH: 28.9 pg (ref 26.0–34.0)
MCHC: 32.1 g/dL (ref 30.0–36.0)
MCV: 89.9 fL (ref 78.0–100.0)
Monocytes Absolute: 0.4 10*3/uL (ref 0.1–1.0)
Monocytes Relative: 6 % (ref 3–12)
NEUTROS PCT: 83 % — AB (ref 43–77)
Neutro Abs: 6.6 10*3/uL (ref 1.7–7.7)
PLATELETS: 139 10*3/uL — AB (ref 150–400)
RBC: 4.05 MIL/uL (ref 3.87–5.11)
RDW: 12.7 % (ref 11.5–15.5)
WBC: 8 10*3/uL (ref 4.0–10.5)

## 2014-08-17 LAB — ALBUMIN: Albumin: 3.5 g/dL (ref 3.5–5.0)

## 2014-08-17 MED ORDER — MORPHINE SULFATE (PF) 2 MG/ML IV SOLN
0.5000 mg | INTRAVENOUS | Status: DC | PRN
  Administered 2014-08-18 (×2): 0.5 mg via INTRAVENOUS
  Filled 2014-08-17 (×2): qty 1

## 2014-08-17 MED ORDER — DEXTROSE 5 % IV SOLN
500.0000 mg | Freq: Four times a day (QID) | INTRAVENOUS | Status: DC | PRN
  Filled 2014-08-17 (×2): qty 5

## 2014-08-17 MED ORDER — METHOCARBAMOL 500 MG PO TABS
500.0000 mg | ORAL_TABLET | Freq: Four times a day (QID) | ORAL | Status: DC | PRN
  Administered 2014-08-18: 500 mg via ORAL
  Filled 2014-08-17: qty 1

## 2014-08-17 MED ORDER — HYDROCODONE-ACETAMINOPHEN 5-325 MG PO TABS
1.0000 | ORAL_TABLET | Freq: Once | ORAL | Status: AC
  Administered 2014-08-17: 1 via ORAL
  Filled 2014-08-17: qty 1

## 2014-08-17 MED ORDER — HYDROMORPHONE HCL 1 MG/ML IJ SOLN
1.0000 mg | Freq: Once | INTRAMUSCULAR | Status: AC
  Administered 2014-08-17: 1 mg via INTRAMUSCULAR
  Filled 2014-08-17: qty 1

## 2014-08-17 MED ORDER — HYDROCODONE-ACETAMINOPHEN 5-325 MG PO TABS: 2.0000 | ORAL_TABLET | Freq: Once | ORAL | Status: DC

## 2014-08-17 MED ORDER — SENNOSIDES-DOCUSATE SODIUM 8.6-50 MG PO TABS: 1.0000 | ORAL_TABLET | Freq: Every evening | ORAL | Status: DC | PRN

## 2014-08-17 MED ORDER — HYDROCODONE-ACETAMINOPHEN 5-325 MG PO TABS
1.0000 | ORAL_TABLET | Freq: Four times a day (QID) | ORAL | Status: DC | PRN
  Administered 2014-08-17: 2 via ORAL
  Filled 2014-08-17: qty 2

## 2014-08-17 MED ORDER — PANTOPRAZOLE SODIUM 40 MG PO TBEC
40.0000 mg | DELAYED_RELEASE_TABLET | Freq: Every day | ORAL | Status: DC
  Administered 2014-08-19 – 2014-08-20 (×2): 40 mg via ORAL
  Filled 2014-08-17 (×4): qty 1

## 2014-08-17 MED ORDER — PREGABALIN 50 MG PO CAPS
50.0000 mg | ORAL_CAPSULE | Freq: Three times a day (TID) | ORAL | Status: DC
  Administered 2014-08-18 – 2014-08-20 (×5): 50 mg via ORAL
  Filled 2014-08-17 (×5): qty 1

## 2014-08-17 MED ORDER — FLEET ENEMA 7-19 GM/118ML RE ENEM: 1.0000 | ENEMA | Freq: Once | RECTAL | Status: DC | PRN

## 2014-08-17 MED ORDER — DIAZEPAM 2 MG PO TABS
2.0000 mg | ORAL_TABLET | Freq: Once | ORAL | Status: AC
Start: 2014-08-17 — End: 2014-08-17
  Administered 2014-08-17: 2 mg via ORAL
  Filled 2014-08-17: qty 1

## 2014-08-17 MED ORDER — SODIUM CHLORIDE 0.9 % IV SOLN
INTRAVENOUS | Status: DC
  Administered 2014-08-17: 22:00:00 via INTRAVENOUS

## 2014-08-17 MED ORDER — BISACODYL 10 MG RE SUPP: 10.0000 mg | Freq: Every day | RECTAL | Status: DC | PRN

## 2014-08-17 NOTE — ED Provider Notes (Signed)
CSN: [id number]     Arrival date & time 08/17/14  1433 History   First MD Initiated Contact with Patient 08/17/14 1504     Chief Complaint  Patient presents with  . Fall  . Hip Pain  . Ankle Pain     (Consider location/radiation/quality/duration/timing/severity/associated sxs/prior Treatment) HPI  Moving cabinet, slipped on water fell on right side and back and having low back pain, hip pain, femur pain and ankle pain that is sharp, dull, throbbing (feels like a 'charlie horse') all on same side just prior to arrival here. No h/o broken bones, no other associated symptoms. Has not tried anything for alleviation. Exacerbated by walking.   Past Medical History  Diagnosis Date  . MS (multiple sclerosis) dx'd ~ 1998  . Osteopenia   . IBS (irritable bowel syndrome)   . Neurogenic bladder   . Stomach cancer 2001  . Asthma   . History of blood transfusion 2001    "when I had my stomach cancer"  . GERD (gastroesophageal reflux disease)   . Thyroid nodule     right  . Hypothyroidism   . Arthritis     "right shoulder" (10/09/2013)  . Osteoporosis   . Fibromyalgia   . B12 deficiency anemia    Past Surgical History  Procedure Laterality Date  . Partial gastrectomy  2001  . Cesarean section  1975; 1984  . Tonsillectomy  ~ 1959  . Joint replacement    . Omentectomy  2001    partial  . Total shoulder arthroplasty Left 2011  . Biopsy thyroid Right 1990's   Family History  Problem Relation Age of Onset  . Heart disease Mother   . Diabetes Father   . Stomach cancer Brother   . Multiple sclerosis Other    Social History  Substance Use Topics  . Smoking status: Never Smoker   . Smokeless tobacco: Never Used     Comment: NEVER USED TOBACCO  . Alcohol Use: No   OB History    No data available     Review of Systems  Musculoskeletal: Positive for myalgias (right leg) and back pain.  Neurological: Negative for seizures, syncope and headaches.  All other systems reviewed and  are negative.     Allergies  Review of patient's allergies indicates no known allergies.  Home Medications   Prior to Admission medications   Medication Sig Start Date End Date Taking? Authorizing Provider  albuterol (PROVENTIL HFA;VENTOLIN HFA) 108 (90 BASE) MCG/ACT inhaler Inhale 2 puffs into the lungs every 6 (six) hours as needed for wheezing or shortness of breath.   Yes Historical Provider, MD  cyanocobalamin (,VITAMIN B-12,) 1000 MCG/ML injection Inject 1,000 mcg into the muscle every 30 (thirty) days.   Yes Historical Provider, MD  LYRICA 50 MG capsule TAKE ONE CAPSULE BY MOUTH 3 TIMES A DAY 08/13/14  Yes Elby Showers, MD  pantoprazole (PROTONIX) 40 MG tablet TAKE 1 TABLET (40 MG TOTAL) BY MOUTH DAILY. 06/10/14  Yes Elby Showers, MD  ergocalciferol (VITAMIN D2) 50000 UNITS capsule Take weekly x 12 weeks then take 2000 units Vitamin D3 daily after completing 12 weeks course. Patient not taking: Reported on 08/17/2014 04/13/14   Elby Showers, MD   BP 148/83 mmHg  Pulse 66  Temp(Src) 98.1 F (36.7 C) (Oral)  Resp 15  SpO2 97% Physical Exam  Constitutional: She is oriented to person, place, and time. She appears well-developed and well-nourished.  HENT:  Head: Normocephalic and atraumatic.  Eyes:  Conjunctivae and EOM are normal. Right eye exhibits no discharge. Left eye exhibits no discharge.  Cardiovascular: Normal rate and regular rhythm.   Pulmonary/Chest: Effort normal and breath sounds normal. No respiratory distress.  Abdominal: Soft. She exhibits no distension. There is no tenderness. There is no rebound.  Musculoskeletal: Normal range of motion. She exhibits tenderness (throughout RLE but worse around ankle and femur). She exhibits no edema.  Neurological: She is alert and oriented to person, place, and time.  Skin: Skin is warm and dry.  Nursing note and vitals reviewed.   ED Course  Procedures (including critical care time) Labs Review Labs Reviewed  CBC WITH  DIFFERENTIAL/PLATELET - Abnormal; Notable for the following:    Hemoglobin 11.7 (*)    Platelets 139 (*)    Neutrophils Relative % 83 (*)    Lymphocytes Relative 11 (*)    All other components within normal limits  COMPREHENSIVE METABOLIC PANEL - Abnormal; Notable for the following:    Glucose, Bld 103 (*)    Creatinine, Ser 1.10 (*)    GFR calc non Af Amer 51 (*)    GFR calc Af Amer 59 (*)    All other components within normal limits    Imaging Review Dg Chest 1 View  08/17/2014   CLINICAL DATA:  Fall today. History of stomach cancer and asthma. Initial encounter.  EXAM: CHEST  1 VIEW  COMPARISON:  Radiographs dating back to 04/07/2013. Chest CT 04/11/2013.  FINDINGS: Chronic RIGHT pleural apical scarring. Hyperinflation compatible with emphysema. Cardiopericardial silhouette appears within normal limits. Aortic arch atherosclerosis. LEFT shoulder arthroplasty. RIGHT shoulder glenohumeral osteoarthritis.  IMPRESSION: No acute cardiopulmonary disease. Chronic bilateral RIGHT-greater-than- LEFT pleural apical scarring.   Electronically Signed   By: Dereck Ligas M.D.   On: 08/17/2014 17:41   Dg Lumbar Spine Complete  08/17/2014   CLINICAL DATA:  Status post fall. Difficulty straightening the a right leg.  EXAM: LUMBAR SPINE - COMPLETE 4+ VIEW  COMPARISON:  None.  FINDINGS: There is no evidence of lumbar spine fracture. Alignment is normal. Degenerative disc disease at L5-S1. Remainder the disc heights are maintained. There is generalized osteopenia.  IMPRESSION: No acute osseous injury of the lumbar spine.   Electronically Signed   By: Kathreen Devoid   On: 08/17/2014 17:05   Dg Tibia/fibula Right  08/17/2014   CLINICAL DATA:  Patient tripped and fell at home with right leg pain today  EXAM: RIGHT TIBIA AND FIBULA - 2 VIEW  COMPARISON:  None.  FINDINGS: There is no evidence of fracture or other focal bone lesions. Soft tissues are unremarkable.  IMPRESSION: Negative.   Electronically Signed   By:  Skipper Cliche M.D.   On: 08/17/2014 17:01   Dg Foot Complete Right  08/17/2014   CLINICAL DATA:  Per EMS- pt was pushing around a filing cabinet in her house. At some point tripped and fell on her right hip. C/o right hip. Difficulty straightening right leg to full extension.low back pain pain. Right hip down to foot pain since fall today. Best obtainable images due to pt condition and pt in severe pain.  EXAM: RIGHT FOOT COMPLETE - 3+ VIEW  COMPARISON:  None.  FINDINGS: No fracture. Joints are normally spaced and aligned. Soft tissues are unremarkable.  IMPRESSION: Negative.   Electronically Signed   By: Lajean Manes M.D.   On: 08/17/2014 17:01   Dg Hip Unilat With Pelvis 2-3 Views Right  08/17/2014   CLINICAL DATA:  Status post  fall today striking the right hip, persistent hip pain and difficulty straightening the leg to full extension  EXAM: DG HIP (WITH OR WITHOUT PELVIS) 2-3V RIGHT  COMPARISON:  A abdominal pelvic radiograph of December 02, 2013  FINDINGS: The patient has sustained an impacted subcapital fracture of the right femur. The intertrochanteric and subtrochanteric regions are normal. There is mild angulation at the fracture site. The observed portions of the right hemipelvis are normal.  IMPRESSION: The patient has sustained an acute impacted angulated subcapital fracture of the right femur.   Electronically Signed   By: David  Martinique M.D.   On: 08/17/2014 16:59   Dg Femur, Min 2 Views Right  08/17/2014   CLINICAL DATA:  Per EMS- pt was pushing around a filing cabinet in her house. At some point tripped and fell on her right hip. C/o right hip. Difficulty straightening right leg to full extension.low back pain pain. Right hip down to foot pain since fall today. Best obtainable images due to pt condition and pt in severe pain.  EXAM: RIGHT FEMUR 2 VIEWS  COMPARISON:  None.  FINDINGS: There is a right femoral neck fracture, which is displaced, with the shaft fracture component displacing  superiorly by approximately 17 mm. There is no significant comminution. Varus angulation is noted.  No additional fractures. Hip and knee joints are normally aligned. Bones are diffusely demineralized.  IMPRESSION: Fracture right femoral neck, displaced and with varus angulation.   Electronically Signed   By: Lajean Manes M.D.   On: 08/17/2014 17:00   I, Yuvraj Pfeifer, Corene Cornea, personally reviewed and evaluated these images and lab results as part of my medical decision-making.   EKG Interpretation   Date/Time:  Monday August 17 2014 18:44:53 EDT Ventricular Rate:  80 PR Interval:  166 QRS Duration: 102 QT Interval:  401 QTC Calculation: 463 R Axis:   87 Text Interpretation:  Sinus rhythm Biatrial enlargement Consider right  ventricular hypertrophy Confirmed by [facility] MD, Corene Cornea [phone]) on 08/17/2014  7:19:47 PM      MDM   Final diagnoses:  Fall   Likely muscular injury however was having difficulty stretching leg so will get XR. Will give valium and norco to help improve symptoms. Will dispo based on xr's and pain relief.  Pain relief not achieved, will give Dilaudid.  Found to have a subcapital hip fracture. Femoral neck fracture. And will admit for likely operative repair. Dr. Hessie Diener and will admit to medicine.       Merrily Pew, MD 08/17/14 [phone]

## 2014-08-17 NOTE — H&P (Signed)
PCP:  Elby Showers, MD  [facility]  Referring provider [place]   Chief Complaint:  Right hip pain  HPI: Erika Murphy is a 64 y.o. female   has a past medical history of MS (multiple sclerosis) (dx'd ~ 1998); Osteopenia; IBS (irritable bowel syndrome); Neurogenic bladder; Stomach cancer (2001); Asthma; History of blood transfusion (2001); GERD (gastroesophageal reflux disease); Thyroid nodule; Hypothyroidism; Arthritis; Osteoporosis; Fibromyalgia; and B12 deficiency anemia.   Presented with  At 1:30 PM patient was pushing a dressed when her foot slipped and she fell  And was unable to stand up. No head injury no LOC.  She called for help and family called EMS. On presentaion to ER she was found to have Right subcapital fracture with acute impacted angulation. Orthopedics consulted. ER provider spoke to Dr. Isidore Moos who was planning to operate in the near future made patient nothing by mouth She denies any cardiac issues. No chest pain or shortness of breath. No Fever. Patient has had trouble with neurogenic bladder states she has trouble feeling when she is needs to urinate. Patient has hx of MS and fibromyalgia and walks with a cane.   Hospitalist was called for admission for Right hip fracture  Review of Systems:    Pertinent positives include: right hip pain   Constitutional:  No weight loss, night sweats, Fevers, chills, fatigue, weight loss  HEENT:  No headaches, Difficulty swallowing,Tooth/dental problems,Sore throat,  No sneezing, itching, ear ache, nasal congestion, post nasal drip,  Cardio-vascular:  No chest pain, Orthopnea, PND, anasarca, dizziness, palpitations.no Bilateral lower extremity swelling  GI:  No heartburn, indigestion, abdominal pain, nausea, vomiting, diarrhea, change in bowel habits, loss of appetite, melena, blood in stool, hematemesis Resp:  no shortness of breath at rest. No dyspnea on exertion,  No excess mucus, no productive cough, No non-productive cough, No coughing up of blood.No change in color of mucus.No wheezing. Skin:  no rash or lesions. No jaundice GU:  no dysuria, change in color of urine, no urgency or frequency. No straining to urinate.  No flank pain.  Musculoskeletal:  No joint pain or no joint swelling. No decreased range of motion. No back pain.  Psych:  No change in mood or affect. No depression or anxiety. No memory loss.  Neuro: no localizing neurological complaints, no tingling, no weakness, no double vision, no gait abnormality, no slurred speech, no confusion  Otherwise ROS are negative except for above, 10 systems were reviewed  Past Medical History: Past Medical History  Diagnosis Date  . MS (multiple sclerosis) dx'd ~ 1998  . Osteopenia   . IBS (irritable bowel syndrome)   . Neurogenic bladder   . Stomach cancer 2001  . Asthma   . History of blood transfusion 2001    "when I had my stomach cancer"  . GERD (gastroesophageal reflux disease)   . Thyroid nodule     right  . Hypothyroidism   . Arthritis     "right shoulder" (10/09/2013)  . Osteoporosis   . Fibromyalgia   . B12 deficiency anemia    Past Surgical History  Procedure Laterality Date  . Partial gastrectomy  2001  . Cesarean section  1975; 1984  . Tonsillectomy  ~ 1959  . Joint replacement    . Omentectomy  2001    partial  . Total shoulder arthroplasty Left 2011  . Biopsy thyroid Right 1990's     Medications: Prior to Admission medications   Medication Sig  Start Date End Date Taking? Authorizing Provider  albuterol (PROVENTIL HFA;VENTOLIN HFA) 108 (90 BASE) MCG/ACT inhaler Inhale 2 puffs into the lungs every 6 (six) hours as needed for wheezing or shortness of breath.   Yes Historical Provider, MD  cyanocobalamin (,VITAMIN B-12,) 1000 MCG/ML injection Inject 1,000 mcg into the muscle every 30 (thirty) days.   Yes Historical Provider, MD  LYRICA 50 MG capsule TAKE ONE  CAPSULE BY MOUTH 3 TIMES A DAY 08/13/14  Yes Elby Showers, MD  pantoprazole (PROTONIX) 40 MG tablet TAKE 1 TABLET (40 MG TOTAL) BY MOUTH DAILY. 06/10/14  Yes Elby Showers, MD  ergocalciferol (VITAMIN D2) 50000 UNITS capsule Take weekly x 12 weeks then take 2000 units Vitamin D3 daily after completing 12 weeks course. Patient not taking: Reported on 08/17/2014 04/13/14   Elby Showers, MD    Allergies:  No Known Allergies  Social History:  Ambulatory   cane,   Lives at home alone,        reports that she has never smoked. She has never used smokeless tobacco. She reports that she does not drink alcohol or use illicit drugs.    Family History: family history includes Diabetes in her father; Heart disease in her mother; Multiple sclerosis in her other; Stomach cancer in her brother.    Physical Exam: Patient Vitals for the past 24 hrs:  BP Temp Temp src Pulse Resp SpO2  08/17/14 1653 148/83 mmHg - - 66 15 97 %  08/17/14 1441 158/72 mmHg 98.1 F (36.7 C) Oral 61 18 98 %    1. General:  in No Acute distress 2. Psychological: Alert and  Oriented 3. Head/ENT:   Moist   Mucous Membranes                          Head Non traumatic, neck supple                          Normal   Dentition 4. SKIN:  decreased Skin turgor,  Skin clean Dry and intact no rash 5. Heart: Regular rate and rhythm no Murmur, Rub or gallop 6. Lungs: Clear to auscultation bilaterally, no wheezes or crackles   7. Abdomen: Soft, non-tender, Non distended 8. Lower extremities: no clubbing, cyanosis, or edema right lower extremity shortening noted 9. Neurologically Grossly intact, moving all 4 extremities equally 10. MSK: Normal range of motion, diminished in right leg due to pain  body mass index is unknown because there is no weight on file.   Labs on Admission:   Results for orders placed or performed during the hospital encounter of 08/17/14 (from the past 24 hour(s))  CBC with Differential     Status:  Abnormal   Collection Time: 08/17/14  5:35 PM  Result Value Ref Range   WBC 8.0 4.0 - 10.5 K/uL   RBC 4.05 3.87 - 5.11 MIL/uL   Hemoglobin 11.7 (L) 12.0 - 15.0 g/dL   HCT 36.4 36.0 - 46.0 %   MCV 89.9 78.0 - 100.0 fL   MCH 28.9 26.0 - 34.0 pg   MCHC 32.1 30.0 - 36.0 g/dL   RDW 12.7 11.5 - 15.5 %   Platelets 139 (L) 150 - 400 K/uL   Neutrophils Relative % 83 (H) 43 - 77 %   Neutro Abs 6.6 1.7 - 7.7 K/uL   Lymphocytes Relative 11 (L) 12 - 46 %   Lymphs  Abs 0.9 0.7 - 4.0 K/uL   Monocytes Relative 6 3 - 12 %   Monocytes Absolute 0.4 0.1 - 1.0 K/uL   Eosinophils Relative 0 0 - 5 %   Eosinophils Absolute 0.0 0.0 - 0.7 K/uL   Basophils Relative 0 0 - 1 %   Basophils Absolute 0.0 0.0 - 0.1 K/uL  Comprehensive metabolic panel     Status: Abnormal   Collection Time: 08/17/14  5:35 PM  Result Value Ref Range   Sodium 139 135 - 145 mmol/L   Potassium 3.8 3.5 - 5.1 mmol/L   Chloride 106 101 - 111 mmol/L   CO2 25 22 - 32 mmol/L   Glucose, Bld 103 (H) 65 - 99 mg/dL   BUN 13 6 - 20 mg/dL   Creatinine, Ser 1.10 (H) 0.44 - 1.00 mg/dL   Calcium 9.0 8.9 - 10.3 mg/dL   Total Protein 7.2 6.5 - 8.1 g/dL   Albumin 3.7 3.5 - 5.0 g/dL   AST 30 15 - 41 U/L   ALT 20 14 - 54 U/L   Alkaline Phosphatase 107 38 - 126 U/L   Total Bilirubin 0.9 0.3 - 1.2 mg/dL   GFR calc non Af Amer 51 (L) >60 mL/min   GFR calc Af Amer 59 (L) >60 mL/min   Anion gap 8 5 - 15    UA not obtained  No results found for: HGBA1C  CrCl cannot be calculated (Unknown ideal weight.).  BNP (last 3 results) No results for input(s): PROBNP in the last 8760 hours.  Other results:  I have pearsonaly reviewed this: ECG REPORT  Rate: 80  Rhythm: NSR ST&T Change: no ischemic changes QTC 463   There were no vitals filed for this visit.   Cultures: No results found for: SDES, SPECREQUEST, CULT, REPTSTATUS   Radiological Exams on Admission: Dg Chest 1 View  08/17/2014   CLINICAL DATA:  Fall today. History of stomach  cancer and asthma. Initial encounter.  EXAM: CHEST  1 VIEW  COMPARISON:  Radiographs dating back to 04/07/2013. Chest CT 04/11/2013.  FINDINGS: Chronic RIGHT pleural apical scarring. Hyperinflation compatible with emphysema. Cardiopericardial silhouette appears within normal limits. Aortic arch atherosclerosis. LEFT shoulder arthroplasty. RIGHT shoulder glenohumeral osteoarthritis.  IMPRESSION: No acute cardiopulmonary disease. Chronic bilateral RIGHT-greater-than- LEFT pleural apical scarring.   Electronically Signed   By: Dereck Ligas M.D.   On: 08/17/2014 17:41   Dg Lumbar Spine Complete  08/17/2014   CLINICAL DATA:  Status post fall. Difficulty straightening the a right leg.  EXAM: LUMBAR SPINE - COMPLETE 4+ VIEW  COMPARISON:  None.  FINDINGS: There is no evidence of lumbar spine fracture. Alignment is normal. Degenerative disc disease at L5-S1. Remainder the disc heights are maintained. There is generalized osteopenia.  IMPRESSION: No acute osseous injury of the lumbar spine.   Electronically Signed   By: Kathreen Devoid   On: 08/17/2014 17:05   Dg Tibia/fibula Right  08/17/2014   CLINICAL DATA:  Patient tripped and fell at home with right leg pain today  EXAM: RIGHT TIBIA AND FIBULA - 2 VIEW  COMPARISON:  None.  FINDINGS: There is no evidence of fracture or other focal bone lesions. Soft tissues are unremarkable.  IMPRESSION: Negative.   Electronically Signed   By: Skipper Cliche M.D.   On: 08/17/2014 17:01   Dg Foot Complete Right  08/17/2014   CLINICAL DATA:  Per EMS- pt was pushing around a filing cabinet in her house. At some point tripped  and fell on her right hip. C/o right hip. Difficulty straightening right leg to full extension.low back pain pain. Right hip down to foot pain since fall today. Best obtainable images due to pt condition and pt in severe pain.  EXAM: RIGHT FOOT COMPLETE - 3+ VIEW  COMPARISON:  None.  FINDINGS: No fracture. Joints are normally spaced and aligned. Soft tissues  are unremarkable.  IMPRESSION: Negative.   Electronically Signed   By: Lajean Manes M.D.   On: 08/17/2014 17:01   Dg Hip Unilat With Pelvis 2-3 Views Right  08/17/2014   CLINICAL DATA:  Status post fall today striking the right hip, persistent hip pain and difficulty straightening the leg to full extension  EXAM: DG HIP (WITH OR WITHOUT PELVIS) 2-3V RIGHT  COMPARISON:  A abdominal pelvic radiograph of December 02, 2013  FINDINGS: The patient has sustained an impacted subcapital fracture of the right femur. The intertrochanteric and subtrochanteric regions are normal. There is mild angulation at the fracture site. The observed portions of the right hemipelvis are normal.  IMPRESSION: The patient has sustained an acute impacted angulated subcapital fracture of the right femur.   Electronically Signed   By: David  Martinique M.D.   On: 08/17/2014 16:59   Dg Femur, Min 2 Views Right  08/17/2014   CLINICAL DATA:  Per EMS- pt was pushing around a filing cabinet in her house. At some point tripped and fell on her right hip. C/o right hip. Difficulty straightening right leg to full extension.low back pain pain. Right hip down to foot pain since fall today. Best obtainable images due to pt condition and pt in severe pain.  EXAM: RIGHT FEMUR 2 VIEWS  COMPARISON:  None.  FINDINGS: There is a right femoral neck fracture, which is displaced, with the shaft fracture component displacing superiorly by approximately 17 mm. There is no significant comminution. Varus angulation is noted.  No additional fractures. Hip and knee joints are normally aligned. Bones are diffusely demineralized.  IMPRESSION: Fracture right femoral neck, displaced and with varus angulation.   Electronically Signed   By: Lajean Manes M.D.   On: 08/17/2014 17:00    Chart has been reviewed  Family  at  Bedside  plan of care was discussed with   Indiah Heyden [phone]  Assessment/Plan  64 year old female history of multiple sclerosis and  fibromyalgia presents with a conical fall resulting in right hip fracture. Dr. Veverly Fells with orthopedics is aware plan to operate in the near future   Present on Admission:   . Closed right hip fracture- admits per hip fracture protocol. Obtain type and screen. Nothing by mouth. Place Foley catheter,  Patient has no known cardiac problems EKG showed no evidence of acute ischemia. No further cardiac workup indicated prior to proceeding to OR. Still given patient's age she is low to moderate risk .  Marland Kitchen Multiple sclerosis -  patient at baseline walks with a walker likely will need placement for rehabilitation. Also order speech pathology evaluation to make sure patient does not have supbtle dysphasia   neurogenic bladder- will place a Foley. would recommend follow-up with urology   Prophylaxis: SCD    CODE STATUS:  FULL CODE as per patient    Disposition:  likely will need placement for rehabilitation          Other plan as per orders.  I have spent a total of 55 min on this admission  Alekzander Cardell 08/17/2014, 6:57 PM  [facility]  Pager  [phone]   after 2 AM please page floor coverage PA If 7AM-7PM, please contact the day team taking care of the patient  [email or web address]  Password [email or web address]

## 2014-08-17 NOTE — ED Notes (Signed)
Report called to [place] on 6E.  Will try to find MD Dout[place] and see if pt is ok to transport or if we are waiting on Ortho.

## 2014-08-17 NOTE — ED Notes (Signed)
Bed: XY33 Expected date:  Expected time:  Means of arrival:  Comments: EMS- 64yo F, fall, L hip/L ankle pain

## 2014-08-17 NOTE — Progress Notes (Signed)
CSW attempted to speak with patient at bedside. However, she has been transported to x-ray. Sister was present.  Sister confirms that the patient presents to [facility] due to falling. Sister stated " She was trying to move her dresser. She had her slippers on instead of her sneakers and she slipped and fell." Sister confirms that the patient's R hip is in pain.  Sister informed CSW that the patient currently lives with her daughter. However, she states that the patient's daughter was at work during the incident. According to sister, the patient is independent and completes ADL's with no assistance. She states that the patient does not fall often.  Sister states that the patient's primary support is her son.   Son/ Hanadi Stanly [phone] Sister/ [address] [phone]  Willette Brace [phone] ED CSW 08/17/2014 6:30 PM

## 2014-08-17 NOTE — ED Notes (Signed)
Per EMS- pt was pushing around a filing cabinet in her house. At some point tripped and fell on her right hip. C/o right hip and right ankle pain. Difficulty straightening right hip to full extension. Hx MS. Given 50 mcg Fentanyl in 18 G PIV in right UA. BP 150/80.

## 2014-08-17 NOTE — Progress Notes (Signed)
64 yr old female with hip/leg pain  CM consult for home health services Spoke with pt and her choice of home health agency is [facility] (states she previously used there services) Imaging acute impacted angulated subcapital fracture of the right femur, admitting for operative services  Pt may have to go stay with his son at the bedside   CM reviewed in details medicare guidelines, home health [facility]) (length of stay in home, types of [facility] staff available, coverage, primary caregiver, up to 24 hrs before services may be started) and Private duty nursing (PDN-coverage, length of stay in the home types of staff available). CM reviewed availability of [place] SW to assist pcp to get pt to snf (if desired disposition) from the community level. CM provided pt/family with a list of [place] home health agencies and PDN.   Discussed pt to be further evaluated by unit therapists (PT/OT) for recommendation of level of care and share this with attending MD and unit CM

## 2014-08-17 NOTE — ED Notes (Signed)
Patient transported to X-ray 

## 2014-08-17 NOTE — Consult Note (Signed)
Reason for Consult:Broken right hip Referring Physician: Skyelynn Rambeau is an 64 y.o. female.  HPI: 64 yo female s/p mechanical fall while moving furniture this afternoon.  Patient complained of immediate pain in the right groin down to the toes.  She was unable to stand or walk after the fall.  She has had some increased bilateral leg pain recently that she attributed to her MS condition.  When questioned more about pre-injury hip or groin pain she is not sure but does not recall any specific hip pain.  She ambulates well in the community with the use of a cane due to the MS.  Past Medical History  Diagnosis Date  . MS (multiple sclerosis) dx'd ~ 1998  . Osteopenia   . IBS (irritable bowel syndrome)   . Neurogenic bladder   . Stomach cancer 2001  . Asthma   . History of blood transfusion 2001    "when I had my stomach cancer"  . GERD (gastroesophageal reflux disease)   . Thyroid nodule     right  . Hypothyroidism   . Arthritis     "right shoulder" (10/09/2013)  . Osteoporosis   . Fibromyalgia   . B12 deficiency anemia     Past Surgical History  Procedure Laterality Date  . Partial gastrectomy  2001  . Cesarean section  1975; 1984  . Tonsillectomy  ~ 1959  . Joint replacement    . Omentectomy  2001    partial  . Total shoulder arthroplasty Left 2011  . Biopsy thyroid Right 1990's    Family History  Problem Relation Age of Onset  . Heart disease Mother   . Diabetes Father   . Stomach cancer Brother   . Multiple sclerosis Other     Social History:  reports that she has never smoked. She has never used smokeless tobacco. She reports that she does not drink alcohol or use illicit drugs.  Allergies: No Known Allergies  Medications: I have reviewed the patient's current medications.  Results for orders placed or performed during the hospital encounter of 08/17/14 (from the past 48 hour(s))  CBC with Differential     Status: Abnormal   Collection Time: 08/17/14   5:35 PM  Result Value Ref Range   WBC 8.0 4.0 - 10.5 K/uL   RBC 4.05 3.87 - 5.11 MIL/uL   Hemoglobin 11.7 (L) 12.0 - 15.0 g/dL   HCT 36.4 36.0 - 46.0 %   MCV 89.9 78.0 - 100.0 fL   MCH 28.9 26.0 - 34.0 pg   MCHC 32.1 30.0 - 36.0 g/dL   RDW 12.7 11.5 - 15.5 %   Platelets 139 (L) 150 - 400 K/uL   Neutrophils Relative % 83 (H) 43 - 77 %   Neutro Abs 6.6 1.7 - 7.7 K/uL   Lymphocytes Relative 11 (L) 12 - 46 %   Lymphs Abs 0.9 0.7 - 4.0 K/uL   Monocytes Relative 6 3 - 12 %   Monocytes Absolute 0.4 0.1 - 1.0 K/uL   Eosinophils Relative 0 0 - 5 %   Eosinophils Absolute 0.0 0.0 - 0.7 K/uL   Basophils Relative 0 0 - 1 %   Basophils Absolute 0.0 0.0 - 0.1 K/uL  Comprehensive metabolic panel     Status: Abnormal   Collection Time: 08/17/14  5:35 PM  Result Value Ref Range   Sodium 139 135 - 145 mmol/L   Potassium 3.8 3.5 - 5.1 mmol/L   Chloride 106 101 -  111 mmol/L   CO2 25 22 - 32 mmol/L   Glucose, Bld 103 (H) 65 - 99 mg/dL   BUN 13 6 - 20 mg/dL   Creatinine, Ser 1.10 (H) 0.44 - 1.00 mg/dL   Calcium 9.0 8.9 - 10.3 mg/dL   Total Protein 7.2 6.5 - 8.1 g/dL   Albumin 3.7 3.5 - 5.0 g/dL   AST 30 15 - 41 U/L   ALT 20 14 - 54 U/L   Alkaline Phosphatase 107 38 - 126 U/L   Total Bilirubin 0.9 0.3 - 1.2 mg/dL   GFR calc non Af Amer 51 (L) >60 mL/min   GFR calc Af Amer 59 (L) >60 mL/min    Comment: (NOTE) The eGFR has been calculated using the CKD EPI equation. This calculation has not been validated in all clinical situations. eGFR's persistently <60 mL/min signify possible Chronic Kidney Disease.    Anion gap 8 5 - 15    Dg Chest 1 View  08/17/2014   CLINICAL DATA:  Fall today. History of stomach cancer and asthma. Initial encounter.  EXAM: CHEST  1 VIEW  COMPARISON:  Radiographs dating back to 04/07/2013. Chest CT 04/11/2013.  FINDINGS: Chronic RIGHT pleural apical scarring. Hyperinflation compatible with emphysema. Cardiopericardial silhouette appears within normal limits. Aortic  arch atherosclerosis. LEFT shoulder arthroplasty. RIGHT shoulder glenohumeral osteoarthritis.  IMPRESSION: No acute cardiopulmonary disease. Chronic bilateral RIGHT-greater-than- LEFT pleural apical scarring.   Electronically Signed   By: Dereck Ligas M.D.   On: 08/17/2014 17:41   Dg Lumbar Spine Complete  08/17/2014   CLINICAL DATA:  Status post fall. Difficulty straightening the a right leg.  EXAM: LUMBAR SPINE - COMPLETE 4+ VIEW  COMPARISON:  None.  FINDINGS: There is no evidence of lumbar spine fracture. Alignment is normal. Degenerative disc disease at L5-S1. Remainder the disc heights are maintained. There is generalized osteopenia.  IMPRESSION: No acute osseous injury of the lumbar spine.   Electronically Signed   By: Kathreen Devoid   On: 08/17/2014 17:05   Dg Tibia/fibula Right  08/17/2014   CLINICAL DATA:  Patient tripped and fell at home with right leg pain today  EXAM: RIGHT TIBIA AND FIBULA - 2 VIEW  COMPARISON:  None.  FINDINGS: There is no evidence of fracture or other focal bone lesions. Soft tissues are unremarkable.  IMPRESSION: Negative.   Electronically Signed   By: Skipper Cliche M.D.   On: 08/17/2014 17:01   Dg Foot Complete Right  08/17/2014   CLINICAL DATA:  Per EMS- pt was pushing around a filing cabinet in her house. At some point tripped and fell on her right hip. C/o right hip. Difficulty straightening right leg to full extension.low back pain pain. Right hip down to foot pain since fall today. Best obtainable images due to pt condition and pt in severe pain.  EXAM: RIGHT FOOT COMPLETE - 3+ VIEW  COMPARISON:  None.  FINDINGS: No fracture. Joints are normally spaced and aligned. Soft tissues are unremarkable.  IMPRESSION: Negative.   Electronically Signed   By: Lajean Manes M.D.   On: 08/17/2014 17:01   Dg Hip Unilat With Pelvis 2-3 Views Right  08/17/2014   CLINICAL DATA:  Status post fall today striking the right hip, persistent hip pain and difficulty straightening the leg  to full extension  EXAM: DG HIP (WITH OR WITHOUT PELVIS) 2-3V RIGHT  COMPARISON:  A abdominal pelvic radiograph of December 02, 2013  FINDINGS: The patient has sustained an impacted subcapital fracture  of the right femur. The intertrochanteric and subtrochanteric regions are normal. There is mild angulation at the fracture site. The observed portions of the right hemipelvis are normal.  IMPRESSION: The patient has sustained an acute impacted angulated subcapital fracture of the right femur.   Electronically Signed   By: David  Martinique M.D.   On: 08/17/2014 16:59   Dg Femur, Min 2 Views Right  08/17/2014   CLINICAL DATA:  Per EMS- pt was pushing around a filing cabinet in her house. At some point tripped and fell on her right hip. C/o right hip. Difficulty straightening right leg to full extension.low back pain pain. Right hip down to foot pain since fall today. Best obtainable images due to pt condition and pt in severe pain.  EXAM: RIGHT FEMUR 2 VIEWS  COMPARISON:  None.  FINDINGS: There is a right femoral neck fracture, which is displaced, with the shaft fracture component displacing superiorly by approximately 17 mm. There is no significant comminution. Varus angulation is noted.  No additional fractures. Hip and knee joints are normally aligned. Bones are diffusely demineralized.  IMPRESSION: Fracture right femoral neck, displaced and with varus angulation.   Electronically Signed   By: Lajean Manes M.D.   On: 08/17/2014 17:00    ROS Blood pressure 155/72, pulse 83, temperature 99.1 F (37.3 C), temperature source Oral, resp. rate 20, SpO2 99 %. Physical Exam Healthy appearing female in mod distress.  Neck nontender, normal ROM, bilateral shoulders/elbows/wrists with 5/5 motor strength and no pain.  Sensation intact.  Left LE with pain free AROM, NVI Right LE slightly shortened and externally rotated, sensation intact and able to wiggle her toes and PF/DF her foot.  Assessment/Plan: Displaced  femoral neck fracture.  I have discussed Ms Erika Murphy's case with Dr Rod Can, a hip arthroplasty specialist, who has agreed to assume her orthopedic care.  He has posted the patient for surgery for tomorrow afternoon. Mechanical DVT prophylaxis, Foley, Analgesia Will let her have a regular diet until MN, then clear liquids until 5 AM, then NPO.  Thank you!  Kyren Vaux,STEVEN R 08/17/2014, 9:06 PM

## 2014-08-18 ENCOUNTER — Inpatient Hospital Stay (HOSPITAL_COMMUNITY): Payer: Medicare Other | Admitting: Anesthesiology

## 2014-08-18 ENCOUNTER — Encounter (HOSPITAL_COMMUNITY): Payer: Self-pay | Admitting: Registered Nurse

## 2014-08-18 ENCOUNTER — Encounter (HOSPITAL_COMMUNITY)
Admission: EM | Disposition: A | Discharge status: Home Health | Payer: Self-pay | Source: Home / Self Care | Attending: Internal Medicine

## 2014-08-18 ENCOUNTER — Inpatient Hospital Stay (HOSPITAL_COMMUNITY): Payer: Medicare Other

## 2014-08-18 DIAGNOSIS — S72001A Fracture of unspecified part of neck of right femur, initial encounter for closed fracture: Secondary | ICD-10-CM | POA: Diagnosis present

## 2014-08-18 HISTORY — PX: TOTAL HIP ARTHROPLASTY: SHX124

## 2014-08-18 LAB — BASIC METABOLIC PANEL
ANION GAP: 5 (ref 5–15)
BUN: 13 mg/dL (ref 6–20)
CALCIUM: 8.8 mg/dL — AB (ref 8.9–10.3)
CO2: 28 mmol/L (ref 22–32)
CREATININE: 0.94 mg/dL (ref 0.44–1.00)
Chloride: 107 mmol/L (ref 101–111)
Glucose, Bld: 114 mg/dL — ABNORMAL HIGH (ref 65–99)
Potassium: 4.1 mmol/L (ref 3.5–5.1)
SODIUM: 140 mmol/L (ref 135–145)

## 2014-08-18 LAB — CBC
HEMATOCRIT: 32.8 % — AB (ref 36.0–46.0)
Hemoglobin: 10.4 g/dL — ABNORMAL LOW (ref 12.0–15.0)
MCH: 28.3 pg (ref 26.0–34.0)
MCHC: 31.7 g/dL (ref 30.0–36.0)
MCV: 89.1 fL (ref 78.0–100.0)
PLATELETS: 125 10*3/uL — AB (ref 150–400)
RBC: 3.68 MIL/uL — ABNORMAL LOW (ref 3.87–5.11)
RDW: 12.7 % (ref 11.5–15.5)
WBC: 8.9 10*3/uL (ref 4.0–10.5)

## 2014-08-18 LAB — URINALYSIS, ROUTINE W REFLEX MICROSCOPIC
Bilirubin Urine: NEGATIVE
GLUCOSE, UA: NEGATIVE mg/dL
KETONES UR: 15 mg/dL — AB
LEUKOCYTES UA: NEGATIVE
Nitrite: NEGATIVE
PROTEIN: NEGATIVE mg/dL
Specific Gravity, Urine: 1.022 (ref 1.005–1.030)
UROBILINOGEN UA: 1 mg/dL (ref 0.0–1.0)
pH: 6 (ref 5.0–8.0)

## 2014-08-18 LAB — URINE MICROSCOPIC-ADD ON

## 2014-08-18 LAB — SURGICAL PCR SCREEN
MRSA, PCR: NEGATIVE
STAPHYLOCOCCUS AUREUS: NEGATIVE

## 2014-08-18 LAB — ABO/RH: ABO/RH(D): B POS

## 2014-08-18 SURGERY — ARTHROPLASTY, HIP, TOTAL, ANTERIOR APPROACH
Anesthesia: General | Site: Hip | Laterality: Right

## 2014-08-18 MED ORDER — MORPHINE SULFATE (PF) 2 MG/ML IV SOLN: 0.5000 mg | INTRAVENOUS | Status: DC | PRN

## 2014-08-18 MED ORDER — WATER FOR IRRIGATION, STERILE IR SOLN
Status: DC | PRN
  Administered 2014-08-18: 1000 mL

## 2014-08-18 MED ORDER — HYDROGEN PEROXIDE 3 % EX SOLN
CUTANEOUS | Status: DC | PRN
  Administered 2014-08-18: 1 via TOPICAL

## 2014-08-18 MED ORDER — MENTHOL 3 MG MT LOZG: 1.0000 | LOZENGE | OROMUCOSAL | Status: DC | PRN

## 2014-08-18 MED ORDER — PROPOFOL 10 MG/ML IV BOLUS
INTRAVENOUS | Status: DC | PRN
  Administered 2014-08-18: 30 mg via INTRAVENOUS
  Administered 2014-08-18: 140 mg via INTRAVENOUS

## 2014-08-18 MED ORDER — MIDAZOLAM HCL 2 MG/2ML IJ SOLN
INTRAMUSCULAR | Status: AC
  Filled 2014-08-18: qty 4

## 2014-08-18 MED ORDER — ACETAMINOPHEN 325 MG PO TABS: 650.0000 mg | ORAL_TABLET | Freq: Four times a day (QID) | ORAL | Status: DC | PRN

## 2014-08-18 MED ORDER — CHLORHEXIDINE GLUCONATE 4 % EX LIQD
Freq: Once | CUTANEOUS | Status: DC
  Filled 2014-08-18: qty 15

## 2014-08-18 MED ORDER — ACETAMINOPHEN 10 MG/ML IV SOLN
INTRAVENOUS | Status: AC
  Filled 2014-08-18: qty 100

## 2014-08-18 MED ORDER — ACETAMINOPHEN 10 MG/ML IV SOLN
INTRAVENOUS | Status: DC | PRN
  Administered 2014-08-18: 1000 mg via INTRAVENOUS

## 2014-08-18 MED ORDER — KETOROLAC TROMETHAMINE 30 MG/ML IJ SOLN
INTRAMUSCULAR | Status: AC
  Filled 2014-08-18: qty 1

## 2014-08-18 MED ORDER — CEFAZOLIN SODIUM-DEXTROSE 2-3 GM-% IV SOLR
INTRAVENOUS | Status: AC
  Filled 2014-08-18: qty 50

## 2014-08-18 MED ORDER — LACTATED RINGERS IV SOLN
INTRAVENOUS | Status: DC
  Administered 2014-08-18: 1000 mL via INTRAVENOUS

## 2014-08-18 MED ORDER — FENTANYL CITRATE (PF) 250 MCG/5ML IJ SOLN
INTRAMUSCULAR | Status: AC
  Filled 2014-08-18: qty 25

## 2014-08-18 MED ORDER — FENTANYL CITRATE (PF) 100 MCG/2ML IJ SOLN
INTRAMUSCULAR | Status: DC | PRN
  Administered 2014-08-18 (×6): 50 ug via INTRAVENOUS

## 2014-08-18 MED ORDER — METOCLOPRAMIDE HCL 5 MG/ML IJ SOLN: 5.0000 mg | Freq: Three times a day (TID) | INTRAMUSCULAR | Status: DC | PRN

## 2014-08-18 MED ORDER — TRANEXAMIC ACID 1000 MG/10ML IV SOLN
1000.0000 mg | INTRAVENOUS | Status: AC
  Administered 2014-08-18: 1000 mg via INTRAVENOUS
  Filled 2014-08-18 (×2): qty 10

## 2014-08-18 MED ORDER — KETOROLAC TROMETHAMINE 30 MG/ML IJ SOLN
INTRAMUSCULAR | Status: DC | PRN
  Administered 2014-08-18: 30 mg

## 2014-08-18 MED ORDER — HYDROMORPHONE HCL 1 MG/ML IJ SOLN: 0.2500 mg | INTRAMUSCULAR | Status: DC | PRN

## 2014-08-18 MED ORDER — METHOCARBAMOL 1000 MG/10ML IJ SOLN: 500.0000 mg | Freq: Four times a day (QID) | INTRAVENOUS | Status: DC | PRN

## 2014-08-18 MED ORDER — SUCCINYLCHOLINE CHLORIDE 20 MG/ML IJ SOLN
INTRAMUSCULAR | Status: DC | PRN
  Administered 2014-08-18: 80 mg via INTRAVENOUS

## 2014-08-18 MED ORDER — PROPOFOL 10 MG/ML IV BOLUS
INTRAVENOUS | Status: AC
  Filled 2014-08-18: qty 20

## 2014-08-18 MED ORDER — MIDAZOLAM HCL 5 MG/5ML IJ SOLN
INTRAMUSCULAR | Status: DC | PRN
  Administered 2014-08-18: 1 mg via INTRAVENOUS
  Administered 2014-08-18: 0.5 mg via INTRAVENOUS

## 2014-08-18 MED ORDER — CEFAZOLIN SODIUM-DEXTROSE 2-3 GM-% IV SOLR
2.0000 g | INTRAVENOUS | Status: AC
  Administered 2014-08-18: 2 g via INTRAVENOUS

## 2014-08-18 MED ORDER — METHOCARBAMOL 500 MG PO TABS: 500.0000 mg | ORAL_TABLET | Freq: Four times a day (QID) | ORAL | Status: DC | PRN

## 2014-08-18 MED ORDER — BUPIVACAINE-EPINEPHRINE (PF) 0.25% -1:200000 IJ SOLN
INTRAMUSCULAR | Status: AC
  Filled 2014-08-18: qty 30

## 2014-08-18 MED ORDER — ONDANSETRON HCL 4 MG/2ML IJ SOLN
INTRAMUSCULAR | Status: AC
  Filled 2014-08-18: qty 2

## 2014-08-18 MED ORDER — METOCLOPRAMIDE HCL 10 MG PO TABS: 5.0000 mg | ORAL_TABLET | Freq: Three times a day (TID) | ORAL | Status: DC | PRN

## 2014-08-18 MED ORDER — DEXAMETHASONE SODIUM PHOSPHATE 10 MG/ML IJ SOLN
INTRAMUSCULAR | Status: DC | PRN
  Administered 2014-08-18: 10 mg via INTRAVENOUS

## 2014-08-18 MED ORDER — LIDOCAINE HCL (CARDIAC) 20 MG/ML IV SOLN
INTRAVENOUS | Status: AC
  Filled 2014-08-18: qty 5

## 2014-08-18 MED ORDER — PHENOL 1.4 % MT LIQD: 1.0000 | OROMUCOSAL | Status: DC | PRN

## 2014-08-18 MED ORDER — LIDOCAINE HCL (CARDIAC) 20 MG/ML IV SOLN
INTRAVENOUS | Status: DC | PRN
  Administered 2014-08-18: 100 mg via INTRAVENOUS
  Administered 2014-08-18: 25 mg via INTRAVENOUS

## 2014-08-18 MED ORDER — CEFAZOLIN SODIUM-DEXTROSE 2-3 GM-% IV SOLR
2.0000 g | Freq: Four times a day (QID) | INTRAVENOUS | Status: AC
  Administered 2014-08-18 – 2014-08-19 (×2): 2 g via INTRAVENOUS
  Filled 2014-08-18 (×2): qty 50

## 2014-08-18 MED ORDER — ONDANSETRON HCL 4 MG/2ML IJ SOLN
INTRAMUSCULAR | Status: DC | PRN
  Administered 2014-08-18: 4 mg via INTRAVENOUS

## 2014-08-18 MED ORDER — HYDROCODONE-ACETAMINOPHEN 5-325 MG PO TABS
1.0000 | ORAL_TABLET | Freq: Four times a day (QID) | ORAL | Status: DC | PRN
  Administered 2014-08-19: 1 via ORAL
  Filled 2014-08-18: qty 1

## 2014-08-18 MED ORDER — CEFAZOLIN SODIUM-DEXTROSE 2-3 GM-% IV SOLR: 2.0000 g | INTRAVENOUS | Status: DC

## 2014-08-18 MED ORDER — ONDANSETRON HCL 4 MG/2ML IJ SOLN: 4.0000 mg | Freq: Four times a day (QID) | INTRAMUSCULAR | Status: DC | PRN

## 2014-08-18 MED ORDER — HYDROMORPHONE HCL 2 MG/ML IJ SOLN
INTRAMUSCULAR | Status: AC
  Filled 2014-08-18: qty 1

## 2014-08-18 MED ORDER — CHLORHEXIDINE GLUCONATE 4 % EX LIQD
1.0000 "application " | Freq: Once | CUTANEOUS | Status: DC
  Filled 2014-08-18: qty 15

## 2014-08-18 MED ORDER — LACTATED RINGERS IV SOLN
INTRAVENOUS | Status: DC | PRN
  Administered 2014-08-18 (×3): via INTRAVENOUS

## 2014-08-18 MED ORDER — ONDANSETRON HCL 4 MG PO TABS: 4.0000 mg | ORAL_TABLET | Freq: Four times a day (QID) | ORAL | Status: DC | PRN

## 2014-08-18 MED ORDER — SODIUM CHLORIDE 0.9 % IJ SOLN
INTRAMUSCULAR | Status: DC | PRN
  Administered 2014-08-18: 30 mL

## 2014-08-18 MED ORDER — DEXAMETHASONE SODIUM PHOSPHATE 10 MG/ML IJ SOLN
INTRAMUSCULAR | Status: AC
  Filled 2014-08-18: qty 1

## 2014-08-18 MED ORDER — LACTATED RINGERS IV SOLN: INTRAVENOUS | Status: DC

## 2014-08-18 MED ORDER — BUPIVACAINE-EPINEPHRINE 0.25% -1:200000 IJ SOLN
INTRAMUSCULAR | Status: DC | PRN
Start: 2014-08-18 — End: 2014-08-18
  Administered 2014-08-18: 20 mL

## 2014-08-18 MED ORDER — ISOPROPYL ALCOHOL 70 % SOLN
Status: AC
  Filled 2014-08-18: qty 480

## 2014-08-18 MED ORDER — HYDROGEN PEROXIDE 3 % EX SOLN
CUTANEOUS | Status: AC
  Filled 2014-08-18: qty 473

## 2014-08-18 MED ORDER — HYDROMORPHONE HCL 1 MG/ML IJ SOLN
0.2000 mg | INTRAMUSCULAR | Status: DC | PRN
  Administered 2014-08-18 (×2): 0.2 mg via INTRAVENOUS
  Administered 2014-08-18: 0.5 mg via INTRAVENOUS
  Filled 2014-08-18 (×2): qty 1

## 2014-08-18 MED ORDER — SODIUM CHLORIDE 0.9 % IR SOLN
Status: DC | PRN
  Administered 2014-08-18: 1000 mL

## 2014-08-18 MED ORDER — ROCURONIUM BROMIDE 100 MG/10ML IV SOLN
INTRAVENOUS | Status: DC | PRN
  Administered 2014-08-18: 5 mg via INTRAVENOUS

## 2014-08-18 MED ORDER — ACETAMINOPHEN 650 MG RE SUPP: 650.0000 mg | Freq: Four times a day (QID) | RECTAL | Status: DC | PRN

## 2014-08-18 MED ORDER — ISOPROPYL ALCOHOL 70 % SOLN
Status: DC | PRN
  Administered 2014-08-18: 1 via TOPICAL

## 2014-08-18 MED ORDER — ASPIRIN EC 325 MG PO TBEC
325.0000 mg | DELAYED_RELEASE_TABLET | Freq: Two times a day (BID) | ORAL | Status: DC
  Administered 2014-08-19 – 2014-08-20 (×3): 325 mg via ORAL
  Filled 2014-08-18 (×5): qty 1

## 2014-08-18 MED ORDER — SODIUM CHLORIDE 0.9 % IJ SOLN
INTRAMUSCULAR | Status: AC
  Filled 2014-08-18: qty 50

## 2014-08-18 MED ORDER — SODIUM CHLORIDE 0.9 % IV SOLN
INTRAVENOUS | Status: DC
Start: 2014-08-18 — End: 2014-08-20

## 2014-08-18 SURGICAL SUPPLY — 46 items
BAG DECANTER FOR FLEXI CONT (MISCELLANEOUS) IMPLANT
BAG ZIPLOCK 12X15 (MISCELLANEOUS) IMPLANT
CAPT HIP TOTAL 2 ×2 IMPLANT
CHLORAPREP W/TINT 26ML (MISCELLANEOUS) ×4 IMPLANT
COVER PERINEAL POST (MISCELLANEOUS) IMPLANT
DECANTER SPIKE VIAL GLASS SM (MISCELLANEOUS) ×2 IMPLANT
DRAPE C-ARM 42X120 X-RAY (DRAPES) ×2 IMPLANT
DRAPE STERI IOBAN 125X83 (DRAPES) ×2 IMPLANT
DRAPE U-SHAPE 47X51 STRL (DRAPES) ×6 IMPLANT
DRSG AQUACEL AG ADV 3.5X10 (GAUZE/BANDAGES/DRESSINGS) ×2 IMPLANT
ELECT BLADE TIP CTD 4 INCH (ELECTRODE) ×2 IMPLANT
ELECT PENCIL ROCKER SW 15FT (MISCELLANEOUS) ×2 IMPLANT
ELECT REM PT RETURN 15FT ADLT (MISCELLANEOUS) ×2 IMPLANT
FACESHIELD WRAPAROUND (MASK) ×2 IMPLANT
GAUZE SPONGE 4X4 12PLY STRL (GAUZE/BANDAGES/DRESSINGS) ×2 IMPLANT
GLOVE BIO SURGEON STRL SZ8.5 (GLOVE) ×4 IMPLANT
GLOVE BIOGEL PI IND STRL 8.5 (GLOVE) ×1 IMPLANT
GLOVE BIOGEL PI INDICATOR 8.5 (GLOVE) ×1
GOWN SPEC L3 XXLG W/TWL (GOWN DISPOSABLE) ×2 IMPLANT
HANDPIECE INTERPULSE COAX TIP (DISPOSABLE) ×1
HOLDER FOLEY CATH W/STRAP (MISCELLANEOUS) IMPLANT
HOOD PEEL AWAY FACE SHEILD DIS (HOOD) ×2 IMPLANT
KIT BASIN OR (CUSTOM PROCEDURE TRAY) ×2 IMPLANT
LIQUID BAND (GAUZE/BANDAGES/DRESSINGS) ×2 IMPLANT
NEEDLE SPNL 18GX3.5 QUINCKE PK (NEEDLE) ×2 IMPLANT
PACK TOTAL JOINT (CUSTOM PROCEDURE TRAY) ×2 IMPLANT
PEN SKIN MARKING BROAD (MISCELLANEOUS) ×2 IMPLANT
SAW OSC TIP CART 19.5X105X1.3 (SAW) ×2 IMPLANT
SEALER BIPOLAR AQUA 6.0 (INSTRUMENTS) ×2 IMPLANT
SET HNDPC FAN SPRY TIP SCT (DISPOSABLE) ×1 IMPLANT
SLEEVE SURGEON STRL (DRAPES) ×2 IMPLANT
SOL PREP POV-IOD 4OZ 10% (MISCELLANEOUS) ×2 IMPLANT
SUT ETHIBOND NAB CT1 #1 30IN (SUTURE) ×4 IMPLANT
SUT MNCRL AB 3-0 PS2 18 (SUTURE) ×2 IMPLANT
SUT MON AB 2-0 CT1 36 (SUTURE) ×4 IMPLANT
SUT VIC AB 1 CT1 36 (SUTURE) ×2 IMPLANT
SUT VIC AB 2-0 CT1 27 (SUTURE) ×1
SUT VIC AB 2-0 CT1 TAPERPNT 27 (SUTURE) ×1 IMPLANT
SUT VLOC 180 0 24IN GS25 (SUTURE) ×2 IMPLANT
SYR 50ML LL SCALE MARK (SYRINGE) ×2 IMPLANT
TOWEL OR 17X26 10 PK STRL BLUE (TOWEL DISPOSABLE) ×2 IMPLANT
TOWEL OR NON WOVEN STRL DISP B (DISPOSABLE) ×2 IMPLANT
TRAY FOLEY W/METER SILVER 14FR (SET/KITS/TRAYS/PACK) IMPLANT
TRAY FOLEY W/METER SILVER 16FR (SET/KITS/TRAYS/PACK) IMPLANT
WATER STERILE IRR 1500ML POUR (IV SOLUTION) ×2 IMPLANT
YANKAUER SUCT BULB TIP 10FT TU (MISCELLANEOUS) ×2 IMPLANT

## 2014-08-18 NOTE — Clinical Social Work Note (Signed)
Clinical Social Work Assessment  Patient Details  Name: Erika Murphy MRN: [id number] Date of Birth: 17-May-1950  Date of referral:  08/18/14               Reason for consult:  Facility Placement, Discharge Planning                Permission sought to share information with:  Facility Art therapist granted to share information::     Name::        Agency::     Relationship::     Contact Information:     Housing/Transportation Living arrangements for the past 2 months:  Single Family Home Source of Information:  Patient Patient Interpreter Needed:  None Criminal Activity/Legal Involvement Pertinent to Current Situation/Hospitalization:  No - Comment as needed Significant Relationships:  Adult Children Lives with:  Self Do you feel safe going back to the place where you live?  Yes Need for family participation in patient care:  No (Coment)  Care giving concerns:  Pt may need more care at home following hospital d/c than available.   Social Worker assessment / plan: Pt hospitalized on 08/17/14 with a displaced femoral neck fx. She is scheduled to have a right total hip arthroplasty today. Pt is part of the medicare bundle program. CSW met with pt to assist with d/c planning. Pt hopes to return home with assistance from her adult children and [facility] services at d/c. Pt will consider ST Rehab if recommended by PT and family is unable to provide support.  SNF search in [facility] initiated. Bed offers will be provided , if rehab is required. CSW will meet again with pt once surgery is completed and PT recommendations are available. RNCM from [place] updated.  Employment status:  Disabled (Comment on whether or not currently receiving Disability) Insurance information:  Medicare PT Recommendations:  Not assessed at this time Information / Referral to community resources:  [place]  Patient/Family's Response to care:  D/c plan is undetermined  at this time.  Patient/Family's Understanding of and Emotional Response to Diagnosis, Current Treatment, and Prognosis:  Pt is well aware of her medical status. She is looking forward to having her surgery completed. She is hopeful that she will be able to return home at d/c.  Emotional Assessment Appearance:  Appears stated age Attitude/Demeanor/Rapport:  Other (cooperative) Affect (typically observed):  Calm, Appropriate, Pleasant Orientation:  Oriented to Self, Oriented to Place, Oriented to  Time, Oriented to Situation Alcohol / Substance use:  Not Applicable Psych involvement (Current and /or in the community):  No (Comment)  Discharge Needs  Concerns to be addressed:  Discharge Planning Concerns Readmission within the last 30 days:  No Current discharge risk:  None Barriers to Discharge:  No Barriers Identified   Luretha Rued, [place] 08/18/2014, 1:05 PM

## 2014-08-18 NOTE — Anesthesia Postprocedure Evaluation (Signed)
  Anesthesia Post-op Note  Patient: Erika Murphy  Procedure(s) Performed: Procedure(s) (LRB): TOTAL HIP ARTHROPLASTY ANTERIOR APPROACH (Right)  Patient Location: PACU  Anesthesia Type: General  Level of Consciousness: awake and alert   Airway and Oxygen Therapy: Patient Spontanous Breathing  Post-op Pain: mild  Post-op Assessment: Post-op Vital signs reviewed, Patient's Cardiovascular Status Stable, Respiratory Function Stable, Patent Airway and No signs of Nausea or vomiting  Last Vitals:  Filed Vitals:   08/18/14 2043  BP: 168/59  Pulse: 79  Temp: 37 C  Resp: 18    Post-op Vital Signs: stable   Complications: No apparent anesthesia complications

## 2014-08-18 NOTE — Evaluation (Signed)
SLP Cancellation Note  Patient Details Name: Erika Murphy MRN: [id number] DOB: September 29, 1950   Cancelled treatment:       Reason Eval/Treat Not Completed: Other (comment) (pt npo for surgery this pm, will follow up as schedule allows)   Luanna Salk, [place] [facility] SLP [phone]

## 2014-08-18 NOTE — Anesthesia Procedure Notes (Signed)
Procedure Name: Intubation Date/Time: 08/18/2014 5:33 PM Performed by: Lissa Morales Pre-anesthesia Checklist: Patient identified, Emergency Drugs available, Suction available and Patient being monitored Patient Re-evaluated:Patient Re-evaluated prior to inductionOxygen Delivery Method: Circle System Utilized Preoxygenation: Pre-oxygenation with 100% oxygen Intubation Type: IV induction Ventilation: Mask ventilation without difficulty Laryngoscope Size: Mac and 4 Grade View: Grade II Tube type: Oral Number of attempts: 1 Airway Equipment and Method: Stylet and Oral airway Placement Confirmation: ETT inserted through vocal cords under direct vision,  positive ETCO2 and breath sounds checked- equal and bilateral Secured at: 21 cm Tube secured with: Tape Dental Injury: Teeth and Oropharynx as per pre-operative assessment

## 2014-08-18 NOTE — H&P (View-Only) (Signed)
Reason for Consult:Broken right hip Referring Physician: [name] is an 64 y.o. female.  HPI: 64 yo female s/p mechanical fall while moving furniture this afternoon.  Patient complained of immediate pain in the right groin down to the toes.  She was unable to stand or walk after the fall.  She has had some increased bilateral leg pain recently that she attributed to her MS condition.  When questioned more about pre-injury hip or groin pain she is not sure but does not recall any specific hip pain.  She ambulates well in the community with the use of a cane due to the MS.  Past Medical History  Diagnosis Date  . MS (multiple sclerosis) dx'd ~ 1998  . Osteopenia   . IBS (irritable bowel syndrome)   . Neurogenic bladder   . Stomach cancer 2001  . Asthma   . History of blood transfusion 2001    "when I had my stomach cancer"  . GERD (gastroesophageal reflux disease)   . Thyroid nodule     right  . Hypothyroidism   . Arthritis     "right shoulder" (10/09/2013)  . Osteoporosis   . Fibromyalgia   . B12 deficiency anemia     Past Surgical History  Procedure Laterality Date  . Partial gastrectomy  2001  . Cesarean section  1975; 1984  . Tonsillectomy  ~ 1959  . Joint replacement    . Omentectomy  2001    partial  . Total shoulder arthroplasty Left 2011  . Biopsy thyroid Right 1990's    Family History  Problem Relation Age of Onset  . Heart disease Mother   . Diabetes Father   . Stomach cancer Brother   . Multiple sclerosis Other     Social History:  reports that she has never smoked. She has never used smokeless tobacco. She reports that she does not drink alcohol or use illicit drugs.  Allergies: No Known Allergies  Medications: I have reviewed the patient's current medications.  Results for orders placed or performed during the hospital encounter of 08/17/14 (from the past 48 hour(s))  CBC with Differential     Status: Abnormal   Collection Time: 08/17/14   5:35 PM  Result Value Ref Range   WBC 8.0 4.0 - 10.5 K/uL   RBC 4.05 3.87 - 5.11 MIL/uL   Hemoglobin 11.7 (L) 12.0 - 15.0 g/dL   HCT 36.4 36.0 - 46.0 %   MCV 89.9 78.0 - 100.0 fL   MCH 28.9 26.0 - 34.0 pg   MCHC 32.1 30.0 - 36.0 g/dL   RDW 12.7 11.5 - 15.5 %   Platelets 139 (L) 150 - 400 K/uL   Neutrophils Relative % 83 (H) 43 - 77 %   Neutro Abs 6.6 1.7 - 7.7 K/uL   Lymphocytes Relative 11 (L) 12 - 46 %   Lymphs Abs 0.9 0.7 - 4.0 K/uL   Monocytes Relative 6 3 - 12 %   Monocytes Absolute 0.4 0.1 - 1.0 K/uL   Eosinophils Relative 0 0 - 5 %   Eosinophils Absolute 0.0 0.0 - 0.7 K/uL   Basophils Relative 0 0 - 1 %   Basophils Absolute 0.0 0.0 - 0.1 K/uL  Comprehensive metabolic panel     Status: Abnormal   Collection Time: 08/17/14  5:35 PM  Result Value Ref Range   Sodium 139 135 - 145 mmol/L   Potassium 3.8 3.5 - 5.1 mmol/L   Chloride 106 101 -  111 mmol/L   CO2 25 22 - 32 mmol/L   Glucose, Bld 103 (H) 65 - 99 mg/dL   BUN 13 6 - 20 mg/dL   Creatinine, Ser 1.10 (H) 0.44 - 1.00 mg/dL   Calcium 9.0 8.9 - 10.3 mg/dL   Total Protein 7.2 6.5 - 8.1 g/dL   Albumin 3.7 3.5 - 5.0 g/dL   AST 30 15 - 41 U/L   ALT 20 14 - 54 U/L   Alkaline Phosphatase 107 38 - 126 U/L   Total Bilirubin 0.9 0.3 - 1.2 mg/dL   GFR calc non Af Amer 51 (L) >60 mL/min   GFR calc Af Amer 59 (L) >60 mL/min    Comment: (NOTE) The eGFR has been calculated using the CKD EPI equation. This calculation has not been validated in all clinical situations. eGFR's persistently <60 mL/min signify possible Chronic Kidney Disease.    Anion gap 8 5 - 15    Dg Chest 1 View  08/17/2014   CLINICAL DATA:  Fall today. History of stomach cancer and asthma. Initial encounter.  EXAM: CHEST  1 VIEW  COMPARISON:  Radiographs dating back to 04/07/2013. Chest CT 04/11/2013.  FINDINGS: Chronic RIGHT pleural apical scarring. Hyperinflation compatible with emphysema. Cardiopericardial silhouette appears within normal limits. Aortic  arch atherosclerosis. LEFT shoulder arthroplasty. RIGHT shoulder glenohumeral osteoarthritis.  IMPRESSION: No acute cardiopulmonary disease. Chronic bilateral RIGHT-greater-than- LEFT pleural apical scarring.   Electronically Signed   By: Dereck Ligas M.D.   On: 08/17/2014 17:41   Dg Lumbar Spine Complete  08/17/2014   CLINICAL DATA:  Status post fall. Difficulty straightening the a right leg.  EXAM: LUMBAR SPINE - COMPLETE 4+ VIEW  COMPARISON:  None.  FINDINGS: There is no evidence of lumbar spine fracture. Alignment is normal. Degenerative disc disease at L5-S1. Remainder the disc heights are maintained. There is generalized osteopenia.  IMPRESSION: No acute osseous injury of the lumbar spine.   Electronically Signed   By: Kathreen Devoid   On: 08/17/2014 17:05   Dg Tibia/fibula Right  08/17/2014   CLINICAL DATA:  Patient tripped and fell at home with right leg pain today  EXAM: RIGHT TIBIA AND FIBULA - 2 VIEW  COMPARISON:  None.  FINDINGS: There is no evidence of fracture or other focal bone lesions. Soft tissues are unremarkable.  IMPRESSION: Negative.   Electronically Signed   By: Skipper Cliche M.D.   On: 08/17/2014 17:01   Dg Foot Complete Right  08/17/2014   CLINICAL DATA:  Per EMS- pt was pushing around a filing cabinet in her house. At some point tripped and fell on her right hip. C/o right hip. Difficulty straightening right leg to full extension.low back pain pain. Right hip down to foot pain since fall today. Best obtainable images due to pt condition and pt in severe pain.  EXAM: RIGHT FOOT COMPLETE - 3+ VIEW  COMPARISON:  None.  FINDINGS: No fracture. Joints are normally spaced and aligned. Soft tissues are unremarkable.  IMPRESSION: Negative.   Electronically Signed   By: Lajean Manes M.D.   On: 08/17/2014 17:01   Dg Hip Unilat With Pelvis 2-3 Views Right  08/17/2014   CLINICAL DATA:  Status post fall today striking the right hip, persistent hip pain and difficulty straightening the leg  to full extension  EXAM: DG HIP (WITH OR WITHOUT PELVIS) 2-3V RIGHT  COMPARISON:  A abdominal pelvic radiograph of December 02, 2013  FINDINGS: The patient has sustained an impacted subcapital fracture  of the right femur. The intertrochanteric and subtrochanteric regions are normal. There is mild angulation at the fracture site. The observed portions of the right hemipelvis are normal.  IMPRESSION: The patient has sustained an acute impacted angulated subcapital fracture of the right femur.   Electronically Signed   By: David  Martinique M.D.   On: 08/17/2014 16:59   Dg Femur, Min 2 Views Right  08/17/2014   CLINICAL DATA:  Per EMS- pt was pushing around a filing cabinet in her house. At some point tripped and fell on her right hip. C/o right hip. Difficulty straightening right leg to full extension.low back pain pain. Right hip down to foot pain since fall today. Best obtainable images due to pt condition and pt in severe pain.  EXAM: RIGHT FEMUR 2 VIEWS  COMPARISON:  None.  FINDINGS: There is a right femoral neck fracture, which is displaced, with the shaft fracture component displacing superiorly by approximately 17 mm. There is no significant comminution. Varus angulation is noted.  No additional fractures. Hip and knee joints are normally aligned. Bones are diffusely demineralized.  IMPRESSION: Fracture right femoral neck, displaced and with varus angulation.   Electronically Signed   By: Lajean Manes M.D.   On: 08/17/2014 17:00    ROS Blood pressure 155/72, pulse 83, temperature 99.1 F (37.3 C), temperature source Oral, resp. rate 20, SpO2 99 %. Physical Exam Healthy appearing female in mod distress.  Neck nontender, normal ROM, bilateral shoulders/elbows/wrists with 5/5 motor strength and no pain.  Sensation intact.  Left LE with pain free AROM, NVI Right LE slightly shortened and externally rotated, sensation intact and able to wiggle her toes and PF/DF her foot.  Assessment/Plan: Displaced  femoral neck fracture.  I have discussed Ms Sensabaugh's case with Dr Rod Can, a hip arthroplasty specialist, who has agreed to assume her orthopedic care.  He has posted the patient for surgery for tomorrow afternoon. Mechanical DVT prophylaxis, Foley, Analgesia Will let her have a regular diet until MN, then clear liquids until 5 AM, then NPO.  Thank you!  Nana Hoselton,STEVEN R 08/17/2014, 9:06 PM

## 2014-08-18 NOTE — Interval H&P Note (Signed)
History and Physical Interval Note:  08/18/2014 4:51 PM  Erika Murphy  has presented today for surgery, with the diagnosis of right hip femoral neck fracture  The various methods of treatment have been discussed with the patient and family. After consideration of risks, benefits and other options for treatment, the patient has consented to  Procedure(s): TOTAL HIP ARTHROPLASTY ANTERIOR APPROACH (Right) as a surgical intervention .  The patient's history has been reviewed, patient examined, no change in status, stable for surgery.  I have reviewed the patient's chart and labs.  Questions were answered to the patient's satisfaction.     Kimila Papaleo, Horald Pollen

## 2014-08-18 NOTE — Progress Notes (Signed)
Progress Note   LACOSTA HARGAN IZT:[id number] DOB: [birth year] DOA: 08/17/2014 PCP: Elby Showers, MD   Brief Narrative:   Erika Murphy is an 64 y.o. female with a PMH of multiple sclerosis, osteopenia, hypothyroidism and asthma who was admitted 08/17/14 after suffering from a mechanical fall resulting in a right subcapital fracture with acute impacted angulation. Orthopedic surgery was consulted and plans to operate.  Assessment/Plan:   Principal Problem:   Closed right hip fracture - For surgical repair today.  NPO. - Continue pain relief efforts.  Active Problems:   Multiple sclerosis - Ambulates with a walker at baseline. Will likely need SNF placement for rehabilitation.    Neurogenic bladder - Foley placed. We'll try to remove as soon as feasible.    DVT Prophylaxis - Lovenox ordered.  Family Communication: No family at the bedside.  Son is en route and was updated by orthopedic surgeon earlier today. Disposition Plan: Probable SNF placement for rehabilitation postoperatively, anticipate discharge in 2 days. Code Status:     Code Status Orders        Start     Ordered   08/17/14 2040  Full code   Continuous     08/17/14 2040        IV Access:    Peripheral IV   Procedures and diagnostic studies:   Dg Chest 1 View  08/17/2014   CLINICAL DATA:  Fall today. History of stomach cancer and asthma. Initial encounter.  EXAM: CHEST  1 VIEW  COMPARISON:  Radiographs dating back to 04/07/2013. Chest CT 04/11/2013.  FINDINGS: Chronic RIGHT pleural apical scarring. Hyperinflation compatible with emphysema. Cardiopericardial silhouette appears within normal limits. Aortic arch atherosclerosis. LEFT shoulder arthroplasty. RIGHT shoulder glenohumeral osteoarthritis.  IMPRESSION: No acute cardiopulmonary disease. Chronic bilateral RIGHT-greater-than- LEFT pleural apical scarring.   Electronically Signed   By: Dereck Ligas M.D.   On: 08/17/2014 17:41   Dg Lumbar Spine  Complete  08/17/2014   CLINICAL DATA:  Status post fall. Difficulty straightening the a right leg.  EXAM: LUMBAR SPINE - COMPLETE 4+ VIEW  COMPARISON:  None.  FINDINGS: There is no evidence of lumbar spine fracture. Alignment is normal. Degenerative disc disease at L5-S1. Remainder the disc heights are maintained. There is generalized osteopenia.  IMPRESSION: No acute osseous injury of the lumbar spine.   Electronically Signed   By: Kathreen Devoid   On: 08/17/2014 17:05   Dg Tibia/fibula Right  08/17/2014   CLINICAL DATA:  Patient tripped and fell at home with right leg pain today  EXAM: RIGHT TIBIA AND FIBULA - 2 VIEW  COMPARISON:  None.  FINDINGS: There is no evidence of fracture or other focal bone lesions. Soft tissues are unremarkable.  IMPRESSION: Negative.   Electronically Signed   By: Skipper Cliche M.D.   On: 08/17/2014 17:01   Dg Foot Complete Right  08/17/2014   CLINICAL DATA:  Per EMS- pt was pushing around a filing cabinet in her house. At some point tripped and fell on her right hip. C/o right hip. Difficulty straightening right leg to full extension.low back pain pain. Right hip down to foot pain since fall today. Best obtainable images due to pt condition and pt in severe pain.  EXAM: RIGHT FOOT COMPLETE - 3+ VIEW  COMPARISON:  None.  FINDINGS: No fracture. Joints are normally spaced and aligned. Soft tissues are unremarkable.  IMPRESSION: Negative.   Electronically Signed   By: Lajean Manes M.D.   On:  08/17/2014 17:01   Dg Hip Unilat With Pelvis 2-3 Views Right  08/17/2014   CLINICAL DATA:  Status post fall today striking the right hip, persistent hip pain and difficulty straightening the leg to full extension  EXAM: DG HIP (WITH OR WITHOUT PELVIS) 2-3V RIGHT  COMPARISON:  A abdominal pelvic radiograph of December 02, 2013  FINDINGS: The patient has sustained an impacted subcapital fracture of the right femur. The intertrochanteric and subtrochanteric regions are normal. There is mild  angulation at the fracture site. The observed portions of the right hemipelvis are normal.  IMPRESSION: The patient has sustained an acute impacted angulated subcapital fracture of the right femur.   Electronically Signed   By: David  Martinique M.D.   On: 08/17/2014 16:59   Dg Femur, Min 2 Views Right  08/17/2014   CLINICAL DATA:  Per EMS- pt was pushing around a filing cabinet in her house. At some point tripped and fell on her right hip. C/o right hip. Difficulty straightening right leg to full extension.low back pain pain. Right hip down to foot pain since fall today. Best obtainable images due to pt condition and pt in severe pain.  EXAM: RIGHT FEMUR 2 VIEWS  COMPARISON:  None.  FINDINGS: There is a right femoral neck fracture, which is displaced, with the shaft fracture component displacing superiorly by approximately 17 mm. There is no significant comminution. Varus angulation is noted.  No additional fractures. Hip and knee joints are normally aligned. Bones are diffusely demineralized.  IMPRESSION: Fracture right femoral neck, displaced and with varus angulation.   Electronically Signed   By: Lajean Manes M.D.   On: 08/17/2014 17:00     Medical Consultants:   Orthopedic Surgery, Netta Cedars, MD  Anti-Infectives:    None.  Subjective:   [name] reports some nausea and pain in her right leg.  Pain medication is effective in relieving. Last BM was 2 days ago.  Objective:    Filed Vitals:   08/17/14 2002 08/17/14 2040 08/18/14 0225 08/18/14 0642  BP: 163/61 155/72 134/60 135/64  Pulse: 82 83 79 75  Temp: 98.7 F (37.1 C) 99.1 F (37.3 C) 98.7 F (37.1 C) 99.3 F (37.4 C)  TempSrc:  Oral Oral Oral  Resp: 19 20 12 16   SpO2: 98% 99% 98% 100%    Intake/Output Summary (Last 24 hours) at 08/18/14 0758 Last data filed at 08/18/14 4315  Gross per 24 hour  Intake    990 ml  Output      0 ml  Net    990 ml    Exam: Gen:  NAD, thin Cardiovascular:  RRR, No  M/R/G Respiratory:  Lungs CTAB Gastrointestinal:  Abdomen soft, NT/ND, + BS Extremities:  RLE shortened and externally rotated   Data Reviewed:    Labs: Basic Metabolic Panel:  Recent Labs Lab 08/17/14 1735 08/18/14 0520  NA 139 140  K 3.8 4.1  CL 106 107  CO2 25 28  GLUCOSE 103* 114*  BUN 13 13  CREATININE 1.10* 0.94  CALCIUM 9.0 8.8*   GFR CrCl cannot be calculated (Unknown ideal weight.). Liver Function Tests:  Recent Labs Lab 08/17/14 1735 08/17/14 2215  AST 30  --   ALT 20  --   ALKPHOS 107  --   BILITOT 0.9  --   PROT 7.2  --   ALBUMIN 3.7 3.5   Coagulation profile  Recent Labs Lab 08/17/14 2215  INR 1.14    CBC:  Recent  Labs Lab 08/17/14 1735 08/18/14 0520  WBC 8.0 8.9  NEUTROABS 6.6  --   HGB 11.7* 10.4*  HCT 36.4 32.8*  MCV 89.9 89.1  PLT 139* 125*    Microbiology Recent Results (from the past 240 hour(s))  Surgical pcr screen     Status: None   Collection Time: 08/17/14 11:38 PM  Result Value Ref Range Status   MRSA, PCR NEGATIVE NEGATIVE Final   Staphylococcus aureus NEGATIVE NEGATIVE Final    Comment:        The Xpert SA Assay (FDA approved for NASAL specimens in patients over 23 years of age), is one component of a comprehensive surveillance program.  Test performance has been validated by [facility] for patients greater than or equal to 38 year old. It is not intended to diagnose infection nor to guide or monitor treatment.      Medications:   . chlorhexidine   Topical Once  . pantoprazole  40 mg Oral Daily  . pregabalin  50 mg Oral TID   Continuous Infusions: . sodium chloride 75 mL/hr at 08/17/14 2152    Time spent: 25 minutes.   LOS: 1 day   Jailin Manocchio  [facility] Pager [phone]. If unable to reach me by pager, please call my cell phone at [phone].  *Please refer to [email or web address], password [email or web address] to get updated schedule on who will round on this patient, as hospitalists switch teams  weekly. If 7PM-7AM, please contact night-coverage at [email or web address], password [email or web address] for any overnight needs.  08/18/2014, 7:58 AM

## 2014-08-18 NOTE — Anesthesia Preprocedure Evaluation (Addendum)
Anesthesia Evaluation  Patient identified by MRN, date of birth, ID band Patient awake    Reviewed: Allergy & Precautions, H&P , NPO status , Patient's Chart, lab work & pertinent test results  Airway Mallampati: II  TM Distance: >3 FB Neck ROM: full    Dental  (+) Dental Advisory Given, Caps, Missing Caps front upper teeth.  Missing left upper side teeth:   Pulmonary asthma ,  breath sounds clear to auscultation  Pulmonary exam normal       Cardiovascular Exercise Tolerance: Good negative cardio ROS Normal cardiovascular examRhythm:regular Rate:Normal     Neuro/Psych MS. Neurogenic bladder negative psych ROS   GI/Hepatic negative GI ROS, Neg liver ROS, GERD-  Medicated and Controlled,  Endo/Other  negative endocrine ROSHypothyroidism   Renal/GU negative Renal ROS  negative genitourinary   Musculoskeletal   Abdominal   Peds  Hematology negative hematology ROS (+) anemia , hgb 10.4   Anesthesia Other Findings   Reproductive/Obstetrics negative OB ROS                           Anesthesia Physical Anesthesia Plan  ASA: III  Anesthesia Plan: General   Post-op Pain Management:    Induction: Intravenous  Airway Management Planned: Oral ETT  Additional Equipment:   Intra-op Plan:   Post-operative Plan: Extubation in OR  Informed Consent: I have reviewed the patients History and Physical, chart, labs and discussed the procedure including the risks, benefits and alternatives for the proposed anesthesia with the patient or authorized representative who has indicated his/her understanding and acceptance.   Dental Advisory Given  Plan Discussed with: CRNA and Surgeon  Anesthesia Plan Comments:         Anesthesia Quick Evaluation

## 2014-08-18 NOTE — Progress Notes (Signed)
Asked to assume care by Dr. Veverly Fells. Patient seen and examined. Community ambulater with a cane. Has MS.  RLE shortened and externally rotated. Pain with logroll. + TA/GS/EHL, although weak due to hip pain. 2+ DP.   Discussed diagnosis and treatment options. Patient desires THA. The risks, benefits, and alternatives were discussed with the patient. There are risks associated with the surgery including, but not limited to, problems with anesthesia (death), infection, instability (giving out of the joint), dislocation, differences in leg length/angulation/rotation, fracture of bones, loosening or failure of implants, hematoma (blood accumulation) which may require surgical drainage, blood clots, pulmonary embolism, nerve injury (foot drop and lateral thigh numbness), and blood vessel injury. The patient understands these risks and elects to proceed.  Cont NPO Surgery this afternoon Hopefully can d/c home with [facility] PT

## 2014-08-18 NOTE — Op Note (Signed)
OPERATIVE REPORT  SURGEON: Rod Can, MD   ASSISTANT: Staff.  PREOPERATIVE DIAGNOSIS: Displaced Right femoral neck fracture.   POSTOPERATIVE DIAGNOSIS: Displaced Right femoral neck fracture.   PROCEDURE: Right total hip arthroplasty, anterior approach.   IMPLANTS: DePuy Tri Lock stem, size 2, std offset. DePuy Pinnacle Cup, size 48 mm. DePuy Altrx liner, size 48 by 32 mm, +4 neutral. DePuy Biolox ceramic head ball, size 32 + 1 mm.  ANESTHESIA:  General  ESTIMATED BLOOD LOSS: 350 mL.  ANTIBIOTICS: 2g ancef.  DRAINS: None.  COMPLICATIONS: None.   CONDITION: PACU - hemodynamically stable.Marland Kitchen   BRIEF CLINICAL NOTE: Erika Murphy is a 64 y.o. female with a displaced Right femoral neck fracture. Due to her pre-injury level of activity, the patient was indicated for total hip arthroplasty. The risks, benefits, and alternatives to the procedure were explained, and the patient elected to proceed.  PROCEDURE IN DETAIL: Surgical site was marked by myself. Once inside the operative room, general anesthesia was obtained. The patient was then positioned on the Hana table. All bony prominences were well padded. The hip was prepped and draped in the normal sterile surgical fashion. A time-out was called verifying side and site of surgery. The patient received IV antibiotics within 60 minutes of beginning the procedure.  The direct anterior approach to the hip was performed through the Hueter interval. Lateral femoral circumflex vessels were treated with the Auqumantys. The anterior capsule was exposed and an inverted T capsulotomy was made.Fracture hematoma was evacuated. Displaced femoral neck fracture was identified. The femoral neck cut was made to the level of the templated cut. A corkscrew was placed into the head and the head was removed. The head was passed to the back table and was measured.   Acetabular exposure was achieved, and she was found to have significant  cartilage wear. the pulvinar and labrum were excised. Sequential reaming of the acetabulum was then performed up to a size 47 mm reamer. A 48 mm cup was then opened and impacted into place at approximately 40 degrees of abduction and 20 degrees of anteversion. The final polyethylene liner was impacted into place and acetabular osteophytes were removed.   I then gained femoral exposure taking care to protect the abductors and greater trochanter. This was performed using standard external rotation, extension, and adduction. The capsule was peeled off the inner aspect of the greater trochanter, taking care to preserve the short external rotators. A cookie cutter was used to enter the femoral canal, and then the femoral canal finder was placed. Sequential broaching was performed up to a size 2. Calcar planer was used on the femoral neck remnant. I paced a std offset neck and a trial head ball. The hip was reduced. Leg lengths and offset were checked fluoroscopically. The hip was dislocated and trial components were removed. The final implants were placed, and the hip was reduced.  Fluoroscopy was used to confirm component position and leg lengths. At 90 degrees of external rotation and full extension, the hip was stable to an anterior directed force.  The wound was copiously irrigated with a dilute betadine solution followed by normal saline. Marcaine solution was injected into the periarticular soft tissue. The wound was closed in layers using #1 Vicryl and V-Loc for the fascia, 2-0 Vicryl for the subcutaneous fat, 2-0 Monocryl for the deep dermal layer, 3-0 running Monocryl subcuticular stitch, and Dermabond for the skin. Once the glue was fully dried, an Aquacell Ag dressing was applied. The patient was  transported to the recovery room in stable condition. Sponge, needle, and instrument counts were correct at the end of the case x2. The patient tolerated the procedure well and there were no  known complications.

## 2014-08-18 NOTE — Discharge Instructions (Signed)
°Dr. Birch Farino °Joint Replacement Specialist °[facility] °[address] °[place], [place] [address] °[phone] ° ° °TOTAL HIP REPLACEMENT POSTOPERATIVE DIRECTIONS ° ° ° °Hip Rehabilitation, Guidelines Following Surgery  ° °WEIGHT BEARING °Weight bearing as tolerated with assist device (walker, cane, etc) as directed, use it as long as suggested by your surgeon or therapist, typically at least 4-6 weeks. ° °The results of a hip operation are greatly improved after range of motion and muscle strengthening exercises. Follow all safety measures which are given to protect your hip. If any of these exercises cause increased pain or swelling in your joint, decrease the amount until you are comfortable again. Then slowly increase the exercises. Call your caregiver if you have problems or questions.  ° °HOME CARE INSTRUCTIONS  °Most of the following instructions are designed to prevent the dislocation of your new hip.  °Remove items at home which could result in a fall. This includes throw rugs or furniture in walking pathways.  °Continue medications as instructed at time of discharge. °· You may have some home medications which will be placed on hold until you complete the course of blood thinner medication. °· You may start showering once you are discharged home. Do not remove your dressing. °Do not put on socks or shoes without following the instructions of your caregivers.   °Sit on chairs with arms. Use the chair arms to help push yourself up when arising.  °Arrange for the use of a toilet seat elevator so you are not sitting low.  °· Walk with walker as instructed.  °You may resume a sexual relationship in one month or when given the OK by your caregiver.  °Use walker as long as suggested by your caregivers.  °You may put full weight on your legs and walk as much as is comfortable. °Avoid periods of inactivity such as sitting longer than an hour when not asleep. This helps prevent  blood clots.  °You may return to work once you are cleared by your surgeon.  °Do not drive a car for 6 weeks or until released by your surgeon.  °Do not drive while taking narcotics.  °Wear elastic stockings for two weeks following surgery during the day but you may remove then at night.  °Make sure you keep all of your appointments after your operation with all of your doctors and caregivers. You should call the office at the above phone number and make an appointment for approximately two weeks after the date of your surgery. °Please pick up a stool softener and laxative for home use as long as you are requiring pain medications. °· ICE to the affected hip every three hours for 30 minutes at a time and then as needed for pain and swelling. Continue to use ice on the hip for pain and swelling from surgery. You may notice swelling that will progress down to the foot and ankle.  This is normal after surgery.  Elevate the leg when you are not up walking on it.   °It is important for you to complete the blood thinner medication as prescribed by your doctor. °· Continue to use the breathing machine which will help keep your temperature down.  It is common for your temperature to cycle up and down following surgery, especially at night when you are not up moving around and exerting yourself.  The breathing machine keeps your lungs expanded and your temperature down. ° °RANGE OF MOTION AND STRENGTHENING EXERCISES  °These exercises are   designed to help you keep full movement of your hip joint. Follow your caregiver's or physical therapist's instructions. Perform all exercises about fifteen times, three times per day or as directed. Exercise both hips, even if you have had only one joint replacement. These exercises can be done on a training (exercise) mat, on the floor, on a table or on a bed. Use whatever works the best and is most comfortable for you. Use music or television while you are exercising so that the exercises  are a pleasant break in your day. This will make your life better with the exercises acting as a break in routine you can look forward to.  °Lying on your back, slowly slide your foot toward your buttocks, raising your knee up off the floor. Then slowly slide your foot back down until your leg is straight again.  °Lying on your back spread your legs as far apart as you can without causing discomfort.  °Lying on your side, raise your upper leg and foot straight up from the floor as far as is comfortable. Slowly lower the leg and repeat.  °Lying on your back, tighten up the muscle in the front of your thigh (quadriceps muscles). You can do this by keeping your leg straight and trying to raise your heel off the floor. This helps strengthen the largest muscle supporting your knee.  °Lying on your back, tighten up the muscles of your buttocks both with the legs straight and with the knee bent at a comfortable angle while keeping your heel on the floor.  ° °SKILLED REHAB INSTRUCTIONS: °If the patient is transferred to a skilled rehab facility following release from the hospital, a list of the current medications will be sent to the facility for the patient to continue.  When discharged from the skilled rehab facility, please have the facility set up the patient's Home Health Physical Therapy prior to being released. Also, the skilled facility will be responsible for providing the patient with their medications at time of release from the facility to include their pain medication and their blood thinner medication. If the patient is still at the rehab facility at time of the two week follow up appointment, the skilled rehab facility will also need to assist the patient in arranging follow up appointment in our office and any transportation needs. ° °MAKE SURE YOU:  °Understand these instructions.  °Will watch your condition.  °Will get help right away if you are not doing well or get worse. ° °Pick up stool softner and  laxative for home use following surgery while on pain medications. °Do not remove your dressing. °The dressing is waterproof--it is OK to take showers. °Continue to use ice for pain and swelling after surgery. °Do not use any lotions or creams on the incision until instructed by your surgeon. °Total Hip Protocol. ° ° °

## 2014-08-18 NOTE — Transfer of Care (Signed)
Immediate Anesthesia Transfer of Care Note  Patient: Erika Murphy  Procedure(s) Performed: Procedure(s): TOTAL HIP ARTHROPLASTY ANTERIOR APPROACH (Right)  Patient Location: PACU  Anesthesia Type:General  Level of Consciousness: awake, alert , sedated, patient cooperative and responds to stimulation  Airway & Oxygen Therapy: Patient connected to face mask oxygen  Post-op Assessment: Report given to RN, Post -op Vital signs reviewed and stable and Patient moving all extremities X 4  Post vital signs: stable  Last Vitals:  Filed Vitals:   08/18/14 1318  BP: 157/66  Pulse: 82  Temp: 37.7 C  Resp: 14    Complications: No apparent anesthesia complications

## 2014-08-19 ENCOUNTER — Encounter (HOSPITAL_COMMUNITY): Payer: Self-pay | Admitting: Orthopedic Surgery

## 2014-08-19 DIAGNOSIS — N319 Neuromuscular dysfunction of bladder, unspecified: Secondary | ICD-10-CM

## 2014-08-19 DIAGNOSIS — G35 Multiple sclerosis: Secondary | ICD-10-CM

## 2014-08-19 DIAGNOSIS — S72001A Fracture of unspecified part of neck of right femur, initial encounter for closed fracture: Secondary | ICD-10-CM

## 2014-08-19 LAB — VITAMIN D 25 HYDROXY (VIT D DEFICIENCY, FRACTURES): Vit D, 25-Hydroxy: 22 ng/mL — ABNORMAL LOW (ref 30.0–100.0)

## 2014-08-19 LAB — BASIC METABOLIC PANEL
ANION GAP: 6 (ref 5–15)
BUN: 14 mg/dL (ref 6–20)
CALCIUM: 8.5 mg/dL — AB (ref 8.9–10.3)
CO2: 26 mmol/L (ref 22–32)
Chloride: 105 mmol/L (ref 101–111)
Creatinine, Ser: 1.04 mg/dL — ABNORMAL HIGH (ref 0.44–1.00)
GFR calc Af Amer: >60 mL/min (ref >60)
GFR, EST NON AFRICAN AMERICAN: 55 mL/min — AB (ref >60)
Glucose, Bld: 201 mg/dL — ABNORMAL HIGH (ref 65–99)
Potassium: 4.5 mmol/L (ref 3.5–5.1)
SODIUM: 137 mmol/L (ref 135–145)

## 2014-08-19 LAB — CBC
HEMATOCRIT: 29.9 % — AB (ref 36.0–46.0)
Hemoglobin: 9.2 g/dL — ABNORMAL LOW (ref 12.0–15.0)
MCH: 27.1 pg (ref 26.0–34.0)
MCHC: 30.8 g/dL (ref 30.0–36.0)
MCV: 88.2 fL (ref 78.0–100.0)
PLATELETS: 105 10*3/uL — AB (ref 150–400)
RBC: 3.39 MIL/uL — ABNORMAL LOW (ref 3.87–5.11)
RDW: 12.5 % (ref 11.5–15.5)
WBC: 6 10*3/uL (ref 4.0–10.5)

## 2014-08-19 MED ORDER — POLYETHYLENE GLYCOL 3350 17 G PO PACK
17.0000 g | PACK | Freq: Every day | ORAL | Status: DC
  Administered 2014-08-19 – 2014-08-20 (×2): 17 g via ORAL

## 2014-08-19 MED ORDER — HYDROCODONE-ACETAMINOPHEN 5-325 MG PO TABS: 1.0000 | ORAL_TABLET | Freq: Four times a day (QID) | ORAL | Status: DC | PRN

## 2014-08-19 MED ORDER — HYDROCODONE-ACETAMINOPHEN 5-325 MG PO TABS
1.0000 | ORAL_TABLET | ORAL | Status: DC | PRN
  Administered 2014-08-19 – 2014-08-20 (×4): 1 via ORAL
  Filled 2014-08-19 (×4): qty 1

## 2014-08-19 MED ORDER — ASPIRIN 325 MG PO TBEC: 325.0000 mg | DELAYED_RELEASE_TABLET | Freq: Two times a day (BID) | ORAL | Status: DC

## 2014-08-19 NOTE — Progress Notes (Signed)
   Subjective:  Patient reports pain as mild.  Denies N/V/CP/SOB.  Objective:   VITALS:   Filed Vitals:   08/18/14 2144 08/18/14 2309 08/18/14 2350 08/19/14 0519  BP: 162/61 167/67 159/62 144/59  Pulse: 69 64 61 64  Temp: 98.4 F (36.9 C) 98.6 F (37 C) 97.8 F (36.6 C) 97.7 F (36.5 C)  TempSrc: Oral Oral Oral Oral  Resp: 20 20 20 20   Height:      Weight:      SpO2: 100% 100% 100% 100%    ABD soft Sensation intact distally Intact pulses distally Dorsiflexion/Plantar flexion intact Incision: dressing C/D/I Compartment soft   Lab Results  Component Value Date   WBC 6.0 08/19/2014   HGB 9.2* 08/19/2014   HCT 29.9* 08/19/2014   MCV 88.2 08/19/2014   PLT 105* 08/19/2014   BMET    Component Value Date/Time   NA 137 08/19/2014 0438   K 4.5 08/19/2014 0438   CL 105 08/19/2014 0438   CO2 26 08/19/2014 0438   GLUCOSE 201* 08/19/2014 0438   BUN 14 08/19/2014 0438   CREATININE 1.04* 08/19/2014 0438   CREATININE 1.11* 04/10/2014 1027   CALCIUM 8.5* 08/19/2014 0438   GFRNONAA 55* 08/19/2014 0438   GFRNONAA 52* 04/10/2014 1027   GFRAA >60 08/19/2014 0438   GFRAA 60 04/10/2014 1027     Assessment/Plan: 1 Day Post-Op   Principal Problem:   Closed right hip fracture Active Problems:   Multiple sclerosis   Neurogenic bladder   Displaced fracture of right femoral neck   WBAT with walker PO pain control DVT ppx: ASA 325mg  PO BID x30 days, SCDs, TEDs PT/OT Dispo: d/c home with HHPT when clears therapy    Erika Murphy, Erika Murphy 08/19/2014, 7:13 AM   Rod Can, MD Cell [phone]

## 2014-08-19 NOTE — Progress Notes (Signed)
Physical Therapy Treatment Patient Details Name: Erika Murphy MRN: [id number] DOB: [birth year] Today's Date: 08/19/2014    History of Present Illness 64 yo female admitted with R femur neck fx. S/P R THA-direct anterior 08/18/14. Hx of MS, neurogenic bladder, osteoporosis, fibromyalgia, osteopenia.     PT Comments    Progressing with mobility.   Follow Up Recommendations  Home health PT;Supervision/Assistance - 24 hour     Equipment Recommendations  None recommended by PT    Recommendations for Other Services       Precautions / Restrictions Precautions Precautions: Fall Restrictions Weight Bearing Restrictions: No RLE Weight Bearing: Weight bearing as tolerated    Mobility  Bed Mobility Overal bed mobility: Needs Assistance Bed Mobility: Supine to Sit;Sit to Supine     Supine to sit: HOB elevated;Min guard Sit to supine: Min assist;HOB elevated   General bed mobility comments: small amount of assist for LEs onto bed. Increased time  Transfers Overall transfer level: Needs assistance Equipment used: Rolling walker (2 wheeled) Transfers: Sit to/from Stand Sit to Stand: Min guard         General transfer comment: close guard for safety. VCs safety, hand placement  Ambulation/Gait Ambulation/Gait assistance: Min guard Ambulation Distance (Feet): 65 Feet Assistive device: Rolling walker (2 wheeled) Gait Pattern/deviations: Step-through pattern;Decreased stride length     General Gait Details: close guard for safety.    Stairs            Wheelchair Mobility    Modified Rankin (Stroke Patients Only)       Balance                                    Cognition Arousal/Alertness: Awake/alert Behavior During Therapy: WFL for tasks assessed/performed Overall Cognitive Status: Within Functional Limits for tasks assessed                      Exercises      General Comments        Pertinent Vitals/Pain Pain Assessment:  0-10 Pain Score: 5  Pain Location: R hip/thigh Pain Descriptors / Indicators: Sore Pain Intervention(s): Monitored during session    Home Living Family/patient expects to be discharged to:: Private residence Living Arrangements: Alone Available Help at Discharge: Family Type of Home: House Home Access: Stairs to enter Entrance Stairs-Rails: Right Home Layout: One level Home Equipment: Environmental consultant - 2 wheels;Cane - single point;Shower seat      Prior Function Level of Independence: Independent with assistive device(s)      Comments: Uses cane for ambulation in community   PT Goals (current goals can now be found in the care plan section) Acute Rehab PT Goals Patient Stated Goal: home soon. regain independence Progress towards PT goals: Progressing toward goals    Frequency  7X/week    PT Plan Current plan remains appropriate    Co-evaluation             End of Session Equipment Utilized During Treatment: Gait belt Activity Tolerance: Patient tolerated treatment well Patient left: in bed;with call bell/phone within reach;with family/visitor present     Time: 7829-5621 PT Time Calculation (min) (ACUTE ONLY): 17 min  Charges:  $Gait Training: 8-22 mins                    G Codes:      Weston Anna, MPT Pager: [phone]

## 2014-08-19 NOTE — Progress Notes (Signed)
Received cancellation order, please reorder if desire. Luanna Salk, [facility] SLP [phone]

## 2014-08-19 NOTE — Progress Notes (Signed)
CSW met with pt / reviewed PT / OT recommendations. Pt plans to return home with Home Health services and family assistance. RNCM will assist with d/c planning.  Werner Lean LCSW [phone]

## 2014-08-19 NOTE — Evaluation (Addendum)
SLP Cancellation Note  Patient Details Name: Erika Murphy MRN: [id number] DOB: [birth year]   Cancelled treatment:       Reason Eval/Treat Not Completed: Other (comment) (RN reports pt has been swallowing  her pills and intake well without difficulties, she reports no need for swallow evaluation, advised her that unless MD cancels order this SLP has to eval pt, Sarah RN advised she would notify MD to determine plan)  SLP will await plan.   Luanna Salk, [facility] SLP [phone]

## 2014-08-19 NOTE — Care Management Note (Addendum)
Case Management Note  Patient Details  Name: Erika Murphy MRN: [id number] Date of Birth: [birth year]  Subjective/Objective:                   Closed right hip fracture Action/Plan: Cm met with pt and son in room to offer choice of home health agency.  Pt chooses [facility] to render HHPT.  Address and contact information verified by pt.  Referral given to [employer], Tim.  Pt has rolling walker at home but needs a 31n.  CM called [facility] DME rep, Lecretia to please deliver the 3n1 to room prior to discharge.  No other CM needs were communcated.  Expected Discharge Date:   [name])               Expected Discharge Plan:  [place]  In-House Referral:     Discharge planning Services  CM Consult  Post Acute Care Choice:  Home Health Choice offered to:  Patient  DME Arranged:  3-N-1 DME Agency:  [place]:  PT [place]:  [place]  Status of Service:  Completed, signed off  Medicare Important Message Given:    Date Medicare IM Given:    Medicare IM give by:    Date Additional Medicare IM Given:    Additional Medicare Important Message give by:     If discussed at [place] of Stay Meetings, dates discussed:    Additional Comments: CM met with pt in room to offer choice of home health agency.  Pt chooses [facility] to render HHPT.  Address and contact information verified by pt.  Referral given to [employer], Tim.  Cm called [facility] DME rep, Lecretia to please deliver the rolling walker and 3n1 to room so pt can discharge.  Have requested face to face and HHPT orders.  No other CM needs were communicated. [name], RN 08/19/2014, 10:31 AM

## 2014-08-19 NOTE — Progress Notes (Signed)
Progress Note   Erika Murphy TTS:[id number] DOB: 05/12/1950 DOA: 08/17/2014 PCP: Elby Showers, MD   Brief Narrative:   Erika Murphy is an 64 y.o. female with a PMH of multiple sclerosis, osteopenia, hypothyroidism and asthma who was admitted 08/17/14 after suffering from a mechanical fall resulting in a right subcapital fracture with acute impacted angulation. Orthopedic surgery was consulted and plans to operate.  Assessment/Plan:   Principal Problem:   Closed right hip fracture - For surgical repair 8/16 - Continue pain relief efforts. Ambulated in hallway on 8/17, anticipate home with [facility] 8/18  Active Problems:   Multiple sclerosis - Ambulates with a walker at baseline.     Neurogenic bladder - Foley placed.  -foley removed on 8/17    DVT Prophylaxis - Lovenox ordered.  Family Communication:  Son in room Disposition Plan: home with [facility] Code Status:     Code Status Orders        Start     Ordered   08/17/14 2040  Full code   Continuous     08/17/14 2040        IV Access:    Peripheral IV   Procedures and diagnostic studies:   Dg Chest 1 View  08/17/2014   CLINICAL DATA:  Fall today. History of stomach cancer and asthma. Initial encounter.  EXAM: CHEST  1 VIEW  COMPARISON:  Radiographs dating back to 04/07/2013. Chest CT 04/11/2013.  FINDINGS: Chronic RIGHT pleural apical scarring. Hyperinflation compatible with emphysema. Cardiopericardial silhouette appears within normal limits. Aortic arch atherosclerosis. LEFT shoulder arthroplasty. RIGHT shoulder glenohumeral osteoarthritis.  IMPRESSION: No acute cardiopulmonary disease. Chronic bilateral RIGHT-greater-than- LEFT pleural apical scarring.   Electronically Signed   By: Dereck Ligas M.D.   On: 08/17/2014 17:41   Pelvis Portable  08/18/2014   CLINICAL DATA:  Postop right total hip arthroplasty for a right femoral neck fracture.  EXAM: PORTABLE PELVIS 1-2 VIEWS 2001 hr:  COMPARISON:  Intraoperative  portable examination earlier same date 1751 hr and right hip with AP pelvis yesterday.  FINDINGS: Postop right total hip arthroplasty with anatomic alignment. No complicating features. Contralateral left hip joint intact.  IMPRESSION: Anatomic alignment post right total hip arthroplasty without acute complicating features.   Electronically Signed   By: Evangeline Dakin M.D.   On: 08/18/2014 22:14   Dg C-arm 61-120 Min-no Report  08/18/2014   CLINICAL DATA: hip   C-ARM 61-120 MINUTES  Fluoroscopy was utilized by the requesting physician.  No radiographic  interpretation.    Dg Hip Operative Unilat With Pelvis Right  08/18/2014   CLINICAL DATA:  Postop right total hip arthroplasty for a right femoral neck fracture.  EXAM: OPERATIVE RIGHT HIP (WITH PELVIS IF PERFORMED) 1 VIEW  TECHNIQUE: Fluoroscopic spot image(s) were submitted for interpretation post-operatively.  COMPARISON:  Preoperative right hip x-rays yesterday.  FINDINGS: Right total hip arthroplasty with anatomic alignment in the AP projection. No complicating features.  The radiologic technologist documented 24 sec of fluoroscopy time.  IMPRESSION: Anatomic alignment post right total hip arthroplasty without acute complicating features.   Electronically Signed   By: Evangeline Dakin M.D.   On: 08/18/2014 19:35     Medical Consultants:   Orthopedic Surgery, Netta Cedars, MD  Anti-Infectives:    None.  Subjective:   Erika Murphy reports feeling well, still constipated, agree with miralax, reported h/o gastrectomy due to GI cancer, in remission currently  Objective:    Filed Vitals:   08/18/14 2309  08/18/14 2350 08/19/14 0519 08/19/14 1500  BP: 167/67 159/62 144/59 128/53  Pulse: 64 61 64 77  Temp: 98.6 F (37 C) 97.8 F (36.6 C) 97.7 F (36.5 C) 98.7 F (37.1 C)  TempSrc: Oral Oral Oral Oral  Resp: 20 20 20 20   Height:      Weight:      SpO2: 100% 100% 100% 100%    Intake/Output Summary (Last 24 hours) at 08/19/14  1657 Last data filed at 08/19/14 1602  Gross per 24 hour  Intake   4210 ml  Output   2300 ml  Net   1910 ml    Exam: Gen:  NAD, thin Cardiovascular:  RRR, No M/R/G Respiratory:  Lungs CTAB Gastrointestinal:  Abdomen soft, NT/ND, + BS Extremities:  RLE post op changes   Data Reviewed:    Labs: Basic Metabolic Panel:  Recent Labs Lab 08/17/14 1735 08/18/14 0520 08/19/14 0438  NA 139 140 137  K 3.8 4.1 4.5  CL 106 107 105  CO2 25 28 26   GLUCOSE 103* 114* 201*  BUN 13 13 14   CREATININE 1.10* 0.94 1.04*  CALCIUM 9.0 8.8* 8.5*   GFR Estimated Creatinine Clearance: 38.1 mL/min (by C-G formula based on Cr of 1.04). Liver Function Tests:  Recent Labs Lab 08/17/14 1735 08/17/14 2215  AST 30  --   ALT 20  --   ALKPHOS 107  --   BILITOT 0.9  --   PROT 7.2  --   ALBUMIN 3.7 3.5   Coagulation profile  Recent Labs Lab 08/17/14 2215  INR 1.14    CBC:  Recent Labs Lab 08/17/14 1735 08/18/14 0520 08/19/14 0438  WBC 8.0 8.9 6.0  NEUTROABS 6.6  --   --   HGB 11.7* 10.4* 9.2*  HCT 36.4 32.8* 29.9*  MCV 89.9 89.1 88.2  PLT 139* 125* 105*    Microbiology Recent Results (from the past 240 hour(s))  Urine culture     Status: None (Preliminary result)   Collection Time: 08/17/14 11:38 PM  Result Value Ref Range Status   Specimen Description URINE, CATHETERIZED  Final   Special Requests NONE  Final   Culture   Final    10,000 COLONIES/mL ENTEROCOCCUS SPECIES Performed at [facility]    Report Status PENDING  Incomplete  Surgical pcr screen     Status: None   Collection Time: 08/17/14 11:38 PM  Result Value Ref Range Status   MRSA, PCR NEGATIVE NEGATIVE Final   Staphylococcus aureus NEGATIVE NEGATIVE Final    Comment:        The Xpert SA Assay (FDA approved for NASAL specimens in patients over 88 years of age), is one component of a comprehensive surveillance program.  Test performance has been validated by [facility] for patients  greater than or equal to 57 year old. It is not intended to diagnose infection nor to guide or monitor treatment.      Medications:   . aspirin EC  325 mg Oral BID PC  . pantoprazole  40 mg Oral Daily  . polyethylene glycol  17 g Oral Daily  . pregabalin  50 mg Oral TID   Continuous Infusions: . sodium chloride      Time spent: 25 minutes.   LOS: 2 days   Tinnie Kunin  [facility] Pager [phone]  *Please refer to [email or web address], password [email or web address] to get updated schedule on who will round on this patient, as hospitalists switch teams weekly. If 7PM-7AM, please contact night-coverage  at [email or web address], password [email or web address] for any overnight needs.  08/19/2014, 4:57 PM

## 2014-08-19 NOTE — Evaluation (Signed)
Occupational Therapy Evaluation Patient Details Name: Erika Murphy MRN: [id number] DOB: 10/08/1950 Today's Date: 08/19/2014    History of Present Illness 64 yo female admitted with R femur neck fx. S/P R THA-direct anterior 08/18/14. Hx of MS, neurogenic bladder, osteoporosis, fibromyalgia, osteopenia.    Clinical Impression   Pt is s/p THA resulting in the deficits listed below (see OT Problem List).  Pt will benefit from skilled OT to increase their safety and independence with ADL and functional mobility for ADL to facilitate discharge to venue listed below.        Follow Up Recommendations  Home health OT    Equipment Recommendations  3 in 1 bedside comode    Recommendations for Other Services       Precautions / Restrictions Precautions Precautions: Fall Restrictions Weight Bearing Restrictions: No RLE Weight Bearing: Weight bearing as tolerated      Mobility Bed Mobility Overal bed mobility: Needs Assistance Bed Mobility: Supine to Sit     Supine to sit: Min assist;HOB elevated     General bed mobility comments: pt inchair  Transfers Overall transfer level: Needs assistance Equipment used: 1 person hand held assist Transfers: Sit to/from Stand Sit to Stand: Min assist         General transfer comment: VC for safety and hand placement    Balance Overall balance assessment: Needs assistance;History of Falls         Standing balance support: Bilateral upper extremity supported;During functional activity Standing balance-Leahy Scale: Poor                              ADL Overall ADL's : Needs assistance/impaired                     Lower Body Dressing: Moderate assistance;Sit to/from stand;Cueing for safety;With adaptive equipment   Toilet Transfer: Minimal assistance;Comfort height toilet   Toileting- Clothing Manipulation and Hygiene: Minimal assistance;Sit to/from stand   Tub/ Shower Transfer: Minimal assistance;Cueing  for safety;Cueing for sequencing   Functional mobility during ADLs: Cueing for sequencing;Cueing for safety;Minimal assistance                 Pertinent Vitals/Pain Pain Assessment: 0-10 Pain Score: 4  Pain Location: r hip Pain Descriptors / Indicators: Sore Pain Intervention(s): Monitored during session;Ice applied     Hand Dominance     Extremity/Trunk Assessment Upper Extremity Assessment Upper Extremity Assessment: Generalized weakness   Lower Extremity Assessment Lower Extremity Assessment: Generalized weakness;RLE deficits/detail RLE Deficits / Details: DF/PF 2/5, hip abd/add 2/5, hip flex 2/5-limited range RLE: Unable to fully assess due to pain   Cervical / Trunk Assessment Cervical / Trunk Assessment: Kyphotic   Communication Communication Communication: No difficulties   Cognition Arousal/Alertness: Awake/alert Behavior During Therapy: WFL for tasks assessed/performed Overall Cognitive Status: Within Functional Limits for tasks assessed                     General Comments       Exercises       Shoulder Instructions      Home Living Family/patient expects to be discharged to:: Private residence Living Arrangements: Alone Available Help at Discharge: Family Type of Home: House Home Access: Stairs to enter Technical brewer of Steps: 2 Entrance Stairs-Rails: Right Home Layout: One level     Bathroom Shower/Tub: Tub/shower unit Shower/tub characteristics: Door       Home Equipment: Environmental consultant -  2 wheels;Cane - single point;Shower seat          Prior Functioning/Environment Level of Independence: Independent with assistive device(s)        Comments: Uses cane for ambulation in community    OT Diagnosis: Generalized weakness   OT Problem List: Decreased strength;Decreased activity tolerance   OT Treatment/Interventions: Self-care/ADL training;DME and/or AE instruction;Patient/family education    OT Goals(Current goals  can be found in the care plan section) Acute Rehab OT Goals Patient Stated Goal: home soon. regain independence OT Goal Formulation: With patient Time For Goal Achievement: 09/02/14 Potential to Achieve Goals: Good  OT Frequency: Min 2X/week   Barriers to D/C:            Co-evaluation              End of Session Nurse Communication: Mobility status  Activity Tolerance: Patient tolerated treatment well Patient left:     Time: 7867-5449 OT Time Calculation (min): 21 min Charges:  OT General Charges $OT Visit: 1 Procedure G-Codes:    Payton Mccallum D 09/08/2014, 1:05 PM

## 2014-08-19 NOTE — Care Management Important Message (Signed)
Important Message  Patient Details  Name: Erika Murphy MRN: [id number] Date of Birth: [birth year]   Medicare Important Message Given:  Yes-second notification given    Camillo Flaming 08/19/2014, 1:33 [place] Message  Patient Details  Name: Erika Murphy MRN: [id number] Date of Birth: 1950-10-27   Medicare Important Message Given:  Yes-second notification given    Camillo Flaming 08/19/2014, 1:33 PM

## 2014-08-19 NOTE — Evaluation (Signed)
Physical Therapy Evaluation Patient Details Name: Erika Murphy MRN: [id number] DOB: April 21, 1950 Today's Date: 08/19/2014   History of Present Illness  64 yo female admitted with R femur neck fx. S/P R THA-direct anterior 08/18/14. Hx of MS, neurogenic bladder, osteoporosis, fibromyalgia, osteopenia.   Clinical Impression  On eval, pt required Min assist for mobility-walked ~50 feet with RW. Pain rated 5-6/10 with activity.     Follow Up Recommendations Home health PT;Supervision/Assistance - 24 hour    Equipment Recommendations  None recommended by PT    Recommendations for Other Services       Precautions / Restrictions Precautions Precautions: Fall Restrictions Weight Bearing Restrictions: No RLE Weight Bearing: Weight bearing as tolerated      Mobility  Bed Mobility Overal bed mobility: Needs Assistance Bed Mobility: Supine to Sit     Supine to sit: Min assist;HOB elevated     General bed mobility comments: Assist for trunk and R LE. Increased time.VCs safety, technique.   Transfers Overall transfer level: Needs assistance Equipment used: Rolling walker (2 wheeled) Transfers: Sit to/from Stand Sit to Stand: Min assist;From elevated surface         General transfer comment: Assist to rise, stabilize, control descent. VCs safety, technique, hand placement  Ambulation/Gait Ambulation/Gait assistance: Min assist Ambulation Distance (Feet): 50 Feet Assistive device: Rolling walker (2 wheeled) Gait Pattern/deviations: Step-to pattern;Step-through pattern;Decreased stride length;Trunk flexed;Decreased dorsiflexion - right     General Gait Details: Assist to stabilize. Fatigues with ambulation. Pain increased as distance progressed.   Stairs            Wheelchair Mobility    Modified Rankin (Stroke Patients Only)       Balance Overall balance assessment: Needs assistance;History of Falls         Standing balance support: Bilateral upper extremity  supported;During functional activity Standing balance-Leahy Scale: Poor                               Pertinent Vitals/Pain Pain Assessment: 0-10 Pain Score: 5  Pain Location: r hip/thigh Pain Descriptors / Indicators: Sore Pain Intervention(s): Monitored during session;Ice applied;Repositioned    Home Living Family/patient expects to be discharged to:: Private residence Living Arrangements: Alone Available Help at Discharge: Family Type of Home: House Home Access: Stairs to enter Entrance Stairs-Rails: Right Entrance Stairs-Number of Steps: 2 Home Layout: One level Home Equipment: [place] - 2 wheels;Cane - single point;Shower seat      Prior Function Level of Independence: Independent with assistive device(s)         Comments: Uses cane for ambulation in community     Hand Dominance        Extremity/Trunk Assessment   Upper Extremity Assessment: Defer to OT evaluation           Lower Extremity Assessment: Generalized weakness;RLE deficits/detail RLE Deficits / Details: DF/PF 2/5, hip abd/add 2/5, hip flex 2/5-limited range    Cervical / Trunk Assessment: Kyphotic  Communication   Communication: No difficulties  Cognition Arousal/Alertness: Awake/alert Behavior During Therapy: WFL for tasks assessed/performed Overall Cognitive Status: Within Functional Limits for tasks assessed                      General Comments      Exercises Total Joint Exercises Ankle Circles/Pumps: AROM;Both;10 reps;Supine Quad Sets: AROM;Both;10 reps;Supine Heel Slides: AAROM;Right;10 reps;Supine Hip ABduction/ADduction: AAROM;Right;10 reps;Supine      Assessment/Plan  PT Assessment Patient needs continued PT services  PT Diagnosis Difficulty walking;Generalized weakness;Acute pain   PT Problem List Decreased strength;Decreased range of motion;Decreased activity tolerance;Decreased balance;Decreased mobility;Decreased knowledge of use of DME;Pain   PT Treatment Interventions DME instruction;Gait training;Stair training;Functional mobility training;Therapeutic activities;Patient/family education;Balance training;Therapeutic exercise   PT Goals (Current goals can be found in the Care Plan section) Acute Rehab PT Goals Patient Stated Goal: home soon. regain independence PT Goal Formulation: With patient Time For Goal Achievement: 08/26/14 Potential to Achieve Goals: Good    Frequency 7X/week   Barriers to discharge        Co-evaluation               End of Session Equipment Utilized During Treatment: Gait belt Activity Tolerance: Patient limited by fatigue;Patient limited by pain Patient left: in chair;with call bell/phone within reach           Time: 1029-1047 PT Time Calculation (min) (ACUTE ONLY): 18 min   Charges:   PT Evaluation $Initial PT Evaluation Tier I: 1 Procedure     PT G Codes:        Weston Anna, MPT Pager: [phone]

## 2014-08-20 LAB — URINE CULTURE: Culture: 10000

## 2014-08-20 LAB — BASIC METABOLIC PANEL
Anion gap: 3 — ABNORMAL LOW (ref 5–15)
BUN: 14 mg/dL (ref 6–20)
CHLORIDE: 110 mmol/L (ref 101–111)
CO2: 29 mmol/L (ref 22–32)
Calcium: 8.1 mg/dL — ABNORMAL LOW (ref 8.9–10.3)
Creatinine, Ser: 1 mg/dL (ref 0.44–1.00)
GFR calc Af Amer: >60 mL/min (ref >60)
GFR calc non Af Amer: 57 mL/min — ABNORMAL LOW (ref >60)
GLUCOSE: 95 mg/dL (ref 65–99)
POTASSIUM: 3.8 mmol/L (ref 3.5–5.1)
Sodium: 142 mmol/L (ref 135–145)

## 2014-08-20 LAB — CBC
HCT: 22.9 % — ABNORMAL LOW (ref 36.0–46.0)
HEMOGLOBIN: 7.5 g/dL — AB (ref 12.0–15.0)
MCH: 28.7 pg (ref 26.0–34.0)
MCHC: 32.8 g/dL (ref 30.0–36.0)
MCV: 87.7 fL (ref 78.0–100.0)
PLATELETS: 87 10*3/uL — AB (ref 150–400)
RBC: 2.61 MIL/uL — AB (ref 3.87–5.11)
RDW: 12.9 % (ref 11.5–15.5)
WBC: 6.7 10*3/uL (ref 4.0–10.5)

## 2014-08-20 LAB — GLUCOSE, CAPILLARY: Glucose-Capillary: 99 mg/dL (ref 65–99)

## 2014-08-20 LAB — PREPARE RBC (CROSSMATCH)

## 2014-08-20 MED ORDER — SODIUM CHLORIDE 0.9 % IV SOLN
Freq: Once | INTRAVENOUS | Status: AC
  Administered 2014-08-20: 10:00:00 via INTRAVENOUS

## 2014-08-20 NOTE — Progress Notes (Signed)
Physical Therapy Treatment Patient Details Name: CLYDELL ALBERTS MRN: [id number] DOB: September 07, 1950 Today's Date: 08/20/2014    History of Present Illness 64 yo female admitted with R femur neck fx. S/P R THA-direct anterior 08/18/14. Hx of MS, neurogenic bladder, osteoporosis, fibromyalgia, osteopenia.     PT Comments    Progressing with  Mobility. Pt currently receiving blood. Practiced ambulation and exercises. Will return to practice steps once blood is finished.   Follow Up Recommendations  Home health PT;Supervision/Assistance - 24 hour     Equipment Recommendations  None recommended by PT    Recommendations for Other Services       Precautions / Restrictions Precautions Precautions: Fall Restrictions Weight Bearing Restrictions: No RLE Weight Bearing: Weight bearing as tolerated    Mobility  Bed Mobility               General bed mobility comments: pt oob in recliner  Transfers Overall transfer level: Needs assistance Equipment used: Rolling walker (2 wheeled) Transfers: Sit to/from Stand Sit to Stand: Min guard         General transfer comment: close guard for safety. VCs safety, hand placement. Unsteady with initial standing.  Ambulation/Gait Ambulation/Gait assistance: Min guard Ambulation Distance (Feet): 90 Feet Assistive device: Rolling walker (2 wheeled) Gait Pattern/deviations: Step-to pattern;Step-through pattern;Decreased stride length;Decreased step length - right;Decreased dorsiflexion - right     General Gait Details: close guard for safety. slow gait speed.    Stairs            Wheelchair Mobility    Modified Rankin (Stroke Patients Only)       Balance                                    Cognition Arousal/Alertness: Awake/alert Behavior During Therapy: WFL for tasks assessed/performed Overall Cognitive Status: Within Functional Limits for tasks assessed                      Exercises Total Joint  Exercises Ankle Circles/Pumps: AROM;Both;10 reps;Supine Quad Sets: AROM;Both;10 reps;Supine Heel Slides: AAROM;Right;10 reps;Supine Hip ABduction/ADduction: AAROM;Right;10 reps;Supine    General Comments        Pertinent Vitals/Pain Pain Assessment: 0-10 Pain Score: 5  Pain Location: R LE Pain Descriptors / Indicators: Sore Pain Intervention(s): Monitored during session;Ice applied;Repositioned    Home Living                      Prior Function            PT Goals (current goals can now be found in the care plan section) Progress towards PT goals: Progressing toward goals    Frequency  7X/week    PT Plan Current plan remains appropriate    Co-evaluation             End of Session   Activity Tolerance: Patient tolerated treatment well Patient left: in chair;with call bell/phone within reach;with family/visitor present     Time: 3009-2330 PT Time Calculation (min) (ACUTE ONLY): 15 min  Charges:  $Gait Training: 8-22 mins                    G Codes:      Weston Anna, MPT Pager: [phone]

## 2014-08-20 NOTE — Progress Notes (Signed)
Occupational Therapy Treatment Patient Details Name: AMELIAROSE SHARK MRN: [id number] DOB: December 03, 1950 Today's Date: 08/20/2014    History of present illness 64 yo female admitted with R femur neck fx. S/P R THA-direct anterior 08/18/14. Hx of MS, neurogenic bladder, osteoporosis, fibromyalgia, osteopenia.    OT comments  Pt has a GREAT family that will A at home  Follow Up Recommendations  Home health OT    Equipment Recommendations  3 in 1 bedside comode       Precautions / Restrictions Precautions Precautions: Fall Restrictions RLE Weight Bearing: Weight bearing as tolerated       Mobility Bed Mobility Overal bed mobility: Needs Assistance Bed Mobility: Supine to Sit     Supine to sit: Min guard Sit to supine: Min guard      Transfers Overall transfer level: Needs assistance Equipment used: Rolling walker (2 wheeled) Transfers: Sit to/from Stand Sit to Stand: Supervision         General transfer comment: close guard for safety. VCs safety, hand placement        ADL Overall ADL's : Needs assistance/impaired                     Lower Body Dressing: Minimal assistance;Sit to/from stand;Cueing for safety   Toilet Transfer: Min guard;RW   Toileting- Clothing Manipulation and Hygiene: Supervision/safety;Sit to/from stand         General ADL Comments: VC for safety with ADL activity . Famiily provide A as needed. Pt has a GREAT family!                Cognition   Behavior During Therapy: WFL for tasks assessed/performed Overall Cognitive Status: Within Functional Limits for tasks assessed                       Extremity/Trunk Assessment                          Pertinent Vitals/ Pain       Pain Score: 6  Pain Location: r hip area Pain Descriptors / Indicators: Sore;Aching         Frequency Min 2X/week     Progress Toward Goals  OT Goals(current goals can now be found in the care plan section)  Progress towards  OT goals: Progressing toward goals  Acute Rehab OT Goals Patient Stated Goal: home soon. regain independence  Plan Discharge plan remains appropriate    Co-evaluation                 End of Session     Activity Tolerance Patient tolerated treatment well   Patient Left in chair   Nurse Communication Mobility status        Time: 8099-8338 OT Time Calculation (min): 19 min  Charges: OT General Charges $OT Visit: 1 Procedure OT Treatments $Self Care/Home Management : 8-22 mins  Kamla Skilton, Thereasa Parkin 08/20/2014, 9:17 AM

## 2014-08-20 NOTE — Progress Notes (Signed)
   Subjective:  Patient reports pain as mild.  Denies N/V/CP/SOB. Denies dizzyness.  Objective:   VITALS:   Filed Vitals:   08/19/14 0519 08/19/14 1500 08/19/14 2103 08/20/14 0521  BP: 144/59 128/53 152/54 123/60  Pulse: 64 77 72 71  Temp: 97.7 F (36.5 C) 98.7 F (37.1 C) 98.6 F (37 C) 98 F (36.7 C)  TempSrc: Oral Oral Oral Oral  Resp: 20 20 20 18   Height:      Weight:      SpO2: 100% 100% 100% 100%    ABD soft Sensation intact distally Intact pulses distally Dorsiflexion/Plantar flexion intact Incision: dressing C/D/I Compartment soft   Lab Results  Component Value Date   WBC 6.7 08/20/2014   HGB 7.5* 08/20/2014   HCT 22.9* 08/20/2014   MCV 87.7 08/20/2014   PLT 87* 08/20/2014   BMET    Component Value Date/Time   NA 142 08/20/2014 0506   K 3.8 08/20/2014 0506   CL 110 08/20/2014 0506   CO2 29 08/20/2014 0506   GLUCOSE 95 08/20/2014 0506   BUN 14 08/20/2014 0506   CREATININE 1.00 08/20/2014 0506   CREATININE 1.11* 04/10/2014 1027   CALCIUM 8.1* 08/20/2014 0506   GFRNONAA 57* 08/20/2014 0506   GFRNONAA 52* 04/10/2014 1027   GFRAA >60 08/20/2014 0506   GFRAA 60 04/10/2014 1027     Assessment/Plan: 2 Days Post-Op   Principal Problem:   Closed right hip fracture Active Problems:   Multiple sclerosis   Neurogenic bladder   Displaced fracture of right femoral neck   WBAT with walker PO pain control DVT ppx: ASA 325mg  PO BID x30 days, SCDs, TEDs PT/OT ABLA on chronic anemia: asymptomatic, monitor Dispo: d/c home with HHPT when clears therapy    Danisha Brassfield, Horald Pollen 08/20/2014, 7:59 AM   Rod Can, MD Cell [phone]

## 2014-08-20 NOTE — Progress Notes (Signed)
Physical Therapy Treatment Patient Details Name: TALIA HOHEISEL MRN: [id number] DOB: [birth year] Today's Date: 08/20/2014    History of Present Illness 64 yo female admitted with R femur neck fx. S/P R THA-direct anterior 08/18/14. Hx of MS, neurogenic bladder, osteoporosis, fibromyalgia, osteopenia.     PT Comments    2nd session to practice steps. All education completed. Pt with no further questions/concerns regarding mobility. Ready to d/c from PT standpoint.   Follow Up Recommendations  Home health PT;Supervision/Assistance - 24 hour     Equipment Recommendations  None recommended by PT    Recommendations for Other Services       Precautions / Restrictions Precautions Precautions: Fall Restrictions Weight Bearing Restrictions: No RLE Weight Bearing: Weight bearing as tolerated    Mobility  Bed Mobility               General bed mobility comments: pt oob in recliner  Transfers Overall transfer level: Needs assistance Equipment used: Rolling walker (2 wheeled) Transfers: Sit to/from Stand Sit to Stand: Min guard         General transfer comment: close guard for safety. VCs safety, hand placement. Unsteady with initial standing.  Ambulation/Gait Ambulation/Gait assistance: Min guard Ambulation Distance (Feet): 25 Feet Assistive device: Rolling walker (2 wheeled) Gait Pattern/deviations: Step-to pattern;Step-through pattern;Decreased stride length;Decreased dorsiflexion - right;Decreased step length - right     General Gait Details: close guard for safety. slow gait speed.    Stairs Stairs: Yes Stairs assistance: Min assist Stair Management: One rail Left;Forwards;Step to pattern Number of Stairs: 2 General stair comments: 1 rail L, 1 HHA R. VCs safety, technique, sequence. Min assist to stabilize. Up and over portable steps (up 2, down 3)  Wheelchair Mobility    Modified Rankin (Stroke Patients Only)       Balance                                    Cognition Arousal/Alertness: Awake/alert Behavior During Therapy: WFL for tasks assessed/performed Overall Cognitive Status: Within Functional Limits for tasks assessed                      Exercises     General Comments        Pertinent Vitals/Pain Pain Assessment: 0-10 Pain Score: 5  Pain Location: R LE Pain Descriptors / Indicators: Sore Pain Intervention(s): Monitored during session;Repositioned    Home Living                      Prior Function            PT Goals (current goals can now be found in the care plan section) Progress towards PT goals: Progressing toward goals    Frequency  7X/week    PT Plan Current plan remains appropriate    Co-evaluation             End of Session Equipment Utilized During Treatment: Gait belt Activity Tolerance: Patient tolerated treatment well Patient left: in chair;with call bell/phone within reach     Time: 1501-1509 PT Time Calculation (min) (ACUTE ONLY): 8 min  Charges:  $Gait Training: 8-22 mins                    G Codes:      Weston Anna, MPT Pager: [phone]

## 2014-08-20 NOTE — Discharge Summary (Signed)
Discharge Summary  Erika Murphy:[id number] DOB: 10-06-1950  PCP: Elby Showers, MD  Admit date: 08/17/2014 Discharge date: 08/20/2014  Time spent: >13mins  Recommendations for Outpatient Follow-up:  1. F/u with PMD within a week, pmd to repeat cbc at follow up to monitor anemia 2. F/u with orthopedic surgery  3. Home health  Discharge Diagnoses:  Active Hospital Problems   Diagnosis Date Noted  . Closed right hip fracture 08/17/2014  . Displaced fracture of right femoral neck 08/18/2014  . Multiple sclerosis 06/02/2010  . Neurogenic bladder 06/02/2010    Resolved Hospital Problems   Diagnosis Date Noted Date Resolved  . Hip fracture 08/17/2014 08/18/2014    Discharge Condition: stable  Diet recommendation: heart healthy  Filed Weights   08/18/14 0811  Weight: 100 lb (45.36 kg)    History of present illness:  Erika Murphy is a 64 y.o. female   has a past medical history of MS (multiple sclerosis) (dx'd ~ 1998); Osteopenia; IBS (irritable bowel syndrome); Neurogenic bladder; Stomach cancer (2001); Asthma; History of blood transfusion (2001); GERD (gastroesophageal reflux disease); Thyroid nodule; Hypothyroidism; Arthritis; Osteoporosis; Fibromyalgia; and B12 deficiency anemia.   Presented with  At 1:30 PM patient was pushing a dressed when her foot slipped and she fell And was unable to stand up. No head injury no LOC.  She called for help and family called EMS. On presentaion to ER she was found to have Right subcapital fracture with acute impacted angulation. Orthopedics consulted. ER provider spoke to Dr. Isidore Moos who was planning to operate in the near future made patient nothing by mouth She denies any cardiac issues. No chest pain or shortness of breath. No Fever. Patient has had trouble with neurogenic bladder states she has trouble feeling when she is needs to urinate. Patient has hx of MS and fibromyalgia and walks with a cane.   Hospitalist was called for  admission for Right hip fracture  Hospital Course:  Principal Problem:   Closed right hip fracture Active Problems:   Multiple sclerosis   Neurogenic bladder   Displaced fracture of right femoral neck  Principal Problem:  Closed right hip fracture - For surgical repair 8/16 - Continue pain relief efforts. Ambulated in hallway on 8/17, home with [facility] 8/18  Post op blood loss anemia: s/p prbc 1units. pmd to repeat cbc in one week.  Active Problems:  Multiple sclerosis - Ambulates with a walker at baseline.  -continue outpatient neurology follow up   Neurogenic bladder - Foley placed.  -foley removed on 8/17 -no problem voiding  Enterococcus species in urine, sensitive to ampicillin, She received perioperative abx  Ancef x3doses. patient denies symptom, no leukocytosis, no fever, no confusion.  no abx at discharge, follow up with pmd   DVT Prophylaxis - Lovenox in the hospital, discharged with asa 325mg  as ordered by ortho  Family Communication: patient and son Disposition Plan: home with [facility]  Procedures: Right total hip arthroplasty, anterior approach 8/16 prbc transfusion 1uints on 8/18  Consultations: Orthopedic Surgery  Discharge Exam: BP 129/62 mmHg  Pulse 72  Temp(Src) 98.9 F (37.2 C) (Oral)  Resp 18  Ht 5\' 4"  (1.626 m)  Wt 100 lb (45.36 kg)  BMI 17.16 kg/m2  SpO2 100%  Gen: NAD, thin Cardiovascular: RRR, No M/R/G Respiratory: Lungs CTAB Gastrointestinal: Abdomen soft, NT/ND, + BS Extremities: RLE post op changes   Discharge Instructions You were cared for by a hospitalist during your hospital stay. If you have any questions about your  discharge medications or the care you received while you were in the hospital after you are discharged, you can call the unit and asked to speak with the hospitalist on call if the hospitalist that took care of you is not available. Once you are discharged, your primary care physician will handle any further  medical issues. Please note that NO REFILLS for any discharge medications will be authorized once you are discharged, as it is imperative that you return to your primary care physician (or establish a relationship with a primary care physician if you do not have one) for your aftercare needs so that they can reassess your need for medications and monitor your lab values.  Discharge Instructions    Diet - low sodium heart healthy    Complete by:  As directed      Diet - low sodium heart healthy    Complete by:  As directed      Face-to-face encounter (required for Medicare/Medicaid patients)    Complete by:  As directed   I Perry Molla certify that this patient is under my care and that I, or a nurse practitioner or physician's assistant working with me, had a face-to-face encounter that meets the physician face-to-face encounter requirements with this patient on 08/19/2014. The encounter with the patient was in whole, or in part for the following medical condition(s) which is the primary reason for home health care (List medical condition): FTT/hip fracture  The encounter with the patient was in whole, or in part, for the following medical condition, which is the primary reason for home health care:  FTT/ hip fracture  I certify that, based on my findings, the following services are medically necessary home health services:  Physical therapy  Reason for Medically Necessary Home Health Services:  Skilled Nursing- Change/Decline in Patient Status  My clinical findings support the need for the above services:  Unsafe ambulation due to balance issues  Further, I certify that my clinical findings support that this patient is homebound due to:  Unsafe ambulation due to balance issues     Home Health    Complete by:  As directed   To provide the following care/treatments:  PT     Increase activity slowly    Complete by:  As directed      Increase activity slowly    Complete by:  As directed               Medication List    TAKE these medications        albuterol 108 (90 BASE) MCG/ACT inhaler  Commonly known as:  PROVENTIL HFA;VENTOLIN HFA  Inhale 2 puffs into the lungs every 6 (six) hours as needed for wheezing or shortness of breath.     aspirin 325 MG EC tablet  Take 1 tablet (325 mg total) by mouth 2 (two) times daily after a meal.     cyanocobalamin 1000 MCG/ML injection  Commonly known as:  (VITAMIN B-12)  Inject 1,000 mcg into the muscle every 30 (thirty) days.     ergocalciferol 50000 UNITS capsule  Commonly known as:  VITAMIN D2  Take weekly x 12 weeks then take 2000 units Vitamin D3 daily after completing 12 weeks course.     HYDROcodone-acetaminophen 5-325 MG per tablet  Commonly known as:  NORCO/VICODIN  Take 1-2 tablets by mouth every 6 (six) hours as needed for moderate pain.     LYRICA 50 MG capsule  Generic drug:  pregabalin  TAKE ONE CAPSULE  BY MOUTH 3 TIMES A DAY     pantoprazole 40 MG tablet  Commonly known as:  PROTONIX  TAKE 1 TABLET (40 MG TOTAL) BY MOUTH DAILY.       No Known Allergies     Follow-up Information    Follow up with Swinteck, Horald Pollen, MD. Schedule an appointment as soon as possible for a visit in 2 weeks.   Specialty:  Orthopedic Surgery   Why:  For wound re-check   Contact information:   [address] [place] [address] [phone]       Follow up with [facility].   Why:  home health physical therapy   Contact information:   [address] [place] [address] [phone]       Follow up with [facility].   Why:  3n1   Contact information:   [address] [place] [address] [phone]       Follow up with Elby Showers, MD In 1 week.   Specialty:  Internal Medicine   Why:  hospital discharge follow up   Contact information:   [address] [place] [address] [phone]        The results of significant diagnostics from  this hospitalization (including imaging, microbiology, ancillary and laboratory) are listed below for reference.    Significant Diagnostic Studies: Dg Chest 1 View  08/17/2014   CLINICAL DATA:  Fall today. History of stomach cancer and asthma. Initial encounter.  EXAM: CHEST  1 VIEW  COMPARISON:  Radiographs dating back to 04/07/2013. Chest CT 04/11/2013.  FINDINGS: Chronic RIGHT pleural apical scarring. Hyperinflation compatible with emphysema. Cardiopericardial silhouette appears within normal limits. Aortic arch atherosclerosis. LEFT shoulder arthroplasty. RIGHT shoulder glenohumeral osteoarthritis.  IMPRESSION: No acute cardiopulmonary disease. Chronic bilateral RIGHT-greater-than- LEFT pleural apical scarring.   Electronically Signed   By: Dereck Ligas M.D.   On: 08/17/2014 17:41   Dg Lumbar Spine Complete  08/17/2014   CLINICAL DATA:  Status post fall. Difficulty straightening the a right leg.  EXAM: LUMBAR SPINE - COMPLETE 4+ VIEW  COMPARISON:  None.  FINDINGS: There is no evidence of lumbar spine fracture. Alignment is normal. Degenerative disc disease at L5-S1. Remainder the disc heights are maintained. There is generalized osteopenia.  IMPRESSION: No acute osseous injury of the lumbar spine.   Electronically Signed   By: Kathreen Devoid   On: 08/17/2014 17:05   Dg Tibia/fibula Right  08/17/2014   CLINICAL DATA:  Patient tripped and fell at home with right leg pain today  EXAM: RIGHT TIBIA AND FIBULA - 2 VIEW  COMPARISON:  None.  FINDINGS: There is no evidence of fracture or other focal bone lesions. Soft tissues are unremarkable.  IMPRESSION: Negative.   Electronically Signed   By: Skipper Cliche M.D.   On: 08/17/2014 17:01   Pelvis Portable  08/18/2014   CLINICAL DATA:  Postop right total hip arthroplasty for a right femoral neck fracture.  EXAM: PORTABLE PELVIS 1-2 VIEWS 2001 hr:  COMPARISON:  Intraoperative portable examination earlier same date 1751 hr and right hip with AP pelvis  yesterday.  FINDINGS: Postop right total hip arthroplasty with anatomic alignment. No complicating features. Contralateral left hip joint intact.  IMPRESSION: Anatomic alignment post right total hip arthroplasty without acute complicating features.   Electronically Signed   By: Evangeline Dakin M.D.   On: 08/18/2014 22:14   Dg Foot Complete Right  08/17/2014   CLINICAL  DATA:  Per EMS- pt was pushing around a filing cabinet in her house. At some point tripped and fell on her right hip. C/o right hip. Difficulty straightening right leg to full extension.low back pain pain. Right hip down to foot pain since fall today. Best obtainable images due to pt condition and pt in severe pain.  EXAM: RIGHT FOOT COMPLETE - 3+ VIEW  COMPARISON:  None.  FINDINGS: No fracture. Joints are normally spaced and aligned. Soft tissues are unremarkable.  IMPRESSION: Negative.   Electronically Signed   By: Lajean Manes M.D.   On: 08/17/2014 17:01   Dg C-arm 61-120 Min-no Report  08/18/2014   CLINICAL DATA: hip   C-ARM 61-120 MINUTES  Fluoroscopy was utilized by the requesting physician.  No radiographic  interpretation.    Dg Hip Operative Unilat With Pelvis Right  08/18/2014   CLINICAL DATA:  Postop right total hip arthroplasty for a right femoral neck fracture.  EXAM: OPERATIVE RIGHT HIP (WITH PELVIS IF PERFORMED) 1 VIEW  TECHNIQUE: Fluoroscopic spot image(s) were submitted for interpretation post-operatively.  COMPARISON:  Preoperative right hip x-rays yesterday.  FINDINGS: Right total hip arthroplasty with anatomic alignment in the AP projection. No complicating features.  The radiologic technologist documented 24 sec of fluoroscopy time.  IMPRESSION: Anatomic alignment post right total hip arthroplasty without acute complicating features.   Electronically Signed   By: Evangeline Dakin M.D.   On: 08/18/2014 19:35   Dg Hip Unilat With Pelvis 2-3 Views Right  08/17/2014   CLINICAL DATA:  Status post fall today striking the  right hip, persistent hip pain and difficulty straightening the leg to full extension  EXAM: DG HIP (WITH OR WITHOUT PELVIS) 2-3V RIGHT  COMPARISON:  A abdominal pelvic radiograph of December 02, 2013  FINDINGS: The patient has sustained an impacted subcapital fracture of the right femur. The intertrochanteric and subtrochanteric regions are normal. There is mild angulation at the fracture site. The observed portions of the right hemipelvis are normal.  IMPRESSION: The patient has sustained an acute impacted angulated subcapital fracture of the right femur.   Electronically Signed   By: David  Martinique M.D.   On: 08/17/2014 16:59   Dg Femur, Min 2 Views Right  08/17/2014   CLINICAL DATA:  Per EMS- pt was pushing around a filing cabinet in her house. At some point tripped and fell on her right hip. C/o right hip. Difficulty straightening right leg to full extension.low back pain pain. Right hip down to foot pain since fall today. Best obtainable images due to pt condition and pt in severe pain.  EXAM: RIGHT FEMUR 2 VIEWS  COMPARISON:  None.  FINDINGS: There is a right femoral neck fracture, which is displaced, with the shaft fracture component displacing superiorly by approximately 17 mm. There is no significant comminution. Varus angulation is noted.  No additional fractures. Hip and knee joints are normally aligned. Bones are diffusely demineralized.  IMPRESSION: Fracture right femoral neck, displaced and with varus angulation.   Electronically Signed   By: Lajean Manes M.D.   On: 08/17/2014 17:00    Microbiology: Recent Results (from the past 240 hour(s))  Urine culture     Status: None   Collection Time: 08/17/14 11:38 PM  Result Value Ref Range Status   Specimen Description URINE, CATHETERIZED  Final   Special Requests NONE  Final   Culture   Final    10,000 COLONIES/mL ENTEROCOCCUS SPECIES Performed at [facility]    Report  Status 08/20/2014 FINAL  Final   Organism ID, Bacteria  ENTEROCOCCUS SPECIES  Final      Susceptibility   Enterococcus species - MIC*    AMPICILLIN <=2 SENSITIVE Sensitive     LEVOFLOXACIN 2 SENSITIVE Sensitive     NITROFURANTOIN <=16 SENSITIVE Sensitive     VANCOMYCIN <=0.5 SENSITIVE Sensitive     * 10,000 COLONIES/mL ENTEROCOCCUS SPECIES  Surgical pcr screen     Status: None   Collection Time: 08/17/14 11:38 PM  Result Value Ref Range Status   MRSA, PCR NEGATIVE NEGATIVE Final   Staphylococcus aureus NEGATIVE NEGATIVE Final    Comment:        The Xpert SA Assay (FDA approved for NASAL specimens in patients over 66 years of age), is one component of a comprehensive surveillance program.  Test performance has been validated by [facility] for patients greater than or equal to 62 year old. It is not intended to diagnose infection nor to guide or monitor treatment.      Labs: Basic Metabolic Panel:  Recent Labs Lab 08/17/14 1735 08/18/14 0520 08/19/14 0438 08/20/14 0506  NA 139 140 137 142  K 3.8 4.1 4.5 3.8  CL 106 107 105 110  CO2 25 28 26 29   GLUCOSE 103* 114* 201* 95  BUN 13 13 14 14   CREATININE 1.10* 0.94 1.04* 1.00  CALCIUM 9.0 8.8* 8.5* 8.1*   Liver Function Tests:  Recent Labs Lab 08/17/14 1735 08/17/14 2215  AST 30  --   ALT 20  --   ALKPHOS 107  --   BILITOT 0.9  --   PROT 7.2  --   ALBUMIN 3.7 3.5   No results for input(s): LIPASE, AMYLASE in the last 168 hours. No results for input(s): AMMONIA in the last 168 hours. CBC:  Recent Labs Lab 08/17/14 1735 08/18/14 0520 08/19/14 0438 08/20/14 0506  WBC 8.0 8.9 6.0 6.7  NEUTROABS 6.6  --   --   --   HGB 11.7* 10.4* 9.2* 7.5*  HCT 36.4 32.8* 29.9* 22.9*  MCV 89.9 89.1 88.2 87.7  PLT 139* 125* 105* 87*   Cardiac Enzymes: No results for input(s): CKTOTAL, CKMB, CKMBINDEX, TROPONINI in the last 168 hours. BNP: BNP (last 3 results) No results for input(s): BNP in the last 8760 hours.  ProBNP (last 3 results) No results for input(s): PROBNP  in the last 8760 hours.  CBG:  Recent Labs Lab 08/20/14 0731  GLUCAP 99       Signed:  Tristan Bramble MD, PhD  [facility] 08/20/2014, 3:40 PM

## 2014-08-21 LAB — TYPE AND SCREEN
ABO/RH(D): B POS
ANTIBODY SCREEN: NEGATIVE
UNIT DIVISION: 0

## 2014-08-22 DIAGNOSIS — M8580 Other specified disorders of bone density and structure, unspecified site: Secondary | ICD-10-CM | POA: Diagnosis not present

## 2014-08-22 DIAGNOSIS — M797 Fibromyalgia: Secondary | ICD-10-CM | POA: Diagnosis not present

## 2014-08-22 DIAGNOSIS — M810 Age-related osteoporosis without current pathological fracture: Secondary | ICD-10-CM | POA: Diagnosis not present

## 2014-08-22 DIAGNOSIS — G35 Multiple sclerosis: Secondary | ICD-10-CM | POA: Diagnosis not present

## 2014-08-22 DIAGNOSIS — M1990 Unspecified osteoarthritis, unspecified site: Secondary | ICD-10-CM | POA: Diagnosis not present

## 2014-08-22 DIAGNOSIS — S72001D Fracture of unspecified part of neck of right femur, subsequent encounter for closed fracture with routine healing: Secondary | ICD-10-CM | POA: Diagnosis not present

## 2014-08-25 DIAGNOSIS — M8580 Other specified disorders of bone density and structure, unspecified site: Secondary | ICD-10-CM | POA: Diagnosis not present

## 2014-08-25 DIAGNOSIS — G35 Multiple sclerosis: Secondary | ICD-10-CM | POA: Diagnosis not present

## 2014-08-25 DIAGNOSIS — S72001D Fracture of unspecified part of neck of right femur, subsequent encounter for closed fracture with routine healing: Secondary | ICD-10-CM | POA: Diagnosis not present

## 2014-08-25 DIAGNOSIS — M797 Fibromyalgia: Secondary | ICD-10-CM | POA: Diagnosis not present

## 2014-08-25 DIAGNOSIS — M1990 Unspecified osteoarthritis, unspecified site: Secondary | ICD-10-CM | POA: Diagnosis not present

## 2014-08-25 DIAGNOSIS — M810 Age-related osteoporosis without current pathological fracture: Secondary | ICD-10-CM | POA: Diagnosis not present

## 2014-08-27 ENCOUNTER — Encounter: Payer: Self-pay | Admitting: Internal Medicine

## 2014-08-27 ENCOUNTER — Ambulatory Visit (INDEPENDENT_AMBULATORY_CARE_PROVIDER_SITE_OTHER): Payer: Medicare Other | Admitting: Internal Medicine

## 2014-08-27 VITALS — BP 128/68 | HR 76 | Temp 99.0°F

## 2014-08-27 DIAGNOSIS — T83511A Infection and inflammatory reaction due to indwelling urethral catheter, initial encounter: Principal | ICD-10-CM

## 2014-08-27 DIAGNOSIS — R8299 Other abnormal findings in urine: Secondary | ICD-10-CM | POA: Diagnosis not present

## 2014-08-27 DIAGNOSIS — R8290 Unspecified abnormal findings in urine: Secondary | ICD-10-CM

## 2014-08-27 DIAGNOSIS — M1990 Unspecified osteoarthritis, unspecified site: Secondary | ICD-10-CM | POA: Diagnosis not present

## 2014-08-27 DIAGNOSIS — N390 Urinary tract infection, site not specified: Secondary | ICD-10-CM

## 2014-08-27 DIAGNOSIS — S72001D Fracture of unspecified part of neck of right femur, subsequent encounter for closed fracture with routine healing: Secondary | ICD-10-CM | POA: Diagnosis not present

## 2014-08-27 DIAGNOSIS — M797 Fibromyalgia: Secondary | ICD-10-CM | POA: Diagnosis not present

## 2014-08-27 DIAGNOSIS — T8351XA Infection and inflammatory reaction due to indwelling urinary catheter, initial encounter: Secondary | ICD-10-CM | POA: Diagnosis not present

## 2014-08-27 DIAGNOSIS — M8580 Other specified disorders of bone density and structure, unspecified site: Secondary | ICD-10-CM | POA: Diagnosis not present

## 2014-08-27 DIAGNOSIS — G35 Multiple sclerosis: Secondary | ICD-10-CM | POA: Diagnosis not present

## 2014-08-27 DIAGNOSIS — M810 Age-related osteoporosis without current pathological fracture: Secondary | ICD-10-CM | POA: Diagnosis not present

## 2014-08-27 LAB — POCT URINALYSIS DIPSTICK
Bilirubin, UA: NEGATIVE
Blood, UA: NEGATIVE
GLUCOSE UA: NEGATIVE
Ketones, UA: NEGATIVE
NITRITE UA: NEGATIVE
PROTEIN UA: NEGATIVE
Spec Grav, UA: >=1.03
UROBILINOGEN UA: 4
pH, UA: 6

## 2014-08-27 MED ORDER — CIPROFLOXACIN HCL 250 MG PO TABS: 500.0000 mg | ORAL_TABLET | Freq: Two times a day (BID) | ORAL | Status: DC

## 2014-08-27 NOTE — Patient Instructions (Signed)
Take Cipro as directed for 7 days. Urine culture pending. Continue monthly B-12 injections.

## 2014-08-27 NOTE — Progress Notes (Signed)
   Subjective:    Patient ID: Erika Murphy, female    DOB: July 08, 1950, 64 y.o.   MRN: [id number]  HPI Patient has multiple sclerosis. She has a gait disturbance. She fell in her home trying to move a dresser on August 15. Subsequently had  total right hip arthroplasty for a right femoral neck fracture. She's doing well. She was told she had a urinary tract infection in the hospital but culture grew 10,000 colonies per milliliter Enterococcus species. This was not treated.  Review of Systems     Objective:   Physical Exam  She looks very well. She ambulates with a walker with a seat. No complaint of pain. Urine dipstick is abnormal. Culture sent. Chest clear. Cardiac exam regular rate and rhythm. Extremities without edema.      Assessment & Plan:  Multiple sclerosis-stable  Right femoral neck fracture status post total right hip arthroplasty  Possible UTI  Plan: Cover with Cipro 250 mg by mouth twice daily for 7 days.  12 5 minutes spent with patient

## 2014-08-28 LAB — URINE CULTURE
Colony Count: NO GROWTH
Organism ID, Bacteria: NO GROWTH

## 2014-08-31 DIAGNOSIS — M797 Fibromyalgia: Secondary | ICD-10-CM | POA: Diagnosis not present

## 2014-08-31 DIAGNOSIS — M8580 Other specified disorders of bone density and structure, unspecified site: Secondary | ICD-10-CM | POA: Diagnosis not present

## 2014-08-31 DIAGNOSIS — Z96641 Presence of right artificial hip joint: Secondary | ICD-10-CM | POA: Diagnosis not present

## 2014-08-31 DIAGNOSIS — S72001D Fracture of unspecified part of neck of right femur, subsequent encounter for closed fracture with routine healing: Secondary | ICD-10-CM | POA: Diagnosis not present

## 2014-08-31 DIAGNOSIS — Z471 Aftercare following joint replacement surgery: Secondary | ICD-10-CM | POA: Diagnosis not present

## 2014-08-31 DIAGNOSIS — M810 Age-related osteoporosis without current pathological fracture: Secondary | ICD-10-CM | POA: Diagnosis not present

## 2014-08-31 DIAGNOSIS — M1990 Unspecified osteoarthritis, unspecified site: Secondary | ICD-10-CM | POA: Diagnosis not present

## 2014-08-31 DIAGNOSIS — G35 Multiple sclerosis: Secondary | ICD-10-CM | POA: Diagnosis not present

## 2014-09-02 DIAGNOSIS — M810 Age-related osteoporosis without current pathological fracture: Secondary | ICD-10-CM | POA: Diagnosis not present

## 2014-09-02 DIAGNOSIS — S72001D Fracture of unspecified part of neck of right femur, subsequent encounter for closed fracture with routine healing: Secondary | ICD-10-CM | POA: Diagnosis not present

## 2014-09-02 DIAGNOSIS — G35 Multiple sclerosis: Secondary | ICD-10-CM | POA: Diagnosis not present

## 2014-09-02 DIAGNOSIS — M1990 Unspecified osteoarthritis, unspecified site: Secondary | ICD-10-CM | POA: Diagnosis not present

## 2014-09-02 DIAGNOSIS — M8580 Other specified disorders of bone density and structure, unspecified site: Secondary | ICD-10-CM | POA: Diagnosis not present

## 2014-09-02 DIAGNOSIS — M797 Fibromyalgia: Secondary | ICD-10-CM | POA: Diagnosis not present

## 2014-09-11 DIAGNOSIS — M25551 Pain in right hip: Secondary | ICD-10-CM | POA: Diagnosis not present

## 2014-09-14 ENCOUNTER — Other Ambulatory Visit: Payer: Self-pay | Admitting: *Deleted

## 2014-09-14 MED ORDER — PANTOPRAZOLE SODIUM 40 MG PO TBEC: DELAYED_RELEASE_TABLET | ORAL | Status: DC

## 2014-09-14 NOTE — Telephone Encounter (Signed)
protonix refilled

## 2014-09-15 ENCOUNTER — Encounter: Payer: Self-pay | Admitting: Internal Medicine

## 2014-09-15 ENCOUNTER — Ambulatory Visit (INDEPENDENT_AMBULATORY_CARE_PROVIDER_SITE_OTHER): Payer: Medicare Other | Admitting: Internal Medicine

## 2014-09-15 ENCOUNTER — Telehealth: Payer: Self-pay | Admitting: *Deleted

## 2014-09-15 ENCOUNTER — Ambulatory Visit
Admission: RE | Admit: 2014-09-15 | Discharge: 2014-09-15 | Disposition: A | Discharge status: Home / Self Care | Payer: Medicare Other | Source: Ambulatory Visit | Attending: Internal Medicine | Admitting: Internal Medicine

## 2014-09-15 VITALS — BP 110/72 | HR 73 | Temp 97.7°F | Ht 64.0 in | Wt 100.0 lb

## 2014-09-15 DIAGNOSIS — G35 Multiple sclerosis: Secondary | ICD-10-CM | POA: Diagnosis not present

## 2014-09-15 DIAGNOSIS — R05 Cough: Secondary | ICD-10-CM | POA: Diagnosis not present

## 2014-09-15 DIAGNOSIS — J209 Acute bronchitis, unspecified: Secondary | ICD-10-CM

## 2014-09-15 DIAGNOSIS — Z85028 Personal history of other malignant neoplasm of stomach: Secondary | ICD-10-CM | POA: Diagnosis not present

## 2014-09-15 DIAGNOSIS — R059 Cough, unspecified: Secondary | ICD-10-CM

## 2014-09-15 DIAGNOSIS — E538 Deficiency of other specified B group vitamins: Secondary | ICD-10-CM | POA: Diagnosis not present

## 2014-09-15 DIAGNOSIS — R0602 Shortness of breath: Secondary | ICD-10-CM | POA: Diagnosis not present

## 2014-09-15 MED ORDER — LEVOFLOXACIN 250 MG PO TABS: 250.0000 mg | ORAL_TABLET | Freq: Every day | ORAL | Status: DC

## 2014-09-15 MED ORDER — CYANOCOBALAMIN 1000 MCG/ML IJ SOLN
1000.0000 ug | Freq: Once | INTRAMUSCULAR | Status: AC
  Administered 2014-09-15: 1000 ug via INTRAMUSCULAR

## 2014-09-15 NOTE — Progress Notes (Signed)
   Subjective:    Patient ID: Erika Murphy, female    DOB: 30-Sep-1950, 64 y.o.   MRN: [id number]  HPI Patient was here August 25 following up on possible urinary tract infection from recent hospitalization where she had hip arthroscopy status post hip fracture. Was told she had a UTI at that time. We checked urine culture and it only grew 10,000 colonies per milliliter of Enterococcus species. We then decided to cover her with Cipro but somehow she lost the prescription and never got it filled. Apparently has had no significant UTI symptoms. Now has developed URI symptoms with cough and congestion. She does have multiple sclerosis. She's going to outpatient physical therapy. Has been released from home physical therapy.  No fever or shaking chills. Cough with slight sputum production.    Review of Systems     Objective:   Physical Exam  Pharynx is clear. TMs are clear. Neck is supple without adenopathy. Chest is clear to auscultation without rales or wheezing.      Assessment & Plan:  Acute bronchitis  Multiple sclerosis  Recent hip fracture. Now using walker. Getting outpatient physical therapy  B 12 deficiency-given 1 mL IM cyanocobalamin today. Return in one month.  Plan: Levaquin 250 mg daily for 7 days.

## 2014-09-15 NOTE — Telephone Encounter (Signed)
Left message for patient with xray results and instructions

## 2014-09-16 DIAGNOSIS — M25551 Pain in right hip: Secondary | ICD-10-CM | POA: Diagnosis not present

## 2014-09-16 NOTE — Patient Instructions (Signed)
Levaquin 250 mg daily for 7 days for acute bronchitis. B-12 IM given. Return in one month for B-12 injection.

## 2014-09-18 ENCOUNTER — Ambulatory Visit: Payer: Medicare Other | Admitting: Neurology

## 2014-09-18 DIAGNOSIS — M25551 Pain in right hip: Secondary | ICD-10-CM | POA: Diagnosis not present

## 2014-09-22 DIAGNOSIS — M25551 Pain in right hip: Secondary | ICD-10-CM | POA: Diagnosis not present

## 2014-09-24 ENCOUNTER — Ambulatory Visit: Payer: Self-pay | Admitting: Neurology

## 2014-09-24 ENCOUNTER — Telehealth: Payer: Self-pay | Admitting: *Deleted

## 2014-09-24 NOTE — Telephone Encounter (Signed)
No showed follow up appt. 

## 2014-09-25 DIAGNOSIS — M25551 Pain in right hip: Secondary | ICD-10-CM | POA: Diagnosis not present

## 2014-09-30 DIAGNOSIS — Z471 Aftercare following joint replacement surgery: Secondary | ICD-10-CM | POA: Diagnosis not present

## 2014-09-30 DIAGNOSIS — M25551 Pain in right hip: Secondary | ICD-10-CM | POA: Diagnosis not present

## 2014-09-30 DIAGNOSIS — Z96641 Presence of right artificial hip joint: Secondary | ICD-10-CM | POA: Diagnosis not present

## 2014-10-14 DIAGNOSIS — M19011 Primary osteoarthritis, right shoulder: Secondary | ICD-10-CM | POA: Diagnosis not present

## 2014-10-15 ENCOUNTER — Ambulatory Visit (INDEPENDENT_AMBULATORY_CARE_PROVIDER_SITE_OTHER): Payer: Medicare Other | Admitting: Internal Medicine

## 2014-10-15 ENCOUNTER — Other Ambulatory Visit: Payer: Self-pay | Admitting: Internal Medicine

## 2014-10-15 VITALS — Temp 98.0°F

## 2014-10-15 DIAGNOSIS — Z23 Encounter for immunization: Secondary | ICD-10-CM | POA: Diagnosis not present

## 2014-10-15 DIAGNOSIS — E538 Deficiency of other specified B group vitamins: Secondary | ICD-10-CM

## 2014-10-15 MED ORDER — CYANOCOBALAMIN 1000 MCG/ML IJ SOLN
1000.0000 ug | Freq: Once | INTRAMUSCULAR | Status: AC
  Administered 2014-10-15: 1000 ug via INTRAMUSCULAR

## 2014-10-15 NOTE — Telephone Encounter (Signed)
Refill for 90 days

## 2014-10-21 ENCOUNTER — Encounter: Payer: Self-pay | Admitting: Neurology

## 2014-10-21 ENCOUNTER — Ambulatory Visit (INDEPENDENT_AMBULATORY_CARE_PROVIDER_SITE_OTHER): Payer: Medicare Other | Admitting: Neurology

## 2014-10-21 VITALS — BP 149/76 | HR 63 | Ht 64.0 in | Wt 100.0 lb

## 2014-10-21 DIAGNOSIS — G35 Multiple sclerosis: Secondary | ICD-10-CM | POA: Diagnosis not present

## 2014-10-21 DIAGNOSIS — S72001G Fracture of unspecified part of neck of right femur, subsequent encounter for closed fracture with delayed healing: Secondary | ICD-10-CM

## 2014-10-21 NOTE — Progress Notes (Signed)
Chief Complaint  Patient presents with  . Multiple Sclerosis    Feels her symptoms are stable with no new concerns.  She did have a fall in August 2016 that required her to have a right hip replacement.  She completed physical therapy last week.  She uses a cane for ambulation.      [facility]  PATIENT: Erika Murphy DOB: Jul 22, 1950  HISTORICAL  Erika Murphy is a 64 years old right-handed African American female, patient of Dr. Erling Cruz, followed up for multiple sclerosis, last clinical visit was Sep 2014  Her primary care physician is Dr. Tommie Ard Baxley  She was diagnosed with multiple sclerosis in 1998, presented with spastic gait disorder, was treated with Betaseron since 1998, which was stopped in 2011 due to abnormal liver functional test, her abnormal liver function test did recover back to normal after stopping Betaseron.  Her multiple sclerosis symptoms have been  fairly stable over years, there was no worsening of her symptoms without taking Betaseron and baclofen, she is only taking Lyrica 50 mg 3 times a day for neuropathic pain involving bilateral upper and lower extremities.  Since July 2014, she also had intermittent left shoulder radiating pain to her left lateral arm, left hand, sometimes woke her up from sleep, intermittent, there was no significant worsening of weakness.  She also had a history of left shoulder replacement in 2011,  She has a history of gastric adenocarcinoma, status post subtotal gastrectomy, partial omentectomy in March 20001, status post chemotherapy and radiation therapy.  Most recent MRI of the brain was in June 2013, small white matter hyperintensities are unchanged from 2010. No enhancing or restricted diffusion lesions are seen. These lesions are nonspecific and could be seen with multiple sclerosis or chronic microvascular ischemia most abnormality is seen in MRI of cervical spine, most recently in 2011,  MRI scan of the cervical spine  in 2011 showed prominent spondylitic changes from C4-6 with mild left sided foramnial stenosis at C4-5, C5-6 and bilateral foraminal stenosis at C6-7. Illdefined spinal cord hyperintensities at C5-6 and T2-3 likely represent remote age demyelinating plaque  She lives alone, ambulate with a cane, no bowel bladder incontinence.  She is dong well, still lives alone in a house with couple steps, using cane to ambulate, she never drive. She is taking Lyrica 50 mg 3 times a day for her upper and lower extremity pain, which has been very helpful, taking Valium 5 mg as needed,  Over the years, her multiple sclerosis symptoms has been very stable, there is no worsening of her gait problem.  She was admitted to the hospital in October 8th 2015, complaining of worsening gait difficulty, worsening right arm, and right leg weakness, deep achy pain, this was preceded by vomitting, loose stool for few days, was treated with IV Solu-Medrol for 3 days, repeat MRI of the brain, and cervical spine Oct 2015, showed no acute lesions.  She continued to ambulate with a cane, dragging her right leg, reported 80% improvement, receiving home physical therapy, which has been helpful, Tylenol has been helpful too  UPDATE Oct 21 2014: She fell, and the broke her right hip require hip replacement August 2016, completed physical therapy.  She has increased leg stiffiness, pain. Aleve prn helps.  REVIEW OF SYSTEMS: Full 14 system review of systems performed and notable only for weakness, walking difficulties  ALLERGIES: No Known Allergies  HOME MEDICATIONS: Outpatient Prescriptions Prior to Visit  Medication Sig Dispense Refill  . albuterol (  PROVENTIL HFA;VENTOLIN HFA) 108 (90 BASE) MCG/ACT inhaler Inhale 2 puffs into the lungs every 6 (six) hours as needed for wheezing or shortness of breath.    Marland Kitchen aspirin EC 325 MG EC tablet Take 1 tablet (325 mg total) by mouth 2 (two) times daily after a meal. 60 tablet 0  .  ciprofloxacin (CIPRO) 250 MG tablet Take 2 tablets (500 mg total) by mouth 2 (two) times daily. 14 tablet 0  . cyanocobalamin (,VITAMIN B-12,) 1000 MCG/ML injection Inject 1,000 mcg into the muscle every 30 (thirty) days.    . diazepam (VALIUM) 5 MG tablet TAKE 1 TABLET BY MOUTH TWICE A DAY AS NEEDED 180 tablet 0  . ergocalciferol (VITAMIN D2) 50000 UNITS capsule Take weekly x 12 weeks then take 2000 units Vitamin D3 daily after completing 12 weeks course. 4 capsule 3  . HYDROcodone-acetaminophen (NORCO/VICODIN) 5-325 MG per tablet Take 1-2 tablets by mouth every 6 (six) hours as needed for moderate pain. 90 tablet 0  . levofloxacin (LEVAQUIN) 250 MG tablet Take 1 tablet (250 mg total) by mouth daily. 7 tablet 0  . LYRICA 50 MG capsule TAKE ONE CAPSULE BY MOUTH 3 TIMES A DAY 90 capsule 1  . pantoprazole (PROTONIX) 40 MG tablet TAKE 1 TABLET (40 MG TOTAL) BY MOUTH DAILY. 90 tablet 3   No facility-administered medications prior to visit.    PAST MEDICAL HISTORY: Past Medical History  Diagnosis Date  . MS (multiple sclerosis) ([place]) dx'd ~ 1998  . Osteopenia   . IBS (irritable bowel syndrome)   . Neurogenic bladder   . Stomach cancer ([place]) 2001  . Asthma   . History of blood transfusion 2001    "when I had my stomach cancer"  . GERD (gastroesophageal reflux disease)   . Thyroid nodule     right  . Hypothyroidism   . Arthritis     "right shoulder" (10/09/2013)  . Osteoporosis   . Fibromyalgia   . B12 deficiency anemia     PAST SURGICAL HISTORY: Past Surgical History  Procedure Laterality Date  . Partial gastrectomy  2001  . Cesarean section  1975; 1984  . Tonsillectomy  ~ 1959  . Joint replacement    . Omentectomy  2001    partial  . Total shoulder arthroplasty Left 2011  . Biopsy thyroid Right 1990's  . Total hip arthroplasty Right 08/18/2014    Procedure: TOTAL HIP ARTHROPLASTY ANTERIOR APPROACH;  Surgeon: Rod Can, MD;  Location: [facility];  Service: Orthopedics;   Laterality: Right;    FAMILY HISTORY: Family History  Problem Relation Age of Onset  . Heart disease Mother   . Diabetes Father   . Stomach cancer Brother   . Multiple sclerosis Other     SOCIAL HISTORY:  Social History   Social History  . Marital Status: Divorced    Spouse Name: N/A  . Number of Children: 3   . Years of Education: N/A   Occupational History  . DISABLED    Social History Main Topics  . Smoking status: Never Smoker   . Smokeless tobacco: Never Used     Comment: NEVER USED TOBACCO  . Alcohol Use: No  . Drug Use: No  . Sexual Activity: No   Other Topics Concern  . Not on file   Social History Narrative   Patient is retired.    Patient is married with 3 children.   Patient drinks caffeine daily.      PHYSICAL EXAM  Filed Vitals:   10/21/14 1217  BP: 149/76  Pulse: 63  Height: 5\' 4"  (1.626 m)  Weight: 100 lb (45.36 kg)     Body mass index is 17.16 kg/(m^2).  PHYSICAL EXAMNIATION:  Gen: NAD, conversant, well nourised, obese, well groomed                     Cardiovascular: Regular rate rhythm, no peripheral edema, warm, nontender. Eyes: Conjunctivae clear without exudates or hemorrhage Neck: Supple, no carotid bruise. Pulmonary: Clear to auscultation bilaterally   NEUROLOGICAL EXAM:  MENTAL STATUS: Speech:    Speech is normal; fluent and spontaneous with normal comprehension.  Cognition:     Orientation to time, place and person     Normal recent and remote memory     Normal Attention span and concentration     Normal Language, naming, repeating,spontaneous speech     Fund of knowledge   CRANIAL NERVES: CN II: Visual fields are full to confrontation. Fundoscopic exam is normal with sharp discs and no vascular changes. Pupils are round equal and briskly reactive to light. CN III, IV, VI: extraocular movement are normal. No ptosis. CN V: Facial sensation is intact to pinprick in all 3 divisions bilaterally. Corneal responses  are intact.  CN VII: Face is symmetric with normal eye closure and smile. CN VIII: Hearing is normal to rubbing fingers CN IX, X: Palate elevates symmetrically. Phonation is normal. CN XI: Head turning and shoulder shrug are intact CN XII: Tongue is midline with normal movements and no atrophy.  MOTOR: She has right upper extremity fixation on rapid rotating movement, she has moderate spasticity of bilateral lower extremity. bilateral lower extremity (right/left), hip flexion 4/4+, knee flexion 4/5, knee extension 4/5, ankle dorsiflexion 3/5 ankle plantar flexion 4+/5,    REFLEXES: Reflexes are 2+ and symmetric at the biceps, triceps, knees, and ankles. Plantar responses are flexor.  SENSORY: Intact to light touch, pinprick, position sense, and vibration sense are intact in fingers and toes.  COORDINATION: Rapid alternating movements and fine finger movements are intact. There is no dysmetria on finger-to-nose and heel-knee-shin.    GAIT/STANCE: She needs to push up from seated position, cautious, mildly stiff gait,  DIAGNOSTIC DATA (LABS, IMAGING, TESTING) - I reviewed patient records, labs, notes, testing and imaging myself where available.   ASSESSMENT AND PLAN   64 years old right-handed African American female,  Relapsing remitting multiple sclerosis,   The abnormalities are mainly at the cervical spine, minimal findings on MRI of the brain,  was treated with Betaseron from 1998 to 2011, developed abnormal liver function in the past, which has recovered after stopping Betaseron,    symptoms has been stable for many years.   She also had a history of gastric adenocarcinoma, status post gastrectomy, followed by chemotherapy and radiation therapy in 2001.  She is not on any long-term immunomodulation therapy now.  Bilateral lower extremity spasticity  Gait difficulty   Marcial Pacas, M.D. Ph.D.  [facility] [address] [place], [place]  [phone]

## 2014-11-17 ENCOUNTER — Ambulatory Visit (INDEPENDENT_AMBULATORY_CARE_PROVIDER_SITE_OTHER): Payer: Medicare Other | Admitting: Internal Medicine

## 2014-11-17 DIAGNOSIS — E538 Deficiency of other specified B group vitamins: Secondary | ICD-10-CM | POA: Diagnosis not present

## 2014-11-17 MED ORDER — CYANOCOBALAMIN 1000 MCG/ML IJ SOLN
1000.0000 ug | Freq: Once | INTRAMUSCULAR | Status: AC
  Administered 2014-11-17: 1000 ug via INTRAMUSCULAR

## 2014-12-11 ENCOUNTER — Ambulatory Visit (HOSPITAL_BASED_OUTPATIENT_CLINIC_OR_DEPARTMENT_OTHER): Payer: Medicare Other | Admitting: Hematology & Oncology

## 2014-12-11 ENCOUNTER — Ambulatory Visit (HOSPITAL_BASED_OUTPATIENT_CLINIC_OR_DEPARTMENT_OTHER): Payer: Medicare Other

## 2014-12-11 ENCOUNTER — Other Ambulatory Visit (HOSPITAL_BASED_OUTPATIENT_CLINIC_OR_DEPARTMENT_OTHER): Payer: Medicare Other

## 2014-12-11 ENCOUNTER — Encounter: Payer: Self-pay | Admitting: Hematology & Oncology

## 2014-12-11 VITALS — BP 120/47 | HR 67 | Resp 20

## 2014-12-11 VITALS — BP 165/61 | HR 69 | Temp 98.4°F | Resp 16 | Ht 64.0 in | Wt 100.0 lb

## 2014-12-11 DIAGNOSIS — G35 Multiple sclerosis: Secondary | ICD-10-CM | POA: Diagnosis not present

## 2014-12-11 DIAGNOSIS — D509 Iron deficiency anemia, unspecified: Secondary | ICD-10-CM | POA: Diagnosis not present

## 2014-12-11 DIAGNOSIS — K909 Intestinal malabsorption, unspecified: Secondary | ICD-10-CM | POA: Insufficient documentation

## 2014-12-11 DIAGNOSIS — Z85028 Personal history of other malignant neoplasm of stomach: Secondary | ICD-10-CM

## 2014-12-11 DIAGNOSIS — D500 Iron deficiency anemia secondary to blood loss (chronic): Secondary | ICD-10-CM

## 2014-12-11 HISTORY — DX: Intestinal malabsorption, unspecified: K90.9

## 2014-12-11 HISTORY — DX: Iron deficiency anemia, unspecified: D50.9

## 2014-12-11 LAB — COMPREHENSIVE METABOLIC PANEL
ALBUMIN: 3.6 g/dL (ref 3.5–5.0)
ALT: 14 U/L (ref 0–55)
AST: 27 U/L (ref 5–34)
Alkaline Phosphatase: 113 U/L (ref 40–150)
Anion Gap: 10 mEq/L (ref 3–11)
BUN: 13.5 mg/dL (ref 7.0–26.0)
CHLORIDE: 105 meq/L (ref 98–109)
CO2: 26 mEq/L (ref 22–29)
CREATININE: 1 mg/dL (ref 0.6–1.1)
Calcium: 9.3 mg/dL (ref 8.4–10.4)
EGFR: 71 mL/min/{1.73_m2} — ABNORMAL LOW (ref >90)
Glucose: 101 mg/dl (ref 70–140)
Potassium: 3.9 mEq/L (ref 3.5–5.1)
Sodium: 141 mEq/L (ref 136–145)
Total Bilirubin: 0.77 mg/dL (ref 0.20–1.20)
Total Protein: 7.3 g/dL (ref 6.4–8.3)

## 2014-12-11 LAB — CBC WITH DIFFERENTIAL (CANCER CENTER ONLY)
BASO#: 0 10*3/uL (ref 0.0–0.2)
BASO%: 0.5 % (ref 0.0–2.0)
EOS%: 3.7 % (ref 0.0–7.0)
Eosinophils Absolute: 0.1 10*3/uL (ref 0.0–0.5)
HEMATOCRIT: 36.3 % (ref 34.8–46.6)
HEMOGLOBIN: 11.5 g/dL — AB (ref 11.6–15.9)
LYMPH#: 1.8 10*3/uL (ref 0.9–3.3)
LYMPH%: 48.3 % — ABNORMAL HIGH (ref 14.0–48.0)
MCH: 28.3 pg (ref 26.0–34.0)
MCHC: 31.7 g/dL — ABNORMAL LOW (ref 32.0–36.0)
MCV: 89 fL (ref 81–101)
MONO#: 0.4 10*3/uL (ref 0.1–0.9)
MONO%: 10 % (ref 0.0–13.0)
NEUT%: 37.5 % — ABNORMAL LOW (ref 39.6–80.0)
NEUTROS ABS: 1.4 10*3/uL — AB (ref 1.5–6.5)
Platelets: 173 10*3/uL (ref 145–400)
RBC: 4.06 10*6/uL (ref 3.70–5.32)
RDW: 13.1 % (ref 11.1–15.7)
WBC: 3.8 10*3/uL — AB (ref 3.9–10.0)

## 2014-12-11 MED ORDER — SODIUM CHLORIDE 0.9 % IV SOLN
Freq: Once | INTRAVENOUS | Status: AC
  Administered 2014-12-11: 12:00:00 via INTRAVENOUS

## 2014-12-11 MED ORDER — FERUMOXYTOL INJECTION 510 MG/17 ML
510.0000 mg | Freq: Once | INTRAVENOUS | Status: AC
  Administered 2014-12-11: 510 mg via INTRAVENOUS
  Filled 2014-12-11: qty 17

## 2014-12-11 NOTE — Patient Instructions (Signed)

## 2014-12-11 NOTE — Progress Notes (Signed)
Hematology and Oncology Follow Up Visit  Erika Murphy [id number] 1950-04-11 64 y.o. 12/11/2014   Principle Diagnosis:   Stage II (T3N0M0) adenocarcinoma of the stomach-remission Recurrent iron deficiency anemia  Multiple sclerosis Current Therapy:    IV iron as indicated     Interim History:  [name] is back for follow-up. We last saw her back in June  She Is tired. She has felt tired since she had hip surgery. She fell and broke her right hip. This is back in August. She was at her sister's birthday party. Thankfully, she got through this fairly well. She did lose some blood. I'm sure that she is iron deficient.   she's not noted any obvious bleeding.  Her multiple sclerosis has been doing pretty well. She has not had any flareups.  She's had no cough or shortness of breath. Her appetite has been doing okay.  She cannot absorb oral iron because of her stomach surgery.  Overall, her performance status is ECOG 2.   Medications:  Current outpatient prescriptions:  .  albuterol (PROVENTIL HFA;VENTOLIN HFA) 108 (90 BASE) MCG/ACT inhaler, Inhale 2 puffs into the lungs every 6 (six) hours as needed for wheezing or shortness of breath., Disp: , Rfl:  .  aspirin EC 325 MG EC tablet, Take 1 tablet (325 mg total) by mouth 2 (two) times daily after a meal., Disp: 60 tablet, Rfl: 0 .  ciprofloxacin (CIPRO) 250 MG tablet, Take 2 tablets (500 mg total) by mouth 2 (two) times daily., Disp: 14 tablet, Rfl: 0 .  cyanocobalamin (,VITAMIN B-12,) 1000 MCG/ML injection, Inject 1,000 mcg into the muscle every 30 (thirty) days., Disp: , Rfl:  .  diazepam (VALIUM) 5 MG tablet, TAKE 1 TABLET BY MOUTH TWICE A DAY AS NEEDED, Disp: 180 tablet, Rfl: 0 .  ergocalciferol (VITAMIN D2) 50000 UNITS capsule, Take weekly x 12 weeks then take 2000 units Vitamin D3 daily after completing 12 weeks course., Disp: 4 capsule, Rfl: 3 .  HYDROcodone-acetaminophen (NORCO/VICODIN) 5-325 MG per tablet, Take 1-2 tablets by  mouth every 6 (six) hours as needed for moderate pain., Disp: 90 tablet, Rfl: 0 .  levofloxacin (LEVAQUIN) 250 MG tablet, Take 1 tablet (250 mg total) by mouth daily., Disp: 7 tablet, Rfl: 0 .  LYRICA 50 MG capsule, TAKE ONE CAPSULE BY MOUTH 3 TIMES A DAY, Disp: 90 capsule, Rfl: 1 .  pantoprazole (PROTONIX) 40 MG tablet, TAKE 1 TABLET (40 MG TOTAL) BY MOUTH DAILY., Disp: 90 tablet, Rfl: 3  Allergies: No Known Allergies  Past Medical History, Surgical history, Social history, and Family History were reviewed and updated.  Review of Systems: As above  Physical Exam:  height is 5\' 4"  (1.626 m) and weight is 100 lb (45.36 kg). Her oral temperature is 98.4 F (36.9 C). Her blood pressure is 165/61 and her pulse is 69. Her respiration is 16.   Thin African female. Lungs are clear. Cardiac exam regular rate and rhythm with no murmurs, rubs or bruits.. Abdomen is soft. She has good bowel sounds. There is no fluid wave. Has a well-healed laparotomy scar. There is no palpable liver or spleen tip. Back exam shows no tenderness over the spine ribs or hips. Extremities shows  symmetric muscle atrophy bilaterally. Skin exam no rashes, ecchymoses or petechia. Neurological exam is nonfocal.  Lab Results  Component Value Date   WBC 3.8* 12/11/2014   HGB 11.5* 12/11/2014   HCT 36.3 12/11/2014   MCV 89 12/11/2014   PLT  173 12/11/2014     Chemistry      Component Value Date/Time   NA 142 08/20/2014 0506   K 3.8 08/20/2014 0506   CL 110 08/20/2014 0506   CO2 29 08/20/2014 0506   BUN 14 08/20/2014 0506   CREATININE 1.00 08/20/2014 0506   CREATININE 1.11* 04/10/2014 1027      Component Value Date/Time   CALCIUM 8.1* 08/20/2014 0506   ALKPHOS 107 08/17/2014 1735   AST 30 08/17/2014 1735   ALT 20 08/17/2014 1735   BILITOT 0.9 08/17/2014 1735         Impression and Plan: Erika Murphy is a 64 year old African [place] female. She had a history of stage II stomach cancer. She underwent resection.  She had adjuvant therapy. This is now 17 years out from treatment. She is cured of the stomach cancer.  We will go ahead give her dose of iron today. I really believe that she easy iron. She does feel tired. Her hemoglobin is dropping slowly. Her MCV is slowly coming down. I think the iron will make her feel better. She cannot absorb oral iron because of her stomach surgery.    I will plan to see her back in 6 months.  Hopefully, the multiple sclerosis will not be a problem for her.   [name], MD 12/9/201612:11 PM

## 2014-12-14 LAB — FERRITIN: Ferritin: 30 ng/ml (ref 9–269)

## 2014-12-14 LAB — IRON AND TIBC
%SAT: 22 % (ref 21–57)
Iron: 87 ug/dL (ref 41–142)
TIBC: 391 ug/dL (ref 236–444)
UIBC: 305 ug/dL (ref 120–384)

## 2014-12-22 ENCOUNTER — Encounter: Payer: Self-pay | Admitting: Internal Medicine

## 2014-12-22 ENCOUNTER — Ambulatory Visit (INDEPENDENT_AMBULATORY_CARE_PROVIDER_SITE_OTHER): Payer: Medicare Other | Admitting: Internal Medicine

## 2014-12-22 VITALS — BP 138/84 | HR 64 | Temp 98.3°F | Resp 20 | Ht 64.0 in | Wt 99.0 lb

## 2014-12-22 DIAGNOSIS — E538 Deficiency of other specified B group vitamins: Secondary | ICD-10-CM

## 2014-12-22 MED ORDER — CYANOCOBALAMIN 1000 MCG/ML IJ SOLN
1000.0000 ug | Freq: Once | INTRAMUSCULAR | Status: AC
  Administered 2014-12-22: 1000 ug via INTRAMUSCULAR

## 2014-12-22 MED ORDER — PREDNISONE 10 MG PO TABS: ORAL_TABLET | ORAL | Status: DC

## 2015-01-01 ENCOUNTER — Encounter: Payer: Self-pay | Admitting: Internal Medicine

## 2015-01-01 NOTE — Patient Instructions (Signed)
Take prednisone DS 10 mg six-day Dosepak for chronic daily headache syndrome. Call if symptoms do not resolve. Return next month for office visit blood pressure check and B-12 injection

## 2015-01-01 NOTE — Progress Notes (Signed)
   Subjective:    Patient ID: Erika Murphy, female    DOB: [birth year], 64 y.o.   MRN: [id number]  HPI Patient was told to be evaluated recently regarding elevated blood pressure. She has developed almost chronic daily headache syndrome in the last few weeks. Blood pressure today is 138/84 and stable. She has a history of multiple sclerosis. History of B-12 deficiency related to gastrectomy for stomach cancer. Takes monthly B 12 injections. Seen by Dr. and over recently and blood pressure was 165/61 7 that was alarming to her.    Review of Systems     Objective:   Physical Exam Funduscopic exam is benign with this being sharp and flat bilaterally. No focal deficits on brief neurological exam. Skin is warm and dry. Nodes none. Chest clear to auscultation. Cardiac exam regular rate and rhythm normal S1 and S2. Extremities without edema.       Assessment & Plan:  Elevated blood pressure-blood pressure stable today. Only known elevation was at Dr. Griffith Citron office December 9. Continue to monitor  B-12 deficiency-1 mL IM B-12 given today  History of multiple sclerosis  Chronic daily headache-treat with Sterapred DS 10 mg 6 day dosepak

## 2015-01-26 ENCOUNTER — Ambulatory Visit (INDEPENDENT_AMBULATORY_CARE_PROVIDER_SITE_OTHER): Payer: Medicare Other | Admitting: Internal Medicine

## 2015-01-26 ENCOUNTER — Encounter: Payer: Self-pay | Admitting: Internal Medicine

## 2015-01-26 VITALS — BP 148/80 | HR 65 | Temp 98.2°F | Resp 20 | Ht 64.0 in | Wt 96.5 lb

## 2015-01-26 DIAGNOSIS — E538 Deficiency of other specified B group vitamins: Secondary | ICD-10-CM

## 2015-01-26 DIAGNOSIS — R634 Abnormal weight loss: Secondary | ICD-10-CM

## 2015-01-26 DIAGNOSIS — G35 Multiple sclerosis: Secondary | ICD-10-CM | POA: Diagnosis not present

## 2015-01-26 MED ORDER — CYANOCOBALAMIN 1000 MCG/ML IJ SOLN
1000.0000 ug | Freq: Once | INTRAMUSCULAR | Status: AC
  Administered 2015-01-26: 1000 ug via INTRAMUSCULAR

## 2015-01-26 NOTE — Patient Instructions (Signed)
Return in 4 weeks. Increase caloric intake. I believe blood pressure is elevated due to pain.

## 2015-01-26 NOTE — Progress Notes (Signed)
   Subjective:    Patient ID: Erika Murphy, female    DOB: 03-28-1950, 65 y.o.   MRN: [id number]  HPI Patient 's  blood pressure is elevated today but she is in pain. Only recently took Lyrica just prior to coming to office. Apparently did not take it on her recent trip to [place] to visit her daughter. Just returned from [place] this morning.    Review of Systems no complaint of diarrhea or trouble swallowing     Objective:   Physical Exam Not examined. Spent 15 minutes going over diet history with her. She eats oatmeal for breakfast. Yesterday  ate [place] buffet for lunch and took remainder home when she ate for supper. Erika Murphy it was a large portion. B-12 injection given today for B-12 deficiency related to subtotal gastrectomy for carcinoma. I'm concerned with 3 pound weight loss. She is 96 pounds today. In September she weighed 100 pounds in this office       Assessment & Plan:  Weight loss-3 pounds since last visit  Multiple sclerosis-not on treatment. Just takes Lyrica for pain.  B-12 deficiency-B-12 injection given today  History of gastric cancer  Elevated blood pressure-I think this is due to pain  Plan: Increase calories and return in 4 weeks for follow-up. Dr. Marin Murphy did lab work in December including CBC, C met, ferritin and iron level

## 2015-02-23 ENCOUNTER — Ambulatory Visit (INDEPENDENT_AMBULATORY_CARE_PROVIDER_SITE_OTHER): Payer: Medicare Other | Admitting: Internal Medicine

## 2015-02-23 ENCOUNTER — Encounter: Payer: Self-pay | Admitting: Internal Medicine

## 2015-02-23 VITALS — BP 128/68 | HR 70 | Temp 97.9°F | Resp 20 | Ht 64.0 in | Wt 99.0 lb

## 2015-02-23 DIAGNOSIS — G35 Multiple sclerosis: Secondary | ICD-10-CM | POA: Diagnosis not present

## 2015-02-23 DIAGNOSIS — E538 Deficiency of other specified B group vitamins: Secondary | ICD-10-CM

## 2015-02-23 DIAGNOSIS — R634 Abnormal weight loss: Secondary | ICD-10-CM

## 2015-02-23 MED ORDER — CYANOCOBALAMIN 1000 MCG/ML IJ SOLN
1000.0000 ug | Freq: Once | INTRAMUSCULAR | Status: AC
  Administered 2015-02-23: 1000 ug via INTRAMUSCULAR

## 2015-02-23 NOTE — Patient Instructions (Signed)
B-12 injection given. Congratulations on weight gain. Return in 4 weeks.

## 2015-02-23 NOTE — Progress Notes (Signed)
   Subjective:    Patient ID: Erika Murphy, female    DOB: 03-Mar-1950, 65 y.o.   MRN: [id number]  HPI Am pleased with her weight gain of 99 pounds. Previous weight was 96.8 pounds. Apparently she was following some low fat diet after seeing Dr. Marin Olp. Says she has liberalized her diet she has gained weight. She feels better. Her blood pressure is fine today. At last visit she was having a lot of pain and I think that's why blood pressure was elevated. She has no history of hypertension. B 12 injection given today.    Review of Systems     Objective:   Physical Exam  Not examined. B 12 1 mL IM given in office. She may want to learn how to do this at home with the help of family member since it may save her some money.       Assessment & Plan:  Loss of weight-has gained from 96.8 pounds to 99 pounds  B-12 deficiency-1 mL B-12 IM given  Multiple sclerosis-stable  Plan: Return in 4 weeks. A family member may want to come with her to learn how to give the IM B 12 injection

## 2015-03-23 ENCOUNTER — Ambulatory Visit: Payer: Medicare Other | Admitting: Internal Medicine

## 2015-03-30 ENCOUNTER — Ambulatory Visit (INDEPENDENT_AMBULATORY_CARE_PROVIDER_SITE_OTHER): Payer: Medicare Other | Admitting: Internal Medicine

## 2015-03-30 ENCOUNTER — Encounter: Payer: Self-pay | Admitting: Internal Medicine

## 2015-03-30 VITALS — BP 112/70

## 2015-03-30 DIAGNOSIS — E538 Deficiency of other specified B group vitamins: Secondary | ICD-10-CM

## 2015-03-30 MED ORDER — CYANOCOBALAMIN 1000 MCG/ML IJ SOLN: 1000.0000 ug | Freq: Once | INTRAMUSCULAR | Status: DC

## 2015-03-30 MED ORDER — CYANOCOBALAMIN 1000 MCG/ML IJ SOLN
1000.0000 ug | Freq: Once | INTRAMUSCULAR | Status: AC
  Administered 2015-03-30: 1000 ug via INTRAMUSCULAR

## 2015-03-30 NOTE — Patient Instructions (Signed)
Patient to give B-12 injections monthly at home.

## 2015-03-30 NOTE — Progress Notes (Signed)
   Subjective:    Patient ID: Erika Murphy, female    DOB: March 15, 1950, 65 y.o.   MRN: [id number]  HPI Here today for monthly B-12 injection. She is accompanied by her son, Hedy Camara. He is here to learn how to give her home B 12 injections monthly which will save her some money with office visits.    Review of Systems     Objective:   Physical Exam  Patient and her son were shown by Ann Held, CMA on how to draw up injection and give injection safely. She should alternate buttocks each month.      Assessment & Plan:  B-12 deficiency  Plan: Prescribed B-12 for home use and gave several syringes. Instructed the next injection due in 30 days.

## 2015-04-01 DIAGNOSIS — H524 Presbyopia: Secondary | ICD-10-CM | POA: Diagnosis not present

## 2015-04-01 DIAGNOSIS — H2513 Age-related nuclear cataract, bilateral: Secondary | ICD-10-CM | POA: Diagnosis not present

## 2015-04-01 DIAGNOSIS — G35 Multiple sclerosis: Secondary | ICD-10-CM | POA: Diagnosis not present

## 2015-04-01 DIAGNOSIS — H52223 Regular astigmatism, bilateral: Secondary | ICD-10-CM | POA: Diagnosis not present

## 2015-04-13 ENCOUNTER — Other Ambulatory Visit: Payer: Self-pay | Admitting: Internal Medicine

## 2015-05-03 ENCOUNTER — Telehealth: Payer: Self-pay | Admitting: Internal Medicine

## 2015-05-03 MED ORDER — ALBUTEROL SULFATE HFA 108 (90 BASE) MCG/ACT IN AERS: 2.0000 | INHALATION_SPRAY | Freq: Four times a day (QID) | RESPIRATORY_TRACT | Status: DC | PRN

## 2015-05-03 NOTE — Telephone Encounter (Signed)
Refill x one year °

## 2015-05-03 NOTE — Telephone Encounter (Signed)
Needs a refill on her Proventil inhaler.  Having a hard time with all of the allergies this season.    Pharmacy:  [facility] @ [address]

## 2015-06-11 ENCOUNTER — Other Ambulatory Visit (HOSPITAL_BASED_OUTPATIENT_CLINIC_OR_DEPARTMENT_OTHER): Payer: Medicare Other

## 2015-06-11 ENCOUNTER — Ambulatory Visit: Payer: Medicare Other

## 2015-06-11 ENCOUNTER — Encounter: Payer: Self-pay | Admitting: Hematology & Oncology

## 2015-06-11 ENCOUNTER — Ambulatory Visit (HOSPITAL_BASED_OUTPATIENT_CLINIC_OR_DEPARTMENT_OTHER): Payer: Medicare Other | Admitting: Hematology & Oncology

## 2015-06-11 VITALS — BP 130/69 | HR 75 | Temp 98.6°F | Resp 16 | Ht 64.0 in | Wt 99.0 lb

## 2015-06-11 DIAGNOSIS — K909 Intestinal malabsorption, unspecified: Secondary | ICD-10-CM

## 2015-06-11 DIAGNOSIS — G35 Multiple sclerosis: Secondary | ICD-10-CM

## 2015-06-11 DIAGNOSIS — D509 Iron deficiency anemia, unspecified: Secondary | ICD-10-CM | POA: Diagnosis not present

## 2015-06-11 DIAGNOSIS — D510 Vitamin B12 deficiency anemia due to intrinsic factor deficiency: Secondary | ICD-10-CM | POA: Diagnosis not present

## 2015-06-11 DIAGNOSIS — D002 Carcinoma in situ of stomach: Secondary | ICD-10-CM

## 2015-06-11 DIAGNOSIS — Z85028 Personal history of other malignant neoplasm of stomach: Secondary | ICD-10-CM | POA: Diagnosis not present

## 2015-06-11 DIAGNOSIS — E538 Deficiency of other specified B group vitamins: Secondary | ICD-10-CM

## 2015-06-11 LAB — COMPREHENSIVE METABOLIC PANEL
ALBUMIN: 3.8 g/dL (ref 3.5–5.0)
ALK PHOS: 111 U/L (ref 40–150)
ALT: 23 U/L (ref 0–55)
ANION GAP: 8 meq/L (ref 3–11)
AST: 30 U/L (ref 5–34)
BUN: 13.5 mg/dL (ref 7.0–26.0)
CALCIUM: 9.7 mg/dL (ref 8.4–10.4)
CHLORIDE: 105 meq/L (ref 98–109)
CO2: 29 mEq/L (ref 22–29)
Creatinine: 1.2 mg/dL — ABNORMAL HIGH (ref 0.6–1.1)
EGFR: 55 mL/min/{1.73_m2} — AB (ref >90)
Glucose: 76 mg/dl (ref 70–140)
POTASSIUM: 4.3 meq/L (ref 3.5–5.1)
Sodium: 142 mEq/L (ref 136–145)
Total Bilirubin: 1.03 mg/dL (ref 0.20–1.20)
Total Protein: 7.5 g/dL (ref 6.4–8.3)

## 2015-06-11 LAB — CBC WITH DIFFERENTIAL (CANCER CENTER ONLY)
BASO#: 0 10*3/uL (ref 0.0–0.2)
BASO%: 0.6 % (ref 0.0–2.0)
EOS ABS: 0.1 10*3/uL (ref 0.0–0.5)
EOS%: 2.3 % (ref 0.0–7.0)
HEMATOCRIT: 36.8 % (ref 34.8–46.6)
HGB: 12.1 g/dL (ref 11.6–15.9)
LYMPH#: 1.3 10*3/uL (ref 0.9–3.3)
LYMPH%: 35.8 % (ref 14.0–48.0)
MCH: 30.2 pg (ref 26.0–34.0)
MCHC: 32.9 g/dL (ref 32.0–36.0)
MCV: 92 fL (ref 81–101)
MONO#: 0.4 10*3/uL (ref 0.1–0.9)
MONO%: 11.3 % (ref 0.0–13.0)
NEUT#: 1.8 10*3/uL (ref 1.5–6.5)
NEUT%: 50 % (ref 39.6–80.0)
Platelets: 163 10*3/uL (ref 145–400)
RBC: 4.01 10*6/uL (ref 3.70–5.32)
RDW: 11.8 % (ref 11.1–15.7)
WBC: 3.6 10*3/uL — ABNORMAL LOW (ref 3.9–10.0)

## 2015-06-11 NOTE — Progress Notes (Signed)
Hematology and Oncology Follow Up Visit  KRISTALYNN BITNER [id number] [birth year] 65 y.o. 06/11/2015   Principle Diagnosis:   Stage II (T3N0M0) adenocarcinoma of the stomach-remission Recurrent iron deficiency anemia  Pernicious anemia Multiple sclerosis Current Therapy:    IV iron as indicated  Vitamin B-12 1 mg IM every month-done at home     Interim History:  Ms.  Sherron is back for follow-up. We last saw her back in December  She is feeling pretty good. She really does not feel all that tired. She has had no problems with her appetite although she cannot really gaining weight because she had part of her stomach taken out. If she does have some pernicious anemia. She gets B-12 shots at home.  She is going to [place] for 2 months. She will going the middle of July. She is looking for to this.  She has had no problems with flareups of her multiple sclerosis.  She has had no issues with nausea or vomiting.  Overall, her performance status is ECOG 1.    Medications:  Current outpatient prescriptions:  .  albuterol (PROVENTIL HFA;VENTOLIN HFA) 108 (90 Base) MCG/ACT inhaler, Inhale 2 puffs into the lungs every 6 (six) hours as needed for wheezing or shortness of breath., Disp: 1 Inhaler, Rfl: 12 .  cyanocobalamin (,VITAMIN B-12,) 1000 MCG/ML injection, Inject 1,000 mcg into the muscle every 30 (thirty) days., Disp: , Rfl:  .  cyanocobalamin (,VITAMIN B-12,) 1000 MCG/ML injection, Inject 1 mL (1,000 mcg total) into the muscle once., Disp: 10 mL, Rfl: 1 .  diazepam (VALIUM) 5 MG tablet, TAKE 1 TABLET BY MOUTH TWICE A DAY AS NEEDED, Disp: 180 tablet, Rfl: 0 .  LYRICA 50 MG capsule, TAKE ONE CAPSULE BY MOUTH 3 TIMES A DAY, Disp: 90 capsule, Rfl: 3 .  pantoprazole (PROTONIX) 40 MG tablet, TAKE 1 TABLET (40 MG TOTAL) BY MOUTH DAILY., Disp: 90 tablet, Rfl: 3  Allergies: No Known Allergies  Past Medical History, Surgical history, Social history, and Family History were reviewed and  updated.  Review of Systems: As above  Physical Exam:  height is 5\' 4"  (1.626 m) and weight is 99 lb (44.906 kg). Her oral temperature is 98.6 F (37 C). Her blood pressure is 130/69 and her pulse is 75. Her respiration is 16.   Thin African female. Lungs are clear. Cardiac exam regular rate and rhythm with no murmurs, rubs or bruits.. Abdomen is soft. She has good bowel sounds. There is no fluid wave. Has a well-healed laparotomy scar. There is no palpable liver or spleen tip. Back exam shows no tenderness over the spine ribs or hips. Extremities shows  symmetric muscle atrophy bilaterally. Skin exam no rashes, ecchymoses or petechia. Neurological exam is nonfocal.  Lab Results  Component Value Date   WBC 3.6* 06/11/2015   HGB 12.1 06/11/2015   HCT 36.8 06/11/2015   MCV 92 06/11/2015   PLT 163 06/11/2015     Chemistry      Component Value Date/Time   NA 141 12/11/2014 1104   NA 142 08/20/2014 0506   K 3.9 12/11/2014 1104   K 3.8 08/20/2014 0506   CL 110 08/20/2014 0506   CO2 26 12/11/2014 1104   CO2 29 08/20/2014 0506   BUN 13.5 12/11/2014 1104   BUN 14 08/20/2014 0506   CREATININE 1.0 12/11/2014 1104   CREATININE 1.00 08/20/2014 0506   CREATININE 1.11* 04/10/2014 1027      Component Value Date/Time   CALCIUM 9.3  12/11/2014 1104   CALCIUM 8.1* 08/20/2014 0506   ALKPHOS 113 12/11/2014 1104   ALKPHOS 107 08/17/2014 1735   AST 27 12/11/2014 1104   AST 30 08/17/2014 1735   ALT 14 12/11/2014 1104   ALT 20 08/17/2014 1735   BILITOT 0.77 12/11/2014 1104   BILITOT 0.9 08/17/2014 1735         Impression and Plan: Ms. Bilicki is a 65 year old African [place] female. She had a history of stage II stomach cancer. She underwent resection. She had adjuvant therapy. This is now 17 years out from treatment. She is cured of the stomach cancer.   I will plan to see her back in 6 months.  Hopefully, the multiple sclerosis will not be a problem for her.  I look forward to  hearing about her trip up to [place]. She used to goes once a year to see her family.   [name], MD 6/9/201712:50 PM

## 2015-06-11 NOTE — Progress Notes (Signed)
No treatment today per Dr Ennever 

## 2015-06-14 ENCOUNTER — Telehealth: Payer: Self-pay | Admitting: *Deleted

## 2015-06-14 LAB — IRON AND TIBC
%SAT: 30 % (ref 21–57)
IRON: 91 ug/dL (ref 41–142)
TIBC: 309 ug/dL (ref 236–444)
UIBC: 217 ug/dL (ref 120–384)

## 2015-06-14 LAB — FERRITIN: Ferritin: 166 ng/ml (ref 9–269)

## 2015-06-14 NOTE — Telephone Encounter (Addendum)
Patient is aware of results.   ----- Message from [name], MD sent at 06/14/2015 11:05 AM EDT ----- Call - iron level is ok!!!  pete

## 2015-07-22 ENCOUNTER — Telehealth: Payer: Self-pay | Admitting: Internal Medicine

## 2015-07-22 NOTE — Telephone Encounter (Signed)
Encounter opened in error

## 2015-09-07 ENCOUNTER — Other Ambulatory Visit: Payer: Self-pay | Admitting: Internal Medicine

## 2015-09-07 MED ORDER — PREGABALIN 50 MG PO CAPS: 50.0000 mg | ORAL_CAPSULE | Freq: Three times a day (TID) | ORAL | 1 refills | Status: DC

## 2015-09-07 NOTE — Addendum Note (Signed)
Addended by: Milta Deiters on: 09/07/2015 02:04 PM   Modules accepted: Orders

## 2015-09-26 ENCOUNTER — Other Ambulatory Visit: Payer: Self-pay | Admitting: Internal Medicine

## 2015-10-19 ENCOUNTER — Encounter: Payer: Self-pay | Admitting: Internal Medicine

## 2015-10-19 ENCOUNTER — Ambulatory Visit (INDEPENDENT_AMBULATORY_CARE_PROVIDER_SITE_OTHER): Payer: Medicare Other | Admitting: Internal Medicine

## 2015-10-19 VITALS — BP 138/82 | HR 70 | Temp 97.8°F | Wt 100.5 lb

## 2015-10-19 DIAGNOSIS — R413 Other amnesia: Secondary | ICD-10-CM | POA: Diagnosis not present

## 2015-10-19 DIAGNOSIS — M545 Low back pain, unspecified: Secondary | ICD-10-CM

## 2015-10-19 DIAGNOSIS — R300 Dysuria: Secondary | ICD-10-CM

## 2015-10-19 DIAGNOSIS — Z8500 Personal history of malignant neoplasm of unspecified digestive organ: Secondary | ICD-10-CM | POA: Diagnosis not present

## 2015-10-19 DIAGNOSIS — R030 Elevated blood-pressure reading, without diagnosis of hypertension: Secondary | ICD-10-CM

## 2015-10-19 DIAGNOSIS — G35 Multiple sclerosis: Secondary | ICD-10-CM | POA: Diagnosis not present

## 2015-10-19 DIAGNOSIS — R531 Weakness: Secondary | ICD-10-CM | POA: Diagnosis not present

## 2015-10-19 DIAGNOSIS — F05 Delirium due to known physiological condition: Secondary | ICD-10-CM

## 2015-10-19 DIAGNOSIS — E538 Deficiency of other specified B group vitamins: Secondary | ICD-10-CM

## 2015-10-19 LAB — POCT URINALYSIS DIPSTICK
BILIRUBIN UA: NEGATIVE
Blood, UA: NEGATIVE
Glucose, UA: NEGATIVE
Ketones, UA: NEGATIVE
LEUKOCYTES UA: NEGATIVE
NITRITE UA: NEGATIVE
PH UA: 5
Protein, UA: NEGATIVE
Spec Grav, UA: 1.015
UROBILINOGEN UA: 1

## 2015-10-19 MED ORDER — AMLODIPINE BESYLATE 5 MG PO TABS: ORAL_TABLET | ORAL | 1 refills | Status: DC

## 2015-10-19 NOTE — Progress Notes (Signed)
   Subjective:    Patient ID: Erika Murphy, female    DOB: Jan 16, 1950, 65 y.o.   MRN: [id number]  HPI  65 year old Black Female here for several issues. She says she  has been intermittently confused recently over past 3 weeks. Says side of face and right frontal area hurting. Says she has called people the wrong name. Says she does not remember saying some of the incorrect names, etc that family members says she does. Thinks BP may be elevated. Family Hx of HTN in twin sister.  Is to see neurologist tomorrow. Not sure if patient truly confused. Speaks appropriately today. Longstanding History of MS. Wants urine checked as she thinks a UTI could cause confusion. Urine is clear today. Has low back pain for which she has been takeing Aleve or Advil. Ambulating fairly well. Has gained some weight. Now has family member give her monthly B12 injection at home to save money.  Will check iron, folate B12 and TSH with history of confusion.  In February weighed 99 pounds.Now weighs 100.5 pounds.    Review of Systems as above     Objective:   Physical Exam  Constitutional: She is oriented to person, place, and time. No distress.  HENT:  Head: Normocephalic and atraumatic.  Neck: No JVD present. No thyromegaly present.  Cardiovascular: Normal rate, regular rhythm and normal heart sounds.   No murmur heard. Pulmonary/Chest: Effort normal and breath sounds normal. No respiratory distress. She has no wheezes.  Musculoskeletal: She exhibits no edema.  Lymphadenopathy:    She has no cervical adenopathy.  Neurological: She is alert and oriented to person, place, and time.  No gross focal deficits on brief Neuro exam  Skin: Skin is warm and dry. She is not diaphoretic.  Psychiatric: She has a normal mood and affect. Her behavior is normal. Judgment and thought content normal.  Vitals reviewed.         Assessment & Plan:  ? Intermittent confusion- related to MS?? Headache- no evidence of  hypertensive encephalopathy Elevated BP MS- does not want to be on treatment Low back pain- no evidence of UTI  Plan: Norvasc 5 mg daily and F/U in 4 weeks. Labs drawn including iron, folate B12 and TSH. Continue Aleve for back pain. Also has Lyrica.

## 2015-10-19 NOTE — Patient Instructions (Addendum)
Keep appointment with Neurology tomorrow. Urine specimen shows no evidence of infection. Start amlodipine 5 mg daily for labile blood pressure. Return in 4 weeks. Take Advil or Aleve for back pain. B-12, TSH and folate levels checked as well as iron level.

## 2015-10-20 ENCOUNTER — Encounter: Payer: Self-pay | Admitting: Neurology

## 2015-10-20 ENCOUNTER — Telehealth: Payer: Self-pay | Admitting: Internal Medicine

## 2015-10-20 ENCOUNTER — Ambulatory Visit (INDEPENDENT_AMBULATORY_CARE_PROVIDER_SITE_OTHER): Payer: Medicare Other | Admitting: Neurology

## 2015-10-20 ENCOUNTER — Telehealth: Payer: Self-pay | Admitting: *Deleted

## 2015-10-20 VITALS — BP 144/82 | HR 65 | Ht 64.0 in | Wt 99.5 lb

## 2015-10-20 DIAGNOSIS — D002 Carcinoma in situ of stomach: Secondary | ICD-10-CM

## 2015-10-20 DIAGNOSIS — E559 Vitamin D deficiency, unspecified: Secondary | ICD-10-CM | POA: Insufficient documentation

## 2015-10-20 DIAGNOSIS — G35 Multiple sclerosis: Secondary | ICD-10-CM

## 2015-10-20 LAB — CBC WITH DIFFERENTIAL/PLATELET
BASOS ABS: 32 {cells}/uL (ref 0–200)
Basophils Relative: 1 %
EOS ABS: 64 {cells}/uL (ref 15–500)
Eosinophils Relative: 2 %
HCT: 38.7 % (ref 35.0–45.0)
Hemoglobin: 12.5 g/dL (ref 11.7–15.5)
LYMPHS ABS: 1024 {cells}/uL (ref 850–3900)
Lymphocytes Relative: 32 %
MCH: 29.1 pg (ref 27.0–33.0)
MCHC: 32.3 g/dL (ref 32.0–36.0)
MCV: 90.2 fL (ref 80.0–100.0)
MONO ABS: 288 {cells}/uL (ref 200–950)
MPV: 11.2 fL (ref 7.5–12.5)
Monocytes Relative: 9 %
NEUTROS PCT: 56 %
Neutro Abs: 1792 cells/uL (ref 1500–7800)
PLATELETS: 174 10*3/uL (ref 140–400)
RBC: 4.29 MIL/uL (ref 3.80–5.10)
RDW: 13.2 % (ref 11.0–15.0)
WBC: 3.2 10*3/uL — ABNORMAL LOW (ref 3.8–10.8)

## 2015-10-20 LAB — IRON AND TIBC
%SAT: 30 % (ref 11–50)
IRON: 99 ug/dL (ref 45–160)
TIBC: 325 ug/dL (ref 250–450)
UIBC: 226 ug/dL (ref 125–400)

## 2015-10-20 LAB — FOLATE: Folate: 5.8 ng/mL (ref >5.4)

## 2015-10-20 LAB — VITAMIN B12: Vitamin B-12: 514 pg/mL (ref 200–1100)

## 2015-10-20 LAB — TSH: TSH: 1.18 m[IU]/L

## 2015-10-20 NOTE — Telephone Encounter (Signed)
Pt informed of lab results. 

## 2015-10-20 NOTE — Telephone Encounter (Signed)
Patient calls this morning and was at another Dr appointment and seen Norvasc on her chart.  States that she hasn't ever taken this medication before.  Advised that this is a new medication that was prescribed for her yesterday at her visit.  Patient states that she doesn't recall Dr. Renold Genta telling her that she would be starting this new medication.  Advised this medication has been sent to her pharmacy and is to be picked up and taken 1 time daily.  She is to start this medication and take it once daily and then return to our office in once month for re-check on her BP.    Patient verbalized understanding and clarification of these instructions on her Norvasc 5mg .

## 2015-10-20 NOTE — Progress Notes (Signed)
Chief Complaint  Patient presents with  . Multiple Sclerosis    She is here for her one year follow up.  She has increased gait difficulty with weather changes.  She uses a cane to assist with ambulation.  She has also noticed daily sharp, shooting pains in her head and ringing in her ears over the last three weeks.  She has been diagnosed with hypertension and she has not started her medication yet.      [facility]  PATIENT: Erika Murphy DOB: 1950/05/17  HISTORICAL  Erika Murphy is a 65 years old right-handed African American female, patient of Dr. Erling Cruz, followed up for multiple sclerosis, last clinical visit was Sep 2014  Her primary care physician is Dr. Tommie Ard Baxley  She was diagnosed with multiple sclerosis in 1998, presented with spastic gait disorder, was treated with Betaseron since 1998, which was stopped in 2011 due to abnormal liver functional test, her abnormal liver function test did recover back to normal after stopping Betaseron.  Her multiple sclerosis symptoms have been  fairly stable over years, there was no worsening of her symptoms without taking Betaseron and baclofen, she is only taking Lyrica 50 mg 3 times a day for neuropathic pain involving bilateral upper and lower extremities.  Since July 2014, she also had intermittent left shoulder radiating pain to her left lateral arm, left hand, sometimes woke her up from sleep, intermittent, there was no significant worsening of weakness.  She also had a history of left shoulder replacement in 2011,  She has a history of gastric adenocarcinoma, status post subtotal gastrectomy, partial omentectomy in March 20001, status post chemotherapy and radiation therapy.  Most recent MRI of the brain was in June 2013, small white matter hyperintensities are unchanged from 2010. No enhancing or restricted diffusion lesions are seen. These lesions are nonspecific and could be seen with multiple sclerosis or chronic  microvascular ischemia most abnormality is seen in MRI of cervical spine, most recently in 2011,  MRI scan of the cervical spine in 2011 showed prominent spondylitic changes from C4-6 with mild left sided foramnial stenosis at C4-5, C5-6 and bilateral foraminal stenosis at C6-7. Illdefined spinal cord hyperintensities at C5-6 and T2-3 likely represent remote age demyelinating plaque  She lives alone, ambulate with a cane, no bowel bladder incontinence.  She is dong well, still lives alone in a house with couple steps, using cane to ambulate, she never drive. She is taking Lyrica 50 mg 3 times a day for her upper and lower extremity pain, which has been very helpful, taking Valium 5 mg as needed,  Over the years, her multiple sclerosis symptoms has been very stable, there is no worsening of her gait problem.  She was admitted to the hospital in October 8th 2015, complaining of worsening gait difficulty, worsening right arm, and right leg weakness, deep achy pain, this was preceded by vomitting, loose stool for few days, was treated with IV Solu-Medrol for 3 days, repeat MRI of the brain, and cervical spine Oct 2015, showed no acute lesions.  She continued to ambulate with a cane, dragging her right leg, reported 80% improvement, receiving home physical therapy, which has been helpful, Tylenol has been helpful too  UPDATE Oct 21 2014: She fell, and the broke her right hip require hip replacement August 2016, completed physical therapy.  She has increased leg stiffiness, pain. Aleve prn helps.  UPDATE Oct 18th 2017: Today she comes in with a new complains of gradual onset  memory loss, difficulty remembering conversations, forgetting peoples name,  There was no family history of dementia, she just came back from the trip visiting her twin sister at [place], she continues to complains of mild memory loss  I personally reviewed MRI of the brain in 2015, only mild supratentorium white matter gliosis,  mild generalized atrophy,    I reviewed laboratory evaluation in October 2017, normal vitamin 123456, folic acid, TSH, iron panels, CBC showed mildly decreased WBC 3.2, hemoglobin of 12 point 5, she has a history of vitamin D deficiency, level was 22 in 2015, was 10 in 2016,  She had a previous history of vitamin D deficiency, but is not taking any vitamin D supplements.  REVIEW OF SYSTEMS: Full 14 system review of systems performed and notable only for weakness, walking difficulties  ALLERGIES: No Known Allergies  HOME MEDICATIONS: Outpatient Medications Prior to Visit  Medication Sig Dispense Refill  . albuterol (PROVENTIL HFA;VENTOLIN HFA) 108 (90 Base) MCG/ACT inhaler Inhale 2 puffs into the lungs every 6 (six) hours as needed for wheezing or shortness of breath. 1 Inhaler 12  . amLODipine (NORVASC) 5 MG tablet 1 tablet daily in a.m. for elevated blood pressure 30 tablet 1  . cyanocobalamin (,VITAMIN B-12,) 1000 MCG/ML injection Inject 1 mL (1,000 mcg total) into the muscle once. 10 mL 1  . diazepam (VALIUM) 5 MG tablet TAKE 1 TABLET BY MOUTH TWICE A DAY AS NEEDED 180 tablet 0  . pantoprazole (PROTONIX) 40 MG tablet TAKE 1 TABLET (40 MG TOTAL) BY MOUTH DAILY. 90 tablet 3  . pregabalin (LYRICA) 50 MG capsule Take 1 capsule (50 mg total) by mouth 3 (three) times daily. 90 capsule 1  . cyanocobalamin (,VITAMIN B-12,) 1000 MCG/ML injection Inject 1,000 mcg into the muscle every 30 (thirty) days.     No facility-administered medications prior to visit.     PAST MEDICAL HISTORY: Past Medical History:  Diagnosis Date  . Arthritis    "right shoulder" (10/09/2013)  . Asthma   . B12 deficiency anemia   . Fibromyalgia   . GERD (gastroesophageal reflux disease)   . History of blood transfusion 2001   "when I had my stomach cancer"  . Hypertension   . Hypothyroidism   . IBS (irritable bowel syndrome)   . Iron deficiency anemia 12/11/2014  . Malabsorption of iron 12/11/2014  . MS (multiple  sclerosis) ([place]) dx'd ~ 1998  . Neurogenic bladder   . Osteopenia   . Osteoporosis   . Stomach cancer ([place]) 2001  . Thyroid nodule    right    PAST SURGICAL HISTORY: Past Surgical History:  Procedure Laterality Date  . BIOPSY THYROID Right 1990's  . [place]; 1984  . JOINT REPLACEMENT    . OMENTECTOMY  2001   partial  . PARTIAL GASTRECTOMY  2001  . TONSILLECTOMY  ~ 1959  . TOTAL HIP ARTHROPLASTY Right 08/18/2014   Procedure: TOTAL HIP ARTHROPLASTY ANTERIOR APPROACH;  Surgeon: Rod Can, MD;  Location: [facility];  Service: Orthopedics;  Laterality: Right;  . TOTAL SHOULDER ARTHROPLASTY Left 2011    FAMILY HISTORY: Family History  Problem Relation Age of Onset  . Heart disease Mother   . Diabetes Father   . Stomach cancer Brother   . Multiple sclerosis Other     SOCIAL HISTORY:  Social History   Social History  . Marital status: Divorced    Spouse name: N/A  . Number of children: 3   . Years of education:  N/A   Occupational History  . DISABLED Disability   Social History Main Topics  . Smoking status: Never Smoker  . Smokeless tobacco: Never Used     Comment: NEVER USED TOBACCO  . Alcohol use No  . Drug use: No  . Sexual activity: No   Other Topics Concern  . Not on file   Social History Narrative   Patient is retired.    Patient is married with 3 children.   Patient drinks caffeine daily.      PHYSICAL EXAM    Vitals:   10/20/15 1154  BP: (!) 144/82  Pulse: 65  Weight: 99 lb 8 oz (45.1 kg)  Height: 5\' 4"  (1.626 m)     Body mass index is 17.08 kg/m.  PHYSICAL EXAMNIATION:  Gen: NAD, conversant, well nourised, obese, well groomed                     Cardiovascular: Regular rate rhythm, no peripheral edema, warm, nontender. Eyes: Conjunctivae clear without exudates or hemorrhage Neck: Supple, no carotid bruise. Pulmonary: Clear to auscultation bilaterally   NEUROLOGICAL EXAM:  MENTAL STATUS: Speech:    Speech is  normal; fluent and spontaneous with normal comprehension.  Cognition:     Orientation to time, place and person     Normal recent and remote memory     Normal Attention span and concentration     Normal Language, naming, repeating,spontaneous speech     Fund of knowledge   CRANIAL NERVES: CN II: Visual fields are full to confrontation. Fundoscopic exam is normal with sharp discs and no vascular changes. Pupils are round equal and briskly reactive to light. CN III, IV, VI: extraocular movement are normal. No ptosis. CN V: Facial sensation is intact to pinprick in all 3 divisions bilaterally. Corneal responses are intact.  CN VII: Face is symmetric with normal eye closure and smile. CN VIII: Hearing is normal to rubbing fingers CN IX, X: Palate elevates symmetrically. Phonation is normal. CN XI: Head turning and shoulder shrug are intact CN XII: Tongue is midline with normal movements and no atrophy.  MOTOR: She has right upper extremity fixation on rapid rotating movement, she has moderate spasticity of bilateral lower extremity. bilateral lower extremity (right/left), hip flexion 4/4+, knee flexion 4/5, knee extension 4/5, ankle dorsiflexion 3/5 ankle plantar flexion 4+/5,    REFLEXES: Reflexes are 2+ and symmetric at the biceps, triceps, knees, and ankles. Plantar responses are flexor.  SENSORY: Intact to light touch, pinprick, position sense, and vibration sense are intact in fingers and toes.  COORDINATION: Rapid alternating movements and fine finger movements are intact. There is no dysmetria on finger-to-nose and heel-knee-shin.    GAIT/STANCE: She needs to push up from seated position, cautious, mildly stiff gait,  DIAGNOSTIC DATA (LABS, IMAGING, TESTING) - I reviewed patient records, labs, notes, testing and imaging myself where available.   ASSESSMENT AND PLAN   65 years old right-handed African American female,  Relapsing remitting multiple sclerosis,   The  abnormalities are mainly at the cervical spine, minimal findings on MRI of the brain,  was treated with Betaseron from 1998 to 2011, developed abnormal liver function in the past, which has recovered after stopping Betaseron,    symptoms has been stable for many years.   She also had a history of gastric adenocarcinoma, status post gastrectomy, followed by chemotherapy and radiation therapy in 2001.  She is not on any long-term immunomodulation therapy now.  Bilateral lower extremity  spasticity Gait difficulty Memory loss  Which is consistent with her mild generalized atrophy,  No treatable cause identified Vitamin D deficiency  Repeat laboratory evaluation, I have advised her to take vitamin D3 1000 units daily  Marcial Pacas, M.D. Ph.D.  [facility] [address] [place],  [address] [phone]

## 2015-10-20 NOTE — Telephone Encounter (Signed)
-----   Message from Elby Showers, MD sent at 10/20/2015 10:20 AM EDT ----- Thyroid, B12, Iron, folate levels are OK

## 2015-10-21 ENCOUNTER — Ambulatory Visit: Payer: Medicare Other | Admitting: Neurology

## 2015-10-21 LAB — VITAMIN D 25 HYDROXY (VIT D DEFICIENCY, FRACTURES): Vit D, 25-Hydroxy: 14.7 ng/mL — ABNORMAL LOW (ref 30.0–100.0)

## 2015-10-21 LAB — C-REACTIVE PROTEIN: CRP: 0.6 mg/L (ref 0.0–4.9)

## 2015-10-21 LAB — SEDIMENTATION RATE: SED RATE: 15 mm/h (ref 0–40)

## 2015-11-01 ENCOUNTER — Encounter: Payer: Self-pay | Admitting: *Deleted

## 2015-11-01 ENCOUNTER — Telehealth: Payer: Self-pay | Admitting: Neurology

## 2015-11-01 NOTE — Telephone Encounter (Signed)
Pt requesting results on lab work. Please call and advise

## 2015-11-01 NOTE — Telephone Encounter (Signed)
Dr. Krista Blue reviewed her lab results.  WBC low but has been in the past - vitamin D low and needs to take supplement.  Returned call to patient - she has Vitamin D 2000 units at home but has been taking it inconsistently.  She will start taking it daily.

## 2015-11-15 ENCOUNTER — Encounter: Payer: Self-pay | Admitting: Internal Medicine

## 2015-11-15 ENCOUNTER — Telehealth: Payer: Self-pay | Admitting: Internal Medicine

## 2015-11-15 ENCOUNTER — Ambulatory Visit (INDEPENDENT_AMBULATORY_CARE_PROVIDER_SITE_OTHER): Payer: Medicare Other | Admitting: Internal Medicine

## 2015-11-15 VITALS — BP 128/70 | HR 79 | Temp 97.8°F | Ht 64.0 in | Wt 104.0 lb

## 2015-11-15 DIAGNOSIS — E538 Deficiency of other specified B group vitamins: Secondary | ICD-10-CM

## 2015-11-15 DIAGNOSIS — J069 Acute upper respiratory infection, unspecified: Secondary | ICD-10-CM

## 2015-11-15 DIAGNOSIS — G35 Multiple sclerosis: Secondary | ICD-10-CM

## 2015-11-15 DIAGNOSIS — I10 Essential (primary) hypertension: Secondary | ICD-10-CM

## 2015-11-15 MED ORDER — METHYLPREDNISOLONE ACETATE 80 MG/ML IJ SUSP
80.0000 mg | Freq: Once | INTRAMUSCULAR | Status: AC
  Administered 2015-11-15: 80 mg via INTRAMUSCULAR

## 2015-11-15 MED ORDER — AMOXICILLIN 500 MG PO CAPS: 500.0000 mg | ORAL_CAPSULE | Freq: Three times a day (TID) | ORAL | 0 refills | Status: DC

## 2015-11-15 MED ORDER — HYDROCODONE-HOMATROPINE 5-1.5 MG/5ML PO SYRP: 5.0000 mL | ORAL_SOLUTION | Freq: Three times a day (TID) | ORAL | 0 refills | Status: DC | PRN

## 2015-11-15 NOTE — Telephone Encounter (Signed)
Please call in Amoxicillin 500 mg tid x 10 days #30 capsules

## 2015-11-15 NOTE — Patient Instructions (Signed)
Depo-Medrol 80 mg IM. Levaquin 250 mg daily for 7 days. Rest and drink plenty of fluids. Continue Norvasc for hypertension and monthly B-12 injections.

## 2015-11-15 NOTE — Progress Notes (Signed)
   Subjective:    Patient ID: Erika Murphy, female    DOB: [birth year], 65 y.o.   MRN: [id number]  HPI   65 year old Black Female in for follow up of hypertension.  At last visit to neurologist, BP was 144/82.She is on Norvasc 5 mg daily. At that time there was some concern about memory loss. Says she forgot to pay her [place] and her lites got turned off recently. I am still a little concerned this could be a manifestation of MS or some other problem such as a recent stroke. I spoke with her about doing an MRI of the brain and she wants to hold off on that for now. She is currently not on medication for MS. We checked TSH and B-12 level recently.  Now with URI symptoms since last week. Has malaise fatigue cough and some slight shortness of breath. Pulse oximetry is normal.  Hx B12 deficiency has family member do monthly B12 injections.    Review of Systems see above. Congested cough. Hard to sleep at night due to cough. White sputum production.     Objective:   Physical Exam Blood pressure stable on current regimen of Norvasc 5 mg daily. Skin warm and dry. Nodes none. TMs are full but not red. Pharynx is clear. Neck is supple. Chest clear to auscultation without rales or wheezing       Assessment & Plan:  Acute URI  Plan:Levaquin 250 mg daily x 7 days, Hycodan syrup one tsp po q 8 hours prn cough. Depomedrol 80 mg IM.  Return in 2 weeks for flu vaccine. Call if not better next week.  Essential hypertension-stable on Norvasc 5 mg daily. She feels well on this medication. We'll continue it. Blood pressure has some lability.

## 2015-11-15 NOTE — Telephone Encounter (Signed)
Patient says the medication that was sent to the pharmacy is going to cost her to much out of pocket and she would like something different sent in.

## 2015-11-15 NOTE — Telephone Encounter (Signed)
Informed patient amoxicillin has been sent to pharmacy.

## 2015-11-16 ENCOUNTER — Ambulatory Visit: Payer: Medicare Other | Admitting: Internal Medicine

## 2015-11-17 ENCOUNTER — Telehealth: Payer: Self-pay | Admitting: Internal Medicine

## 2015-11-17 NOTE — Telephone Encounter (Signed)
Patient called to verify which medication was sent to pharmacy.  Advised her it was amoxicillin 500 mg.  Patient says the pharmacy did not have the rx on file.  Spoke with [facility] and they have amoxicillin ready for patient and it will cost $3.30.  Informed patient and she is going to pick it up.

## 2015-12-02 ENCOUNTER — Ambulatory Visit (INDEPENDENT_AMBULATORY_CARE_PROVIDER_SITE_OTHER): Payer: Medicare Other | Admitting: Internal Medicine

## 2015-12-02 DIAGNOSIS — Z23 Encounter for immunization: Secondary | ICD-10-CM

## 2015-12-02 NOTE — Progress Notes (Signed)
Flu vaccine given.

## 2015-12-02 NOTE — Patient Instructions (Signed)
Flu vaccine given.

## 2015-12-13 ENCOUNTER — Other Ambulatory Visit (HOSPITAL_BASED_OUTPATIENT_CLINIC_OR_DEPARTMENT_OTHER): Payer: Medicare Other

## 2015-12-13 ENCOUNTER — Ambulatory Visit (HOSPITAL_BASED_OUTPATIENT_CLINIC_OR_DEPARTMENT_OTHER): Payer: Medicare Other | Admitting: Hematology & Oncology

## 2015-12-13 ENCOUNTER — Encounter: Payer: Self-pay | Admitting: Hematology & Oncology

## 2015-12-13 ENCOUNTER — Encounter: Payer: Self-pay | Admitting: *Deleted

## 2015-12-13 ENCOUNTER — Other Ambulatory Visit: Payer: Self-pay | Admitting: Internal Medicine

## 2015-12-13 VITALS — BP 122/53 | HR 64 | Temp 97.9°F | Resp 16 | Ht 64.0 in | Wt 102.0 lb

## 2015-12-13 DIAGNOSIS — G35 Multiple sclerosis: Secondary | ICD-10-CM

## 2015-12-13 DIAGNOSIS — D509 Iron deficiency anemia, unspecified: Secondary | ICD-10-CM

## 2015-12-13 DIAGNOSIS — D510 Vitamin B12 deficiency anemia due to intrinsic factor deficiency: Secondary | ICD-10-CM

## 2015-12-13 DIAGNOSIS — E538 Deficiency of other specified B group vitamins: Secondary | ICD-10-CM

## 2015-12-13 DIAGNOSIS — D500 Iron deficiency anemia secondary to blood loss (chronic): Secondary | ICD-10-CM

## 2015-12-13 DIAGNOSIS — D002 Carcinoma in situ of stomach: Secondary | ICD-10-CM | POA: Diagnosis not present

## 2015-12-13 DIAGNOSIS — Z853 Personal history of malignant neoplasm of breast: Secondary | ICD-10-CM | POA: Diagnosis not present

## 2015-12-13 LAB — IRON AND TIBC
%SAT: 35 % (ref 21–57)
IRON: 109 ug/dL (ref 41–142)
TIBC: 310 ug/dL (ref 236–444)
UIBC: 201 ug/dL (ref 120–384)

## 2015-12-13 LAB — CBC WITH DIFFERENTIAL (CANCER CENTER ONLY)
BASO#: 0 10*3/uL (ref 0.0–0.2)
BASO%: 0.5 % (ref 0.0–2.0)
EOS%: 2.1 % (ref 0.0–7.0)
Eosinophils Absolute: 0.1 10*3/uL (ref 0.0–0.5)
HEMATOCRIT: 35.2 % (ref 34.8–46.6)
HEMOGLOBIN: 11.6 g/dL (ref 11.6–15.9)
LYMPH#: 1.1 10*3/uL (ref 0.9–3.3)
LYMPH%: 25 % (ref 14.0–48.0)
MCH: 30.1 pg (ref 26.0–34.0)
MCHC: 33 g/dL (ref 32.0–36.0)
MCV: 91 fL (ref 81–101)
MONO#: 0.4 10*3/uL (ref 0.1–0.9)
MONO%: 8.8 % (ref 0.0–13.0)
NEUT%: 63.6 % (ref 39.6–80.0)
NEUTROS ABS: 2.7 10*3/uL (ref 1.5–6.5)
Platelets: 159 10*3/uL (ref 145–400)
RBC: 3.85 10*6/uL (ref 3.70–5.32)
RDW: 12.3 % (ref 11.1–15.7)
WBC: 4.2 10*3/uL (ref 3.9–10.0)

## 2015-12-13 LAB — CMP (CANCER CENTER ONLY)
ALBUMIN: 3.5 g/dL (ref 3.3–5.5)
ALT(SGPT): 20 U/L (ref 10–47)
AST: 35 U/L (ref 11–38)
Alkaline Phosphatase: 86 U/L — ABNORMAL HIGH (ref 26–84)
BILIRUBIN TOTAL: 0.9 mg/dL (ref 0.20–1.60)
BUN, Bld: 14 mg/dL (ref 7–22)
CALCIUM: 8.9 mg/dL (ref 8.0–10.3)
CO2: 29 meq/L (ref 18–33)
Chloride: 107 mEq/L (ref 98–108)
Creat: 1.1 mg/dl (ref 0.6–1.2)
Glucose, Bld: 103 mg/dL (ref 73–118)
POTASSIUM: 3.9 meq/L (ref 3.3–4.7)
Sodium: 144 mEq/L (ref 128–145)
Total Protein: 7.2 g/dL (ref 6.4–8.1)

## 2015-12-13 LAB — FERRITIN: Ferritin: 127 ng/ml (ref 9–269)

## 2015-12-13 NOTE — Progress Notes (Signed)
Hematology and Oncology Follow Up Visit  Erika Murphy [id number] 1950/04/02 65 y.o. 12/13/2015   Principle Diagnosis:   Stage II (T3N0M0) adenocarcinoma of the stomach-remission Recurrent iron deficiency anemia  Pernicious anemia Multiple sclerosis Current Therapy:    IV iron as indicated  Vitamin B-12 1 mg IM every month-done at home     Interim History:  [name] is back for follow-up. We last saw her back in June. She is doing well. She was up in [place] at her sister's house for 2 months. She really had a good time.  She comes in today with a head of hair that is bright red and purple. This is so typical of her. I think it is great that she is feeling so young.  She has no fatigue. Her multiple sclerosis is not flared up. She has no nausea or vomiting. She has no cough. She's has no change in bowel or bladder habits.  We last saw her, her iron studies looked okay. Her ferritin was 166 with iron saturation of 30%. Her B-12 level was 514.  Overall, her performance status is ECOG 1.    Medications:  Current Outpatient Prescriptions:  .  albuterol (PROVENTIL HFA;VENTOLIN HFA) 108 (90 Base) MCG/ACT inhaler, Inhale 2 puffs into the lungs every 6 (six) hours as needed for wheezing or shortness of breath., Disp: 1 Inhaler, Rfl: 12 .  amLODipine (NORVASC) 5 MG tablet, 1 TABLET DAILY IN A.M. FOR ELEVATED BLOOD PRESSURE, Disp: 30 tablet, Rfl: 3 .  amoxicillin (AMOXIL) 500 MG capsule, Take 1 capsule (500 mg total) by mouth 3 (three) times daily., Disp: 30 capsule, Rfl: 0 .  Cholecalciferol (VITAMIN D3) 2000 units TABS, Take by mouth daily., Disp: , Rfl:  .  cyanocobalamin (,VITAMIN B-12,) 1000 MCG/ML injection, Inject 1 mL (1,000 mcg total) into the muscle once., Disp: 10 mL, Rfl: 1 .  diazepam (VALIUM) 5 MG tablet, TAKE 1 TABLET BY MOUTH TWICE A DAY AS NEEDED, Disp: 180 tablet, Rfl: 0 .  HYDROcodone-homatropine (HYCODAN) 5-1.5 MG/5ML syrup, Take 5 mLs by mouth every 8 (eight) hours as  needed for cough., Disp: 120 mL, Rfl: 0 .  pantoprazole (PROTONIX) 40 MG tablet, TAKE 1 TABLET (40 MG TOTAL) BY MOUTH DAILY., Disp: 90 tablet, Rfl: 3 .  pregabalin (LYRICA) 50 MG capsule, Take 1 capsule (50 mg total) by mouth 3 (three) times daily., Disp: 90 capsule, Rfl: 1  Allergies: No Known Allergies  Past Medical History, Surgical history, Social history, and Family History were reviewed and updated.  Review of Systems: As above  Physical Exam:  height is 5\' 4"  (1.626 m) and weight is 102 lb (46.3 kg). Her oral temperature is 97.9 F (36.6 C). Her blood pressure is 122/53 (abnormal) and her pulse is 64. Her respiration is 16.   Thin African female. Lungs are clear. Cardiac exam regular rate and rhythm with no murmurs, rubs or bruits.. Abdomen is soft. She has good bowel sounds. There is no fluid wave. Has a well-healed laparotomy scar. There is no palpable liver or spleen tip. Back exam shows no tenderness over the spine ribs or hips. Extremities shows  symmetric muscle atrophy bilaterally. Skin exam no rashes, ecchymoses or petechia. Neurological exam is nonfocal.  Lab Results  Component Value Date   WBC 4.2 12/13/2015   HGB 11.6 12/13/2015   HCT 35.2 12/13/2015   MCV 91 12/13/2015   PLT 159 12/13/2015     Chemistry      Component Value  Date/Time   NA 144 12/13/2015 1050   NA 142 06/11/2015 1156   K 3.9 12/13/2015 1050   K 4.3 06/11/2015 1156   CL 107 12/13/2015 1050   CO2 29 12/13/2015 1050   CO2 29 06/11/2015 1156   BUN 14 12/13/2015 1050   BUN 13.5 06/11/2015 1156   CREATININE 1.1 12/13/2015 1050   CREATININE 1.2 (H) 06/11/2015 1156      Component Value Date/Time   CALCIUM 8.9 12/13/2015 1050   CALCIUM 9.7 06/11/2015 1156   ALKPHOS 86 (H) 12/13/2015 1050   ALKPHOS 111 06/11/2015 1156   AST 35 12/13/2015 1050   AST 30 06/11/2015 1156   ALT 20 12/13/2015 1050   ALT 23 06/11/2015 1156   BILITOT 0.90 12/13/2015 1050   BILITOT 1.03 06/11/2015 1156          Impression and Plan: [name] is a 65 year old African [place] female. She had a history of stage II stomach cancer. She underwent resection. She had adjuvant therapy. This is now 18 years out from treatment. She is cured of the stomach cancer.  Her white cell count is down a little bit. I looked at her blood on the microscope. I really do not see anything that looked suspicious. This might be from her medications. Again we will have to watch this closely.   I will plan to see her back in 6 months.   [name], MD 12/11/201712:10 PM

## 2015-12-14 LAB — VITAMIN B12: Vitamin B12: 935 pg/mL (ref 232–1245)

## 2015-12-20 ENCOUNTER — Other Ambulatory Visit: Payer: Self-pay | Admitting: Internal Medicine

## 2015-12-20 MED ORDER — PREGABALIN 50 MG PO CAPS: 50.0000 mg | ORAL_CAPSULE | Freq: Three times a day (TID) | ORAL | 1 refills | Status: DC

## 2015-12-24 ENCOUNTER — Encounter: Payer: Self-pay | Admitting: Internal Medicine

## 2015-12-24 ENCOUNTER — Telehealth: Payer: Self-pay | Admitting: Internal Medicine

## 2015-12-24 NOTE — Telephone Encounter (Signed)
Patient called to say that she does not have a home blood pressure monitor. Has been having some sharp pains in her head and also her neck recently. Says that she saw neurologist recently. Was told she could have another MRI if she so desired but  did not have the study done. Has baclofen on hand which she took last night and did help her neck pain. Complains of sharp pains in one side of her neck. Denies any injury or trauma. Also occasional sharp stabbing pains in her head.  Patient says she will call after the first of the year for an appointment to be seen. I think she may need to have MRI studies of her neck and brain again if they've not been done in some time. Her memory is not as clear as it used to be in my opinion. I think her MS may be getting worse but she currently is not on any MS medication because she has declined due to side effects.  Says her blood pressure was okay at neurology office but does not have home blood pressure monitor.

## 2015-12-25 NOTE — Telephone Encounter (Signed)
See other note in chart. This is a duplicate.

## 2016-01-06 ENCOUNTER — Ambulatory Visit: Payer: Medicare Other | Admitting: Internal Medicine

## 2016-01-18 ENCOUNTER — Telehealth: Payer: Self-pay

## 2016-01-25 ENCOUNTER — Telehealth: Payer: Self-pay | Admitting: Internal Medicine

## 2016-01-25 ENCOUNTER — Ambulatory Visit (INDEPENDENT_AMBULATORY_CARE_PROVIDER_SITE_OTHER): Payer: Medicare Other | Admitting: Internal Medicine

## 2016-01-25 ENCOUNTER — Encounter: Payer: Self-pay | Admitting: Internal Medicine

## 2016-01-25 VITALS — BP 110/62 | HR 67 | Temp 98.3°F | Resp 12 | Wt 100.0 lb

## 2016-01-25 DIAGNOSIS — R519 Headache, unspecified: Secondary | ICD-10-CM

## 2016-01-25 DIAGNOSIS — R269 Unspecified abnormalities of gait and mobility: Secondary | ICD-10-CM | POA: Diagnosis not present

## 2016-01-25 DIAGNOSIS — H579 Unspecified disorder of eye and adnexa: Secondary | ICD-10-CM | POA: Diagnosis not present

## 2016-01-25 DIAGNOSIS — I10 Essential (primary) hypertension: Secondary | ICD-10-CM | POA: Diagnosis not present

## 2016-01-25 DIAGNOSIS — G35 Multiple sclerosis: Secondary | ICD-10-CM

## 2016-01-25 DIAGNOSIS — M8580 Other specified disorders of bone density and structure, unspecified site: Secondary | ICD-10-CM | POA: Diagnosis not present

## 2016-01-25 DIAGNOSIS — Z87898 Personal history of other specified conditions: Secondary | ICD-10-CM

## 2016-01-25 DIAGNOSIS — Z789 Other specified health status: Secondary | ICD-10-CM

## 2016-01-25 DIAGNOSIS — E538 Deficiency of other specified B group vitamins: Secondary | ICD-10-CM | POA: Diagnosis not present

## 2016-01-25 DIAGNOSIS — R51 Headache: Secondary | ICD-10-CM

## 2016-01-25 DIAGNOSIS — I951 Orthostatic hypotension: Secondary | ICD-10-CM

## 2016-01-25 DIAGNOSIS — N319 Neuromuscular dysfunction of bladder, unspecified: Secondary | ICD-10-CM | POA: Diagnosis not present

## 2016-01-25 DIAGNOSIS — Z85028 Personal history of other malignant neoplasm of stomach: Secondary | ICD-10-CM

## 2016-01-25 MED ORDER — AMLODIPINE BESYLATE 2.5 MG PO TABS: 2.5000 mg | ORAL_TABLET | Freq: Every day | ORAL | 1 refills | Status: DC

## 2016-01-25 NOTE — Progress Notes (Signed)
   Subjective:    Patient ID: LILLETTE RISSMILLER, female    DOB: June 19, 1950, 66 y.o.   MRN: [id number]  HPI Recently was diagnosed with hypertension and was placed on Norvasc 5 mg daily. Has developed some postural dizziness. Blood pressure drops 10 points when she stands up today. I am decreasing Norvasc from 5 mg daily to 2.5 mg daily. She had some lab work done by Dr. Marin Olp in December. Iron and iron-binding capacity were normal as was ferritin. Hemoglobin was 11.6 g. History of vitamin D deficiency. Have recommended vitamin D over-the-counter. She takes monthly B 12 injections status post gastrectomy for carcinoma of the stomach in 2001 which left her B 12 deficient.  Followed at [facility] for Multiple Sclerosis which was diagnosed by Dr. Erling Cruz a number of years ago. For a while she took Betaseron. She says she thought it caused some abnormalities with liver functions and she stopped taking it. He has done pretty well off of Betaseron until recently. Family, ended that they thought she was a bit confused at times. We had discussed doing a brain CT but she wanted to postpone it.  Now we are going ahead and do an MRI of her brain later this week. This is because of the issues with dizziness and right facial pain which she describes as being quite sharp.  Recently she noticed an eye problem with left lateral eye. She called and apparently was thought to have had an hordeolum. Was told to use warm hot compresses. However this lesion seems to be not on the lid itself but some extra conjunctival tissue in the left lateral eye area.  She will see ophthalmologist later this week.  She has had issues with weight loss. Her weight November was 104 pounds. Weight today is 100 pounds. She says her appetite is fairly good.  Has right parietal pain described as sharp  Review of Systems     Objective:   Physical Exam Appears to have some excess conjunctival tissue left lateral eye upper lid area.  Conjunctiva appears to be normal. 10 mm drop in blood pressure from sitting to standing. Chest clear. Cardiac exam regular rate and rhythm. Extremities without edema. She ambulates with a cane.       Assessment & Plan:   Vertigo-Could be due to MS versus postural hypotension. Schedule MRI of the brain  Right facial pain-possibly related to MS proceed with MRI of the brain. Possible trigeminal neuralgia?Burnis Medin likely refer back to neurologist after MRI is completed.  Hx multiple sclerosis-  not on therapy at present  HTN-With postural hypotension. Decrease Norvasc from 5-2.5 mg daily and follow-up in 3 weeks.  Weight loss-continue to monitor. Has been an issue for several years now  B 12 deficiency-gets monthly B-12 injections at home and level has been checked recently.  ? Eye lesion left lateral eye-to see ophthalmologist later this week

## 2016-01-25 NOTE — Telephone Encounter (Signed)
Note completed 

## 2016-01-25 NOTE — Patient Instructions (Signed)
To see ophthalmologist later this week and to have MRI of the brain on Saturday. Decreased Norvasc from 5-2.5 mg daily and follow-up in 2-3 weeks.

## 2016-01-25 NOTE — Telephone Encounter (Signed)
Patient scheduled for MRI with contrast.  BUN/Creatinine will be drawn at [place] at the time of the appointment per Judson Roch in scheduling at [facility]  They could not use the December labs because they have to be 30 days from the day of the procedure.  Patient to arrive at 5:30 p.m. On Saturday, 01/29/16.    CT @ [address]  Patient aware of location and time/date.    Also made appointment for patient at [place].  Appointment with Dr. Kathlen Mody.  Appointment for Friday, 01/28/16 @ 12:35 p.m.  They are asking for notes to be faxed related to the growth on the eye.  Spoke with Jackelyn Poling at [facility].  Fax notes to [phone].  Patient aware of this appointment/day/time and location.

## 2016-01-26 NOTE — Telephone Encounter (Signed)
Faxed notes and insurance card to Dr. Kathlen Mody at [place] this morning at [phone].

## 2016-01-28 DIAGNOSIS — H01003 Unspecified blepharitis right eye, unspecified eyelid: Secondary | ICD-10-CM | POA: Diagnosis not present

## 2016-01-29 ENCOUNTER — Ambulatory Visit
Admission: RE | Admit: 2016-01-29 | Discharge: 2016-01-29 | Disposition: A | Discharge status: Home / Self Care | Payer: Medicare Other | Source: Ambulatory Visit | Attending: Internal Medicine | Admitting: Internal Medicine

## 2016-01-29 DIAGNOSIS — G35 Multiple sclerosis: Secondary | ICD-10-CM

## 2016-01-29 DIAGNOSIS — R42 Dizziness and giddiness: Secondary | ICD-10-CM | POA: Diagnosis not present

## 2016-01-29 MED ORDER — GADOBENATE DIMEGLUMINE 529 MG/ML IV SOLN
10.0000 mL | Freq: Once | INTRAVENOUS | Status: AC | PRN
  Administered 2016-01-29: 10 mL via INTRAVENOUS

## 2016-01-31 ENCOUNTER — Encounter: Payer: Self-pay | Admitting: Internal Medicine

## 2016-02-29 ENCOUNTER — Other Ambulatory Visit: Payer: Self-pay | Admitting: Internal Medicine

## 2016-02-29 NOTE — Telephone Encounter (Signed)
Call-refill for 6 months. Cannot be e- prescribed

## 2016-03-02 NOTE — Telephone Encounter (Signed)
Called [facility] @ [phone] and refilled Lyrica 50mg  #90 with 5 refills per verbal order by Dr. Renold Genta.

## 2016-03-31 ENCOUNTER — Encounter: Payer: Self-pay | Admitting: Internal Medicine

## 2016-03-31 ENCOUNTER — Telehealth: Payer: Self-pay | Admitting: Internal Medicine

## 2016-03-31 NOTE — Telephone Encounter (Signed)
Patient called asking when her appointment is for physical exam. Is not until May 8. She has appointment mid April with neurologist. She's been having numbness in right fingers and right arm. Advise patient should discuss this with neurologist at next visit. She may need nerve conduction studies. Currently not on any medication for multiple sclerosis.

## 2016-04-07 DIAGNOSIS — H25013 Cortical age-related cataract, bilateral: Secondary | ICD-10-CM | POA: Diagnosis not present

## 2016-04-07 DIAGNOSIS — G35 Multiple sclerosis: Secondary | ICD-10-CM | POA: Diagnosis not present

## 2016-04-07 DIAGNOSIS — H40013 Open angle with borderline findings, low risk, bilateral: Secondary | ICD-10-CM | POA: Diagnosis not present

## 2016-04-07 DIAGNOSIS — H2513 Age-related nuclear cataract, bilateral: Secondary | ICD-10-CM | POA: Diagnosis not present

## 2016-04-19 ENCOUNTER — Encounter: Payer: Self-pay | Admitting: Neurology

## 2016-04-19 ENCOUNTER — Ambulatory Visit (INDEPENDENT_AMBULATORY_CARE_PROVIDER_SITE_OTHER): Payer: Medicare Other | Admitting: Neurology

## 2016-04-19 VITALS — BP 120/76 | HR 75 | Resp 14 | Ht 64.0 in | Wt 102.0 lb

## 2016-04-19 DIAGNOSIS — R269 Unspecified abnormalities of gait and mobility: Secondary | ICD-10-CM

## 2016-04-19 DIAGNOSIS — G5601 Carpal tunnel syndrome, right upper limb: Secondary | ICD-10-CM | POA: Diagnosis not present

## 2016-04-19 DIAGNOSIS — G35 Multiple sclerosis: Secondary | ICD-10-CM | POA: Diagnosis not present

## 2016-04-19 NOTE — Progress Notes (Signed)
Chief Complaint  Patient presents with  . Follow-up    Patient states that she has new onset of R shoulder pain and numbness in her hands. Also has new onset of buttocks pain on R side.       [facility]  PATIENT: Erika Murphy DOB: 01-12-1950  HISTORICAL  Erika Murphy is a 66 years old right-handed African American female, patient of Dr. Erling Cruz, followed up for multiple sclerosis, last clinical visit was Sep 2014  Her primary care physician is Dr. Tommie Ard Baxley  She was diagnosed with multiple sclerosis in 1998, presented with spastic gait disorder, was treated with Betaseron since 1998, which was stopped in 2011 due to abnormal liver functional test, her abnormal liver function test did recover back to normal after stopping Betaseron.  Her multiple sclerosis symptoms have been  fairly stable over years, there was no worsening of her symptoms without taking Betaseron and baclofen, she is only taking Lyrica 50 mg 3 times a day for neuropathic pain involving bilateral upper and lower extremities.  Since July 2014, she also had intermittent left shoulder radiating pain to her left lateral arm, left hand, sometimes woke her up from sleep, intermittent, there was no significant worsening of weakness.  She also had a history of left shoulder replacement in 2011,  She has a history of gastric adenocarcinoma, status post subtotal gastrectomy, partial omentectomy in March 20001, status post chemotherapy and radiation therapy.  Most recent MRI of the brain was in June 2013, small white matter hyperintensities are unchanged from 2010. No enhancing or restricted diffusion lesions are seen. These lesions are nonspecific and could be seen with multiple sclerosis or chronic microvascular ischemia most abnormality is seen in MRI of cervical spine, most recently in 2011,  MRI scan of the cervical spine in 2011 showed prominent spondylitic changes from C4-6 with mild left sided foramnial  stenosis at C4-5, C5-6 and bilateral foraminal stenosis at C6-7. Illdefined spinal cord hyperintensities at C5-6 and T2-3 likely represent remote age demyelinating plaque  She lives alone, ambulate with a cane, no bowel bladder incontinence.  She is dong well, still lives alone in a house with couple steps, using cane to ambulate, she never drive. She is taking Lyrica 50 mg 3 times a day for her upper and lower extremity pain, which has been very helpful, taking Valium 5 mg as needed,  Over the years, her multiple sclerosis symptoms has been very stable, there is no worsening of her gait problem.  She was admitted to the hospital in October 8th 2015, complaining of worsening gait difficulty, worsening right arm, and right leg weakness, deep achy pain, this was preceded by vomitting, loose stool for few days, was treated with IV Solu-Medrol for 3 days, repeat MRI of the brain, and cervical spine Oct 2015, showed no acute lesions.  She continued to ambulate with a cane, dragging her right leg, reported 80% improvement, receiving home physical therapy, which has been helpful, Tylenol has been helpful too  UPDATE Oct 21 2014: She fell, and the broke her right hip require hip replacement August 2016, completed physical therapy.  She has increased leg stiffiness, pain. Aleve prn helps.  UPDATE Oct 18th 2017: Today she comes in with a new complains of gradual onset memory loss, difficulty remembering conversations, forgetting peoples name,  There was no family history of dementia, she just came back from the trip visiting her twin sister at [place], she continues to complains of mild memory loss  I personally reviewed MRI of the brain in 2015, only mild supratentorium white matter gliosis, mild generalized atrophy,    I reviewed laboratory evaluation in October 2017, normal vitamin D17, folic acid, TSH, iron panels, CBC showed mildly decreased WBC 3.2, hemoglobin of 12 point 5, she has a history of  vitamin D deficiency, level was 22 in 2015, was 10 in 2016,  She had a previous history of vitamin D deficiency, but is not taking any vitamin D supplements.  UPDATE April 19 2016: She has numbness in her right fingers, pain at her right arm, radiating pain from right neck to right shoulder, right arm, she also has right low back pain, radiating pain to right buttock, she use cane at her right hand, sometimes right finger numbness woke her up from sleep.  We have personally reviewed MRI of the brain in January 2018, few T2/FLAIR hyperintensity lesion, stable from 2015, MRI cervical spine in 2015, multilevel mild degenerative changes, chronic C5-6 lesion, stable, she also complains of right foot weakness, intermittent low back pain.   REVIEW OF SYSTEMS: Full 14 system review of systems performed and notable only for numbness, neck pain, neck stiffness,  ALLERGIES: No Known Allergies  HOME MEDICATIONS: Outpatient Medications Prior to Visit  Medication Sig Dispense Refill  . albuterol (PROVENTIL HFA;VENTOLIN HFA) 108 (90 Base) MCG/ACT inhaler Inhale 2 puffs into the lungs every 6 (six) hours as needed for wheezing or shortness of breath. 1 Inhaler 12  . amLODipine (NORVASC) 2.5 MG tablet Take 1 tablet (2.5 mg total) by mouth daily. 30 tablet 1  . Cholecalciferol (VITAMIN D3) 2000 units TABS Take by mouth daily.    . cyanocobalamin (,VITAMIN B-12,) 1000 MCG/ML injection Inject 1 mL (1,000 mcg total) into the muscle once. 10 mL 1  . diazepam (VALIUM) 5 MG tablet TAKE 1 TABLET BY MOUTH TWICE A DAY AS NEEDED 180 tablet 0  . HYDROcodone-homatropine (HYCODAN) 5-1.5 MG/5ML syrup Take 5 mLs by mouth every 8 (eight) hours as needed for cough. 120 mL 0  . pantoprazole (PROTONIX) 40 MG tablet TAKE 1 TABLET (40 MG TOTAL) BY MOUTH DAILY. 90 tablet 3  . pregabalin (LYRICA) 50 MG capsule Take 1 capsule (50 mg total) by mouth 3 (three) times daily. 90 capsule 1   No facility-administered medications prior to  visit.     PAST MEDICAL HISTORY: Past Medical History:  Diagnosis Date  . Arthritis    "right shoulder" (10/09/2013)  . Asthma   . B12 deficiency anemia   . Fibromyalgia   . GERD (gastroesophageal reflux disease)   . History of blood transfusion 2001   "when I had my stomach cancer"  . Hypertension   . Hypothyroidism   . IBS (irritable bowel syndrome)   . Iron deficiency anemia 12/11/2014  . Malabsorption of iron 12/11/2014  . MS (multiple sclerosis) ([place]) dx'd ~ 1998  . Neurogenic bladder   . Osteopenia   . Osteoporosis   . Stomach cancer ([place]) 2001  . Thyroid nodule    right    PAST SURGICAL HISTORY: Past Surgical History:  Procedure Laterality Date  . BIOPSY THYROID Right 1990's  . [place]; 1984  . JOINT REPLACEMENT    . OMENTECTOMY  2001   partial  . PARTIAL GASTRECTOMY  2001  . TONSILLECTOMY  ~ 1959  . TOTAL HIP ARTHROPLASTY Right 08/18/2014   Procedure: TOTAL HIP ARTHROPLASTY ANTERIOR APPROACH;  Surgeon: Rod Can, MD;  Location: [facility];  Service: Orthopedics;  Laterality: Right;  . TOTAL SHOULDER ARTHROPLASTY Left 2011    FAMILY HISTORY: Family History  Problem Relation Age of Onset  . Heart disease Mother   . Diabetes Father   . Stomach cancer Brother   . Multiple sclerosis Other     SOCIAL HISTORY:  Social History   Social History  . Marital status: Divorced    Spouse name: N/A  . Number of children: 3   . Years of education: N/A   Occupational History  . DISABLED Disability   Social History Main Topics  . Smoking status: Never Smoker  . Smokeless tobacco: Never Used     Comment: NEVER USED TOBACCO  . Alcohol use No  . Drug use: No  . Sexual activity: No   Other Topics Concern  . Not on file   Social History Narrative   Patient is retired.    Patient is married with 3 children.   Patient drinks caffeine daily.      PHYSICAL EXAM    Vitals:   04/19/16 1206  BP: 120/76  Pulse: 75  Resp: 14  Weight: 102 lb  (46.3 kg)  Height: 5\' 4"  (1.626 m)     Body mass index is 17.51 kg/m.  PHYSICAL EXAMNIATION:  Gen: NAD, conversant, well nourised, obese, well groomed                     Cardiovascular: Regular rate rhythm, no peripheral edema, warm, nontender. Eyes: Conjunctivae clear without exudates or hemorrhage Neck: Supple, no carotid bruise. Pulmonary: Clear to auscultation bilaterally   NEUROLOGICAL EXAM:  MENTAL STATUS: Speech:    Speech is normal; fluent and spontaneous with normal comprehension.  Cognition:     Orientation to time, place and person     Normal recent and remote memory     Normal Attention span and concentration     Normal Language, naming, repeating,spontaneous speech     Fund of knowledge   CRANIAL NERVES: CN II: Visual fields are full to confrontation. Fundoscopic exam is normal with sharp discs and no vascular changes. Pupils are round equal and briskly reactive to light. CN III, IV, VI: extraocular movement are normal. No ptosis. CN V: Facial sensation is intact to pinprick in all 3 divisions bilaterally. Corneal responses are intact.  CN VII: Face is symmetric with normal eye closure and smile. CN VIII: Hearing is normal to rubbing fingers CN IX, X: Palate elevates symmetrically. Phonation is normal. CN XI: Head turning and shoulder shrug are intact CN XII: Tongue is midline with normal movements and no atrophy.  MOTOR: She has right upper extremity fixation on rapid rotating movement, she has moderate spasticity of bilateral lower extremity. bilateral lower extremity (right/left), hip flexion 4/4+, knee flexion 4/5, knee extension 4/5, ankle dorsiflexion 3/5 ankle plantar flexion 4+/5,    REFLEXES: Reflexes are 2+ and symmetric at the biceps, triceps, knees, and ankles. Plantar responses are flexor.  SENSORY: Intact to light touch, pinprick, position sense, and vibration sense are intact in fingers and toes.  COORDINATION: Rapid alternating movements  and fine finger movements are intact. There is no dysmetria on finger-to-nose and heel-knee-shin.    GAIT/STANCE: She needs to push up from seated position, cautious, mildly stiff gait,  DIAGNOSTIC DATA (LABS, IMAGING, TESTING) - I reviewed patient records, labs, notes, testing and imaging myself where available.   ASSESSMENT AND PLAN   66 years old right-handed African American female,  Relapsing remitting multiple sclerosis,   The abnormalities  are mainly at the cervical spine, minimal findings on MRI of the brain,  was treated with Betaseron from 1998 to 2011, developed abnormal liver function in the past, which has recovered after stopping Betaseron,    symptoms has been stable for many years.   She also had a history of gastric adenocarcinoma, status post gastrectomy, followed by chemotherapy and radiation therapy in 2001.  She is not on any long-term immunomodulation therapy now.  Bilateral lower extremity spasticity,  Gait difficulty Memory loss  Which is consistent with her mild generalized atrophy,  No treatable cause identified New-onset right hand paresthesia   suggestive of right carpal tunnel syndromes,  Proceed with EMG nerve conduction study  Right wrist splint,  Marcial Pacas, M.D. Ph.D.  [facility] [address] [place], [place] [address] [phone]

## 2016-04-28 ENCOUNTER — Other Ambulatory Visit: Payer: Self-pay | Admitting: Internal Medicine

## 2016-05-09 ENCOUNTER — Ambulatory Visit (INDEPENDENT_AMBULATORY_CARE_PROVIDER_SITE_OTHER): Payer: Medicare Other | Admitting: Internal Medicine

## 2016-05-09 ENCOUNTER — Encounter: Payer: Self-pay | Admitting: Internal Medicine

## 2016-05-09 VITALS — BP 110/60 | HR 69 | Temp 97.8°F | Ht 65.0 in | Wt 101.0 lb

## 2016-05-09 DIAGNOSIS — I10 Essential (primary) hypertension: Secondary | ICD-10-CM

## 2016-05-09 DIAGNOSIS — R269 Unspecified abnormalities of gait and mobility: Secondary | ICD-10-CM

## 2016-05-09 DIAGNOSIS — Z13220 Encounter for screening for lipoid disorders: Secondary | ICD-10-CM

## 2016-05-09 DIAGNOSIS — R413 Other amnesia: Secondary | ICD-10-CM

## 2016-05-09 DIAGNOSIS — E559 Vitamin D deficiency, unspecified: Secondary | ICD-10-CM | POA: Diagnosis not present

## 2016-05-09 DIAGNOSIS — Z8639 Personal history of other endocrine, nutritional and metabolic disease: Secondary | ICD-10-CM

## 2016-05-09 DIAGNOSIS — M8580 Other specified disorders of bone density and structure, unspecified site: Secondary | ICD-10-CM | POA: Diagnosis not present

## 2016-05-09 DIAGNOSIS — E538 Deficiency of other specified B group vitamins: Secondary | ICD-10-CM

## 2016-05-09 DIAGNOSIS — R200 Anesthesia of skin: Secondary | ICD-10-CM

## 2016-05-09 DIAGNOSIS — G35 Multiple sclerosis: Secondary | ICD-10-CM | POA: Diagnosis not present

## 2016-05-09 DIAGNOSIS — Z1329 Encounter for screening for other suspected endocrine disorder: Secondary | ICD-10-CM | POA: Diagnosis not present

## 2016-05-09 DIAGNOSIS — R8290 Unspecified abnormal findings in urine: Secondary | ICD-10-CM

## 2016-05-09 DIAGNOSIS — Z87898 Personal history of other specified conditions: Secondary | ICD-10-CM

## 2016-05-09 DIAGNOSIS — R202 Paresthesia of skin: Secondary | ICD-10-CM

## 2016-05-09 DIAGNOSIS — Z0000 Encounter for general adult medical examination without abnormal findings: Secondary | ICD-10-CM

## 2016-05-09 DIAGNOSIS — Z85028 Personal history of other malignant neoplasm of stomach: Secondary | ICD-10-CM

## 2016-05-09 DIAGNOSIS — N319 Neuromuscular dysfunction of bladder, unspecified: Secondary | ICD-10-CM

## 2016-05-09 DIAGNOSIS — R5383 Other fatigue: Secondary | ICD-10-CM | POA: Diagnosis not present

## 2016-05-09 DIAGNOSIS — E785 Hyperlipidemia, unspecified: Secondary | ICD-10-CM | POA: Diagnosis not present

## 2016-05-09 LAB — COMPREHENSIVE METABOLIC PANEL
ALBUMIN: 3.8 g/dL (ref 3.6–5.1)
ALT: 13 U/L (ref 6–29)
AST: 24 U/L (ref 10–35)
Alkaline Phosphatase: 90 U/L (ref 33–130)
BUN: 13 mg/dL (ref 7–25)
CHLORIDE: 104 mmol/L (ref 98–110)
CO2: 31 mmol/L (ref 20–31)
Calcium: 9.2 mg/dL (ref 8.6–10.4)
Creat: 0.95 mg/dL (ref 0.50–0.99)
Glucose, Bld: 83 mg/dL (ref 65–99)
POTASSIUM: 4.4 mmol/L (ref 3.5–5.3)
Sodium: 142 mmol/L (ref 135–146)
TOTAL PROTEIN: 6.9 g/dL (ref 6.1–8.1)
Total Bilirubin: 0.7 mg/dL (ref 0.2–1.2)

## 2016-05-09 LAB — CBC WITH DIFFERENTIAL/PLATELET
BASOS ABS: 35 {cells}/uL (ref 0–200)
Basophils Relative: 1 %
EOS ABS: 105 {cells}/uL (ref 15–500)
Eosinophils Relative: 3 %
HCT: 37.3 % (ref 35.0–45.0)
HEMOGLOBIN: 12 g/dL (ref 11.7–15.5)
Lymphocytes Relative: 36 %
Lymphs Abs: 1260 cells/uL (ref 850–3900)
MCH: 29.6 pg (ref 27.0–33.0)
MCHC: 32.2 g/dL (ref 32.0–36.0)
MCV: 91.9 fL (ref 80.0–100.0)
MONOS PCT: 12 %
MPV: 11 fL (ref 7.5–12.5)
Monocytes Absolute: 420 cells/uL (ref 200–950)
NEUTROS ABS: 1680 {cells}/uL (ref 1500–7800)
NEUTROS PCT: 48 %
PLATELETS: 191 10*3/uL (ref 140–400)
RBC: 4.06 MIL/uL (ref 3.80–5.10)
RDW: 12.8 % (ref 11.0–15.0)
WBC: 3.5 10*3/uL — ABNORMAL LOW (ref 3.8–10.8)

## 2016-05-09 LAB — LIPID PANEL
CHOL/HDL RATIO: 3.5 ratio (ref <5.0)
CHOLESTEROL: 208 mg/dL — AB (ref <200)
HDL: 60 mg/dL (ref >50)
LDL Cholesterol: 129 mg/dL — ABNORMAL HIGH (ref <100)
TRIGLYCERIDES: 94 mg/dL (ref <150)
VLDL: 19 mg/dL (ref <30)

## 2016-05-09 LAB — POCT URINALYSIS DIPSTICK
BILIRUBIN UA: NEGATIVE
Glucose, UA: NEGATIVE
KETONES UA: NEGATIVE
Nitrite, UA: NEGATIVE
PH UA: 7 (ref 5.0–8.0)
Protein, UA: NEGATIVE
Spec Grav, UA: 1.01 (ref 1.010–1.025)
Urobilinogen, UA: 2 E.U./dL — AB

## 2016-05-09 LAB — TSH: TSH: 1.24 mIU/L

## 2016-05-09 NOTE — Progress Notes (Signed)
Subjective:    Patient ID: Erika Murphy, female    DOB: [birth year], 66 y.o.   MRN: [id number]  HPI   66 year old  Black Female for health maintenance exam and evaluation of medical issues. Hx multiple sclerosis since 1998. Only taking Lyrica at present.Recently seen at Neurology. Is to have nerve conduction studies in near future.May have right carpal tunnel syndrome. I gave her a wrist splint today. Has some numbness in hands at times. Has had right upper extremity pain and discomfort.  Seen by neurologist April 18. Neurologist notes that she has neuropathic pain involving upper and lower extremities.  Since July 2014, she tells neurologist she has had intermittent left shoulder pain radiating to left lateral arm and left eye and which sometimes wakes her up from sleep. Dr. Veverly Fells did left shoulder arthroplasty in 2011.  History of B-12 deficiency and family member gives her B-12 injection monthly. This is due to gastrectomy which she had due to gastric cancer.  Recently she's had some memory issues. She had an MRI of the brain without contrast in January 2018 and exam was stable compared to 2015.  In 2016 she had right total hip arthroplasty for right femoral neck fracture.  With this visit ,she had fasting lab work. LDL was 129 and total cholesterol 208. She previously had an LDL of 132 when measured 2 years ago. Vitamin D level is normal. White blood cell count 3505 months ago was 4200. Complete metabolic panel normal.  In December 2017 vitamin B-12 level was 935.  Her weight today is stable at 101 pounds.  History of gastric carcinoma status post surgery which included subtotal gastrectomy and partial omentectomy in 2001.  History of osteopenia.  History H. pylori infection 2006.  No known drug allergies.  Has distal spastic gait disorder with more distal weakness than proximal weakness. Used to be followed by Dr. Erling Cruz before he retired. Dr. Erling Cruz diagnosed her multiple sclerosis.  She took Betaseron for a while but did not like the way it made her feel.  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 She is now disabled. Does not smoke or consume alcohol. Resides alone.  Family history: Mother died at age 23 of heart failure. Father died at age 5. He had blindness and was a diabetic. 2 sisters with diabetes. One brother died with some type of GI cancer perhaps liver cancer. Patient is not certain.  She had colonoscopy by Dr. Earlean Shawl in 2003.  Ambulates with a cane due to gait disorder  She is also followed by Dr.Ennever, oncologist with history of cancer. She has received IV iron infusions from him for iron deficiency in the past.  She drags her right leg and ambulates with a cane. She has never driven a car. Always took the bus to work.      Review of Systems  Constitutional: Positive for fatigue.  HENT: Negative.   Respiratory: Negative.   Cardiovascular: Negative.   Endocrine: Negative.   Genitourinary:       Sometimes has urinary tract infection related to neurogenic bladder from MS  Psychiatric/Behavioral:       Anxiety and mild depression       Objective:   Physical Exam  Constitutional: She is oriented to person, place, and time. She appears well-developed and well-nourished. No distress.  HENT:  Head: Normocephalic and atraumatic.  Right Ear: External ear normal.  Left  Ear: External ear normal.  Mouth/Throat: Oropharynx is clear and moist. No oropharyngeal exudate.  Eyes: Conjunctivae and EOM are normal. Pupils are equal, round, and reactive to light. Right eye exhibits no discharge. Left eye exhibits no discharge. No scleral icterus.  Neck: Neck supple. No JVD present. No thyromegaly present.  Cardiovascular: Normal rate, regular rhythm, normal heart sounds and intact distal pulses.   No murmur heard. Pulmonary/Chest: Effort normal and breath sounds normal. No  respiratory distress. She has no wheezes. She has no rales.  Breasts normal female  Abdominal: Soft. Bowel sounds are normal. She exhibits no distension and no mass. There is no tenderness. There is no rebound and no guarding.  Genitourinary:  Genitourinary Comments: Bimanual is normal. Pap not taken due to age.  Musculoskeletal: She exhibits no edema.  Lymphadenopathy:    She has no cervical adenopathy.  Neurological: She is alert and oriented to person, place, and time. No cranial nerve deficit.  Deep tendon reflexes 2+ and symmetrical.  Drags right leg when she walks.  Positive Tinel's and Phalen's signs on the right consistent with carpal tunnel syndrome  Skin: Skin is warm and dry. No rash noted. She is not diaphoretic.  Psychiatric: She has a normal mood and affect. Her behavior is normal. Judgment and thought content normal.  Vitals reviewed.         Assessment & Plan:  Multiple sclerosis-currently just taking Lyrica  Right upper extremity numbness and pain-? Carpal tunnel syndrome. Wrist splint provided today. She is to have nerve conduction study in the near future per neurologist.  History of weight loss-weight is stable at 101  B-12 deficiency-gets monthly B-12 injections from family member at home  History of UTIs-likely due to neurogenic bladder from MS  Mild generalized atrophy on brain scan-this could be cause of memory loss. B-12 level was normal when last checked.  History of gastric carcinoma-followed by oncologist  Essential hypertension-on treatment  History of iron deficiency secondary to gastrectomy  History of anxiety  Plan: Return in 6 months or as needed.  Subjective:   Patient presents for Medicare Annual/Subsequent preventive examination.  Review Past Medical/Family/Social:See above   Risk Factors  Current exercise habits: Sedentary but walks with cane. Unable to exercise. Talked with her about diet with history of weight loss Dietary  issues discussed:   Cardiac risk factors: Hyperlipidemia and family history in mother  Depression Screen  (Note: if answer to either of the following is "Yes", a more complete depression screening is indicated)   Over the past two weeks, have you felt down, depressed or hopeless? No  Over the past two weeks, have you felt little interest or pleasure in doing things? No Have you lost interest or pleasure in daily life? No Do you often feel hopeless? No Do you cry easily over simple problems? No   Activities of Daily Living  In your present state of health, do you have any difficulty performing the following activities?:   Driving? I do not drive Managing money? No  Feeding yourself? No  Getting from bed to chair? No  Climbing a flight of stairs? No  Preparing food and eating?: No  Bathing or showering? No  Getting dressed: No  Getting to the toilet? No  Using the toilet:No  Moving around from place to place: Yes I drag my leg and walk with a cane In the past year have you fallen or had a near fall?:No  Are you sexually active? Did not answer Do  you have more than one partner? Did not answer  Hearing Difficulties: No  Do you often ask people to speak up or repeat themselves? No  Do you experience ringing or noises in your ears? No  Do you have difficulty understanding soft or whispered voices? No  Do you feel that you have a problem with memory? Sometimes Do you often misplace items? No    Home Safety:  Do you have a smoke alarm at your residence? Yes Do you have grab bars in the bathroom? Yes Do you have throw rugs in your house? No   Cognitive Testing  Alert? Yes Normal Appearance?Yes  Oriented to person? Yes Place? Yes  Time? Yes  Recall of three objects? Not checked Can perform simple calculations? Not checked Displays appropriate judgment?Yes  Can read the correct time from a watch face?Yes   List the Names of Other Physician/Practitioners you currently use:    See referral list for the physicians patient is currently seeing.  Neurologist  Dr. Marin Olp   Review of Systems: See above   Objective:     General appearance: Appears Younger than stated age and thin Head: Normocephalic, without obvious abnormality, atraumatic  Eyes: conj clear, EOMi PEERLA  Ears: normal TM's and external ear canals both ears  Nose: Nares normal. Septum midline. Mucosa normal. No drainage or sinus tenderness.  Throat: lips, mucosa, and tongue normal; teeth and gums normal  Neck: no adenopathy, no carotid bruit, no JVD, supple, symmetrical, trachea midline and thyroid not enlarged, symmetric, no tenderness/mass/nodules  No CVA tenderness.  Lungs: clear to auscultation bilaterally  Breasts: normal appearance, no masses or tenderness,  Heart: regular rate and rhythm, S1, S2 normal, no murmur, click, rub or gallop  Abdomen: soft, non-tender; bowel sounds normal; no masses, no organomegaly  Musculoskeletal: ROM normal in all joints, no crepitus, no deformity, Normal muscle strengthen. Back  is symmetric, no curvature. Skin: Skin color, texture, turgor normal. No rashes or lesions  Lymph nodes: Cervical, supraclavicular, and axillary nodes normal.  Neurologic: CN 2 -12 Normal, Normal symmetric reflexes. Gait disorder. Positive to Tinel's and Phalen's signs right. Psych: Alert & Oriented x 3, Mood appear stable.    Assessment:    Annual wellness medicare exam   Plan:    During the course of the visit the patient was educated and counseled about appropriate screening and preventive services including:   Recommend annual flu vaccine which she had in November 2017  She had Prevnar 13 in 2015. Needs to have pneumococcal 23 but has declined.      Patient Instructions (the written plan) was given to the patient.  Medicare Attestation  I have personally reviewed:  The patient's medical and social history  Their use of alcohol, tobacco or illicit drugs  Their  current medications and supplements  The patient's functional ability including ADLs,fall risks, home safety risks, cognitive, and hearing and visual impairment  Diet and physical activities  Evidence for depression or mood disorders  The patient's weight, height, BMI, and visual acuity have been recorded in the chart. I have made referrals, counseling, and provided education to the patient based on review of the above and I have provided the patient with a written personalized care plan for preventive services.

## 2016-05-10 LAB — URINE CULTURE

## 2016-05-10 LAB — VITAMIN D 25 HYDROXY (VIT D DEFICIENCY, FRACTURES): Vit D, 25-Hydroxy: 30 ng/mL (ref 30–100)

## 2016-05-17 NOTE — Telephone Encounter (Signed)
ERROR

## 2016-05-23 ENCOUNTER — Ambulatory Visit (INDEPENDENT_AMBULATORY_CARE_PROVIDER_SITE_OTHER): Payer: Medicare Other | Admitting: Internal Medicine

## 2016-05-23 ENCOUNTER — Telehealth: Payer: Self-pay | Admitting: Internal Medicine

## 2016-05-23 ENCOUNTER — Encounter: Payer: Self-pay | Admitting: Internal Medicine

## 2016-05-23 VITALS — BP 130/74 | HR 63 | Temp 98.4°F | Ht 65.0 in | Wt 101.0 lb

## 2016-05-23 DIAGNOSIS — I10 Essential (primary) hypertension: Secondary | ICD-10-CM | POA: Diagnosis not present

## 2016-05-23 DIAGNOSIS — H8110 Benign paroxysmal vertigo, unspecified ear: Secondary | ICD-10-CM

## 2016-05-23 DIAGNOSIS — F439 Reaction to severe stress, unspecified: Secondary | ICD-10-CM | POA: Diagnosis not present

## 2016-05-23 DIAGNOSIS — G35 Multiple sclerosis: Secondary | ICD-10-CM | POA: Diagnosis not present

## 2016-05-23 DIAGNOSIS — G35D Multiple sclerosis, unspecified: Secondary | ICD-10-CM

## 2016-05-23 MED ORDER — MECLIZINE HCL 25 MG PO TABS: 25.0000 mg | ORAL_TABLET | Freq: Three times a day (TID) | ORAL | 0 refills | Status: DC | PRN

## 2016-05-23 NOTE — Telephone Encounter (Signed)
Pt called in and stated that she woke up this morning and was slightly dizzy.  She laid back down and when she got back up she felt extremely dizzy like she was "on a roller coaster" and felt like she was going to "use the bathroom" on herself.  Also felt like she was   She now has a head that started near her cheek bones but now it is at the front.  Patient advised that she was curious if it could be from her blood pressure.  Dr. Renold Genta asked if patient could come today.

## 2016-05-24 NOTE — Progress Notes (Signed)
   Subjective:    Patient ID: Erika Murphy, female    DOB: Jan 02, 1951, 66 y.o.   MRN: [id number]  HPI 66 year old 16 female with hypertension and multiple sclerosis called complaining of acute vertigo. One episode was particular severe and she thought she might pass out. Said everything "went black. " However she did not pass out.  Her grandchild's godmother was recently murdered so there is some stress going on. Patient has never had vertigo in the past. No recent respiratory infection and no airplane travel.  She is not on treatment for multiple sclerosis having declined that. She is followed by Neurology.    Review of Systems describes room spinning dizziness with nausea     Objective:   Physical Exam PERRLA. No nystagmus is detected. Extraocular movements are full. Neck is supple. Chest clear. Cardiac exam regular rate and rhythm. Moves all 4 extremities. TMs are clear. Pharynx is clear. Neck is without thyromegaly.       Assessment & Plan:  Benign positional vertigo  Essential hypertension  Multiple sclerosis  Situational stress with recent of the aunts described above  Plan: Meclizine 25 mg up to 3 times daily as needed for vertigo. Patient reassured. If symptoms persist, consider repeat imaging of brain.  25 minutes spent with patient

## 2016-05-24 NOTE — Patient Instructions (Signed)
Trial of meclizine 25 mg up to 3 times daily as needed for vertigo. Do not change positions quickly. If symptoms persist, call office and we will pursue further testing.

## 2016-05-26 ENCOUNTER — Encounter (INDEPENDENT_AMBULATORY_CARE_PROVIDER_SITE_OTHER): Payer: Self-pay

## 2016-05-26 ENCOUNTER — Ambulatory Visit (INDEPENDENT_AMBULATORY_CARE_PROVIDER_SITE_OTHER): Payer: Medicare Other | Admitting: Neurology

## 2016-05-26 DIAGNOSIS — R269 Unspecified abnormalities of gait and mobility: Secondary | ICD-10-CM

## 2016-05-26 DIAGNOSIS — G5601 Carpal tunnel syndrome, right upper limb: Secondary | ICD-10-CM

## 2016-05-26 DIAGNOSIS — R202 Paresthesia of skin: Secondary | ICD-10-CM | POA: Diagnosis not present

## 2016-05-26 DIAGNOSIS — G35 Multiple sclerosis: Secondary | ICD-10-CM

## 2016-05-26 DIAGNOSIS — Z0289 Encounter for other administrative examinations: Secondary | ICD-10-CM

## 2016-05-26 NOTE — Procedures (Addendum)
Full Name: Erika Murphy Gender: Female MRN #: [id number] Date of Birth: 1950-03-06    Visit Date: 05/26/2016 11:49 Age: 66 Years 3 Months Old Examining Physician: Marcial Pacas, MD  Referring Physician: Krista Blue, MD History: 66 year old right-handed female presents with right hand paresthesia, she has MS, gait abnormality, tends to use cane with her right hand  Summary of the test: Nerve conduction study: Right upper and lower extremity sensory and motor nerve conduction studies were within normal limit, Right median mixed response was 0.41 millisecond prolonged in comparison to right ulnar mixed response.  Electromyography: Selected needle examination of right upper extremity muscles and right cervical paraspinals were normal.   Conclusion:  This is a slight abnormal study, there is electrodiagnostic evidence of slight slowing across the right wrist, suggestive of a mild carpal tunnel syndrome. There is no evidence of right cervical radiculopathy.   ------------------------------- Marcial Pacas,  M.D.  [facility] [place], [place] [address] Tel: [phone] Fax: [phone]        [facility]    Nerve / Sites Rec. Site Latency Ref. Amplitude Ref. Rel Amp Segments Distance Velocity Ref. Area    ms ms mV mV %  cm m/s m/s mVms  R Median - APB     Wrist APB 3.0 ?4.4 4.4 ?4.0 100 Wrist - APB 7   18.6     Upper arm APB 7.1  3.6  82.6 Upper arm - Wrist 22 53 ?49 17.2  R Ulnar - ADM     Wrist ADM 2.7 ?3.3 8.5 ?6.0 100 Wrist - ADM 7   25.6     B.Elbow ADM 6.0  8.0  93.8 B.Elbow - Wrist 19 58 ?49 24.5     A.Elbow ADM 7.8  7.6  95.4 A.Elbow - B.Elbow 10 55 ?49 24.0         A.Elbow - Wrist      R Peroneal - EDB     Ankle EDB 4.8 ?6.5 7.4 ?2.0 100 Ankle - EDB 9   17.0     Fib head EDB 11.2  6.2  83.6 Fib head - Ankle 30 47 ?44 16.1     Pop fossa EDB 13.9  5.4  86.5 Pop fossa - Fib head 12 44 ?44 14.5         Pop fossa - Ankle      R Tibial - AH     Ankle AH  4.8 ?5.8 15.6 ?4.0 100 Ankle - AH 9   35.5     Pop fossa AH 14.2  10.3  65.9 Pop fossa - Ankle 38 41 ?41 31.1             SNC    Nerve / Sites Rec. Site Peak Lat Ref.  Amp Ref. Segments Distance    ms ms V V  cm  R Sural - Ankle (Calf)     Calf Ankle 3.80 ?4.40 11 ?6 Calf - Ankle 14  R Superficial peroneal - Ankle     Lat leg Ankle 4.01 ?4.40 7 ?6 Lat leg - Ankle 14  R Median - Orthodromic (Dig II, Mid palm)     Dig II Wrist 2.86 ?3.40 10 ?10 Dig II - Wrist 13  R Ulnar - Orthodromic, (Dig V, Mid palm)     Dig V Wrist 2.45 ?3.10 13 ?5 Dig V - Wrist 11             F  Wave    Nerve F Lat Ref.   ms ms  R Tibial - AH 53.8 ?56.0  R Ulnar - ADM 27.8 ?32.0         EMG full       EMG Summary Table    Spontaneous MUAP Recruitment  Muscle IA Fib PSW Fasc Other Amp Dur. Poly Pattern  R. Abductor pollicis brevis Normal None None None _______ Normal Normal Normal Normal  R. Pronator teres Normal None None None _______ Normal Normal Normal Normal  R. Biceps brachii Normal None None None _______ Normal Normal Normal Normal  R. Deltoid Normal None None None _______ Normal Normal Normal Normal  R. Cervical paraspinals Normal None None None _______ Normal Normal Normal Normal

## 2016-05-29 NOTE — Patient Instructions (Addendum)
It was a pleasure to see you today. Continue to monitor your weight at home. Continue same medications and return in 6 months. Continue monthly B-12 injections.

## 2016-06-01 ENCOUNTER — Telehealth: Payer: Self-pay

## 2016-06-01 ENCOUNTER — Telehealth: Payer: Self-pay | Admitting: Neurology

## 2016-06-01 DIAGNOSIS — E538 Deficiency of other specified B group vitamins: Secondary | ICD-10-CM | POA: Diagnosis not present

## 2016-06-01 DIAGNOSIS — G35 Multiple sclerosis: Secondary | ICD-10-CM | POA: Diagnosis not present

## 2016-06-01 DIAGNOSIS — M8580 Other specified disorders of bone density and structure, unspecified site: Secondary | ICD-10-CM | POA: Diagnosis not present

## 2016-06-01 DIAGNOSIS — I10 Essential (primary) hypertension: Secondary | ICD-10-CM | POA: Diagnosis not present

## 2016-06-01 DIAGNOSIS — H8110 Benign paroxysmal vertigo, unspecified ear: Secondary | ICD-10-CM | POA: Diagnosis not present

## 2016-06-01 DIAGNOSIS — G5601 Carpal tunnel syndrome, right upper limb: Secondary | ICD-10-CM | POA: Diagnosis not present

## 2016-06-01 DIAGNOSIS — Z9181 History of falling: Secondary | ICD-10-CM | POA: Diagnosis not present

## 2016-06-01 DIAGNOSIS — D509 Iron deficiency anemia, unspecified: Secondary | ICD-10-CM | POA: Diagnosis not present

## 2016-06-01 DIAGNOSIS — N319 Neuromuscular dysfunction of bladder, unspecified: Secondary | ICD-10-CM | POA: Diagnosis not present

## 2016-06-01 DIAGNOSIS — D002 Carcinoma in situ of stomach: Secondary | ICD-10-CM | POA: Diagnosis not present

## 2016-06-01 NOTE — Telephone Encounter (Signed)
Mamie Nick is aware

## 2016-06-01 NOTE — Telephone Encounter (Signed)
Returned call to [employer] at [facility] - he will initiate therapy and send over orders for signature.

## 2016-06-01 NOTE — Telephone Encounter (Signed)
Okay to approve this. 

## 2016-06-01 NOTE — Telephone Encounter (Signed)
Pramod/[facility] [phone] needs VO for PT 2 x 4. Also needs OT and social worker eval.

## 2016-06-01 NOTE — Telephone Encounter (Signed)
Erika Murphy from [facility], called and asked for a verbal OK to start the PT and OT with some socail work 2x a week for 4 weeks. Was ordered by [employer] but they stated they wanted to try and get the OK from you before calling them. The call back number is  [phone]

## 2016-06-08 ENCOUNTER — Telehealth: Payer: Self-pay | Admitting: Neurology

## 2016-06-08 NOTE — Telephone Encounter (Signed)
Erika Murphy from [facility] called to inform that she was unable to see pt on either of the 2 visits for this week because pt is out of town

## 2016-06-08 NOTE — Telephone Encounter (Signed)
Noted in chart.

## 2016-06-12 ENCOUNTER — Telehealth: Payer: Self-pay | Admitting: Internal Medicine

## 2016-06-12 ENCOUNTER — Other Ambulatory Visit (HOSPITAL_BASED_OUTPATIENT_CLINIC_OR_DEPARTMENT_OTHER): Payer: Medicare Other

## 2016-06-12 ENCOUNTER — Encounter: Payer: Self-pay | Admitting: Hematology & Oncology

## 2016-06-12 ENCOUNTER — Ambulatory Visit (HOSPITAL_BASED_OUTPATIENT_CLINIC_OR_DEPARTMENT_OTHER): Payer: Medicare Other | Admitting: Hematology & Oncology

## 2016-06-12 ENCOUNTER — Ambulatory Visit: Payer: Medicare Other | Admitting: Hematology & Oncology

## 2016-06-12 VITALS — BP 130/55 | HR 67 | Temp 97.9°F | Resp 14 | Wt 100.0 lb

## 2016-06-12 DIAGNOSIS — G35 Multiple sclerosis: Secondary | ICD-10-CM

## 2016-06-12 DIAGNOSIS — E538 Deficiency of other specified B group vitamins: Secondary | ICD-10-CM

## 2016-06-12 DIAGNOSIS — K909 Intestinal malabsorption, unspecified: Secondary | ICD-10-CM

## 2016-06-12 DIAGNOSIS — D510 Vitamin B12 deficiency anemia due to intrinsic factor deficiency: Secondary | ICD-10-CM

## 2016-06-12 DIAGNOSIS — D500 Iron deficiency anemia secondary to blood loss (chronic): Secondary | ICD-10-CM | POA: Diagnosis not present

## 2016-06-12 DIAGNOSIS — D509 Iron deficiency anemia, unspecified: Secondary | ICD-10-CM

## 2016-06-12 DIAGNOSIS — Z85028 Personal history of other malignant neoplasm of stomach: Secondary | ICD-10-CM | POA: Diagnosis not present

## 2016-06-12 LAB — IRON AND TIBC
%SAT: 25 % (ref 21–57)
Iron: 81 ug/dL (ref 41–142)
TIBC: 319 ug/dL (ref 236–444)
UIBC: 238 ug/dL (ref 120–384)

## 2016-06-12 LAB — CBC WITH DIFFERENTIAL (CANCER CENTER ONLY)
BASO#: 0 10*3/uL (ref 0.0–0.2)
BASO%: 0.3 % (ref 0.0–2.0)
EOS ABS: 0.1 10*3/uL (ref 0.0–0.5)
EOS%: 2.3 % (ref 0.0–7.0)
HCT: 38.1 % (ref 34.8–46.6)
HGB: 12.4 g/dL (ref 11.6–15.9)
LYMPH#: 1.1 10*3/uL (ref 0.9–3.3)
LYMPH%: 35.1 % (ref 14.0–48.0)
MCH: 30 pg (ref 26.0–34.0)
MCHC: 32.5 g/dL (ref 32.0–36.0)
MCV: 92 fL (ref 81–101)
MONO#: 0.3 10*3/uL (ref 0.1–0.9)
MONO%: 11.1 % (ref 0.0–13.0)
NEUT%: 51.2 % (ref 39.6–80.0)
NEUTROS ABS: 1.6 10*3/uL (ref 1.5–6.5)
PLATELETS: 147 10*3/uL (ref 145–400)
RBC: 4.13 10*6/uL (ref 3.70–5.32)
RDW: 12.1 % (ref 11.1–15.7)
WBC: 3.1 10*3/uL — ABNORMAL LOW (ref 3.9–10.0)

## 2016-06-12 LAB — CMP (CANCER CENTER ONLY)
ALT(SGPT): 26 U/L (ref 10–47)
AST: 34 U/L (ref 11–38)
Albumin: 3.8 g/dL (ref 3.3–5.5)
Alkaline Phosphatase: 97 U/L — ABNORMAL HIGH (ref 26–84)
BILIRUBIN TOTAL: 1 mg/dL (ref 0.20–1.60)
BUN: 11 mg/dL (ref 7–22)
CHLORIDE: 107 meq/L (ref 98–108)
CO2: 32 meq/L (ref 18–33)
CREATININE: 0.9 mg/dL (ref 0.6–1.2)
Calcium: 9.6 mg/dL (ref 8.0–10.3)
GLUCOSE: 88 mg/dL (ref 73–118)
Potassium: 4.7 mEq/L (ref 3.3–4.7)
SODIUM: 144 meq/L (ref 128–145)
Total Protein: 7.5 g/dL (ref 6.4–8.1)

## 2016-06-12 LAB — FERRITIN: Ferritin: 124 ng/ml (ref 9–269)

## 2016-06-12 LAB — CHCC SATELLITE - SMEAR

## 2016-06-12 NOTE — Telephone Encounter (Signed)
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

## 2016-06-12 NOTE — Telephone Encounter (Signed)
This is OK

## 2016-06-12 NOTE — Progress Notes (Signed)
Hematology and Oncology Follow Up Visit  Erika Murphy [id number] 28-Nov-1950 66 y.o. 06/12/2016   Principle Diagnosis:   Stage II (T3N0M0) adenocarcinoma of the stomach-remission Recurrent iron deficiency anemia  Pernicious anemia Multiple sclerosis Current Therapy:    IV iron as indicated  Vitamin B-12 1 mg IM every month-done at home     Interim History:  [name] is back for follow-up. We last saw her back in December. She is doing well.   The big news is that she is going on a family cruise in July. This is to the [place]. About 60 people are going. She is really looking forward to this.  Thankfully, her multiple sclerosis has not flared up. She currently is on no medication for this.  Her iron studies back in December showed a ferritin of 127 with an iron saturation of 35%.  She's had no bleeding. There's been no change in bowel or bladder habits.  She had no problems with infections over the winter or spring time.  She is doing well with vitamin B-12 injections at home. Her last vitamin B-12 level in December was 935.  Overall, her performance status is ECOG 1.    Medications:  Current Outpatient Prescriptions:  .  albuterol (PROVENTIL HFA;VENTOLIN HFA) 108 (90 Base) MCG/ACT inhaler, Inhale 2 puffs into the lungs every 6 (six) hours as needed for wheezing or shortness of breath., Disp: 1 Inhaler, Rfl: 12 .  amLODipine (NORVASC) 2.5 MG tablet, Take 1 tablet (2.5 mg total) by mouth daily., Disp: 30 tablet, Rfl: 1 .  Cholecalciferol (VITAMIN D3) 2000 units TABS, Take by mouth daily., Disp: , Rfl:  .  cyanocobalamin (,VITAMIN B-12,) 1000 MCG/ML injection, INJECT 1 ML (1,000 MCG TOTAL) INTO THE MUSCLE ONCE A MONTH AS DIRECTED, Disp: 10 mL, Rfl: prn .  diazepam (VALIUM) 5 MG tablet, TAKE 1 TABLET BY MOUTH TWICE A DAY AS NEEDED (Patient not taking: Reported on 05/23/2016), Disp: 180 tablet, Rfl: 0 .  meclizine (ANTIVERT) 25 MG tablet, Take 1 tablet (25 mg total) by mouth 3  (three) times daily as needed for dizziness., Disp: 30 tablet, Rfl: 0 .  pantoprazole (PROTONIX) 40 MG tablet, TAKE 1 TABLET (40 MG TOTAL) BY MOUTH DAILY., Disp: 90 tablet, Rfl: 3 .  pregabalin (LYRICA) 50 MG capsule, Take 1 capsule (50 mg total) by mouth 3 (three) times daily., Disp: 90 capsule, Rfl: 1  Allergies: No Known Allergies  Past Medical History, Surgical history, Social history, and Family History were reviewed and updated.  Review of Systems: As above  Physical Exam:  weight is 100 lb (45.4 kg). Her oral temperature is 97.9 F (36.6 C). Her blood pressure is 130/55 (abnormal) and her pulse is 67. Her respiration is 14.   Thin African female. Lungs are clear. Cardiac exam regular rate and rhythm with no murmurs, rubs or bruits.. Abdomen is soft. She has good bowel sounds. There is no fluid wave. Has a well-healed laparotomy scar. There is no palpable liver or spleen tip. Back exam shows no tenderness over the spine ribs or hips. Extremities shows  symmetric muscle atrophy bilaterally. Skin exam no rashes, ecchymoses or petechia. Neurological exam is nonfocal.  Lab Results  Component Value Date   WBC 3.1 (L) 06/12/2016   HGB 12.4 06/12/2016   HCT 38.1 06/12/2016   MCV 92 06/12/2016   PLT 147 06/12/2016     Chemistry      Component Value Date/Time   NA 144 06/12/2016 1151  NA 142 06/11/2015 1156   K 4.7 06/12/2016 1151   K 4.3 06/11/2015 1156   CL 107 06/12/2016 1151   CO2 32 06/12/2016 1151   CO2 29 06/11/2015 1156   BUN 11 06/12/2016 1151   BUN 13.5 06/11/2015 1156   CREATININE 0.9 06/12/2016 1151   CREATININE 1.2 (H) 06/11/2015 1156      Component Value Date/Time   CALCIUM 9.6 06/12/2016 1151   CALCIUM 9.7 06/11/2015 1156   ALKPHOS 97 (H) 06/12/2016 1151   ALKPHOS 111 06/11/2015 1156   AST 34 06/12/2016 1151   AST 30 06/11/2015 1156   ALT 26 06/12/2016 1151   ALT 23 06/11/2015 1156   BILITOT 1.00 06/12/2016 1151   BILITOT 1.03 06/11/2015 1156          Impression and Plan: Erika Murphy is a 66 year old African [place] female. She had a history of stage II stomach cancer. She underwent resection. She had adjuvant therapy. This is now 19 years out from treatment. She is cured of the stomach cancer.  Her white cell count is down a little bit. I looked at her blood on the microscope. I really do not see anything that looked suspicious. This might be from her medications. It might also be ethnic associated leukopenia (ELA). Again we will have to watch this closely.   I will plan to see her back in 6 months. I told her that I need to see pictures of when she goes on her cruise.   [name], MD 6/11/201812:53 PM

## 2016-06-12 NOTE — Telephone Encounter (Signed)
Called and LVM with the verbal OK

## 2016-06-13 ENCOUNTER — Telehealth: Payer: Self-pay | Admitting: *Deleted

## 2016-06-13 DIAGNOSIS — I10 Essential (primary) hypertension: Secondary | ICD-10-CM | POA: Diagnosis not present

## 2016-06-13 DIAGNOSIS — G5601 Carpal tunnel syndrome, right upper limb: Secondary | ICD-10-CM | POA: Diagnosis not present

## 2016-06-13 DIAGNOSIS — D002 Carcinoma in situ of stomach: Secondary | ICD-10-CM | POA: Diagnosis not present

## 2016-06-13 DIAGNOSIS — N319 Neuromuscular dysfunction of bladder, unspecified: Secondary | ICD-10-CM | POA: Diagnosis not present

## 2016-06-13 DIAGNOSIS — Z9181 History of falling: Secondary | ICD-10-CM | POA: Diagnosis not present

## 2016-06-13 DIAGNOSIS — E538 Deficiency of other specified B group vitamins: Secondary | ICD-10-CM | POA: Diagnosis not present

## 2016-06-13 DIAGNOSIS — D509 Iron deficiency anemia, unspecified: Secondary | ICD-10-CM | POA: Diagnosis not present

## 2016-06-13 DIAGNOSIS — G35 Multiple sclerosis: Secondary | ICD-10-CM | POA: Diagnosis not present

## 2016-06-13 DIAGNOSIS — M8580 Other specified disorders of bone density and structure, unspecified site: Secondary | ICD-10-CM | POA: Diagnosis not present

## 2016-06-13 DIAGNOSIS — H8110 Benign paroxysmal vertigo, unspecified ear: Secondary | ICD-10-CM | POA: Diagnosis not present

## 2016-06-13 LAB — VITAMIN B12: VITAMIN B 12: 680 pg/mL (ref 232–1245)

## 2016-06-13 NOTE — Telephone Encounter (Addendum)
Patient is aware of results  ----- Message from [name], MD sent at 06/12/2016  2:53 PM EDT ----- Call - Iron level is ok!!!  pete

## 2016-06-19 ENCOUNTER — Other Ambulatory Visit: Payer: Self-pay

## 2016-06-19 MED ORDER — AMLODIPINE BESYLATE 2.5 MG PO TABS: 2.5000 mg | ORAL_TABLET | Freq: Every day | ORAL | 3 refills | Status: DC

## 2016-06-20 ENCOUNTER — Telehealth: Payer: Self-pay | Admitting: Internal Medicine

## 2016-06-20 ENCOUNTER — Telehealth: Payer: Self-pay

## 2016-06-20 NOTE — Telephone Encounter (Signed)
Pt declined the nausea medication and stated that she would do the diet change and if it continued would go to the ED

## 2016-06-20 NOTE — Telephone Encounter (Signed)
She may have gastroenteritis. Suggest she put herself on clear liquids and ask if she needs phenergan or zofran for nausea. She may need to go to ED for fluids if symptoms persist.

## 2016-06-20 NOTE — Telephone Encounter (Signed)
Elmyra Ricks from [facility] [phone]) is calling to leave message regarding patient.  Had to cancel PT for last Friday, 6/15 per patient's request.  Patient had a funeral to go to.

## 2016-06-20 NOTE — Telephone Encounter (Signed)
Pt states that she was fine all day yesterday and then she felt like her acid reflux was flaring so she took protonix and then states she lost control of her bowels and bladder. Since then has been having vomitting and diarrhea. Has been visiting friend in the hospital but other than that has not really been anywhere. Would like to be advised on whether she should come in to the office or not.

## 2016-06-23 ENCOUNTER — Telehealth: Payer: Self-pay

## 2016-06-23 NOTE — Telephone Encounter (Signed)
Spoke with patient and she advised that Dr. Rhea Belton office set this agency up for her (her Neurologist).  Advised that I would be calling them to provide feedback.  And, they would not be coming back.  Also asked patient if she would be ok if I set up [facility] to come out and provide PT for her.  Patient is very happy with that.    Spoke with Laurey Morale, Director of Operations at [facility] [address] [place], [place]  [phone] Provided all the complaints and feedback from the patient to Janett Billow and she was appreciative of the feedback.  States that she will share this information with her team and make sure that they implement changes in the future in this regard so that future patient's won't go through this same feeling.  It was not their intent to make the patient feel that they did not have a routine or schedule.  She very quite apologetic.  I told her that this patient was not up to continuing therapy with their organization at this time.  She advised she would close the episode and apologized again.    I filled out a referral form for [facility] and faxed documentation and clinical notes over to [facility] to [phone].  Will await further contact from them to set up a contact for the patient.

## 2016-06-23 NOTE — Telephone Encounter (Signed)
I do not know how this agency was selected- orders just showed up. Does Ondria know how this agency was selected? I would call and ask for agency director. Tell them what Merrianne said and tell them we are discontinuing their services immediately.  Then see if [facility] will go out. I know you will handle this well.

## 2016-06-23 NOTE — Telephone Encounter (Signed)
Spoke with patient and she advised that she is slowly feeling better from the virus.  She still sounds pretty weak.  She states that she is very frustrated with the [facility].  States that 2 different people have come out and evaluated her.  One spent about 45 minutes with her and that was great.  However, all other visits have been very short.  States that it takes her quite some time to get ready and put her clothes on and be ready for them.  And, then they show up and may spend 15-20 minutes with her, if that.  States that they seem to have no routine, no schedule.  She doesn't know who is coming.  One lady was supposed to show up at 4:00 p.m. - she called and arrived about 4:25 and talked the entire time about her son's baseball game.  She stayed about 15 minutes and then she left.  Patient states that she didn't really appreciate going through all of the work of getting ready just to have the woman leave and go to the ball park.  She feels that she needs therapy and she feels like she's paying for them to have a job and she said she just didn't want to continue at this particular time until she feels better and stronger and until she felt like they were ready to come and stay for at least 30-45 minutes.  She hadn't heard of this agency before.    Do we need to switch agency's for her?

## 2016-06-23 NOTE — Telephone Encounter (Signed)
Please call pt and see what is going on. We thought she may have had virus earlier in the week.

## 2016-06-23 NOTE — Telephone Encounter (Signed)
Erika Murphy, from [facility], was unable to do physical therapy with pt yesterday and today as well being as she is still not feeling well. They are required to inform the provider when frequency changes.

## 2016-06-23 NOTE — Telephone Encounter (Signed)
Please see info about PT for Erika Murphy and Erika Murphy

## 2016-06-27 DIAGNOSIS — I10 Essential (primary) hypertension: Secondary | ICD-10-CM | POA: Diagnosis not present

## 2016-06-27 DIAGNOSIS — J45909 Unspecified asthma, uncomplicated: Secondary | ICD-10-CM | POA: Diagnosis not present

## 2016-06-27 DIAGNOSIS — E538 Deficiency of other specified B group vitamins: Secondary | ICD-10-CM | POA: Diagnosis not present

## 2016-06-27 DIAGNOSIS — D509 Iron deficiency anemia, unspecified: Secondary | ICD-10-CM | POA: Diagnosis not present

## 2016-06-27 DIAGNOSIS — M19011 Primary osteoarthritis, right shoulder: Secondary | ICD-10-CM | POA: Diagnosis not present

## 2016-06-27 DIAGNOSIS — M8580 Other specified disorders of bone density and structure, unspecified site: Secondary | ICD-10-CM | POA: Diagnosis not present

## 2016-06-27 DIAGNOSIS — K589 Irritable bowel syndrome without diarrhea: Secondary | ICD-10-CM | POA: Diagnosis not present

## 2016-06-27 DIAGNOSIS — G5601 Carpal tunnel syndrome, right upper limb: Secondary | ICD-10-CM | POA: Diagnosis not present

## 2016-06-27 DIAGNOSIS — M810 Age-related osteoporosis without current pathological fracture: Secondary | ICD-10-CM | POA: Diagnosis not present

## 2016-06-27 DIAGNOSIS — K909 Intestinal malabsorption, unspecified: Secondary | ICD-10-CM | POA: Diagnosis not present

## 2016-06-27 DIAGNOSIS — N319 Neuromuscular dysfunction of bladder, unspecified: Secondary | ICD-10-CM | POA: Diagnosis not present

## 2016-06-27 DIAGNOSIS — G35 Multiple sclerosis: Secondary | ICD-10-CM | POA: Diagnosis not present

## 2016-06-28 DIAGNOSIS — G5601 Carpal tunnel syndrome, right upper limb: Secondary | ICD-10-CM | POA: Diagnosis not present

## 2016-06-28 DIAGNOSIS — K589 Irritable bowel syndrome without diarrhea: Secondary | ICD-10-CM | POA: Diagnosis not present

## 2016-06-28 DIAGNOSIS — M19011 Primary osteoarthritis, right shoulder: Secondary | ICD-10-CM | POA: Diagnosis not present

## 2016-06-28 DIAGNOSIS — I10 Essential (primary) hypertension: Secondary | ICD-10-CM | POA: Diagnosis not present

## 2016-06-28 DIAGNOSIS — G35 Multiple sclerosis: Secondary | ICD-10-CM | POA: Diagnosis not present

## 2016-06-28 DIAGNOSIS — M810 Age-related osteoporosis without current pathological fracture: Secondary | ICD-10-CM | POA: Diagnosis not present

## 2016-06-28 DIAGNOSIS — N319 Neuromuscular dysfunction of bladder, unspecified: Secondary | ICD-10-CM | POA: Diagnosis not present

## 2016-06-28 DIAGNOSIS — M8580 Other specified disorders of bone density and structure, unspecified site: Secondary | ICD-10-CM | POA: Diagnosis not present

## 2016-06-28 DIAGNOSIS — E538 Deficiency of other specified B group vitamins: Secondary | ICD-10-CM | POA: Diagnosis not present

## 2016-06-28 DIAGNOSIS — D509 Iron deficiency anemia, unspecified: Secondary | ICD-10-CM | POA: Diagnosis not present

## 2016-06-28 DIAGNOSIS — J45909 Unspecified asthma, uncomplicated: Secondary | ICD-10-CM | POA: Diagnosis not present

## 2016-06-28 DIAGNOSIS — K909 Intestinal malabsorption, unspecified: Secondary | ICD-10-CM | POA: Diagnosis not present

## 2016-06-30 ENCOUNTER — Telehealth: Payer: Self-pay | Admitting: Internal Medicine

## 2016-06-30 ENCOUNTER — Telehealth: Payer: Self-pay

## 2016-06-30 DIAGNOSIS — G35 Multiple sclerosis: Secondary | ICD-10-CM | POA: Diagnosis not present

## 2016-06-30 DIAGNOSIS — E538 Deficiency of other specified B group vitamins: Secondary | ICD-10-CM | POA: Diagnosis not present

## 2016-06-30 DIAGNOSIS — D509 Iron deficiency anemia, unspecified: Secondary | ICD-10-CM | POA: Diagnosis not present

## 2016-06-30 DIAGNOSIS — J45909 Unspecified asthma, uncomplicated: Secondary | ICD-10-CM | POA: Diagnosis not present

## 2016-06-30 DIAGNOSIS — M810 Age-related osteoporosis without current pathological fracture: Secondary | ICD-10-CM | POA: Diagnosis not present

## 2016-06-30 DIAGNOSIS — K589 Irritable bowel syndrome without diarrhea: Secondary | ICD-10-CM | POA: Diagnosis not present

## 2016-06-30 DIAGNOSIS — K909 Intestinal malabsorption, unspecified: Secondary | ICD-10-CM | POA: Diagnosis not present

## 2016-06-30 DIAGNOSIS — N319 Neuromuscular dysfunction of bladder, unspecified: Secondary | ICD-10-CM | POA: Diagnosis not present

## 2016-06-30 DIAGNOSIS — M8580 Other specified disorders of bone density and structure, unspecified site: Secondary | ICD-10-CM | POA: Diagnosis not present

## 2016-06-30 DIAGNOSIS — M19011 Primary osteoarthritis, right shoulder: Secondary | ICD-10-CM | POA: Diagnosis not present

## 2016-06-30 DIAGNOSIS — G5601 Carpal tunnel syndrome, right upper limb: Secondary | ICD-10-CM | POA: Diagnosis not present

## 2016-06-30 DIAGNOSIS — I10 Essential (primary) hypertension: Secondary | ICD-10-CM | POA: Diagnosis not present

## 2016-06-30 NOTE — Telephone Encounter (Signed)
Pt would like to know if she can get a RX for the shingles vaccine. Please advise

## 2016-06-30 NOTE — Telephone Encounter (Signed)
Prescription written for 3-in-1 commode for patient.  Faxed prescription to [facility] [place] [phone]  Mailed original prescription to patient's home address.  Scanned copy of prescription into patient's chart.

## 2016-06-30 NOTE — Telephone Encounter (Signed)
RX written 

## 2016-06-30 NOTE — Telephone Encounter (Signed)
Rx written Please mail to her 

## 2016-06-30 NOTE — Telephone Encounter (Signed)
[facility] calling [phone] requesting a prescription for a 3 in 1 commode for her.  We can fax the prescription to their office and they will take care of it from there.      Fax [phone]  Thank you.

## 2016-06-30 NOTE — Telephone Encounter (Signed)
Mailed RX

## 2016-07-03 DIAGNOSIS — K909 Intestinal malabsorption, unspecified: Secondary | ICD-10-CM | POA: Diagnosis not present

## 2016-07-03 DIAGNOSIS — I10 Essential (primary) hypertension: Secondary | ICD-10-CM | POA: Diagnosis not present

## 2016-07-03 DIAGNOSIS — M8580 Other specified disorders of bone density and structure, unspecified site: Secondary | ICD-10-CM | POA: Diagnosis not present

## 2016-07-03 DIAGNOSIS — K589 Irritable bowel syndrome without diarrhea: Secondary | ICD-10-CM | POA: Diagnosis not present

## 2016-07-03 DIAGNOSIS — M19011 Primary osteoarthritis, right shoulder: Secondary | ICD-10-CM | POA: Diagnosis not present

## 2016-07-03 DIAGNOSIS — M810 Age-related osteoporosis without current pathological fracture: Secondary | ICD-10-CM | POA: Diagnosis not present

## 2016-07-03 DIAGNOSIS — G35 Multiple sclerosis: Secondary | ICD-10-CM | POA: Diagnosis not present

## 2016-07-03 DIAGNOSIS — E538 Deficiency of other specified B group vitamins: Secondary | ICD-10-CM | POA: Diagnosis not present

## 2016-07-03 DIAGNOSIS — D509 Iron deficiency anemia, unspecified: Secondary | ICD-10-CM | POA: Diagnosis not present

## 2016-07-03 DIAGNOSIS — J45909 Unspecified asthma, uncomplicated: Secondary | ICD-10-CM | POA: Diagnosis not present

## 2016-07-03 DIAGNOSIS — N319 Neuromuscular dysfunction of bladder, unspecified: Secondary | ICD-10-CM | POA: Diagnosis not present

## 2016-07-03 DIAGNOSIS — G5601 Carpal tunnel syndrome, right upper limb: Secondary | ICD-10-CM | POA: Diagnosis not present

## 2016-07-04 DIAGNOSIS — G35 Multiple sclerosis: Secondary | ICD-10-CM | POA: Diagnosis not present

## 2016-07-04 DIAGNOSIS — S72001A Fracture of unspecified part of neck of right femur, initial encounter for closed fracture: Secondary | ICD-10-CM | POA: Diagnosis not present

## 2016-07-04 DIAGNOSIS — R269 Unspecified abnormalities of gait and mobility: Secondary | ICD-10-CM | POA: Diagnosis not present

## 2016-07-07 ENCOUNTER — Telehealth: Payer: Self-pay

## 2016-07-07 DIAGNOSIS — N319 Neuromuscular dysfunction of bladder, unspecified: Secondary | ICD-10-CM | POA: Diagnosis not present

## 2016-07-07 DIAGNOSIS — I10 Essential (primary) hypertension: Secondary | ICD-10-CM | POA: Diagnosis not present

## 2016-07-07 DIAGNOSIS — K909 Intestinal malabsorption, unspecified: Secondary | ICD-10-CM | POA: Diagnosis not present

## 2016-07-07 DIAGNOSIS — D509 Iron deficiency anemia, unspecified: Secondary | ICD-10-CM | POA: Diagnosis not present

## 2016-07-07 DIAGNOSIS — Z1239 Encounter for other screening for malignant neoplasm of breast: Secondary | ICD-10-CM

## 2016-07-07 DIAGNOSIS — E538 Deficiency of other specified B group vitamins: Secondary | ICD-10-CM | POA: Diagnosis not present

## 2016-07-07 DIAGNOSIS — G35 Multiple sclerosis: Secondary | ICD-10-CM | POA: Diagnosis not present

## 2016-07-07 DIAGNOSIS — M19011 Primary osteoarthritis, right shoulder: Secondary | ICD-10-CM | POA: Diagnosis not present

## 2016-07-07 DIAGNOSIS — M8580 Other specified disorders of bone density and structure, unspecified site: Secondary | ICD-10-CM | POA: Diagnosis not present

## 2016-07-07 DIAGNOSIS — G5601 Carpal tunnel syndrome, right upper limb: Secondary | ICD-10-CM | POA: Diagnosis not present

## 2016-07-07 DIAGNOSIS — M810 Age-related osteoporosis without current pathological fracture: Secondary | ICD-10-CM | POA: Diagnosis not present

## 2016-07-07 DIAGNOSIS — J45909 Unspecified asthma, uncomplicated: Secondary | ICD-10-CM | POA: Diagnosis not present

## 2016-07-07 DIAGNOSIS — K589 Irritable bowel syndrome without diarrhea: Secondary | ICD-10-CM | POA: Diagnosis not present

## 2016-07-07 NOTE — Telephone Encounter (Signed)
Order for mammogram placed and letter sent

## 2016-07-11 DIAGNOSIS — N319 Neuromuscular dysfunction of bladder, unspecified: Secondary | ICD-10-CM | POA: Diagnosis not present

## 2016-07-11 DIAGNOSIS — M19011 Primary osteoarthritis, right shoulder: Secondary | ICD-10-CM | POA: Diagnosis not present

## 2016-07-11 DIAGNOSIS — G5601 Carpal tunnel syndrome, right upper limb: Secondary | ICD-10-CM | POA: Diagnosis not present

## 2016-07-11 DIAGNOSIS — I10 Essential (primary) hypertension: Secondary | ICD-10-CM | POA: Diagnosis not present

## 2016-07-11 DIAGNOSIS — E538 Deficiency of other specified B group vitamins: Secondary | ICD-10-CM | POA: Diagnosis not present

## 2016-07-11 DIAGNOSIS — G35 Multiple sclerosis: Secondary | ICD-10-CM | POA: Diagnosis not present

## 2016-07-11 DIAGNOSIS — K909 Intestinal malabsorption, unspecified: Secondary | ICD-10-CM | POA: Diagnosis not present

## 2016-07-11 DIAGNOSIS — M8580 Other specified disorders of bone density and structure, unspecified site: Secondary | ICD-10-CM | POA: Diagnosis not present

## 2016-07-11 DIAGNOSIS — J45909 Unspecified asthma, uncomplicated: Secondary | ICD-10-CM | POA: Diagnosis not present

## 2016-07-11 DIAGNOSIS — K589 Irritable bowel syndrome without diarrhea: Secondary | ICD-10-CM | POA: Diagnosis not present

## 2016-07-11 DIAGNOSIS — D509 Iron deficiency anemia, unspecified: Secondary | ICD-10-CM | POA: Diagnosis not present

## 2016-07-11 DIAGNOSIS — M810 Age-related osteoporosis without current pathological fracture: Secondary | ICD-10-CM | POA: Diagnosis not present

## 2016-07-13 DIAGNOSIS — I10 Essential (primary) hypertension: Secondary | ICD-10-CM | POA: Diagnosis not present

## 2016-07-13 DIAGNOSIS — M8580 Other specified disorders of bone density and structure, unspecified site: Secondary | ICD-10-CM | POA: Diagnosis not present

## 2016-07-13 DIAGNOSIS — K909 Intestinal malabsorption, unspecified: Secondary | ICD-10-CM | POA: Diagnosis not present

## 2016-07-13 DIAGNOSIS — E538 Deficiency of other specified B group vitamins: Secondary | ICD-10-CM | POA: Diagnosis not present

## 2016-07-13 DIAGNOSIS — G5601 Carpal tunnel syndrome, right upper limb: Secondary | ICD-10-CM | POA: Diagnosis not present

## 2016-07-13 DIAGNOSIS — K589 Irritable bowel syndrome without diarrhea: Secondary | ICD-10-CM | POA: Diagnosis not present

## 2016-07-13 DIAGNOSIS — N319 Neuromuscular dysfunction of bladder, unspecified: Secondary | ICD-10-CM | POA: Diagnosis not present

## 2016-07-13 DIAGNOSIS — J45909 Unspecified asthma, uncomplicated: Secondary | ICD-10-CM | POA: Diagnosis not present

## 2016-07-13 DIAGNOSIS — M19011 Primary osteoarthritis, right shoulder: Secondary | ICD-10-CM | POA: Diagnosis not present

## 2016-07-13 DIAGNOSIS — M810 Age-related osteoporosis without current pathological fracture: Secondary | ICD-10-CM | POA: Diagnosis not present

## 2016-07-13 DIAGNOSIS — G35 Multiple sclerosis: Secondary | ICD-10-CM | POA: Diagnosis not present

## 2016-07-13 DIAGNOSIS — D509 Iron deficiency anemia, unspecified: Secondary | ICD-10-CM | POA: Diagnosis not present

## 2016-07-16 NOTE — Telephone Encounter (Signed)
Please check with patient that her PT is set up.

## 2016-07-17 ENCOUNTER — Telehealth: Payer: Self-pay | Admitting: Neurology

## 2016-07-17 NOTE — Telephone Encounter (Signed)
Called and LVM for pt to call. Wanted to make sure she was set up for PT via  [facility] okay. Dr Krista Blue back in office today. She has been out of the [place] and wanted to check on her.

## 2016-07-17 NOTE — Telephone Encounter (Signed)
Patient is returning a call. °

## 2016-07-17 NOTE — Telephone Encounter (Signed)
Called pt. She was set up with [facility]. Last day of PT tomorrow per pt. Pain has decreased. Doing exercises at home. She is much happier with [facility]. Feeling much better. She has no questions or concerns right now. She will call if she has any. She stated she appreciated the call.

## 2016-07-17 NOTE — Telephone Encounter (Signed)
Duplicate.  See other phone note.

## 2016-07-18 DIAGNOSIS — M8580 Other specified disorders of bone density and structure, unspecified site: Secondary | ICD-10-CM | POA: Diagnosis not present

## 2016-07-18 DIAGNOSIS — N319 Neuromuscular dysfunction of bladder, unspecified: Secondary | ICD-10-CM | POA: Diagnosis not present

## 2016-07-18 DIAGNOSIS — G5601 Carpal tunnel syndrome, right upper limb: Secondary | ICD-10-CM | POA: Diagnosis not present

## 2016-07-18 DIAGNOSIS — E538 Deficiency of other specified B group vitamins: Secondary | ICD-10-CM | POA: Diagnosis not present

## 2016-07-18 DIAGNOSIS — M19011 Primary osteoarthritis, right shoulder: Secondary | ICD-10-CM | POA: Diagnosis not present

## 2016-07-18 DIAGNOSIS — K589 Irritable bowel syndrome without diarrhea: Secondary | ICD-10-CM | POA: Diagnosis not present

## 2016-07-18 DIAGNOSIS — M810 Age-related osteoporosis without current pathological fracture: Secondary | ICD-10-CM | POA: Diagnosis not present

## 2016-07-18 DIAGNOSIS — K909 Intestinal malabsorption, unspecified: Secondary | ICD-10-CM | POA: Diagnosis not present

## 2016-07-18 DIAGNOSIS — I10 Essential (primary) hypertension: Secondary | ICD-10-CM | POA: Diagnosis not present

## 2016-07-18 DIAGNOSIS — G35 Multiple sclerosis: Secondary | ICD-10-CM | POA: Diagnosis not present

## 2016-07-18 DIAGNOSIS — D509 Iron deficiency anemia, unspecified: Secondary | ICD-10-CM | POA: Diagnosis not present

## 2016-07-18 DIAGNOSIS — J45909 Unspecified asthma, uncomplicated: Secondary | ICD-10-CM | POA: Diagnosis not present

## 2016-07-20 DIAGNOSIS — M810 Age-related osteoporosis without current pathological fracture: Secondary | ICD-10-CM | POA: Diagnosis not present

## 2016-07-20 DIAGNOSIS — M8580 Other specified disorders of bone density and structure, unspecified site: Secondary | ICD-10-CM | POA: Diagnosis not present

## 2016-07-20 DIAGNOSIS — G35 Multiple sclerosis: Secondary | ICD-10-CM | POA: Diagnosis not present

## 2016-07-20 DIAGNOSIS — J45909 Unspecified asthma, uncomplicated: Secondary | ICD-10-CM | POA: Diagnosis not present

## 2016-07-20 DIAGNOSIS — K909 Intestinal malabsorption, unspecified: Secondary | ICD-10-CM | POA: Diagnosis not present

## 2016-07-20 DIAGNOSIS — D509 Iron deficiency anemia, unspecified: Secondary | ICD-10-CM | POA: Diagnosis not present

## 2016-07-20 DIAGNOSIS — G5601 Carpal tunnel syndrome, right upper limb: Secondary | ICD-10-CM | POA: Diagnosis not present

## 2016-07-20 DIAGNOSIS — K589 Irritable bowel syndrome without diarrhea: Secondary | ICD-10-CM | POA: Diagnosis not present

## 2016-07-20 DIAGNOSIS — I10 Essential (primary) hypertension: Secondary | ICD-10-CM | POA: Diagnosis not present

## 2016-07-20 DIAGNOSIS — M19011 Primary osteoarthritis, right shoulder: Secondary | ICD-10-CM | POA: Diagnosis not present

## 2016-07-20 DIAGNOSIS — E538 Deficiency of other specified B group vitamins: Secondary | ICD-10-CM | POA: Diagnosis not present

## 2016-07-20 DIAGNOSIS — N319 Neuromuscular dysfunction of bladder, unspecified: Secondary | ICD-10-CM | POA: Diagnosis not present

## 2016-07-21 ENCOUNTER — Other Ambulatory Visit: Payer: Self-pay | Admitting: Internal Medicine

## 2016-07-21 DIAGNOSIS — Z1231 Encounter for screening mammogram for malignant neoplasm of breast: Secondary | ICD-10-CM

## 2016-08-07 ENCOUNTER — Ambulatory Visit
Admission: RE | Admit: 2016-08-07 | Discharge: 2016-08-07 | Disposition: A | Discharge status: Home / Self Care | Payer: Medicare Other | Source: Ambulatory Visit | Attending: Internal Medicine | Admitting: Internal Medicine

## 2016-08-07 DIAGNOSIS — Z1231 Encounter for screening mammogram for malignant neoplasm of breast: Secondary | ICD-10-CM | POA: Diagnosis not present

## 2016-08-08 ENCOUNTER — Other Ambulatory Visit: Payer: Self-pay | Admitting: Internal Medicine

## 2016-08-08 DIAGNOSIS — R928 Other abnormal and inconclusive findings on diagnostic imaging of breast: Secondary | ICD-10-CM

## 2016-08-14 ENCOUNTER — Ambulatory Visit
Admission: RE | Admit: 2016-08-14 | Discharge: 2016-08-14 | Disposition: A | Discharge status: Home / Self Care | Payer: Medicare Other | Source: Ambulatory Visit | Attending: Internal Medicine | Admitting: Internal Medicine

## 2016-08-14 ENCOUNTER — Other Ambulatory Visit: Payer: Self-pay | Admitting: Internal Medicine

## 2016-08-14 DIAGNOSIS — N6322 Unspecified lump in the left breast, upper inner quadrant: Secondary | ICD-10-CM | POA: Diagnosis not present

## 2016-08-14 DIAGNOSIS — R928 Other abnormal and inconclusive findings on diagnostic imaging of breast: Secondary | ICD-10-CM

## 2016-08-14 DIAGNOSIS — N6321 Unspecified lump in the left breast, upper outer quadrant: Secondary | ICD-10-CM | POA: Diagnosis not present

## 2016-08-14 DIAGNOSIS — R922 Inconclusive mammogram: Secondary | ICD-10-CM | POA: Diagnosis not present

## 2016-08-14 DIAGNOSIS — N6320 Unspecified lump in the left breast, unspecified quadrant: Secondary | ICD-10-CM

## 2016-08-28 DIAGNOSIS — E538 Deficiency of other specified B group vitamins: Secondary | ICD-10-CM | POA: Diagnosis not present

## 2016-08-28 DIAGNOSIS — N319 Neuromuscular dysfunction of bladder, unspecified: Secondary | ICD-10-CM | POA: Diagnosis not present

## 2016-08-28 DIAGNOSIS — G35 Multiple sclerosis: Secondary | ICD-10-CM | POA: Diagnosis not present

## 2016-08-28 DIAGNOSIS — M810 Age-related osteoporosis without current pathological fracture: Secondary | ICD-10-CM | POA: Diagnosis not present

## 2016-08-28 DIAGNOSIS — G5601 Carpal tunnel syndrome, right upper limb: Secondary | ICD-10-CM | POA: Diagnosis not present

## 2016-08-28 DIAGNOSIS — M19011 Primary osteoarthritis, right shoulder: Secondary | ICD-10-CM | POA: Diagnosis not present

## 2016-08-28 DIAGNOSIS — M8580 Other specified disorders of bone density and structure, unspecified site: Secondary | ICD-10-CM | POA: Diagnosis not present

## 2016-09-01 ENCOUNTER — Ambulatory Visit: Payer: Medicare Other | Admitting: Internal Medicine

## 2016-09-12 ENCOUNTER — Other Ambulatory Visit: Payer: Self-pay | Admitting: Internal Medicine

## 2016-09-12 NOTE — Telephone Encounter (Signed)
Refill x 6 months- must call this in

## 2016-09-29 ENCOUNTER — Telehealth: Payer: Self-pay | Admitting: Internal Medicine

## 2016-09-29 NOTE — Telephone Encounter (Signed)
Pt is aware.  

## 2016-09-29 NOTE — Telephone Encounter (Signed)
Patient is calling to request a Rx for small needles for her B12 injection.  States that when she calls the pharmacy to request needles, they want to provide the large needles and they are too big for her.  You had given her a smaller needle and told her due to her size that she needed the smaller needle as well.  So, she wants to know if you will write her a Rx for a smaller size needle so she can get those please.   [facility] [address]  Best # for contact: [phone]   Thank you.

## 2016-09-29 NOTE — Telephone Encounter (Signed)
We have some here she can pick up. Just give her 11

## 2016-10-03 ENCOUNTER — Ambulatory Visit (INDEPENDENT_AMBULATORY_CARE_PROVIDER_SITE_OTHER): Payer: Medicare Other | Admitting: Internal Medicine

## 2016-10-03 DIAGNOSIS — Z23 Encounter for immunization: Secondary | ICD-10-CM | POA: Diagnosis not present

## 2016-10-03 NOTE — Patient Instructions (Signed)
Flu vaccine given.

## 2016-10-03 NOTE — Progress Notes (Signed)
   Subjective:    Patient ID: Erika Murphy, female    DOB: 1950-09-09, 66 y.o.   MRN: [id number]  HPI Flu vaccine given    Review of Systems     Objective:   Physical Exam        Assessment & Plan:

## 2016-11-10 DIAGNOSIS — H40013 Open angle with borderline findings, low risk, bilateral: Secondary | ICD-10-CM | POA: Diagnosis not present

## 2016-11-10 DIAGNOSIS — H524 Presbyopia: Secondary | ICD-10-CM | POA: Diagnosis not present

## 2016-11-10 DIAGNOSIS — H52223 Regular astigmatism, bilateral: Secondary | ICD-10-CM | POA: Diagnosis not present

## 2016-11-10 DIAGNOSIS — H01003 Unspecified blepharitis right eye, unspecified eyelid: Secondary | ICD-10-CM | POA: Diagnosis not present

## 2016-12-04 NOTE — Telephone Encounter (Signed)
Discontinued/re-ordered.  Thanks. °

## 2016-12-12 ENCOUNTER — Ambulatory Visit: Payer: Medicare Other | Admitting: Hematology & Oncology

## 2016-12-12 ENCOUNTER — Other Ambulatory Visit: Payer: Medicare Other

## 2017-01-03 ENCOUNTER — Other Ambulatory Visit: Payer: Medicare Other

## 2017-01-03 ENCOUNTER — Ambulatory Visit: Payer: Medicare Other | Admitting: Hematology & Oncology

## 2017-01-18 ENCOUNTER — Ambulatory Visit
Admission: RE | Admit: 2017-01-18 | Discharge: 2017-01-18 | Disposition: A | Discharge status: Home / Self Care | Payer: Medicare Other | Source: Ambulatory Visit | Attending: Internal Medicine | Admitting: Internal Medicine

## 2017-01-18 ENCOUNTER — Encounter: Payer: Self-pay | Admitting: Internal Medicine

## 2017-01-18 ENCOUNTER — Ambulatory Visit (INDEPENDENT_AMBULATORY_CARE_PROVIDER_SITE_OTHER): Payer: Medicare Other | Admitting: Internal Medicine

## 2017-01-18 VITALS — BP 110/60 | HR 77 | Ht 65.0 in | Wt 98.3 lb

## 2017-01-18 DIAGNOSIS — G35 Multiple sclerosis: Secondary | ICD-10-CM | POA: Diagnosis not present

## 2017-01-18 DIAGNOSIS — Z87898 Personal history of other specified conditions: Secondary | ICD-10-CM

## 2017-01-18 DIAGNOSIS — R0600 Dyspnea, unspecified: Secondary | ICD-10-CM | POA: Diagnosis not present

## 2017-01-18 DIAGNOSIS — R05 Cough: Secondary | ICD-10-CM | POA: Diagnosis not present

## 2017-01-18 DIAGNOSIS — R079 Chest pain, unspecified: Secondary | ICD-10-CM

## 2017-01-18 DIAGNOSIS — I10 Essential (primary) hypertension: Secondary | ICD-10-CM | POA: Diagnosis not present

## 2017-01-18 DIAGNOSIS — G35D Multiple sclerosis, unspecified: Secondary | ICD-10-CM

## 2017-01-18 DIAGNOSIS — R0602 Shortness of breath: Secondary | ICD-10-CM

## 2017-01-18 LAB — CBC WITH DIFFERENTIAL/PLATELET
BASOS PCT: 0.9 %
Basophils Absolute: 29 cells/uL (ref 0–200)
EOS PCT: 3.1 %
Eosinophils Absolute: 99 cells/uL (ref 15–500)
HEMATOCRIT: 35.5 % (ref 35.0–45.0)
HEMOGLOBIN: 11.6 g/dL — AB (ref 11.7–15.5)
LYMPHS ABS: 966 {cells}/uL (ref 850–3900)
MCH: 29.2 pg (ref 27.0–33.0)
MCHC: 32.7 g/dL (ref 32.0–36.0)
MCV: 89.4 fL (ref 80.0–100.0)
MPV: 11.1 fL (ref 7.5–12.5)
Monocytes Relative: 11.7 %
NEUTROS ABS: 1731 {cells}/uL (ref 1500–7800)
NEUTROS PCT: 54.1 %
Platelets: 170 10*3/uL (ref 140–400)
RBC: 3.97 10*6/uL (ref 3.80–5.10)
RDW: 11.8 % (ref 11.0–15.0)
Total Lymphocyte: 30.2 %
WBC mixed population: 374 cells/uL (ref 200–950)
WBC: 3.2 10*3/uL — AB (ref 3.8–10.8)

## 2017-01-18 LAB — COMPLETE METABOLIC PANEL WITH GFR
AG Ratio: 1.3 (calc) (ref 1.0–2.5)
ALBUMIN MSPROF: 3.7 g/dL (ref 3.6–5.1)
ALKALINE PHOSPHATASE (APISO): 88 U/L (ref 33–130)
ALT: 11 U/L (ref 6–29)
AST: 19 U/L (ref 10–35)
BUN / CREAT RATIO: 14 (calc) (ref 6–22)
BUN: 17 mg/dL (ref 7–25)
CO2: 31 mmol/L (ref 20–32)
CREATININE: 1.18 mg/dL — AB (ref 0.50–0.99)
Calcium: 9.3 mg/dL (ref 8.6–10.4)
Chloride: 109 mmol/L (ref 98–110)
GFR, Est African American: 55 mL/min/{1.73_m2} — ABNORMAL LOW (ref >=60)
GFR, Est Non African American: 47 mL/min/{1.73_m2} — ABNORMAL LOW (ref >=60)
GLUCOSE: 54 mg/dL — AB (ref 65–99)
Globulin: 2.8 g/dL (calc) (ref 1.9–3.7)
Potassium: 4.1 mmol/L (ref 3.5–5.3)
Sodium: 145 mmol/L (ref 135–146)
TOTAL PROTEIN: 6.5 g/dL (ref 6.1–8.1)
Total Bilirubin: 0.6 mg/dL (ref 0.2–1.2)

## 2017-01-18 LAB — TSH: TSH: 0.24 mIU/L — ABNORMAL LOW (ref 0.40–4.50)

## 2017-01-18 NOTE — Progress Notes (Signed)
   Subjective:    Patient ID: Erika Murphy, female    DOB: 1950-04-26, 67 y.o.   MRN: [id number]  HPI Hx right shoulder arthropathy. Has had steroid injection at orthopedist several years ago. Has had episodes of chest pain. Has dyspnea when walking short distances. History of MS but not on any MS meds. Has had some right hand will spasm sometimes. Takes Baclofen. Only happened twice.  Difficult to obtain good concise history from pt.  FHx: Mother with Hx rheumatic heart disease died of CHF from complications of rheumatic fever. Never had valve replacement. Hx of stroke.  Father died at age 3.  He had blindness and was diabetic.  2 sisters with diabetes.  One brother died with some type of GI cancer perhaps liver cancer.  Sometimes she has to ambulate with a cane due to gait disorder.  Has had 3-4 episodes of chest pain. Starts in neck and go down right arm and substernal chest pain.Admits to diaphoresis with chest pain. Pain was at night in bed.  See dictation May 09, 2016.  History of left shoulder arthroplasty 2011.  History of B12 deficiency and takes monthly B12 injections.  History of gastric carcinoma status post surgery which included subtotal gastrectomy and partial omentectomy in 2001 followed by Dr. Marin Olp.  No known drug allergies  Has distal spastic gait disorder with more distal weakness than proximal weakness.  She took Betaseron for a while but did not like the way it made her feel.  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  She worked in Therapist, sports.  She is now disabled.  Does not smoke or consume alcohol and resides alone.  Review of Systems walks to bus stop daily to pick up grandchild and has SOB     Objective:   Physical Exam Weight 98.5 pounds was 100 pounds in June at Dr. Vernona Rieger office  Skin warm and dry.  Nodes none.  Chest is clear.  No JVD thyromegaly or carotid bruits.  Cardiac exam regular rate and  rhythm normal S1 and.  Extremities without edema.       Assessment & Plan:  Chest pain-etiology unclear.  Will refer to cardiology for further evaluation  Dyspnea-to have chest x-ray  Multiple sclerosis- not on treatment  History of gastric carcinoma  Followed by Dr. Marin Olp  Anemia  EKG is unchanged from EKG in 2016.  Chest x-ray is negative.  CBC is normal with the exception of slightly low white blood cell count of 3200.  Hemoglobin 11.6 g and previously was 12.4 g 7 months ago.  Iron and iron binding capacity in June were normal.  TSH is pending as is C met.  Hemoglobin has ranged from the 12 range to 11.6 range frequently. Have her come back to check Fe/TIBC and B12 levels. Cardiology consult.

## 2017-01-18 NOTE — Patient Instructions (Addendum)
Cardiology consult. Check iron and TIBC along with B12 level.

## 2017-01-19 ENCOUNTER — Other Ambulatory Visit: Payer: Self-pay | Admitting: Internal Medicine

## 2017-01-19 DIAGNOSIS — D649 Anemia, unspecified: Secondary | ICD-10-CM

## 2017-01-23 ENCOUNTER — Other Ambulatory Visit: Payer: Medicare Other | Admitting: Internal Medicine

## 2017-01-23 DIAGNOSIS — G35 Multiple sclerosis: Secondary | ICD-10-CM | POA: Diagnosis not present

## 2017-01-23 DIAGNOSIS — R079 Chest pain, unspecified: Secondary | ICD-10-CM | POA: Diagnosis not present

## 2017-01-23 DIAGNOSIS — E538 Deficiency of other specified B group vitamins: Secondary | ICD-10-CM | POA: Diagnosis not present

## 2017-01-23 DIAGNOSIS — D649 Anemia, unspecified: Secondary | ICD-10-CM | POA: Diagnosis not present

## 2017-01-23 DIAGNOSIS — K909 Intestinal malabsorption, unspecified: Secondary | ICD-10-CM | POA: Diagnosis not present

## 2017-01-23 DIAGNOSIS — R0602 Shortness of breath: Secondary | ICD-10-CM | POA: Diagnosis not present

## 2017-01-23 DIAGNOSIS — R0600 Dyspnea, unspecified: Secondary | ICD-10-CM | POA: Diagnosis not present

## 2017-01-23 DIAGNOSIS — I10 Essential (primary) hypertension: Secondary | ICD-10-CM | POA: Diagnosis not present

## 2017-01-23 DIAGNOSIS — D508 Other iron deficiency anemias: Secondary | ICD-10-CM | POA: Diagnosis not present

## 2017-01-24 LAB — IRON,TIBC AND FERRITIN PANEL
%SAT: 37 % (calc) (ref 11–50)
Ferritin: 60 ng/mL (ref 20–288)
IRON: 125 ug/dL (ref 45–160)
TIBC: 334 mcg/dL (calc) (ref 250–450)

## 2017-01-24 LAB — TSH: TSH: 0.92 m[IU]/L (ref 0.40–4.50)

## 2017-01-24 LAB — VITAMIN B12: Vitamin B-12: 462 pg/mL (ref 200–1100)

## 2017-01-25 ENCOUNTER — Encounter: Payer: Self-pay | Admitting: Hematology & Oncology

## 2017-01-25 ENCOUNTER — Inpatient Hospital Stay: Payer: Medicare Other | Attending: Hematology & Oncology

## 2017-01-25 ENCOUNTER — Inpatient Hospital Stay: Payer: Medicare Other | Admitting: Hematology & Oncology

## 2017-01-25 ENCOUNTER — Other Ambulatory Visit: Payer: Self-pay

## 2017-01-25 VITALS — BP 140/67 | HR 91 | Temp 98.5°F | Resp 18 | Wt 99.0 lb

## 2017-01-25 DIAGNOSIS — Z9221 Personal history of antineoplastic chemotherapy: Secondary | ICD-10-CM | POA: Insufficient documentation

## 2017-01-25 DIAGNOSIS — Z85028 Personal history of other malignant neoplasm of stomach: Secondary | ICD-10-CM | POA: Diagnosis not present

## 2017-01-25 DIAGNOSIS — D002 Carcinoma in situ of stomach: Secondary | ICD-10-CM

## 2017-01-25 DIAGNOSIS — K909 Intestinal malabsorption, unspecified: Secondary | ICD-10-CM

## 2017-01-25 DIAGNOSIS — E538 Deficiency of other specified B group vitamins: Secondary | ICD-10-CM

## 2017-01-25 DIAGNOSIS — D508 Other iron deficiency anemias: Secondary | ICD-10-CM

## 2017-01-25 DIAGNOSIS — D500 Iron deficiency anemia secondary to blood loss (chronic): Secondary | ICD-10-CM

## 2017-01-25 NOTE — Progress Notes (Signed)
Hematology and Oncology Follow Up Visit  Erika Murphy [id number] 11-11-1950 67 y.o. 01/25/2017   Principle Diagnosis:   Stage II (T3N0M0) adenocarcinoma of the stomach-remission Recurrent iron deficiency anemia  Pernicious anemia Multiple sclerosis Current Therapy:    IV iron as indicated  Vitamin B-12 1 mg IM every month-done at home     Interim History:  [name] is back for follow-up.  She comes in every 6 months to see [place].  She does feel little bit under the weather.  She saw her family doctor recently.  She had lab work done.  The only thing that I saw that looked unusual was that her blood sugar was on the low side.  She apparently needs a colonoscopy and an upper endoscopy.  She will talk to her family doctor about this.  Her multiple sclerosis is not been a problem for her.  She has had no flareups of the multiple sclerosis.  Her iron studies have been doing pretty well.  Her iron studies back last week showed a ferritin of 60 with iron saturation of 37%.  She does her vitamin B12 at home.  Her level a couple days ago was 462.  He has had no fever.  She has had no cough.  She has had no issues with the flu or other infections.  Overall, I would say that her performance status is ECOG 2.  She will be going on another cruise with the family in August.   Medications:  Current Outpatient Medications:  .  albuterol (PROVENTIL HFA;VENTOLIN HFA) 108 (90 Base) MCG/ACT inhaler, Inhale 2 puffs into the lungs every 6 (six) hours as needed for wheezing or shortness of breath., Disp: 1 Inhaler, Rfl: 12 .  amLODipine (NORVASC) 2.5 MG tablet, Take 1 tablet (2.5 mg total) by mouth daily., Disp: 90 tablet, Rfl: 3 .  Cholecalciferol (VITAMIN D3) 2000 units TABS, Take by mouth daily., Disp: , Rfl:  .  cyanocobalamin (,VITAMIN B-12,) 1000 MCG/ML injection, INJECT 1 ML (1,000 MCG TOTAL) INTO THE MUSCLE ONCE A MONTH AS DIRECTED, Disp: 10 mL, Rfl: prn .  diazepam (VALIUM) 5 MG tablet,  TAKE 1 TABLET BY MOUTH TWICE A DAY AS NEEDED, Disp: 180 tablet, Rfl: 0 .  LYRICA 50 MG capsule, TAKE ONE CAPSULE 3 TIMES A DAY, Disp: 90 capsule, Rfl: 5 .  pantoprazole (PROTONIX) 40 MG tablet, TAKE 1 TABLET (40 MG TOTAL) BY MOUTH DAILY., Disp: 90 tablet, Rfl: 3  Allergies: No Known Allergies  Past Medical History, Surgical history, Social history, and Family History were reviewed and updated.  Review of Systems: Review of Systems  Constitutional: Positive for malaise/fatigue.  HENT: Positive for tinnitus.   Eyes: Positive for blurred vision.  Respiratory: Negative.   Cardiovascular: Negative.   Gastrointestinal: Positive for diarrhea.  Genitourinary: Negative.   Musculoskeletal: Positive for myalgias.  Skin: Negative.   Neurological: Positive for tingling and weakness.  Endo/Heme/Allergies: Negative.   Psychiatric/Behavioral: Negative.     Physical Exam:  weight is 99 lb (44.9 kg). Her oral temperature is 98.5 F (36.9 C). Her blood pressure is 140/67 and her pulse is 91. Her respiration is 18 and oxygen saturation is 100%.   Physical Exam  Constitutional: She is oriented to person, place, and time.  HENT:  Head: Normocephalic and atraumatic.  Mouth/Throat: Oropharynx is clear and moist.  Eyes: EOM are normal. Pupils are equal, round, and reactive to light.  Neck: Normal range of motion.  Cardiovascular: Normal rate, regular rhythm  and normal heart sounds.  Pulmonary/Chest: Effort normal and breath sounds normal.  Abdominal: Soft. Bowel sounds are normal.  Musculoskeletal: Normal range of motion. She exhibits no edema, tenderness or deformity.  Lymphadenopathy:    She has no cervical adenopathy.  Neurological: She is alert and oriented to person, place, and time.  Skin: Skin is warm and dry. No rash noted. No erythema.  Psychiatric: She has a normal mood and affect. Her behavior is normal. Judgment and thought content normal.  Vitals reviewed.    Lab Results   Component Value Date   WBC 3.2 (L) 01/18/2017   HGB 11.6 (L) 01/18/2017   HCT 35.5 01/18/2017   MCV 89.4 01/18/2017   PLT 170 01/18/2017     Chemistry      Component Value Date/Time   NA 145 01/18/2017 1205   NA 144 06/12/2016 1151   NA 142 06/11/2015 1156   K 4.1 01/18/2017 1205   K 4.7 06/12/2016 1151   K 4.3 06/11/2015 1156   CL 109 01/18/2017 1205   CL 107 06/12/2016 1151   CO2 31 01/18/2017 1205   CO2 32 06/12/2016 1151   CO2 29 06/11/2015 1156   BUN 17 01/18/2017 1205   BUN 11 06/12/2016 1151   BUN 13.5 06/11/2015 1156   CREATININE 1.18 (H) 01/18/2017 1205   CREATININE 1.2 (H) 06/11/2015 1156      Component Value Date/Time   CALCIUM 9.3 01/18/2017 1205   CALCIUM 9.6 06/12/2016 1151   CALCIUM 9.7 06/11/2015 1156   ALKPHOS 97 (H) 06/12/2016 1151   ALKPHOS 111 06/11/2015 1156   AST 19 01/18/2017 1205   AST 34 06/12/2016 1151   AST 30 06/11/2015 1156   ALT 11 01/18/2017 1205   ALT 26 06/12/2016 1151   ALT 23 06/11/2015 1156   BILITOT 0.6 01/18/2017 1205   BILITOT 1.00 06/12/2016 1151   BILITOT 1.03 06/11/2015 1156      Impression and Plan: Erika Murphy is a 67 year old African [place] female. She had a history of stage II stomach cancer. She underwent resection. She had adjuvant therapy. This is now about 20 years out from treatment. She is cured of the stomach cancer.  Not sure why she had this episode of weakness and dizziness.  It is certainly possible that it might be from hypoglycemia.  I know that I would like to see her back before her next cruise.  She will be 6 months from now.  She does need a colonoscopy and possible upper endoscopy.  She will see her family doctor, Dr. Tommie Ard Baxley for this.   [name], MD 1/24/201910:35 AM

## 2017-01-25 NOTE — Progress Notes (Signed)
We will contact her about endoscopy and colonoscopy. Thanks!

## 2017-02-07 ENCOUNTER — Encounter: Payer: Self-pay | Admitting: Cardiovascular Disease

## 2017-02-07 ENCOUNTER — Ambulatory Visit: Payer: Medicare Other | Admitting: Cardiovascular Disease

## 2017-02-07 VITALS — BP 138/74 | HR 71 | Ht 65.0 in | Wt 98.0 lb

## 2017-02-07 DIAGNOSIS — R0789 Other chest pain: Secondary | ICD-10-CM

## 2017-02-07 NOTE — Patient Instructions (Signed)
Medication Instructions: Your physician recommends that you continue on your current medications as directed. Please refer to the Current Medication list given to you today.   Testing/Procedures: Your physician has requested that you have a lexiscan myoview. For further information please visit [email or web address]. Please follow instruction sheet, as given.  Follow-Up: Your physician recommends that you schedule a follow-up appointment as needed with Dr. Berry.    

## 2017-02-07 NOTE — Assessment & Plan Note (Signed)
Erika Murphy  was referred to me by Dr. Renold Genta for atypical chest pain. She has no cardiac risk factors patient does have multiple sclerosis which makes ambulation difficult. The pain began a month ago. It is substernal and has rated to her neck. Because of her inability to exercise I'm going to get a form of Myoview stress test to risk stratify her.

## 2017-02-07 NOTE — Progress Notes (Signed)
02/07/2017 Erika Murphy   1950-03-09  [id number]  Primary Physician Baxley, Cresenciano Lick, MD Primary Cardiologist: Lorretta Harp MD Lupe Carney, [place]  HPI:  Erika Murphy is a 67 y.o. thin and frail-appearing divorced African-American female mother of 3, grandmother of 76 grandchildren who is retired Retail banker at [employer]. She was referred by Dr. Renold Genta for cardiovascular evaluation because of atypical chest pain of new onset. She has no cardiac crease factors. She does have a history of stomach cancer having undergone subtotal gastrectomy back in 2000. She also has multiple sclerosis. She's had a sister who's had bypass surgery. She's never had a heart attack or stroke. I will last month she's had off and on left substernal chest pain with radiation to her neck and jaw.   Current Meds  Medication Sig  . albuterol (PROVENTIL HFA;VENTOLIN HFA) 108 (90 Base) MCG/ACT inhaler Inhale 2 puffs into the lungs every 6 (six) hours as needed for wheezing or shortness of breath.  Marland Kitchen amLODipine (NORVASC) 2.5 MG tablet Take 1 tablet (2.5 mg total) by mouth daily.  . cyanocobalamin (,VITAMIN B-12,) 1000 MCG/ML injection INJECT 1 ML (1,000 MCG TOTAL) INTO THE MUSCLE ONCE A MONTH AS DIRECTED  . diazepam (VALIUM) 5 MG tablet TAKE 1 TABLET BY MOUTH TWICE A DAY AS NEEDED  . LYRICA 50 MG capsule TAKE ONE CAPSULE 3 TIMES A DAY  . Multiple Vitamins-Minerals (MULTIVITAMIN GUMMIES WOMENS PO) Take 1 tablet by mouth daily.  . pantoprazole (PROTONIX) 40 MG tablet TAKE 1 TABLET (40 MG TOTAL) BY MOUTH DAILY.     No Known Allergies  Social History   Socioeconomic History  . Marital status: Divorced    Spouse name: Not on file  . Number of children: 3   . Years of education: Not on file  . Highest education level: Not on file  Social Needs  . Financial resource strain: Not on file  . Food insecurity - worry: Not on file  . Food insecurity - inability: Not on file  .  Transportation needs - medical: Not on file  . Transportation needs - non-medical: Not on file  Occupational History  . Occupation: DISABLED    Employer: [employer]  Tobacco Use  . Smoking status: Never Smoker  . Smokeless tobacco: Never Used  . Tobacco comment: NEVER USED TOBACCO  Substance and Sexual Activity  . Alcohol use: No    Alcohol/week: 0.0 oz  . Drug use: No  . Sexual activity: No  Other Topics Concern  . Not on file  Social History Narrative   Patient is retired.    Patient is married with 3 children.   Patient drinks caffeine daily.      Review of Systems: General: negative for chills, fever, night sweats or weight changes.  Cardiovascular: negative for chest pain, dyspnea on exertion, edema, orthopnea, palpitations, paroxysmal nocturnal dyspnea or shortness of breath Dermatological: negative for rash Respiratory: negative for cough or wheezing Urologic: negative for hematuria Abdominal: negative for nausea, vomiting, diarrhea, bright Murphy blood per rectum, melena, or hematemesis Neurologic: negative for visual changes, syncope, or dizziness All other systems reviewed and are otherwise negative except as noted above.    Blood pressure 138/74, pulse 71, height 5\' 5"  (1.651 m), weight 98 lb (44.5 kg), SpO2 100 %.  General appearance: alert and no distress Neck: no adenopathy, no carotid bruit, no JVD, supple, symmetrical, trachea midline and thyroid not enlarged, symmetric, no tenderness/mass/nodules  Lungs: clear to auscultation bilaterally Heart: regular rate and rhythm, S1, S2 normal, no murmur, click, rub or gallop Extremities: extremities normal, atraumatic, no cyanosis or edema Pulses: 2+ and symmetric Skin: Skin color, texture, turgor normal. No rashes or lesions Neurologic: Alert and oriented X 3, normal strength and tone. Normal symmetric reflexes. Normal coordination and gait  EKG not performed today  ASSESSMENT AND PLAN:   Atypical chest pain Ms  Erika Murphy  was referred to me by Dr. Renold Genta for atypical chest pain. She has no cardiac risk factors patient does have multiple sclerosis which makes ambulation difficult. The pain began a month ago. It is substernal and has rated to her neck. Because of her inability to exercise I'm going to get a form of Myoview stress test to risk stratify her.      Lorretta Harp MD FACP,FACC,FAHA, [facility] 02/07/2017 3:26 PM

## 2017-02-13 ENCOUNTER — Other Ambulatory Visit: Payer: Medicare Other

## 2017-02-15 ENCOUNTER — Telehealth (HOSPITAL_COMMUNITY): Payer: Self-pay

## 2017-02-15 NOTE — Telephone Encounter (Signed)
Encounter complete. 

## 2017-02-20 ENCOUNTER — Ambulatory Visit (HOSPITAL_COMMUNITY)
Admission: RE | Admit: 2017-02-20 | Payer: Medicare Other | Source: Ambulatory Visit | Attending: Cardiovascular Disease | Admitting: Cardiovascular Disease

## 2017-02-28 ENCOUNTER — Other Ambulatory Visit: Payer: Self-pay | Admitting: Gastroenterology

## 2017-02-28 DIAGNOSIS — K219 Gastro-esophageal reflux disease without esophagitis: Secondary | ICD-10-CM | POA: Diagnosis not present

## 2017-02-28 DIAGNOSIS — Z85028 Personal history of other malignant neoplasm of stomach: Secondary | ICD-10-CM | POA: Diagnosis not present

## 2017-02-28 DIAGNOSIS — Z1211 Encounter for screening for malignant neoplasm of colon: Secondary | ICD-10-CM | POA: Diagnosis not present

## 2017-03-05 ENCOUNTER — Other Ambulatory Visit: Payer: Medicare Other

## 2017-03-05 ENCOUNTER — Telehealth: Payer: Self-pay | Admitting: Internal Medicine

## 2017-03-05 DIAGNOSIS — F439 Reaction to severe stress, unspecified: Secondary | ICD-10-CM

## 2017-03-05 NOTE — Telephone Encounter (Signed)
Patient states she had a death in the family and had to go to [place].  She forgot her Lyrica.  [facility] in [place].  However, because it's a controlled substance, they need our office to just call the pharmacy in [place].  She wants to know if you will call enough Lyrica to the [facility] in [place] for while she is there for the death/wake/funeral.  She will be there through Thursday of this week.    [facility] [place] [phone] She takes Lyrica 50mg  3 capsules a day.  Phone # for patient:  [phone]  Thank you.

## 2017-03-05 NOTE — Telephone Encounter (Signed)
Not sure if this can be called in but tried to call in 12 capsules- left voice mail with pharmacist. Toni Amend it might not be able to be called in there.

## 2017-03-05 NOTE — Telephone Encounter (Signed)
Pharmacy called back and stated that due to [facility], they are unable to take this Rx by phone.  Will need a written Rx faxed to them at [phone].    Dr. Renold Genta wrote Rx and [facility] [place] @ [phone].  There is a $20 charge for this transaction due to all of the work involved.

## 2017-03-16 ENCOUNTER — Telehealth (HOSPITAL_COMMUNITY): Payer: Self-pay

## 2017-03-16 NOTE — Telephone Encounter (Signed)
Encounter complete. 

## 2017-03-19 ENCOUNTER — Telehealth: Payer: Self-pay | Admitting: *Deleted

## 2017-03-19 NOTE — Telephone Encounter (Signed)
   [facility] Pre-operative Risk Assessment    Request for surgical clearance:  1. What type of surgery is being performed? Colonoscopy   2. When is this surgery scheduled? 05/04/17   3. What type of clearance is required (medical clearance vs. Pharmacy clearance to hold med vs. Both)? medical  4. Are there any medications that need to be held prior to surgery and how long?   5. Practice name and name of physician performing surgery? [facility]    6. What is your office phone and fax number? [phone]   7. Anesthesia type (None, local, MAC, general) ? propofol   Erika Murphy Erika Murphy 03/19/2017, 10:43 AM  _________________________________________________________________   (provider comments below)

## 2017-03-20 ENCOUNTER — Other Ambulatory Visit: Payer: Self-pay | Admitting: Internal Medicine

## 2017-03-20 NOTE — Telephone Encounter (Signed)
This has to be called in as Talise K. Dewilde in order to be linked. Got sent back to me.

## 2017-03-20 NOTE — Telephone Encounter (Signed)
   Patient recently seen by Dr. Gwenlyn Found and the [facility] ordered.  Myoview is scheduled for 03/21/2017.  Will route this to Dr. Gwenlyn Found and ask him to comment on clearance for the colonoscopy after the Myoview is performed.  Rosaria Ferries, PA-C 03/20/2017 4:22 PM Beeper [phone]

## 2017-03-21 ENCOUNTER — Ambulatory Visit (HOSPITAL_COMMUNITY)
Admission: RE | Admit: 2017-03-21 | Discharge: 2017-03-21 | Disposition: A | Discharge status: Home / Self Care | Payer: Medicare Other | Source: Ambulatory Visit | Attending: Cardiovascular Disease | Admitting: Cardiovascular Disease

## 2017-03-21 DIAGNOSIS — R0789 Other chest pain: Secondary | ICD-10-CM | POA: Diagnosis not present

## 2017-03-21 LAB — MYOCARDIAL PERFUSION IMAGING
CHL CUP NUCLEAR SDS: 5
CHL CUP RESTING HR STRESS: 65 {beats}/min
CSEPPHR: 107 {beats}/min
LV sys vol: 23 mL
LVDIAVOL: 72 mL (ref 46–106)
SRS: 0
SSS: 5
TID: 1.12

## 2017-03-21 MED ORDER — TECHNETIUM TC 99M TETROFOSMIN IV KIT
31.2000 | PACK | Freq: Once | INTRAVENOUS | Status: AC | PRN
  Administered 2017-03-21: 31.2 via INTRAVENOUS
  Filled 2017-03-21: qty 32

## 2017-03-21 MED ORDER — TECHNETIUM TC 99M TETROFOSMIN IV KIT
10.1000 | PACK | Freq: Once | INTRAVENOUS | Status: AC | PRN
  Administered 2017-03-21: 10.1 via INTRAVENOUS
  Filled 2017-03-21: qty 11

## 2017-03-21 MED ORDER — REGADENOSON 0.4 MG/5ML IV SOLN
0.4000 mg | Freq: Once | INTRAVENOUS | Status: AC
  Administered 2017-03-21: 0.4 mg via INTRAVENOUS

## 2017-03-22 NOTE — Telephone Encounter (Signed)
   Primary Cardiologist: Quay Burow, MD  Chart reviewed as part of pre-operative protocol coverage. Patient was contacted 03/22/2017 in reference to pre-operative risk assessment for pending surgery as outlined below.  Erika Murphy was last seen on 02/07/17 by Dr. Gwenlyn Found.  Since that day, Erika Murphy has had a normal nuclear stress test on 03/21/17. She is no longer having any chest pain, which was atypical at her visit.   Therefore, based on ACC/AHA guidelines, the patient would be at acceptable risk for the planned procedure without further cardiovascular testing.   I will route this recommendation to the requesting party via Epic fax function and remove from pre-op pool.  Please call with questions.  Daune Perch, NP 03/22/2017, 4:11 PM

## 2017-03-27 ENCOUNTER — Encounter: Payer: Self-pay | Admitting: Internal Medicine

## 2017-03-27 ENCOUNTER — Ambulatory Visit (INDEPENDENT_AMBULATORY_CARE_PROVIDER_SITE_OTHER): Payer: Medicare Other | Admitting: Internal Medicine

## 2017-03-27 ENCOUNTER — Ambulatory Visit
Admission: RE | Admit: 2017-03-27 | Discharge: 2017-03-27 | Disposition: A | Discharge status: Home / Self Care | Payer: Medicare Other | Source: Ambulatory Visit | Attending: Internal Medicine | Admitting: Internal Medicine

## 2017-03-27 VITALS — BP 110/68 | HR 63 | Temp 98.1°F | Ht 65.0 in | Wt 97.0 lb

## 2017-03-27 DIAGNOSIS — G35 Multiple sclerosis: Secondary | ICD-10-CM

## 2017-03-27 DIAGNOSIS — M79671 Pain in right foot: Secondary | ICD-10-CM

## 2017-03-27 DIAGNOSIS — M7989 Other specified soft tissue disorders: Secondary | ICD-10-CM

## 2017-03-27 LAB — URIC ACID: URIC ACID, SERUM: 5.8 mg/dL (ref 2.5–7.0)

## 2017-03-27 NOTE — Patient Instructions (Signed)
Have x-ray of right foot.  Serum uric acid pending.

## 2017-03-27 NOTE — Progress Notes (Signed)
   Subjective:    Patient ID: Erika Murphy, female    DOB: Apr 23, 1950, 67 y.o.   MRN: [id number]  HPI 67 year old Black Female with Multiple sclerosis noticed redness dorsum of foot recently.  She showed this to her daughter who agreed the foot was red.  Denies any injury to the foot.  Also had pain plantar aspect of foot as well.  Has had some pain extending from right lateral leg up to her hip.  She is status post right hip arthroplasty.  I think she has some intermittent pain from the multiple sclerosis.  She is currently not on treatment.    Review of Systems see above     Objective:   Physical Exam  Today there is no redness of the foot.  We did check serum uric acid.  There is no deformity of the foot.  She is walking fine.      Assessment & Plan:  ?  Gout?  Occult stress fracture  Plan: I see no evidence of cellulitis.  She will have x-ray of the right foot and serum uric acid is pending.

## 2017-04-24 ENCOUNTER — Other Ambulatory Visit: Payer: Self-pay | Admitting: Internal Medicine

## 2017-04-24 NOTE — Telephone Encounter (Signed)
Must be called in. Please refill x 6 months

## 2017-05-03 ENCOUNTER — Other Ambulatory Visit: Payer: Self-pay

## 2017-05-03 ENCOUNTER — Encounter (HOSPITAL_COMMUNITY): Payer: Self-pay

## 2017-05-04 ENCOUNTER — Ambulatory Visit (HOSPITAL_COMMUNITY): Payer: Medicare Other | Admitting: Anesthesiology

## 2017-05-04 ENCOUNTER — Encounter (HOSPITAL_COMMUNITY)
Admission: RE | Disposition: A | Discharge status: Home / Self Care | Payer: Self-pay | Source: Ambulatory Visit | Attending: Gastroenterology

## 2017-05-04 ENCOUNTER — Other Ambulatory Visit: Payer: Self-pay

## 2017-05-04 ENCOUNTER — Ambulatory Visit (HOSPITAL_COMMUNITY)
Admission: RE | Admit: 2017-05-04 | Discharge: 2017-05-04 | Disposition: A | Discharge status: Home / Self Care | Payer: Medicare Other | Source: Ambulatory Visit | Attending: Gastroenterology | Admitting: Gastroenterology

## 2017-05-04 DIAGNOSIS — Z980 Intestinal bypass and anastomosis status: Secondary | ICD-10-CM | POA: Insufficient documentation

## 2017-05-04 DIAGNOSIS — Z9221 Personal history of antineoplastic chemotherapy: Secondary | ICD-10-CM | POA: Insufficient documentation

## 2017-05-04 DIAGNOSIS — G35 Multiple sclerosis: Secondary | ICD-10-CM | POA: Insufficient documentation

## 2017-05-04 DIAGNOSIS — Z923 Personal history of irradiation: Secondary | ICD-10-CM | POA: Diagnosis not present

## 2017-05-04 DIAGNOSIS — Z85028 Personal history of other malignant neoplasm of stomach: Secondary | ICD-10-CM | POA: Diagnosis not present

## 2017-05-04 DIAGNOSIS — K317 Polyp of stomach and duodenum: Secondary | ICD-10-CM | POA: Insufficient documentation

## 2017-05-04 DIAGNOSIS — Z1211 Encounter for screening for malignant neoplasm of colon: Secondary | ICD-10-CM | POA: Insufficient documentation

## 2017-05-04 DIAGNOSIS — M810 Age-related osteoporosis without current pathological fracture: Secondary | ICD-10-CM | POA: Insufficient documentation

## 2017-05-04 DIAGNOSIS — Q439 Congenital malformation of intestine, unspecified: Secondary | ICD-10-CM | POA: Insufficient documentation

## 2017-05-04 DIAGNOSIS — Z903 Acquired absence of stomach [part of]: Secondary | ICD-10-CM | POA: Diagnosis not present

## 2017-05-04 DIAGNOSIS — D124 Benign neoplasm of descending colon: Secondary | ICD-10-CM | POA: Insufficient documentation

## 2017-05-04 DIAGNOSIS — K219 Gastro-esophageal reflux disease without esophagitis: Secondary | ICD-10-CM | POA: Diagnosis not present

## 2017-05-04 DIAGNOSIS — R12 Heartburn: Secondary | ICD-10-CM | POA: Insufficient documentation

## 2017-05-04 DIAGNOSIS — E559 Vitamin D deficiency, unspecified: Secondary | ICD-10-CM | POA: Diagnosis not present

## 2017-05-04 DIAGNOSIS — I10 Essential (primary) hypertension: Secondary | ICD-10-CM | POA: Insufficient documentation

## 2017-05-04 DIAGNOSIS — Z96641 Presence of right artificial hip joint: Secondary | ICD-10-CM | POA: Diagnosis not present

## 2017-05-04 DIAGNOSIS — M797 Fibromyalgia: Secondary | ICD-10-CM | POA: Insufficient documentation

## 2017-05-04 HISTORY — DX: Other specified postprocedural states: Z98.890

## 2017-05-04 HISTORY — PX: ESOPHAGOGASTRODUODENOSCOPY (EGD) WITH PROPOFOL: SHX5813

## 2017-05-04 HISTORY — DX: Anxiety disorder, unspecified: F41.9

## 2017-05-04 HISTORY — DX: Nausea with vomiting, unspecified: R11.2

## 2017-05-04 HISTORY — PX: COLONOSCOPY WITH PROPOFOL: SHX5780

## 2017-05-04 SURGERY — ESOPHAGOGASTRODUODENOSCOPY (EGD) WITH PROPOFOL
Anesthesia: Monitor Anesthesia Care

## 2017-05-04 MED ORDER — PROPOFOL 500 MG/50ML IV EMUL
INTRAVENOUS | Status: DC | PRN
  Administered 2017-05-04: 125 ug/kg/min via INTRAVENOUS

## 2017-05-04 MED ORDER — ONDANSETRON HCL 4 MG/2ML IJ SOLN
INTRAMUSCULAR | Status: DC | PRN
  Administered 2017-05-04: 4 mg via INTRAVENOUS

## 2017-05-04 MED ORDER — PROPOFOL 10 MG/ML IV BOLUS
INTRAVENOUS | Status: AC
  Filled 2017-05-04: qty 60

## 2017-05-04 MED ORDER — EPHEDRINE SULFATE-NACL 50-0.9 MG/10ML-% IV SOSY
PREFILLED_SYRINGE | INTRAVENOUS | Status: DC | PRN
  Administered 2017-05-04: 5 mg via INTRAVENOUS

## 2017-05-04 MED ORDER — LACTATED RINGERS IV SOLN
INTRAVENOUS | Status: DC
  Administered 2017-05-04: 11:00:00 via INTRAVENOUS

## 2017-05-04 MED ORDER — PROPOFOL 10 MG/ML IV BOLUS
INTRAVENOUS | Status: DC | PRN
  Administered 2017-05-04 (×2): 20 mg via INTRAVENOUS

## 2017-05-04 SURGICAL SUPPLY — 24 items

## 2017-05-04 NOTE — Op Note (Signed)
[facility] Patient Name: Erika Murphy Procedure Date: 05/04/2017 MRN: [id number] Attending MD: Carol Ada , MD Date of Birth: [birth year] CSN: [id number] Age: 67 Admit Type: Inpatient Procedure:                Upper GI endoscopy Indications:              Heartburn Providers:                Carol Ada, MD, Elmer Ramp. Tilden Dome, RN, Cletis Athens,                            Technician Referring MD:              Medicines:                Propofol per Anesthesia Complications:            No immediate complications. Estimated Blood Loss:     Estimated blood loss was minimal. Procedure:                Pre-Anesthesia Assessment:                           - Prior to the procedure, a History and Physical                            was performed, and patient medications and                            allergies were reviewed. The patient's tolerance of                            previous anesthesia was also reviewed. The risks                            and benefits of the procedure and the sedation                            options and risks were discussed with the patient.                            All questions were answered, and informed consent                            was obtained. Prior Anticoagulants: The patient has                            taken no previous anticoagulant or antiplatelet                            agents. ASA Grade Assessment: III - A patient with                            severe systemic disease. After reviewing the risks  and benefits, the patient was deemed in                            satisfactory condition to undergo the procedure.                           - Sedation was administered by an anesthesia                            professional. Deep sedation was attained.                           After obtaining informed consent, the endoscope was                            passed under direct vision. Throughout the                             procedure, the patient's blood pressure, pulse, and                            oxygen saturations were monitored continuously. The                            EC-3490LI ([id number]) scope was introduced through                            the mouth, and advanced to the afferent and                            efferent jejunal loops. The upper GI endoscopy was                            accomplished without difficulty. The patient                            tolerated the procedure well. Scope In: Scope Out: Findings:      The esophagus was normal.      Evidence of a patent Billroth II gastrojejunostomy was found. The       gastrojejunal anastomosis was characterized by healthy appearing mucosa.       This was traversed. The efferent limb was examined. The afferent limb       was examined.      A single 10 mm sessile polyp with no bleeding and no stigmata of recent       bleeding was found in the gastric body. To stop active bleeding, one       hemostatic clip was successfully placed (MR unsafe). There was no       bleeding at the end of the procedure.      The examined jejunum was normal.      The intent was to remove the polyp with a hot snare, however, it was       very soft and resected easily during the closure of the snare. This was       suggestive of an inflamatory polyp. The remnant  gastric mucosa was       suggestive of bile acid gastritis as there was friability. The polyp       continued to ooze and a single hemoclip was placed, which arrested the       bleeding. Impression:               - Normal esophagus.                           - Patent Billroth II gastrojejunostomy was found,                            characterized by healthy appearing mucosa.                           - A single gastric polyp. Clip (MR unsafe) was                            placed.                           - Normal examined jejunum.                           - No specimens  collected. Moderate Sedation:      N/A- Per Anesthesia Care Recommendation:           - Patient has a contact number available for                            emergencies. The signs and symptoms of potential                            delayed complications were discussed with the                            patient. Return to normal activities tomorrow.                            Written discharge instructions were provided to the                            patient.                           - Resume previous diet.                           - Continue present medications.                           - Await pathology results. Procedure Code(s):        --- Professional ---                           [phone], Esophagogastroduodenoscopy, flexible,  transoral; with control of bleeding, any method Diagnosis Code(s):        --- Professional ---                           K31.7, Polyp of stomach and duodenum                           Z98.0, Intestinal bypass and anastomosis status                           R12, Heartburn CPT copyright 2017 American Medical Association. All rights reserved. The codes documented in this report are preliminary and upon coder review may  be revised to meet current compliance requirements. Carol Ada, MD Carol Ada, MD 05/04/2017 11:57:19 AM This report has been signed electronically. Number of Addenda: 0

## 2017-05-04 NOTE — Op Note (Addendum)
[facility] Patient Name: Erika Murphy Procedure Date: 05/04/2017 MRN: [id number] Attending MD: Carol Ada , MD Date of Birth: [birth year] CSN: [id number] Age: 67 Admit Type: Inpatient Procedure:                Colonoscopy Indications:              Screening for colorectal malignant neoplasm Providers:                Carol Ada, MD, Tory Emerald, RN, Cletis Athens,                            Technician Referring MD:              Medicines:                Propofol per Anesthesia Complications:            No immediate complications. Estimated Blood Loss:     Estimated blood loss was minimal. Procedure:                Pre-Anesthesia Assessment:                           - Prior to the procedure, a History and Physical                            was performed, and patient medications and                            allergies were reviewed. The patient's tolerance of                            previous anesthesia was also reviewed. The risks                            and benefits of the procedure and the sedation                            options and risks were discussed with the patient.                            All questions were answered, and informed consent                            was obtained. Prior Anticoagulants: The patient has                            taken no previous anticoagulant or antiplatelet                            agents. ASA Grade Assessment: III - A patient with                            severe systemic disease. After reviewing the risks  and benefits, the patient was deemed in                            satisfactory condition to undergo the procedure.                           - Sedation was administered by an anesthesia                            professional. Deep sedation was attained.                           After obtaining informed consent, the colonoscope                            was passed under direct  vision. Throughout the                            procedure, the patient's blood pressure, pulse, and                            oxygen saturations were monitored continuously. The                            EC-3490LI ([id number]) scope was introduced through                            the anus and advanced to the the cecum, identified                            by appendiceal orifice and ileocecal valve. The                            colonoscopy was performed with difficulty due to                            significant looping and a tortuous colon.                            Successful completion of the procedure was aided by                            using manual pressure and straightening and                            shortening the scope to obtain bowel loop                            reduction. The patient tolerated the procedure                            well. The quality of the bowel preparation was  good. The ileocecal valve, appendiceal orifice, and                            rectum were photographed. Scope In: 11:18:44 AM Scope Out: 11:47:13 AM Scope Withdrawal Time: 0 hours 14 minutes 0 seconds  Total Procedure Duration: 0 hours 28 minutes 29 seconds  Findings:      Three sessile polyps were found in the proximal descending colon. The       polyps were 3 to 4 mm in size. These polyps were removed with a cold       snare. Resection and retrieval were complete. Impression:               - Three 3 to 4 mm polyps in the proximal descending                            colon, removed with a cold snare. Resected and                            retrieved. Moderate Sedation:      N/A- Per Anesthesia Care Recommendation:           - Patient has a contact number available for                            emergencies. The signs and symptoms of potential                            delayed complications were discussed with the                            patient.  Return to normal activities tomorrow.                            Written discharge instructions were provided to the                            patient.                           - Resume previous diet.                           - Continue present medications.                           - Repeat colonoscopy in 3 - 5 years for                            surveillance. Procedure Code(s):        --- Professional ---                           [phone], Colonoscopy, flexible; with removal of                            tumor(s), polyp(s), or other lesion(s) by snare  technique Diagnosis Code(s):        --- Professional ---                           D12.4, Benign neoplasm of descending colon                           Z12.11, Encounter for screening for malignant                            neoplasm of colon CPT copyright 2017 American Medical Association. All rights reserved. The codes documented in this report are preliminary and upon coder review may  be revised to meet current compliance requirements. Carol Ada, MD Carol Ada, MD 05/04/2017 11:50:09 AM This report has been signed electronically. Number of Addenda: 0

## 2017-05-04 NOTE — H&P (Signed)
  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 Earlean Shawl. At that time she was told that she had GERD, but then she required hospitlization, per her report. It was at that time she was diagnosed with the cancer. Dr. Zella Richer performed a partial gastrectomy and Bilroth II. She underwent treatment with chemotherapy and XRT with Dr. Marin Olp and she continues to follow up with him on an every 6 month basis. Her last colonoscopy was over 10 years ago. She denies any issues with hematochezia, melena, or abdominal pain.  Past Medical History:  Diagnosis Date  . Anxiety   . Arthritis    "right shoulder" (10/09/2013)  . Asthma    pt denies  . B12 deficiency anemia    pt denies  . Fibromyalgia   . GERD (gastroesophageal reflux disease)   . History of blood transfusion 2001   "when I had my stomach cancer"  . Hypertension   . IBS (irritable bowel syndrome)   . Iron deficiency anemia 12/11/2014  . Malabsorption of iron 12/11/2014  . MS (multiple sclerosis) ([place]) dx'd ~ 1998  . Neurogenic bladder   . Osteopenia   . Osteoporosis   . PONV (postoperative nausea and vomiting)   . Stomach cancer ([place]) 2001  . Thyroid nodule    right    Past Surgical History:  Procedure Laterality Date  . BIOPSY THYROID Right 1990's  . [place]; 1984  . JOINT REPLACEMENT    . OMENTECTOMY  2001   partial  . PARTIAL GASTRECTOMY  2001  . TONSILLECTOMY  ~ 1959  . TOTAL HIP ARTHROPLASTY Right 08/18/2014   Procedure: TOTAL HIP ARTHROPLASTY ANTERIOR APPROACH;  Surgeon: Rod Can, MD;  Location: [facility] ORS;  Service: Orthopedics;  Laterality: Right;  . TOTAL SHOULDER ARTHROPLASTY Left 2011    Family History  Problem Relation Age of Onset  . Heart disease Mother   . Diabetes Father   . Stomach cancer Brother   . Multiple sclerosis Other   . Breast cancer Sister   . Breast cancer Maternal Aunt     Social History:  reports that she has never  smoked. She has never used smokeless tobacco. She reports that she does not drink alcohol or use drugs.  Allergies: No Known Allergies  Medications:  Scheduled:  Continuous: . lactated ringers 125 mL/hr at 05/04/17 1031    No results found for this or any previous visit (from the past 24 hour(s)).   No results found.  ROS:  As stated above in the HPI otherwise negative.  Blood pressure (!) 147/54, pulse (!) 58, temperature 98.1 F (36.7 C), temperature source Oral, resp. rate 14, height 5\' 5"  (1.651 m), weight 42.6 kg (94 lb), SpO2 100 %.    PE: Gen: NAD, Alert and Oriented HEENT:  [place]/AT, EOMI Neck: Supple, no LAD Lungs: CTA Bilaterally CV: RRR without M/G/R ABM: Soft, NTND, +BS Ext: No C/C/E  Assessment/Plan: 1) Personal history of gastric adenocarcinoma. 2) Screening colonoscopy.  Plan: 1) EGD/Colonoscopy.  Cortavius Montesinos D 05/04/2017, 10:53 AM

## 2017-05-04 NOTE — Transfer of Care (Signed)
Immediate Anesthesia Transfer of Care Note  Patient: Erika Murphy  Procedure(s) Performed: ESOPHAGOGASTRODUODENOSCOPY (EGD) WITH PROPOFOL (N/A ) COLONOSCOPY WITH PROPOFOL (N/A )  Patient Location: PACU  Anesthesia Type:MAC  Level of Consciousness: sedated  Airway & Oxygen Therapy: Patient Spontanous Breathing and Patient connected to nasal cannula oxygen  Post-op Assessment: Report given to RN and Post -op Vital signs reviewed and stable  Post vital signs: Reviewed and stable  Last Vitals:  Vitals Value Taken Time  BP    Temp    Pulse    Resp    SpO2      Last Pain:  Vitals:   05/04/17 1024  TempSrc: Oral         Complications: No apparent anesthesia complications

## 2017-05-04 NOTE — Anesthesia Postprocedure Evaluation (Signed)
Anesthesia Post Note  Patient: Erika Murphy  Procedure(s) Performed: ESOPHAGOGASTRODUODENOSCOPY (EGD) WITH PROPOFOL (N/A ) COLONOSCOPY WITH PROPOFOL (N/A )     Patient location during evaluation: Endoscopy Anesthesia Type: MAC Level of consciousness: awake and alert Pain management: pain level controlled Vital Signs Assessment: post-procedure vital signs reviewed and stable Respiratory status: spontaneous breathing, nonlabored ventilation, respiratory function stable and patient connected to nasal cannula oxygen Cardiovascular status: stable and blood pressure returned to baseline Postop Assessment: no apparent nausea or vomiting Anesthetic complications: no    Last Vitals:  Vitals:   05/04/17 1200 05/04/17 1210  BP: (!) 127/56 139/64  Pulse: (!) 59 (!) 56  Resp: 17 17  Temp:    SpO2: 100% 99%    Last Pain:  Vitals:   05/04/17 1210  TempSrc:   PainSc: 0-No pain                 Montez Hageman

## 2017-05-04 NOTE — Anesthesia Preprocedure Evaluation (Signed)
Anesthesia Evaluation  Patient identified by MRN, date of birth, ID band Patient awake    Reviewed: Allergy & Precautions, NPO status , Patient's Chart, lab work & pertinent test results  History of Anesthesia Complications (+) PONV  Airway Mallampati: II  TM Distance: >3 FB Neck ROM: Full    Dental no notable dental hx.    Pulmonary neg pulmonary ROS,    Pulmonary exam normal breath sounds clear to auscultation       Cardiovascular hypertension, Pt. on medications Normal cardiovascular exam Rhythm:Regular Rate:Normal     Neuro/Psych negative neurological ROS  negative psych ROS   GI/Hepatic negative GI ROS, Neg liver ROS,   Endo/Other  negative endocrine ROS  Renal/GU negative Renal ROS  negative genitourinary   Musculoskeletal negative musculoskeletal ROS (+) Fibromyalgia -MS   Abdominal   Peds negative pediatric ROS (+)  Hematology negative hematology ROS (+)   Anesthesia Other Findings   Reproductive/Obstetrics negative OB ROS                             Anesthesia Physical Anesthesia Plan  ASA: II  Anesthesia Plan: MAC   Post-op Pain Management:    Induction:   PONV Risk Score and Plan: 3 and Ondansetron and Treatment may vary due to age or medical condition  Airway Management Planned: Nasal Cannula  Additional Equipment:   Intra-op Plan:   Post-operative Plan:   Informed Consent: I have reviewed the patients History and Physical, chart, labs and discussed the procedure including the risks, benefits and alternatives for the proposed anesthesia with the patient or authorized representative who has indicated his/her understanding and acceptance.   Dental advisory given  Plan Discussed with:   Anesthesia Plan Comments:         Anesthesia Quick Evaluation

## 2017-05-04 NOTE — Discharge Instructions (Signed)
Colonoscopy, Adult, Care After °This sheet gives you information about how to care for yourself after your procedure. Your doctor may also give you more specific instructions. If you have problems or questions, call your doctor. °Follow these instructions at home: °General instructions ° °· For the first 24 hours after the procedure: °? Do not drive or use machinery. °? Do not sign important documents. °? Do not drink alcohol. °? Do your daily activities more slowly than normal. °? Eat foods that are soft and easy to digest. °? Rest often. °· Take over-the-counter or prescription medicines only as told by your doctor. °· It is up to you to get the results of your procedure. Ask your doctor, or the department performing the procedure, when your results will be ready. °To help cramping and bloating: °· Try walking around. °· Put heat on your belly (abdomen) as told by your doctor. Use a heat source that your doctor recommends, such as a moist heat pack or a heating pad. °? Put a towel between your skin and the heat source. °? Leave the heat on for 20-30 minutes. °? Remove the heat if your skin turns bright red. This is especially important if you cannot feel pain, heat, or cold. You can get burned. °Eating and drinking °· Drink enough fluid to keep your pee (urine) clear or pale yellow. °· Return to your normal diet as told by your doctor. Avoid heavy or fried foods that are hard to digest. °· Avoid drinking alcohol for as long as told by your doctor. °Contact a doctor if: °· You have blood in your poop (stool) 2-3 days after the procedure. °Get help right away if: °· You have more than a small amount of blood in your poop. °· You see large clumps of tissue (blood clots) in your poop. °· Your belly is swollen. °· You feel sick to your stomach (nauseous). °· You throw up (vomit). °· You have a fever. °· You have belly pain that gets worse, and medicine does not help your pain. °This information is not intended to  replace advice given to you by your health care provider. Make sure you discuss any questions you have with your health care provider. °Document Released: 01/21/2010 Document Revised: 09/13/2015 Document Reviewed: 09/13/2015 °Elsevier Interactive Patient Education © 2017 [employer] °Esophagogastroduodenoscopy, Care After °Refer to this sheet in the next few weeks. These instructions provide you with information about caring for yourself after your procedure. Your health care provider may also give you more specific instructions. Your treatment has been planned according to current medical practices, but problems sometimes occur. Call your health care provider if you have any problems or questions after your procedure. °What can I expect after the procedure? °After the procedure, it is common to have: °· A sore throat. °· Nausea. °· Bloating. °· Dizziness. °· Fatigue. ° °Follow these instructions at home: °· Do not eat or drink anything until the numbing medicine (local anesthetic) has worn off and your gag reflex has returned. You will know that the local anesthetic has worn off when you can swallow comfortably. °· Do not drive for 24 hours if you received a medicine to help you relax (sedative). °· If your health care provider took a tissue sample for testing during the procedure, make sure to get your test results. This is your responsibility. Ask your health care provider or the department performing the test when your results will be ready. °· Keep all follow-up visits as told   by your health care provider. This is important. °Contact a health care provider if: °· You cannot stop coughing. °· You are not urinating. °· You are urinating less than usual. °Get help right away if: °· You have trouble swallowing. °· You cannot eat or drink. °· You have throat or chest pain that gets worse. °· You are dizzy or light-headed. °· You faint. °· You have nausea or vomiting. °· You have chills. °· You have a fever. °· You  have severe abdominal pain. °· You have black, tarry, or bloody stools. °This information is not intended to replace advice given to you by your health care provider. Make sure you discuss any questions you have with your health care provider. °Document Released: 12/06/2011 Document Revised: 05/27/2015 Document Reviewed: 11/12/2014 °Elsevier Interactive Patient Education © 2018 Elsevier Inc. ° °

## 2017-05-07 ENCOUNTER — Encounter (HOSPITAL_COMMUNITY): Payer: Self-pay | Admitting: Gastroenterology

## 2017-05-31 ENCOUNTER — Other Ambulatory Visit: Payer: Self-pay | Admitting: Internal Medicine

## 2017-06-04 DIAGNOSIS — K219 Gastro-esophageal reflux disease without esophagitis: Secondary | ICD-10-CM | POA: Diagnosis not present

## 2017-06-12 ENCOUNTER — Ambulatory Visit (INDEPENDENT_AMBULATORY_CARE_PROVIDER_SITE_OTHER): Payer: Medicare Other | Admitting: Internal Medicine

## 2017-06-12 ENCOUNTER — Encounter: Payer: Self-pay | Admitting: Internal Medicine

## 2017-06-12 VITALS — BP 120/70 | HR 67 | Temp 98.1°F | Ht 65.0 in | Wt 100.0 lb

## 2017-06-12 DIAGNOSIS — H60502 Unspecified acute noninfective otitis externa, left ear: Secondary | ICD-10-CM | POA: Diagnosis not present

## 2017-06-12 DIAGNOSIS — H6502 Acute serous otitis media, left ear: Secondary | ICD-10-CM | POA: Diagnosis not present

## 2017-06-12 MED ORDER — AZITHROMYCIN 250 MG PO TABS: ORAL_TABLET | ORAL | 0 refills | Status: DC

## 2017-06-12 MED ORDER — NEOMYCIN-POLYMYXIN-HC 1 % OT SOLN: 3.0000 [drp] | Freq: Four times a day (QID) | OTIC | 0 refills | Status: DC

## 2017-06-12 NOTE — Progress Notes (Signed)
   Subjective:    Patient ID: Erika Murphy, female    DOB: 1950-02-19, 67 y.o.   MRN: [id number]  HPI Patient complaining of left ear pain.  Has noticed some crusting and itching in her left external ear canal.  Also notices some fullness in left ear.  No fever or chills.  She has a history of multiple sclerosis and currently is not on any treatment for that except for Lyrica.  No recent URI symptoms.  No cough.    Review of Systems See above    Objective:   Physical Exam Skin warm and dry.  Nodes none.  Left TM is full but not red.  Right TM is clear.  Pharynx is clear.  Neck is supple.  There is some slight crusting at the entrance to the left external ear canal.       Assessment & Plan:  Acute left otitis externa   Left serous otitis media  Plan: Cortisporin otic suspension to use 3 drops in the left external ear canal 4 times daily for 5 days.  Zithromax Z-PAK take 2 tablets day 1 followed by 1 tablet days 2 through 5.

## 2017-06-12 NOTE — Patient Instructions (Signed)
Cortisporin otic suspension to use in left external ear canal 3 drops 4 times daily for 5 days.  Zithromax Z-PAK take 2 tablets day 1 followed by 1 tablet days 2 through 5.

## 2017-07-23 ENCOUNTER — Other Ambulatory Visit: Payer: Self-pay | Admitting: Internal Medicine

## 2017-07-24 ENCOUNTER — Other Ambulatory Visit: Payer: Self-pay | Admitting: Internal Medicine

## 2017-07-26 ENCOUNTER — Inpatient Hospital Stay: Payer: Medicare Other | Attending: Hematology & Oncology | Admitting: Hematology & Oncology

## 2017-07-26 ENCOUNTER — Encounter: Payer: Self-pay | Admitting: Hematology & Oncology

## 2017-07-26 ENCOUNTER — Other Ambulatory Visit: Payer: Self-pay

## 2017-07-26 ENCOUNTER — Inpatient Hospital Stay: Payer: Medicare Other

## 2017-07-26 VITALS — BP 115/62 | HR 63 | Temp 97.8°F | Resp 16 | Wt 97.0 lb

## 2017-07-26 DIAGNOSIS — D510 Vitamin B12 deficiency anemia due to intrinsic factor deficiency: Secondary | ICD-10-CM | POA: Diagnosis not present

## 2017-07-26 DIAGNOSIS — Z85028 Personal history of other malignant neoplasm of stomach: Secondary | ICD-10-CM

## 2017-07-26 DIAGNOSIS — E538 Deficiency of other specified B group vitamins: Secondary | ICD-10-CM

## 2017-07-26 DIAGNOSIS — D500 Iron deficiency anemia secondary to blood loss (chronic): Secondary | ICD-10-CM

## 2017-07-26 DIAGNOSIS — D002 Carcinoma in situ of stomach: Secondary | ICD-10-CM

## 2017-07-26 DIAGNOSIS — D509 Iron deficiency anemia, unspecified: Secondary | ICD-10-CM | POA: Diagnosis not present

## 2017-07-26 DIAGNOSIS — G35 Multiple sclerosis: Secondary | ICD-10-CM | POA: Diagnosis not present

## 2017-07-26 DIAGNOSIS — K909 Intestinal malabsorption, unspecified: Secondary | ICD-10-CM

## 2017-07-26 DIAGNOSIS — D508 Other iron deficiency anemias: Secondary | ICD-10-CM

## 2017-07-26 LAB — CBC WITH DIFFERENTIAL (CANCER CENTER ONLY)
BASOS ABS: 0 10*3/uL (ref 0.0–0.1)
Basophils Relative: 1 %
Eosinophils Absolute: 0.2 10*3/uL (ref 0.0–0.5)
Eosinophils Relative: 4 %
HEMATOCRIT: 37.1 % (ref 34.8–46.6)
Hemoglobin: 12.1 g/dL (ref 11.6–15.9)
LYMPHS PCT: 34 %
Lymphs Abs: 1.2 10*3/uL (ref 0.9–3.3)
MCH: 29.8 pg (ref 26.0–34.0)
MCHC: 32.6 g/dL (ref 32.0–36.0)
MCV: 91.4 fL (ref 81.0–101.0)
MONO ABS: 0.3 10*3/uL (ref 0.1–0.9)
Monocytes Relative: 9 %
NEUTROS ABS: 1.9 10*3/uL (ref 1.5–6.5)
Neutrophils Relative %: 52 %
Platelet Count: 177 10*3/uL (ref 145–400)
RBC: 4.06 MIL/uL (ref 3.70–5.32)
RDW: 12.1 % (ref 11.1–15.7)
WBC Count: 3.6 10*3/uL — ABNORMAL LOW (ref 3.9–10.0)

## 2017-07-26 LAB — CMP (CANCER CENTER ONLY)
ALT: 14 U/L (ref 0–44)
AST: 27 U/L (ref 15–41)
Albumin: 4 g/dL (ref 3.5–5.0)
Alkaline Phosphatase: 109 U/L (ref 38–126)
Anion gap: 8 (ref 5–15)
BILIRUBIN TOTAL: 0.9 mg/dL (ref 0.3–1.2)
BUN: 14 mg/dL (ref 8–23)
CALCIUM: 9.8 mg/dL (ref 8.9–10.3)
CO2: 28 mmol/L (ref 22–32)
Chloride: 105 mmol/L (ref 98–111)
Creatinine: 1.23 mg/dL — ABNORMAL HIGH (ref 0.44–1.00)
GFR, EST AFRICAN AMERICAN: 51 mL/min — AB (ref >60)
GFR, EST NON AFRICAN AMERICAN: 44 mL/min — AB (ref >60)
GLUCOSE: 93 mg/dL (ref 70–99)
POTASSIUM: 3.8 mmol/L (ref 3.5–5.1)
Sodium: 141 mmol/L (ref 135–145)
TOTAL PROTEIN: 7.8 g/dL (ref 6.5–8.1)

## 2017-07-26 NOTE — Progress Notes (Signed)
Hematology and Oncology Follow Up Visit  Erika Murphy [id number] 16-Dec-1950 67 y.o. 07/26/2017   Principle Diagnosis:   Stage II (T3N0M0) adenocarcinoma of the stomach-remission Recurrent iron deficiency anemia  Pernicious anemia Multiple sclerosis Current Therapy:    IV iron as indicated  Vitamin B-12 1 mg IM every month-done at home     Interim History:  [name] is back for follow-up.  She comes in every 6 months to see [place].  Everything is going okay for her.  She had upper and lower endoscopy in May.  She had some polyps that were found.  One polyp was in the stomach.  One polyp was in the descending colon.  The polyps were not malignant.  Her brother up in [place] passed away recently.  He had cancer.  He had a for about 3 weeks.  She has had no flareups of the multiple sclerosis.  She has had no bleeding.  There is been no melena or bright red blood per rectum.  She is had no rashes.  There is been no cough or shortness of breath.  Her last iron studies showed a ferritin of 60 with an iron saturation of 37%.  These were back in January 2019.  Overall, I said her performance status is ECOG 2.    Medications:  Current Outpatient Medications:  .  albuterol (PROVENTIL HFA;VENTOLIN HFA) 108 (90 Base) MCG/ACT inhaler, Inhale 2 puffs into the lungs every 6 (six) hours as needed for wheezing or shortness of breath., Disp: 1 Inhaler, Rfl: 12 .  amLODipine (NORVASC) 2.5 MG tablet, TAKE 1 TABLET BY MOUTH EVERY DAY, Disp: 90 tablet, Rfl: 3 .  azithromycin (ZITHROMAX) 250 MG tablet, 2 po day 1 followed by one po days 2-5, Disp: 6 tablet, Rfl: 0 .  cyanocobalamin (,VITAMIN B-12,) 1000 MCG/ML injection, INJECT 1 ML (1,000 MCG TOTAL) INTO THE MUSCLE ONCE A MONTH AS DIRECTED, Disp: 10 mL, Rfl: 0 .  LYRICA 50 MG capsule, TAKE ONE CAPSULE BY MOUTH 3 TIMES A DAY, Disp: 90 capsule, Rfl: 5 .  Multiple Minerals-Vitamins (NUTRA-SUPPORT BONE PO), Take 2 each by mouth daily., Disp: , Rfl:  .   Multiple Vitamins-Minerals (MULTIVITAMIN GUMMIES WOMENS PO), Take 1 each by mouth daily. , Disp: , Rfl:  .  NEOMYCIN-POLYMYXIN-HYDROCORTISONE (CORTISPORIN) 1 % SOLN OTIC solution, Place 3 drops into the left ear 4 (four) times daily., Disp: 10 mL, Rfl: 0 .  pantoprazole (PROTONIX) 40 MG tablet, TAKE 1 TABLET (40 MG TOTAL) BY MOUTH DAILY., Disp: 90 tablet, Rfl: 3  Allergies: No Known Allergies  Past Medical History, Surgical history, Social history, and Family History were reviewed and updated.  Review of Systems: Review of Systems  Constitutional: Positive for malaise/fatigue.  HENT: Positive for tinnitus.   Eyes: Positive for blurred vision.  Respiratory: Negative.   Cardiovascular: Negative.   Gastrointestinal: Positive for diarrhea.  Genitourinary: Negative.   Musculoskeletal: Positive for myalgias.  Skin: Negative.   Neurological: Positive for tingling and weakness.  Endo/Heme/Allergies: Negative.   Psychiatric/Behavioral: Negative.     Physical Exam:  weight is 97 lb (44 kg). Her oral temperature is 97.8 F (36.6 C). Her blood pressure is 115/62 and her pulse is 63. Her respiration is 16 and oxygen saturation is 100%.   Physical Exam  Constitutional: She is oriented to person, place, and time.  HENT:  Head: Normocephalic and atraumatic.  Mouth/Throat: Oropharynx is clear and moist.  Eyes: Pupils are equal, round, and reactive to light. EOM  are normal.  Neck: Normal range of motion.  Cardiovascular: Normal rate, regular rhythm and normal heart sounds.  Pulmonary/Chest: Effort normal and breath sounds normal.  Abdominal: Soft. Bowel sounds are normal.  Musculoskeletal: Normal range of motion. She exhibits no edema, tenderness or deformity.  Lymphadenopathy:    She has no cervical adenopathy.  Neurological: She is alert and oriented to person, place, and time.  Skin: Skin is warm and dry. No rash noted. No erythema.  Psychiatric: She has a normal mood and affect. Her  behavior is normal. Judgment and thought content normal.  Vitals reviewed.    Lab Results  Component Value Date   WBC 3.6 (L) 07/26/2017   HGB 12.1 07/26/2017   HCT 37.1 07/26/2017   MCV 91.4 07/26/2017   PLT 177 07/26/2017     Chemistry      Component Value Date/Time   NA 145 01/18/2017 1205   NA 144 06/12/2016 1151   NA 142 06/11/2015 1156   K 4.1 01/18/2017 1205   K 4.7 06/12/2016 1151   K 4.3 06/11/2015 1156   CL 109 01/18/2017 1205   CL 107 06/12/2016 1151   CO2 31 01/18/2017 1205   CO2 32 06/12/2016 1151   CO2 29 06/11/2015 1156   BUN 17 01/18/2017 1205   BUN 11 06/12/2016 1151   BUN 13.5 06/11/2015 1156   CREATININE 1.18 (H) 01/18/2017 1205   CREATININE 1.2 (H) 06/11/2015 1156      Component Value Date/Time   CALCIUM 9.3 01/18/2017 1205   CALCIUM 9.6 06/12/2016 1151   CALCIUM 9.7 06/11/2015 1156   ALKPHOS 97 (H) 06/12/2016 1151   ALKPHOS 111 06/11/2015 1156   AST 19 01/18/2017 1205   AST 34 06/12/2016 1151   AST 30 06/11/2015 1156   ALT 11 01/18/2017 1205   ALT 26 06/12/2016 1151   ALT 23 06/11/2015 1156   BILITOT 0.6 01/18/2017 1205   BILITOT 1.00 06/12/2016 1151   BILITOT 1.03 06/11/2015 1156      Impression and Plan: Erika Murphy is a 67 year old African [place] female. She had a history of stage II stomach cancer. She underwent resection. She had adjuvant therapy. This is now about 20 years out from treatment. She is cured of the stomach cancer.  It is encouraging that her iron studies have been doing well.  It also is encouraging that her multiple sclerosis is not flared up.  We will go ahead and plan to have her come back in another 6 months.  I am just happy that her quality of life has continued to do well.Marland Kitchen   [name], MD 7/25/201911:21 AM

## 2017-07-27 LAB — IRON AND TIBC
Iron: 88 ug/dL (ref 41–142)
SATURATION RATIOS: 24 % (ref 21–57)
TIBC: 374 ug/dL (ref 236–444)
UIBC: 286 ug/dL

## 2017-07-27 LAB — FERRITIN: FERRITIN: 64 ng/mL (ref 11–307)

## 2017-08-13 ENCOUNTER — Telehealth: Payer: Self-pay | Admitting: Internal Medicine

## 2017-08-13 NOTE — Telephone Encounter (Signed)
Please call in generic Lyrica

## 2017-08-13 NOTE — Telephone Encounter (Signed)
She just found out through a mailed letter that her [employer] is no longer going to cover Lyrica.  She will have to have the generic.  She wants to know if this will be okay for her to do??  She has a little left, but will have to have a refill by the end of the month.    It started the first of August and she hadn't opened her mail because her brother died suddenly and she has been dealing with that death, etc.    If you think she can take the generic, she's fine with trying to do so.  Thank you.    Pharmacy:  [facility]  Phone:  [phone]

## 2017-08-22 MED ORDER — PREGABALIN 50 MG PO CAPS: 50.0000 mg | ORAL_CAPSULE | Freq: Three times a day (TID) | ORAL | 2 refills | Status: DC

## 2017-08-22 NOTE — Telephone Encounter (Signed)
Could you please e-scribe this generic Lyrica in for patient?  Dr. Renold Genta is fine with her taking it.    Thank you so much.

## 2017-08-24 ENCOUNTER — Other Ambulatory Visit: Payer: Self-pay | Admitting: Internal Medicine

## 2017-08-24 DIAGNOSIS — N6320 Unspecified lump in the left breast, unspecified quadrant: Secondary | ICD-10-CM

## 2017-09-06 ENCOUNTER — Telehealth: Payer: Self-pay

## 2017-09-06 NOTE — Telephone Encounter (Signed)
Patient called states at the end of August her LYRICA had to be changed to the generic name pregabalin 50mg  bc her insurance no longer covers the brand name. She has been on pregabalin for a couple of weeks now but she is still in pain she said she has a headache like she hasn't had one before, she can not sleep at night due to the pain in her legs. She would like to know if there is anything else that you can prescribe?

## 2017-09-06 NOTE — Telephone Encounter (Signed)
Pt was notified of instructions, pt verbalized understanding.   

## 2017-09-06 NOTE — Telephone Encounter (Signed)
No she should call neurologist to get nongeneric Lyrica approved for her.

## 2017-09-10 ENCOUNTER — Ambulatory Visit
Admission: RE | Admit: 2017-09-10 | Discharge: 2017-09-10 | Disposition: A | Discharge status: Home / Self Care | Payer: Medicare Other | Source: Ambulatory Visit | Attending: Internal Medicine | Admitting: Internal Medicine

## 2017-09-10 DIAGNOSIS — R922 Inconclusive mammogram: Secondary | ICD-10-CM | POA: Diagnosis not present

## 2017-09-10 DIAGNOSIS — N6002 Solitary cyst of left breast: Secondary | ICD-10-CM | POA: Diagnosis not present

## 2017-09-10 DIAGNOSIS — N6320 Unspecified lump in the left breast, unspecified quadrant: Secondary | ICD-10-CM

## 2017-11-13 DIAGNOSIS — H25013 Cortical age-related cataract, bilateral: Secondary | ICD-10-CM | POA: Diagnosis not present

## 2017-11-13 DIAGNOSIS — H2513 Age-related nuclear cataract, bilateral: Secondary | ICD-10-CM | POA: Diagnosis not present

## 2017-11-13 DIAGNOSIS — H40013 Open angle with borderline findings, low risk, bilateral: Secondary | ICD-10-CM | POA: Diagnosis not present

## 2017-11-13 DIAGNOSIS — H01003 Unspecified blepharitis right eye, unspecified eyelid: Secondary | ICD-10-CM | POA: Diagnosis not present

## 2017-11-26 ENCOUNTER — Ambulatory Visit (INDEPENDENT_AMBULATORY_CARE_PROVIDER_SITE_OTHER): Payer: Medicare Other | Admitting: Internal Medicine

## 2017-11-26 ENCOUNTER — Encounter: Payer: Self-pay | Admitting: Internal Medicine

## 2017-11-26 VITALS — BP 120/70 | HR 72 | Ht 65.0 in | Wt 100.0 lb

## 2017-11-26 DIAGNOSIS — E559 Vitamin D deficiency, unspecified: Secondary | ICD-10-CM | POA: Diagnosis not present

## 2017-11-26 DIAGNOSIS — N319 Neuromuscular dysfunction of bladder, unspecified: Secondary | ICD-10-CM

## 2017-11-26 DIAGNOSIS — G35 Multiple sclerosis: Secondary | ICD-10-CM | POA: Diagnosis not present

## 2017-11-26 DIAGNOSIS — D649 Anemia, unspecified: Secondary | ICD-10-CM

## 2017-11-26 DIAGNOSIS — R269 Unspecified abnormalities of gait and mobility: Secondary | ICD-10-CM | POA: Diagnosis not present

## 2017-11-26 DIAGNOSIS — Z0000 Encounter for general adult medical examination without abnormal findings: Secondary | ICD-10-CM | POA: Diagnosis not present

## 2017-11-26 DIAGNOSIS — Z23 Encounter for immunization: Secondary | ICD-10-CM

## 2017-11-26 DIAGNOSIS — E7800 Pure hypercholesterolemia, unspecified: Secondary | ICD-10-CM

## 2017-11-26 DIAGNOSIS — I10 Essential (primary) hypertension: Secondary | ICD-10-CM | POA: Diagnosis not present

## 2017-11-26 DIAGNOSIS — Z85028 Personal history of other malignant neoplasm of stomach: Secondary | ICD-10-CM

## 2017-11-26 DIAGNOSIS — R413 Other amnesia: Secondary | ICD-10-CM

## 2017-11-26 DIAGNOSIS — E538 Deficiency of other specified B group vitamins: Secondary | ICD-10-CM | POA: Diagnosis not present

## 2017-11-26 DIAGNOSIS — E785 Hyperlipidemia, unspecified: Secondary | ICD-10-CM | POA: Diagnosis not present

## 2017-11-26 DIAGNOSIS — R634 Abnormal weight loss: Secondary | ICD-10-CM

## 2017-11-26 LAB — POCT URINALYSIS DIPSTICK
Appearance: NORMAL
BILIRUBIN UA: NEGATIVE
Blood, UA: NEGATIVE
GLUCOSE UA: NEGATIVE
Ketones, UA: NEGATIVE
Leukocytes, UA: NEGATIVE
NITRITE UA: NEGATIVE
ODOR: NORMAL
PROTEIN UA: NEGATIVE
SPEC GRAV UA: 1.01 (ref 1.010–1.025)
Urobilinogen, UA: 0.2 E.U./dL
pH, UA: 6 (ref 5.0–8.0)

## 2017-11-26 NOTE — Progress Notes (Signed)
Subjective:    Patient ID: Erika Murphy, female    DOB: 1950-07-19, 67 y.o.   MRN: [id number]  HPI 74 year Black Female  for health maintenance exam and evaluation of medical issues.  History of multiple sclerosis it was 67.  Only taking Lyrica at the present.  She has frequent falls.  Says that if I will write an order she may get a medic alert device in case she falls and cannot get up.  Says it will be paid for by [employer].  History of neuropathic pain involving upper and lower extremities and that is why she takes Lyrica.  Had left shoulder arthroplasty by Dr. Veverly Fells in 2011  She has a history of vitamin B12 deficiency and a family member gives her monthly B12 injections.  This is due to gastrectomy which she had due to gastric cancer.  At time she has had some memory issues.  She had an MRI of the brain without contrast in January 2018 and exam was stable compared to 2015.  In 2016 she had right total hip arthroplasty for right femoral neck fracture  Weight is stable at 100 pounds.  History of gastric carcinoma status post surgery which included subtotal gastrectomy and partial omentectomy in 2001.  History of osteopenia  History of H. pylori infection 2006  No known drug allergies  Has distal spastic gait disorder with more distal weakness than proximal weakness.  She is to be followed by Dr. Erling Cruz before he retired.  Dr. Erling Cruz diagnosed her with multiple sclerosis.  She took Betaseron for a while but did not like the way it made her feel.  She just had a recent colonoscopy and endoscopy by Dr. Benson Norway with no evidence for recurrent cancer.  Ambulates with a cane due to gait disorder.  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  She is now disabled.  Does not smoke or consume alcohol.  She is also followed by Dr. Marin Olp, oncologist with history of cancer.  She has never driven a car and always took the  bus to work.  Sometimes has UTIs secondary to neurogenic bladder from multiple sclerosis  Recently had mammogram and tomograms and is concerned. Reviewed report. Follow up recommended in one year.  FHx: one sister died recently s/p leg amputation with hx of diabetes. Sister recently dx with breast cancer. Brother died with metastatic cancer. Review of Systems no new complaints today; mildly anxious about her health     Objective:   Physical Exam  Constitutional: She is oriented to person, place, and time. She appears well-developed and well-nourished. No distress.  HENT:  Head: Normocephalic and atraumatic.  Right Ear: External ear normal.  Left Ear: External ear normal.  Mouth/Throat: Oropharynx is clear and moist. No oropharyngeal exudate.  Eyes: Pupils are equal, round, and reactive to light. Conjunctivae and EOM are normal. Right eye exhibits no discharge. Left eye exhibits no discharge.  Neck: Neck supple. No JVD present. No tracheal deviation present. No thyromegaly present.  Cardiovascular: Normal rate, regular rhythm, normal heart sounds and intact distal pulses.  No murmur heard. Pulmonary/Chest: Effort normal and breath sounds normal. No respiratory distress. She has no wheezes. She has no rales.  Breast fibrocystic changes noted at 12:00 left breast  Abdominal: Soft. Bowel sounds are normal. She exhibits no distension and no mass. There is no tenderness. There is no rebound and no guarding.  Genitourinary:  Genitourinary Comments: Bimanual is normal.  Pap not taken due to age  Lymphadenopathy:    She has no cervical adenopathy.  Neurological: She is alert and oriented to person, place, and time. No cranial nerve deficit. Coordination abnormal.  Skin: Skin is warm and dry. She is not diaphoretic.  Psychiatric: She has a normal mood and affect. Her behavior is normal. Judgment and thought content normal.  Vitals reviewed.         Assessment & Plan:  Multiple sclerosis  with neuropathic pain upper and lower extremities treated with Lyrica  History of vitamin B12 deficiency secondary to gastrectomy and receives B12 shots monthly  Status post right hip arthroplasty associated with right femoral neck fracture 2016  History of mild elevation of LDL  History of osteopenia  History of H. pylori infection 2006  Frequent falls secondary to gait disorder-prescription given for medic alert device  History of UTIs likely due to neurogenic bladder from MS  Essential hypertension-on treatment  History of iron deficiency secondary to gastrectomy  History of anxiety  Flu vaccine given  Plan: Follow-up in 6 month  with office visit weight check and B12 level as well as iron and iron binding capacity.  Fasting labs drawn and are pending with further instructions to follow.  Add B12 level.  Subjective:   Patient presents for Medicare Annual/Subsequent preventive examination.  Review Past Medical/Family/Social: See above   Risk Factors  Current exercise habits: Sedentary due to MS issues Dietary issues discussed: Encourage protein and calorie consumption  Cardiac risk factors: Mild hyperlipidemia and family history of mother  Depression Screen  (Note: if answer to either of the following is "Yes", a more complete depression screening is indicated)   Over the past two weeks, have you felt down, depressed or hopeless? No  Over the past two weeks, have you felt little interest or pleasure in doing things? No Have you lost interest or pleasure in daily life? No Do you often feel hopeless? No Do you cry easily over simple problems? No   Activities of Daily Living  In your present state of health, do you have any difficulty performing the following activities?:   Driving?  Does not drive Managing money?  Sometimes Feeding yourself?  Preparing food is a problem Getting from bed to chair?  Sometimes Climbing a flight of stairs?  Yes due to gait  disturbance from [name] Preparing food and eating?:  Yes Bathing or showering?  Yes can be fatigued Getting dressed: Yes get fatigued Getting to the toilet?  Yes due to issues with ambulation Using the toilet:No  Moving around from place to place: Yes due to gait disorder from multiple sclerosis In the past year have you fallen or had a near fall?  He has several falls Are you sexually active? No  Do you have more than one partner? No   Hearing Difficulties: No  Do you often ask people to speak up or repeat themselves? No  Do you experience ringing or noises in your ears?  Sometimes Do you have difficulty understanding soft or whispered voices? No  Do you feel that you have a problem with memory? No Do you often misplace items? No    Home Safety:  Do you have a smoke alarm at your residence? Yes Do you have grab bars in the bathroom?  Yes Do you have throw rugs in your house?  No   Cognitive Testing  Alert? Yes Normal Appearance?Yes  Oriented to person? Yes Place?  Yes  Time? Yes  Recall of three objects?  Not tested Can perform simple calculations?  Not tested Displays appropriate judgment?Yes  Can read the correct time from a watch face?Yes   List the Names of Other Physician/Practitioners you currently use:  See referral list for the physicians patient is currently seeing.  Dr. Marin Olp  Dr. Tarri [facility]   Review of Systems: See above  Objective:     General appearance: Appears younger than stated age and frail Head: Normocephalic, without obvious abnormality, atraumatic  Eyes: conj clear, EOMi PEERLA  Ears: normal TM's and external ear canals both ears  Nose: Nares normal. Septum midline. Mucosa normal. No drainage or sinus tenderness.  Throat: lips, mucosa, and tongue normal; teeth and gums normal  Neck: no adenopathy, no carotid bruit, no JVD, supple, symmetrical, trachea midline and thyroid not enlarged, symmetric, no tenderness/mass/nodules  No CVA  tenderness.  Lungs: clear to auscultation bilaterally  Breasts: normal appearance Heart: regular rate and rhythm, S1, S2 normal, no murmur, click, rub or gallop  Abdomen: soft, non-tender; bowel sounds normal; no masses, no organomegaly  Musculoskeletal: ROM normal in all joints, no crepitus, no deformity, Normal muscle strengthen. Back  is symmetric, no curvature. Skin: Skin color, texture, turgor normal. No rashes or lesions  Lymph nodes: Cervical, supraclavicular, and axillary nodes normal.  Neurologic: CN 2 -12 Normal, Normal symmetric reflexes.  Gait disturbance ambulates with a cane Psych: Alert & Oriented x 3, Mood appear stable.    Assessment:    Annual wellness medicare exam   Plan:    During the course of the visit the patient was educated and counseled about appropriate screening and preventive services including:   Annual mammogram  Annual flu vaccine     Patient Instructions (the written plan) was given to the patient.  Medicare Attestation  I have personally reviewed:  The patient's medical and social history  Their use of alcohol, tobacco or illicit drugs  Their current medications and supplements  The patient's functional ability including ADLs,fall risks, home safety risks, cognitive, and hearing and visual impairment  Diet and physical activities  Evidence for depression or mood disorders  The patient's weight, height, BMI, and visual acuity have been recorded in the chart. I have made referrals, counseling, and provided education to the patient based on review of the above and I have provided the patient with a written personalized care plan for preventive services.

## 2017-11-26 NOTE — Patient Instructions (Signed)
It was a pleasure to see you today.  Prescription written for medical alert device for frequent falls.  Continue Lyrica.  Return in 6 months.  Continue monthly B12 injections.  Try to get enough calories and protein.

## 2017-11-27 ENCOUNTER — Other Ambulatory Visit: Payer: Self-pay | Admitting: Internal Medicine

## 2017-11-27 DIAGNOSIS — E538 Deficiency of other specified B group vitamins: Secondary | ICD-10-CM

## 2017-11-27 DIAGNOSIS — K909 Intestinal malabsorption, unspecified: Secondary | ICD-10-CM

## 2017-11-27 DIAGNOSIS — M8580 Other specified disorders of bone density and structure, unspecified site: Secondary | ICD-10-CM

## 2017-11-27 DIAGNOSIS — Z903 Acquired absence of stomach [part of]: Secondary | ICD-10-CM

## 2017-11-27 LAB — CBC WITH DIFFERENTIAL/PLATELET
BASOS ABS: 21 {cells}/uL (ref 0–200)
Basophils Relative: 0.4 %
EOS ABS: 73 {cells}/uL (ref 15–500)
EOS PCT: 1.4 %
HEMATOCRIT: 36.3 % (ref 35.0–45.0)
HEMOGLOBIN: 12.3 g/dL (ref 11.7–15.5)
Lymphs Abs: 858 cells/uL (ref 850–3900)
MCH: 29.8 pg (ref 27.0–33.0)
MCHC: 33.9 g/dL (ref 32.0–36.0)
MCV: 87.9 fL (ref 80.0–100.0)
MPV: 11.3 fL (ref 7.5–12.5)
Monocytes Relative: 8.2 %
NEUTROS ABS: 3822 {cells}/uL (ref 1500–7800)
NEUTROS PCT: 73.5 %
Platelets: 164 10*3/uL (ref 140–400)
RBC: 4.13 10*6/uL (ref 3.80–5.10)
RDW: 11.9 % (ref 11.0–15.0)
Total Lymphocyte: 16.5 %
WBC: 5.2 10*3/uL (ref 3.8–10.8)
WBCMIX: 426 {cells}/uL (ref 200–950)

## 2017-11-27 LAB — COMPLETE METABOLIC PANEL WITH GFR
AG RATIO: 1.3 (calc) (ref 1.0–2.5)
ALBUMIN MSPROF: 4.1 g/dL (ref 3.6–5.1)
ALT: 10 U/L (ref 6–29)
AST: 20 U/L (ref 10–35)
Alkaline phosphatase (APISO): 112 U/L (ref 33–130)
BUN/Creatinine Ratio: 13 (calc) (ref 6–22)
BUN: 13 mg/dL (ref 7–25)
CALCIUM: 9.5 mg/dL (ref 8.6–10.4)
CO2: 24 mmol/L (ref 20–32)
Chloride: 104 mmol/L (ref 98–110)
Creat: 1.01 mg/dL — ABNORMAL HIGH (ref 0.50–0.99)
GFR, EST NON AFRICAN AMERICAN: 57 mL/min/{1.73_m2} — AB (ref >=60)
GFR, Est African American: 66 mL/min/{1.73_m2} (ref >=60)
GLOBULIN: 3.1 g/dL (ref 1.9–3.7)
Glucose, Bld: 84 mg/dL (ref 65–99)
POTASSIUM: 4.1 mmol/L (ref 3.5–5.3)
SODIUM: 140 mmol/L (ref 135–146)
TOTAL PROTEIN: 7.2 g/dL (ref 6.1–8.1)
Total Bilirubin: 1 mg/dL (ref 0.2–1.2)

## 2017-11-27 LAB — TSH: TSH: 0.92 mIU/L (ref 0.40–4.50)

## 2017-11-27 LAB — LIPID PANEL
Cholesterol: 219 mg/dL — ABNORMAL HIGH (ref <200)
HDL: 59 mg/dL (ref >50)
LDL Cholesterol (Calc): 139 mg/dL (calc) — ABNORMAL HIGH
NON-HDL CHOLESTEROL (CALC): 160 mg/dL — AB (ref <130)
TRIGLYCERIDES: 97 mg/dL (ref <150)
Total CHOL/HDL Ratio: 3.7 (calc) (ref <5.0)

## 2017-11-27 LAB — TEST AUTHORIZATION

## 2017-11-27 LAB — PREALBUMIN: Prealbumin: 20 mg/dL (ref 17–34)

## 2017-11-27 LAB — VITAMIN B12: Vitamin B-12: 472 pg/mL (ref 200–1100)

## 2018-01-24 ENCOUNTER — Encounter: Payer: Self-pay | Admitting: Hematology & Oncology

## 2018-01-24 ENCOUNTER — Inpatient Hospital Stay: Payer: Medicare Other

## 2018-01-24 ENCOUNTER — Inpatient Hospital Stay: Payer: Medicare Other | Attending: Hematology & Oncology | Admitting: Hematology & Oncology

## 2018-01-24 ENCOUNTER — Other Ambulatory Visit: Payer: Self-pay

## 2018-01-24 VITALS — BP 140/88 | HR 64 | Temp 98.3°F | Wt 99.0 lb

## 2018-01-24 DIAGNOSIS — Z85028 Personal history of other malignant neoplasm of stomach: Secondary | ICD-10-CM

## 2018-01-24 DIAGNOSIS — E538 Deficiency of other specified B group vitamins: Secondary | ICD-10-CM

## 2018-01-24 DIAGNOSIS — R109 Unspecified abdominal pain: Secondary | ICD-10-CM | POA: Diagnosis not present

## 2018-01-24 DIAGNOSIS — G35 Multiple sclerosis: Secondary | ICD-10-CM | POA: Diagnosis not present

## 2018-01-24 DIAGNOSIS — D500 Iron deficiency anemia secondary to blood loss (chronic): Secondary | ICD-10-CM

## 2018-01-24 DIAGNOSIS — Z79899 Other long term (current) drug therapy: Secondary | ICD-10-CM | POA: Diagnosis not present

## 2018-01-24 DIAGNOSIS — K909 Intestinal malabsorption, unspecified: Secondary | ICD-10-CM

## 2018-01-24 DIAGNOSIS — D6489 Other specified anemias: Secondary | ICD-10-CM | POA: Diagnosis not present

## 2018-01-24 DIAGNOSIS — D002 Carcinoma in situ of stomach: Secondary | ICD-10-CM

## 2018-01-24 LAB — CMP (CANCER CENTER ONLY)
ALT: 13 U/L (ref 0–44)
AST: 22 U/L (ref 15–41)
Albumin: 4.3 g/dL (ref 3.5–5.0)
Alkaline Phosphatase: 99 U/L (ref 38–126)
Anion gap: 5 (ref 5–15)
BUN: 13 mg/dL (ref 8–23)
CO2: 32 mmol/L (ref 22–32)
Calcium: 9.9 mg/dL (ref 8.9–10.3)
Chloride: 106 mmol/L (ref 98–111)
Creatinine: 1 mg/dL (ref 0.44–1.00)
GFR, Est AFR Am: >60 mL/min
GFR, Estimated: 57 mL/min — ABNORMAL LOW
Glucose, Bld: 78 mg/dL (ref 70–99)
Potassium: 4 mmol/L (ref 3.5–5.1)
Sodium: 143 mmol/L (ref 135–145)
Total Bilirubin: 0.8 mg/dL (ref 0.3–1.2)
Total Protein: 7.2 g/dL (ref 6.5–8.1)

## 2018-01-24 LAB — CBC WITH DIFFERENTIAL (CANCER CENTER ONLY)
Abs Immature Granulocytes: 0.01 10*3/uL (ref 0.00–0.07)
Basophils Absolute: 0 10*3/uL (ref 0.0–0.1)
Basophils Relative: 0 %
Eosinophils Absolute: 0.1 10*3/uL (ref 0.0–0.5)
Eosinophils Relative: 3 %
HCT: 37.8 % (ref 36.0–46.0)
Hemoglobin: 12.1 g/dL (ref 12.0–15.0)
Immature Granulocytes: 0 %
LYMPHS ABS: 0.9 10*3/uL (ref 0.7–4.0)
LYMPHS PCT: 34 %
MCH: 29.7 pg (ref 26.0–34.0)
MCHC: 32 g/dL (ref 30.0–36.0)
MCV: 92.6 fL (ref 80.0–100.0)
Monocytes Absolute: 0.3 10*3/uL (ref 0.1–1.0)
Monocytes Relative: 13 %
NRBC: 0 % (ref 0.0–0.2)
Neutro Abs: 1.3 10*3/uL — ABNORMAL LOW (ref 1.7–7.7)
Neutrophils Relative %: 50 %
Platelet Count: 182 10*3/uL (ref 150–400)
RBC: 4.08 MIL/uL (ref 3.87–5.11)
RDW: 12.2 % (ref 11.5–15.5)
WBC: 2.5 10*3/uL — AB (ref 4.0–10.5)

## 2018-01-24 MED ORDER — CYANOCOBALAMIN 1000 MCG/ML IJ SOLN
INTRAMUSCULAR | Status: AC
  Filled 2018-01-24: qty 1

## 2018-01-24 MED ORDER — CYANOCOBALAMIN 1000 MCG/ML IJ SOLN
1000.0000 ug | Freq: Once | INTRAMUSCULAR | Status: AC
  Administered 2018-01-24: 1000 ug via INTRAMUSCULAR

## 2018-01-24 NOTE — Progress Notes (Signed)
Hematology and Oncology Follow Up Visit  MIKISHA ROSELAND [id number] [birth year] 68 y.o. 01/24/2018   Principle Diagnosis:   Stage II (T3N0M0) adenocarcinoma of the stomach-remission Recurrent iron deficiency anemia  Pernicious anemia Multiple sclerosis Current Therapy:    IV iron as indicated  Vitamin B-12 1 mg IM every month     Interim History:  Ms.  Newburn is back for follow-up.  She is having some problems right now.  She is having some abdominal pain.  This is been going on for about a month.  She is not having any vomiting.  There is some nausea.  There is been no diarrhea.  There is been no constipation.  She has had no fever.  We probably need to get her set up with a CT scan.  I realize that it is been under quite a long time since she had the stomach cancer.  I suspect she might have scar tissue.  I do not think anything is related to the multiple sclerosis.  She has not taken her vitamin B12 as she normally has.  She ran out of it at home.  She is not had a refill for this.  We will go ahead and give her a dose of vitamin B12 today.  Her iron studies that we did on her back in July 2019 showed a ferritin of 64 with an iron saturation of 24%.  She had a very nice family cruise last summer.  She and her family went to the [place].    Overall, I said her performance status is ECOG 2.    Medications:  Current Outpatient Medications:  .  albuterol (PROVENTIL HFA;VENTOLIN HFA) 108 (90 Base) MCG/ACT inhaler, Inhale 2 puffs into the lungs every 6 (six) hours as needed for wheezing or shortness of breath., Disp: 1 Inhaler, Rfl: 12 .  amLODipine (NORVASC) 2.5 MG tablet, TAKE 1 TABLET BY MOUTH EVERY DAY, Disp: 90 tablet, Rfl: 3 .  cyanocobalamin (,VITAMIN B-12,) 1000 MCG/ML injection, INJECT 1 ML (1,000 MCG TOTAL) INTO THE MUSCLE ONCE A MONTH AS DIRECTED, Disp: 10 mL, Rfl: 0 .  Multiple Minerals-Vitamins (NUTRA-SUPPORT BONE PO), Take 2 each by mouth daily., Disp: , Rfl:   .  Multiple Vitamins-Minerals (MULTIVITAMIN GUMMIES WOMENS PO), Take 1 each by mouth daily. , Disp: , Rfl:  .  NEOMYCIN-POLYMYXIN-HYDROCORTISONE (CORTISPORIN) 1 % SOLN OTIC solution, Place 3 drops into the left ear 4 (four) times daily., Disp: 10 mL, Rfl: 0 .  pantoprazole (PROTONIX) 40 MG tablet, TAKE 1 TABLET (40 MG TOTAL) BY MOUTH DAILY., Disp: 90 tablet, Rfl: 3 .  pregabalin (LYRICA) 50 MG capsule, Take 1 capsule (50 mg total) by mouth 3 (three) times daily., Disp: 90 capsule, Rfl: 2  Allergies: No Known Allergies  Past Medical History, Surgical history, Social history, and Family History were reviewed and updated.  Review of Systems: Review of Systems  Constitutional: Positive for malaise/fatigue.  HENT: Positive for tinnitus.   Eyes: Positive for blurred vision.  Respiratory: Negative.   Cardiovascular: Negative.   Gastrointestinal: Positive for diarrhea.  Genitourinary: Negative.   Musculoskeletal: Positive for myalgias.  Skin: Negative.   Neurological: Positive for tingling and weakness.  Endo/Heme/Allergies: Negative.   Psychiatric/Behavioral: Negative.     Physical Exam:  weight is 99 lb (44.9 kg). Her oral temperature is 98.3 F (36.8 C). Her blood pressure is 140/88 and her pulse is 64. Her oxygen saturation is 100%.   Physical Exam Vitals signs reviewed.  HENT:  Head: Normocephalic and atraumatic.  Eyes:     Pupils: Pupils are equal, round, and reactive to light.  Neck:     Musculoskeletal: Normal range of motion.  Cardiovascular:     Rate and Rhythm: Normal rate and regular rhythm.     Heart sounds: Normal heart sounds.  Pulmonary:     Effort: Pulmonary effort is normal.     Breath sounds: Normal breath sounds.  Abdominal:     General: Bowel sounds are normal.     Palpations: Abdomen is soft.  Musculoskeletal: Normal range of motion.        General: No tenderness or deformity.  Lymphadenopathy:     Cervical: No cervical adenopathy.  Skin:     General: Skin is warm and dry.     Findings: No erythema or rash.  Neurological:     Mental Status: She is alert and oriented to person, place, and time.  Psychiatric:        Behavior: Behavior normal.        Thought Content: Thought content normal.        Judgment: Judgment normal.      Lab Results  Component Value Date   WBC 2.5 (L) 01/24/2018   HGB 12.1 01/24/2018   HCT 37.8 01/24/2018   MCV 92.6 01/24/2018   PLT 182 01/24/2018     Chemistry      Component Value Date/Time   NA 143 01/24/2018 1115   NA 144 06/12/2016 1151   NA 142 06/11/2015 1156   K 4.0 01/24/2018 1115   K 4.7 06/12/2016 1151   K 4.3 06/11/2015 1156   CL 106 01/24/2018 1115   CL 107 06/12/2016 1151   CO2 32 01/24/2018 1115   CO2 32 06/12/2016 1151   CO2 29 06/11/2015 1156   BUN 13 01/24/2018 1115   BUN 11 06/12/2016 1151   BUN 13.5 06/11/2015 1156   CREATININE 1.00 01/24/2018 1115   CREATININE 1.01 (H) 11/26/2017 1149   CREATININE 1.2 (H) 06/11/2015 1156      Component Value Date/Time   CALCIUM 9.9 01/24/2018 1115   CALCIUM 9.6 06/12/2016 1151   CALCIUM 9.7 06/11/2015 1156   ALKPHOS 99 01/24/2018 1115   ALKPHOS 97 (H) 06/12/2016 1151   ALKPHOS 111 06/11/2015 1156   AST 22 01/24/2018 1115   AST 30 06/11/2015 1156   ALT 13 01/24/2018 1115   ALT 26 06/12/2016 1151   ALT 23 06/11/2015 1156   BILITOT 0.8 01/24/2018 1115   BILITOT 1.03 06/11/2015 1156      Impression and Plan: Ms. Kreischer is a 68 year old African [place] female. She had a history of stage II stomach cancer. She underwent resection. She had adjuvant therapy. This is now about 20 years out from treatment. She is cured of the stomach cancer.  Again, I am somewhat concerned about this abdominal pain that she is having.  We will set her up with a scan.  If all looks good, we will plan to get her back to see [place] in another 6 months.  ..   [name], MD 1/23/202012:52 PM

## 2018-01-24 NOTE — Patient Instructions (Signed)
Cyanocobalamin, Vitamin B12 injection What is this medicine? CYANOCOBALAMIN (sye an oh koe BAL a min) is a man made form of vitamin B12. Vitamin B12 is used in the growth of healthy blood cells, nerve cells, and proteins in the body. It also helps with the metabolism of fats and carbohydrates. This medicine is used to treat people who can not absorb vitamin B12. This medicine may be used for other purposes; ask your health care provider or pharmacist if you have questions. COMMON BRAND NAME(S): B-12 Compliance Kit, B-12 Injection Kit, Cyomin, LA-12, Nutri-Twelve, Physicians EZ Use B-12, Primabalt What should I tell my health care provider before I take this medicine? They need to know if you have any of these conditions: -kidney disease -Leber's disease -megaloblastic anemia -an unusual or allergic reaction to cyanocobalamin, cobalt, other medicines, foods, dyes, or preservatives -pregnant or trying to get pregnant -breast-feeding How should I use this medicine? This medicine is injected into a muscle or deeply under the skin. It is usually given by a health care professional in a clinic or doctor's office. However, your doctor may teach you how to inject yourself. Follow all instructions. Talk to your pediatrician regarding the use of this medicine in children. Special care may be needed. Overdosage: If you think you have taken too much of this medicine contact a poison control center or emergency room at once. NOTE: This medicine is only for you. Do not share this medicine with others. What if I miss a dose? If you are given your dose at a clinic or doctor's office, call to reschedule your appointment. If you give your own injections and you miss a dose, take it as soon as you can. If it is almost time for your next dose, take only that dose. Do not take double or extra doses. What may interact with this medicine? -colchicine -heavy alcohol intake This list may not describe all possible  interactions. Give your health care provider a list of all the medicines, herbs, non-prescription drugs, or dietary supplements you use. Also tell them if you smoke, drink alcohol, or use illegal drugs. Some items may interact with your medicine. What should I watch for while using this medicine? Visit your doctor or health care professional regularly. You may need blood work done while you are taking this medicine. You may need to follow a special diet. Talk to your doctor. Limit your alcohol intake and avoid smoking to get the best benefit. What side effects may I notice from receiving this medicine? Side effects that you should report to your doctor or health care professional as soon as possible: -allergic reactions like skin rash, itching or hives, swelling of the face, lips, or tongue -blue tint to skin -chest tightness, pain -difficulty breathing, wheezing -dizziness -red, swollen painful area on the leg Side effects that usually do not require medical attention (report to your doctor or health care professional if they continue or are bothersome): -diarrhea -headache This list may not describe all possible side effects. Call your doctor for medical advice about side effects. You may report side effects to FDA at 1-800-FDA-1088. Where should I keep my medicine? Keep out of the reach of children. Store at room temperature between 15 and 30 degrees C (59 and 85 degrees F). Protect from light. Throw away any unused medicine after the expiration date. NOTE: This sheet is a summary. It may not cover all possible information. If you have questions about this medicine, talk to your doctor, pharmacist, or  health care provider.  2019 Elsevier/Gold Standard (2007-04-01 22:10:20)  

## 2018-01-25 LAB — IRON AND TIBC
IRON: 108 ug/dL (ref 41–142)
SATURATION RATIOS: 28 % (ref 21–57)
TIBC: 385 ug/dL (ref 236–444)
UIBC: 278 ug/dL (ref 120–384)

## 2018-01-25 LAB — FERRITIN: Ferritin: 29 ng/mL (ref 11–307)

## 2018-01-28 ENCOUNTER — Telehealth: Payer: Self-pay | Admitting: Internal Medicine

## 2018-01-28 ENCOUNTER — Telehealth: Payer: Self-pay | Admitting: *Deleted

## 2018-01-28 NOTE — Telephone Encounter (Signed)
What part of the leg needs to be Xrayed?

## 2018-01-28 NOTE — Telephone Encounter (Signed)
Patient has been seeing Dr. Marin Olp in the [facility] office due to bad stomach pains.  He is ordering a CT Scan for her on Wednesday.  She is wanting to know if it would be possible to have her leg x-rayed at the same time she has this CT scan done because she has been having significant pain in that leg over the last several weeks.  She is having to stay with her Daughter in [place] at the present and her friend is coming to pick her up on Wednesday morning to bring her to [facility] for the CT Scan.  So, she wanted to know if you would kindly write an order for her to have this leg x-rayed.   She did not mention any recent falls, only the terrible belly pain that has been going on for a while now.    Cell phone best # for contact.

## 2018-01-28 NOTE — Telephone Encounter (Signed)
-----   Message from [name], MD sent at 01/28/2018  7:00 AM EST ----- Call - iron level is ok!!!  Erika Murphy

## 2018-01-28 NOTE — Telephone Encounter (Signed)
Patient notified per order of Dr. Ennever that iron level is ok.  Patient appreciative of call and has no questions at this time.  

## 2018-01-29 NOTE — Telephone Encounter (Signed)
Spoke with patient who states it is the right leg (same side of the hip replacement).  States that it oft times hurts from the hip all the way down to her ankle.  She feels at times that her ankle is swollen from it.  She hasn't been on any pain meds other than Ibuprofen.  States that sometimes the pain/pressure is so bad that she can hardly put pressure on that leg to walk.    I told her this is the reason that you need to see her in the office to get a better feel of where the worst pain is at.  And, explained there are so many types of x-rays of the leg to order; not just a simple leg x-ray.  I asked her if she thought Dr. Marin Olp would order this for her since she is having the CT Scan tomorrow.  She said she had not mentioned the pain of the leg to him because her stomach pain was so excruciating at the time.    If Dr. Marin Olp will not order it for her, she will call back and make an appointment to see you one day next week.

## 2018-01-29 NOTE — Telephone Encounter (Signed)
Notified patient that if Dr. Marin Olp doesn't order the x-ray for her, please contact Dr. Noralyn Pick office for an appointment.

## 2018-01-29 NOTE — Telephone Encounter (Signed)
Dr. Delfino Lovett at [facility] did her surgery

## 2018-01-30 ENCOUNTER — Telehealth: Payer: Self-pay | Admitting: *Deleted

## 2018-01-30 ENCOUNTER — Ambulatory Visit (HOSPITAL_BASED_OUTPATIENT_CLINIC_OR_DEPARTMENT_OTHER)
Admission: RE | Admit: 2018-01-30 | Discharge: 2018-01-30 | Disposition: A | Discharge status: Home / Self Care | Payer: Medicare Other | Source: Ambulatory Visit | Attending: Hematology & Oncology | Admitting: Hematology & Oncology

## 2018-01-30 ENCOUNTER — Encounter (HOSPITAL_BASED_OUTPATIENT_CLINIC_OR_DEPARTMENT_OTHER): Payer: Self-pay

## 2018-01-30 DIAGNOSIS — N281 Cyst of kidney, acquired: Secondary | ICD-10-CM | POA: Diagnosis not present

## 2018-01-30 DIAGNOSIS — D002 Carcinoma in situ of stomach: Secondary | ICD-10-CM | POA: Diagnosis not present

## 2018-01-30 DIAGNOSIS — K909 Intestinal malabsorption, unspecified: Secondary | ICD-10-CM | POA: Insufficient documentation

## 2018-01-30 MED ORDER — IOPAMIDOL (ISOVUE-300) INJECTION 61%
100.0000 mL | Freq: Once | INTRAVENOUS | Status: AC | PRN
  Administered 2018-01-30: 100 mL via INTRAVENOUS

## 2018-01-30 NOTE — Telephone Encounter (Signed)
-----   Message from [name], MD sent at 01/30/2018  1:31 PM EST ----- Call - NO cancer in the abdomen!!!  Erika Murphy

## 2018-01-30 NOTE — Telephone Encounter (Signed)
As noted below by Dr. Marin Olp, I informed the patient that there is NO cancer in the abdomen. She verbalized understanding.

## 2018-02-20 ENCOUNTER — Other Ambulatory Visit: Payer: Self-pay | Admitting: Internal Medicine

## 2018-03-27 ENCOUNTER — Telehealth: Payer: Self-pay | Admitting: Internal Medicine

## 2018-03-27 NOTE — Telephone Encounter (Signed)
Please fix this note for her. Pt has multiple sclerosis and needs assistance.

## 2018-03-27 NOTE — Telephone Encounter (Signed)
Derrick Orris [phone]  Erika Murphy called say her daughter wanted her to call and see if she could possible get a note faxed to her daughter stating that her daughter would need to come out and check on her due to her medical condition, if we go into a lock-down.

## 2018-03-28 ENCOUNTER — Encounter: Payer: Self-pay | Admitting: Internal Medicine

## 2018-03-28 NOTE — Telephone Encounter (Signed)
Called [place]a to let her know that the note is done and in her MyChart and I would also mail the original to her.

## 2018-05-07 ENCOUNTER — Telehealth: Payer: Self-pay | Admitting: Internal Medicine

## 2018-05-07 ENCOUNTER — Ambulatory Visit (INDEPENDENT_AMBULATORY_CARE_PROVIDER_SITE_OTHER): Payer: Medicare Other | Admitting: Internal Medicine

## 2018-05-07 ENCOUNTER — Encounter: Payer: Self-pay | Admitting: Internal Medicine

## 2018-05-07 VITALS — Ht 65.0 in | Wt 90.0 lb

## 2018-05-07 DIAGNOSIS — Z903 Acquired absence of stomach [part of]: Secondary | ICD-10-CM

## 2018-05-07 DIAGNOSIS — R1030 Lower abdominal pain, unspecified: Secondary | ICD-10-CM | POA: Diagnosis not present

## 2018-05-07 NOTE — Telephone Encounter (Signed)
Set up virtual visit 

## 2018-05-07 NOTE — Telephone Encounter (Signed)
[name] [phone]  Maggie called to say she is having a lot of pain in her stomach and unable to eat very much. When she does eat it has to be something very light. This started when she had a sharp pain run up her leg into her female area, no nausea, no fever.

## 2018-05-07 NOTE — Telephone Encounter (Signed)
Scheduled Virtual Visit °

## 2018-05-08 ENCOUNTER — Ambulatory Visit
Admission: RE | Admit: 2018-05-08 | Discharge: 2018-05-08 | Disposition: A | Discharge status: Home / Self Care | Payer: Medicare Other | Source: Ambulatory Visit | Attending: Internal Medicine | Admitting: Internal Medicine

## 2018-05-08 ENCOUNTER — Other Ambulatory Visit: Payer: Self-pay

## 2018-05-08 DIAGNOSIS — R109 Unspecified abdominal pain: Secondary | ICD-10-CM | POA: Diagnosis not present

## 2018-05-09 DIAGNOSIS — R1013 Epigastric pain: Secondary | ICD-10-CM

## 2018-05-10 ENCOUNTER — Other Ambulatory Visit: Payer: Self-pay

## 2018-05-10 MED ORDER — ESOMEPRAZOLE MAGNESIUM 40 MG PO CPDR: 40.0000 mg | DELAYED_RELEASE_CAPSULE | Freq: Every day | ORAL | 0 refills | Status: DC

## 2018-05-21 ENCOUNTER — Encounter: Payer: Self-pay | Admitting: Internal Medicine

## 2018-05-21 ENCOUNTER — Other Ambulatory Visit: Payer: Self-pay

## 2018-05-21 ENCOUNTER — Ambulatory Visit (INDEPENDENT_AMBULATORY_CARE_PROVIDER_SITE_OTHER): Payer: Medicare Other | Admitting: Internal Medicine

## 2018-05-21 VITALS — BP 120/80 | HR 57 | Temp 98.4°F | Wt 100.0 lb

## 2018-05-21 DIAGNOSIS — K909 Intestinal malabsorption, unspecified: Secondary | ICD-10-CM | POA: Diagnosis not present

## 2018-05-21 DIAGNOSIS — Z85028 Personal history of other malignant neoplasm of stomach: Secondary | ICD-10-CM

## 2018-05-21 DIAGNOSIS — E538 Deficiency of other specified B group vitamins: Secondary | ICD-10-CM

## 2018-05-21 DIAGNOSIS — R300 Dysuria: Secondary | ICD-10-CM

## 2018-05-21 DIAGNOSIS — I10 Essential (primary) hypertension: Secondary | ICD-10-CM

## 2018-05-21 DIAGNOSIS — Z903 Acquired absence of stomach [part of]: Secondary | ICD-10-CM | POA: Diagnosis not present

## 2018-05-21 DIAGNOSIS — R1013 Epigastric pain: Secondary | ICD-10-CM | POA: Diagnosis not present

## 2018-05-21 DIAGNOSIS — D500 Iron deficiency anemia secondary to blood loss (chronic): Secondary | ICD-10-CM | POA: Diagnosis not present

## 2018-05-21 LAB — POCT URINALYSIS DIPSTICK
Appearance: NEGATIVE
Bilirubin, UA: NEGATIVE
Blood, UA: NEGATIVE
Glucose, UA: NEGATIVE
Ketones, UA: NEGATIVE
Leukocytes, UA: NEGATIVE
Nitrite, UA: NEGATIVE
Odor: NEGATIVE
Protein, UA: NEGATIVE
Spec Grav, UA: 1.01 (ref 1.010–1.025)
Urobilinogen, UA: NEGATIVE E.U./dL — AB
pH, UA: 6.5 (ref 5.0–8.0)

## 2018-05-21 NOTE — Progress Notes (Signed)
   Subjective:    Patient ID: Erika Murphy, female    DOB: 1950/05/29, 68 y.o.   MRN: [id number]  HPI 68 year old Female with MS  having abdominal pain after eating.  Had upper endoscopy and colonoscopy by Dr. Benson Norway in May 2019.  Just started Nexium last week even though was prescribed May 5th.  She did not get the prescription filled right away.  H. pylori breath test ordered today.  White count is low at 2800.  Has had low white blood cell count for some time consistent with benign ethnic neutropenia.  Hemoglobin 11.9 g.  Nonfasting glucose 106.  BUN and creatinine are normal.  Liver functions are normal.  TSH is normal.  B12 level greater than 2000   and folate are normal.  Family member gives her B12 injections monthly due to history of gastrectomy.  Also followed by Dr. Marin Olp status post carcinoma of the stomach.  She has multiple sclerosis but currently is not on treatment for it.  She takes Lyrica for neuropathic pain.  Multiple sclerosis was diagnosed 40.  History of gastric cancer status post subtotal gastrectomy and partial omentectomy in 2001.  Weight is usually around 100 pounds.  History of osteopenia.  Had right hip arthroplasty after a fall with right femoral fracture 2016.  Had colonoscopy and endoscopy by Dr. Benson Norway in 2019 which was normal and no evidence of recurrent cancer.  Ambulates with a cane due to a gait disorder from multiple sclerosis.  Review of Systems see above     Objective:   Physical Exam  Patient has some mild epigastric tenderness.  H. pylori breath testing was negative.  Prealbumin is low normal at 18.  Iron level is normal.      Assessment & Plan:  Epigastric pain-?  Gastritis versus GE reflux  Plan: She recently started Nexium.  Has not been taking it very long and if not improving we will refer her back to Dr. Almyra Free.  Multiple sclerosis- seems to be stable.  Just on Lyrica for neuropathic pain.  History of gastric cancer-followed by Dr.  Benson Norway and Dr. Marin Olp with endoscopy 2019.  B12 deficiency secondary to gastrectomy-level is normal with monthly B12 injections by family member.  Fragility-we will monitor her weight frequently.  Generally weighs about 100 pounds.  Dysuria-urine specimen is normal by dipstick today  Essential hypertension-stable on current regimen  History of mild memory loss today seems to be stable

## 2018-05-22 LAB — CBC WITH DIFFERENTIAL/PLATELET
Absolute Monocytes: 316 cells/uL (ref 200–950)
Basophils Absolute: 20 cells/uL (ref 0–200)
Basophils Relative: 0.7 %
Eosinophils Absolute: 50 cells/uL (ref 15–500)
Eosinophils Relative: 1.8 %
HCT: 35.5 % (ref 35.0–45.0)
Hemoglobin: 11.9 g/dL (ref 11.7–15.5)
Lymphs Abs: 885 cells/uL (ref 850–3900)
MCH: 29.8 pg (ref 27.0–33.0)
MCHC: 33.5 g/dL (ref 32.0–36.0)
MCV: 88.8 fL (ref 80.0–100.0)
MPV: 11.8 fL (ref 7.5–12.5)
Monocytes Relative: 11.3 %
Neutro Abs: 1529 cells/uL (ref 1500–7800)
Neutrophils Relative %: 54.6 %
Platelets: 170 10*3/uL (ref 140–400)
RBC: 4 10*6/uL (ref 3.80–5.10)
RDW: 12 % (ref 11.0–15.0)
Total Lymphocyte: 31.6 %
WBC: 2.8 10*3/uL — ABNORMAL LOW (ref 3.8–10.8)

## 2018-05-22 LAB — COMPLETE METABOLIC PANEL WITH GFR
AG Ratio: 1.2 (calc) (ref 1.0–2.5)
ALT: 13 U/L (ref 6–29)
AST: 24 U/L (ref 10–35)
Albumin: 3.8 g/dL (ref 3.6–5.1)
Alkaline phosphatase (APISO): 91 U/L (ref 37–153)
BUN: 9 mg/dL (ref 7–25)
CO2: 28 mmol/L (ref 20–32)
Calcium: 9.3 mg/dL (ref 8.6–10.4)
Chloride: 105 mmol/L (ref 98–110)
Creat: 0.97 mg/dL (ref 0.50–0.99)
GFR, Est African American: 69 mL/min/{1.73_m2} (ref >=60)
GFR, Est Non African American: 60 mL/min/{1.73_m2} (ref >=60)
Globulin: 3.3 g/dL (calc) (ref 1.9–3.7)
Glucose, Bld: 106 mg/dL — ABNORMAL HIGH (ref 65–99)
Potassium: 3.9 mmol/L (ref 3.5–5.3)
Sodium: 141 mmol/L (ref 135–146)
Total Bilirubin: 0.7 mg/dL (ref 0.2–1.2)
Total Protein: 7.1 g/dL (ref 6.1–8.1)

## 2018-05-22 LAB — IRON, TOTAL/TOTAL IRON BINDING CAP
%SAT: 26 % (calc) (ref 16–45)
Iron: 101 ug/dL (ref 45–160)
TIBC: 395 mcg/dL (calc) (ref 250–450)

## 2018-05-22 LAB — PREALBUMIN: Prealbumin: 18 mg/dL (ref 17–34)

## 2018-05-22 LAB — FOLATE: Folate: 11.7 ng/mL

## 2018-05-22 LAB — H. PYLORI BREATH TEST: H. pylori Breath Test: NOT DETECTED

## 2018-05-22 LAB — VITAMIN B12: Vitamin B-12: >2000 pg/mL — ABNORMAL HIGH (ref 200–1100)

## 2018-05-22 LAB — TSH: TSH: 0.62 mIU/L (ref 0.40–4.50)

## 2018-06-01 NOTE — Progress Notes (Signed)
   Subjective:    Patient ID: Erika Murphy, female    DOB: Nov 28, 1950, 68 y.o.   MRN: [id number]  HPI Patient called complaining of lower abdominal pain for several days.  She denies constipation.  Denies urinary tract infection symptoms.  She has a history of multiple sclerosis.  She has a remote history of gastric cancer but had follow-up by Dr. Benson Norway in 2019 with endoscopy.  Due to the coronavirus epidemic, attempt was made to see patient using interactive audio and video telecommunications.  However, video failed due to patient having technical difficulties and we proceeded with audio only.  Takes Lyrica for neuropathic pain with multiple sclerosis.  Receives B12 injections for B12 deficiency due to gastrectomy by family member monthly.  History of fragility and decreased appetite which is followed closely.  History of essential hypertension.  She denies vomiting but sometimes feels nauseated.  I asked if she could come to the  office and she said she would have to find transportation      Review of Systems the above     Objective:   Physical Exam Patient gives vague history of lower abdominal pain but denies constipation diarrhea and vomiting.  Has felt nauseated at times.  We are frequently worried about her caloric intake.  She is thin and frail.  This is been monitored closely.  She has remote history of urinary tract infection but has no dysuria.  No fever or chills.  Unable to examine her due to method of visit which is virtual/audio      Assessment & Plan:  On Nexium 40 mg daily for presumed gastritis although this is more lower abdominal pain and epigastric pain.  We will follow-up with her in a couple of weeks in the office.  He will call if symptoms worsen.

## 2018-06-01 NOTE — Patient Instructions (Signed)
Continue Nexium since she just started taking it.  If symptoms are not improving call us back and we will refer you back to Dr.Hung.  Please try to eat well.  Continue B12 injections at home.

## 2018-06-02 NOTE — Patient Instructions (Signed)
Try Nexium 40 mg daily and follow-up in the office if not improving.

## 2018-06-06 ENCOUNTER — Other Ambulatory Visit: Payer: Self-pay | Admitting: Internal Medicine

## 2018-06-13 DIAGNOSIS — H40023 Open angle with borderline findings, high risk, bilateral: Secondary | ICD-10-CM | POA: Diagnosis not present

## 2018-06-15 ENCOUNTER — Other Ambulatory Visit: Payer: Self-pay | Admitting: Internal Medicine

## 2018-06-20 ENCOUNTER — Telehealth: Payer: Self-pay | Admitting: Internal Medicine

## 2018-06-20 DIAGNOSIS — Z13820 Encounter for screening for osteoporosis: Secondary | ICD-10-CM

## 2018-06-20 NOTE — Telephone Encounter (Signed)
Erika Murphy [phone]  Erika Murphy called to say that she had a tele visit with [facility] and they suggested because of her age that she have Bone Density test, so she is wandering if you would put in a referral for her to have that.

## 2018-06-20 NOTE — Telephone Encounter (Signed)
Dexa scan has been ordered.

## 2018-06-20 NOTE — Telephone Encounter (Signed)
Please order bone density °

## 2018-07-12 ENCOUNTER — Telehealth: Payer: Self-pay | Admitting: Internal Medicine

## 2018-07-12 NOTE — Telephone Encounter (Signed)
Erika Murphy [phone]  [name] called to say that a few days ago she started hurting in her right leg but last night the pain was severe she would like to come in and be seen.

## 2018-07-12 NOTE — Telephone Encounter (Signed)
Called Diem to let her know what DR Renold Genta said, she verbalized understanding.

## 2018-07-12 NOTE — Telephone Encounter (Signed)
I think this is likely MS and she should call her neurologist. We have been through this before.

## 2018-07-15 ENCOUNTER — Encounter: Payer: Self-pay | Admitting: *Deleted

## 2018-07-15 ENCOUNTER — Telehealth: Payer: Self-pay | Admitting: Neurology

## 2018-07-15 NOTE — Telephone Encounter (Signed)
Patient called and stated that she needs a follow up for her MS. I offered her the first available and scheduled her but she has requested to speak with the nurse because she feels it is urgent. Please call and advise.

## 2018-07-15 NOTE — Telephone Encounter (Signed)
Reports pain in her right leg for the last month.  Initially, the pain was intermittent but has now become constant.  Lyrica 50mg , one capsule TID makes the discomfort tolerable but does not resolve the pain completely.  Otherwise, no other new concerns since her last appt on 04/20/2018.  She has been added to Dr. Rhea Belton schedule on 07/16/2018.

## 2018-07-16 ENCOUNTER — Ambulatory Visit (INDEPENDENT_AMBULATORY_CARE_PROVIDER_SITE_OTHER): Payer: Medicare Other | Admitting: Neurology

## 2018-07-16 ENCOUNTER — Encounter: Payer: Self-pay | Admitting: Neurology

## 2018-07-16 ENCOUNTER — Other Ambulatory Visit: Payer: Self-pay

## 2018-07-16 VITALS — BP 124/64 | HR 60 | Temp 98.2°F | Ht 65.0 in | Wt 98.0 lb

## 2018-07-16 DIAGNOSIS — G35 Multiple sclerosis: Secondary | ICD-10-CM | POA: Diagnosis not present

## 2018-07-16 DIAGNOSIS — M5416 Radiculopathy, lumbar region: Secondary | ICD-10-CM

## 2018-07-16 DIAGNOSIS — R269 Unspecified abnormalities of gait and mobility: Secondary | ICD-10-CM

## 2018-07-16 NOTE — Progress Notes (Signed)
PATIENT: Erika Murphy DOB: 26-Dec-1950  Chief Complaint  Patient presents with  . Multiple Sclerosis    Reports pain in her right leg for the last month.  Initially, the pain was intermittent but has now become constant.  Lyrica 50mg , one capsule TID makes the discomfort tolerable but does not resolve the pain completely.  Otherwise, no other new concerns since her last appt on 04/20/2018.     HISTORICAL  Erika Murphy is a 68 years old right-handed [place] American female, patient of Dr. Erling Murphy, followed up for multiple sclerosis.  Her primary care physician is Dr. Tommie Ard Murphy  She was diagnosed with multiple sclerosis in 1998, presented with spastic gait disorder, was treated with Betaseron since 1998, which was stopped in 2011 due to abnormal liver functional test, her abnormal liver function test did recover back to normal after stopping Betaseron.  Her multiple sclerosis symptoms have been  fairly stable over years, there was no worsening of her symptoms without taking Betaseron and baclofen, she is only taking Lyrica 50 mg 3 times a day for neuropathic pain involving bilateral upper and lower extremities.  Since July 2014, she also had intermittent left shoulder radiating pain to her left lateral arm, left hand, sometimes woke her up from sleep, intermittent, there was no significant worsening of weakness.  She also had a history of left shoulder replacement in 2011,  She has a history of gastric adenocarcinoma, status post subtotal gastrectomy, partial omentectomy in March 20001, status post chemotherapy and radiation therapy.  Most recent MRI of the brain was in June 2013, small white matter hyperintensities are unchanged from 2010. No enhancing or restricted diffusion lesions are seen. These lesions are nonspecific and could be seen with multiple sclerosis or chronic microvascular ischemia most abnormality is seen in MRI of cervical spine, most recently in 2011,  MRI scan of  the cervical spine in 2011 showed prominent spondylitic changes from C4-6 with mild left sided foramnial stenosis at C4-5, C5-6 and bilateral foraminal stenosis at C6-7. Illdefined spinal cord hyperintensities at C5-6 and T2-3 likely represent remote age demyelinating plaque  She lives alone, ambulate with a cane, no bowel bladder incontinence.  She is dong well, still lives alone in a house with couple steps, using cane to ambulate, she never drive. She is taking Lyrica 50 mg 3 times a day for her upper and lower extremity pain, which has been very helpful, taking Valium 5 mg as needed,  Over the years, her multiple sclerosis symptoms has been very stable, there is no worsening of her gait problem.  She was admitted to the hospital on October 8th 2015, complaining of worsening gait difficulty, worsening right arm, and right leg weakness, deep achy pain, this was preceded by vomitting, loose stool for few days, was treated with IV Solu-Medrol for 3 days, repeat MRI of the brain, and cervical spine in Oct 2015, showed no acute lesions.  She continued to ambulate with a cane, dragging her right leg, reported 80% improvement, receiving home physical therapy, which has been helpful, Tylenol has been helpful too  UPDATE Oct 21 2014: She fell, and the broke her right hip require hip replacement August 2016, completed physical therapy.  She has increased leg stiffiness, pain. Aleve prn helps.  UPDATE Oct 18th 2017: Today she comes in with a new complains of gradual onset memory loss, difficulty remembering conversations, forgetting peoples name,  There was no family history of dementia, she just came back from the  trip visiting her twin sister at [place], she continues to complains of mild memory loss  I personally reviewed MRI of the brain in 2015, only mild supratentorium white matter gliosis, mild generalized atrophy,    I reviewed laboratory evaluation in October 2017, normal vitamin O13,  folic acid, TSH, iron panels, CBC showed mildly decreased WBC 3.2, hemoglobin of 12 point 5, she has a history of vitamin D deficiency, level was 22 in 2015, was 10 in 2016,  She had a previous history of vitamin D deficiency, but is not taking any vitamin D supplements.  UPDATE April 19 2016: She has numbness in her right fingers, pain at her right arm, radiating pain from right neck to right shoulder, right arm, she also has right low back pain, radiating pain to right buttock, she use cane at her right hand, sometimes right finger numbness woke her up from sleep.  We have personally reviewed MRI of the brain in January 2018, few T2/FLAIR hyperintensity lesion, stable from 2015, MRI cervical spine in 2015, multilevel mild degenerative changes, chronic C5-6 lesion, stable, she also complains of right foot weakness, intermittent low back pain.  UPDATE July 16 2018: She is not on any long-term immunomodulation therapy for MS, there is no worsening of her MS symptoms, but since June 2020, without clear triggers, she began to notice radiating pain to her right hip region, radiating to right leg, increased gait abnormality, she denies significant left lower extremity symptoms, denies bowel and bladder incontinence   REVIEW OF SYSTEMS: Full 14 system review of systems performed and notable only for as above All other review of systems were negative.  ALLERGIES: No Known Allergies  HOME MEDICATIONS: Current Outpatient Medications  Medication Sig Dispense Refill  . albuterol (PROVENTIL HFA;VENTOLIN HFA) 108 (90 Base) MCG/ACT inhaler Inhale 2 puffs into the lungs every 6 (six) hours as needed for wheezing or shortness of breath. 1 Inhaler 12  . amLODipine (NORVASC) 2.5 MG tablet TAKE 1 TABLET BY MOUTH EVERY DAY 90 tablet 3  . cyanocobalamin (,VITAMIN B-12,) 1000 MCG/ML injection INJECT 1 ML (1,000 MCG TOTAL) INTO THE MUSCLE ONCE A MONTH AS DIRECTED 3 mL 3  . esomeprazole (NEXIUM) 40 MG capsule  TAKE 1 CAPSULE (40 MG TOTAL) BY MOUTH DAILY AT 12 NOON. 90 capsule 0  . ibuprofen (ADVIL) 200 MG tablet Take 200 mg by mouth daily as needed.    . Multiple Minerals-Vitamins (NUTRA-SUPPORT BONE PO) Take 2 each by mouth daily.    . Multiple Vitamins-Minerals (MULTIVITAMIN GUMMIES WOMENS PO) Take 1 each by mouth daily.     . pregabalin (LYRICA) 50 MG capsule TAKE 1 CAPSULE BY MOUTH THREE TIMES A DAY 90 capsule 5   No current facility-administered medications for this visit.     PAST MEDICAL HISTORY: Past Medical History:  Diagnosis Date  . Anxiety   . Arthritis    "right shoulder" (10/09/2013)  . Asthma    pt denies  . B12 deficiency anemia    pt denies  . Fibromyalgia   . GERD (gastroesophageal reflux disease)   . History of blood transfusion 2001   "when I had my stomach cancer"  . Hypertension   . IBS (irritable bowel syndrome)   . Iron deficiency anemia 12/11/2014  . Malabsorption of iron 12/11/2014  . MS (multiple sclerosis) ([place]) dx'd ~ 1998  . Neurogenic bladder   . Osteopenia   . Osteoporosis   . PONV (postoperative nausea and vomiting)   . Stomach cancer ([place])  2001  . Thyroid nodule    right    PAST SURGICAL HISTORY: Past Surgical History:  Procedure Laterality Date  . BIOPSY THYROID Right 1990's  . [place]; 1984  . COLONOSCOPY WITH PROPOFOL N/A 05/04/2017   Procedure: COLONOSCOPY WITH PROPOFOL;  Surgeon: Carol Ada, MD;  Location: [facility];  Service: Endoscopy;  Laterality: N/A;  . ESOPHAGOGASTRODUODENOSCOPY (EGD) WITH PROPOFOL N/A 05/04/2017   Procedure: ESOPHAGOGASTRODUODENOSCOPY (EGD) WITH PROPOFOL;  Surgeon: Carol Ada, MD;  Location: [facility];  Service: Endoscopy;  Laterality: N/A;  . JOINT REPLACEMENT    . OMENTECTOMY  2001   partial  . PARTIAL GASTRECTOMY  2001  . TONSILLECTOMY  ~ 1959  . TOTAL HIP ARTHROPLASTY Right 08/18/2014   Procedure: TOTAL HIP ARTHROPLASTY ANTERIOR APPROACH;  Surgeon: Rod Can, MD;  Location: [facility];   Service: Orthopedics;  Laterality: Right;  . TOTAL SHOULDER ARTHROPLASTY Left 2011    FAMILY HISTORY: Family History  Problem Relation Age of Onset  . Heart disease Mother   . Diabetes Father   . Stomach cancer Brother   . Multiple sclerosis Other   . Breast cancer Sister        2 sisters breast cancer   . Breast cancer Maternal Aunt     SOCIAL HISTORY: Social History   Socioeconomic History  . Marital status: Divorced    Spouse name: Not on file  . Number of children: 3   . Years of education: Not on file  . Highest education level: Not on file  Occupational History  . Occupation: DISABLED    Employer: [employer]  . Financial resource strain: Not on file  . Food insecurity    Worry: Not on file    Inability: Not on file  . Transportation needs    Medical: Not on file    Non-medical: Not on file  Tobacco Use  . Smoking status: Never Smoker  . Smokeless tobacco: Never Used  . Tobacco comment: NEVER USED TOBACCO  Substance and Sexual Activity  . Alcohol use: No    Alcohol/week: 0.0 standard drinks  . Drug use: No  . Sexual activity: Not Currently  Lifestyle  . Physical activity    Days per week: Not on file    Minutes per session: Not on file  . Stress: Not on file  Relationships  . Social Herbalist on phone: Not on file    Gets together: Not on file    Attends religious service: Not on file    Active member of club or organization: Not on file    Attends meetings of clubs or organizations: Not on file    Relationship status: Not on file  . Intimate partner violence    Fear of current or ex partner: Not on file    Emotionally abused: Not on file    Physically abused: Not on file    Forced sexual activity: Not on file  Other Topics Concern  . Not on file  Social History Narrative   Patient is retired.    Patient is married with 3 children.   Patient drinks caffeine daily.      PHYSICAL EXAM   Vitals:   07/16/18 1259   BP: 124/64  Pulse: 60  Temp: 98.2 F (36.8 C)  Weight: 98 lb (44.5 kg)  Height: 5\' 5"  (1.651 m)    Not recorded      Body mass index is 16.31 kg/m.  PHYSICAL EXAMNIATION:  Gen: NAD, conversant, well nourised, obese, well groomed                     Cardiovascular: Regular rate rhythm, no peripheral edema, warm, nontender. Eyes: Conjunctivae clear without exudates or hemorrhage Neck: Supple, no carotid bruits. Pulmonary: Clear to auscultation bilaterally   NEUROLOGICAL EXAM:  MENTAL STATUS: Speech:    Speech is normal; fluent and spontaneous with normal comprehension.  Cognition:     Orientation to time, place and person     Normal recent and remote memory     Normal Attention span and concentration     Normal Language, naming, repeating,spontaneous speech     Fund of knowledge   CRANIAL NERVES: CN II: Visual fields are full to confrontation.  Pupils are round equal and briskly reactive to light. CN III, IV, VI: extraocular movement are normal. No ptosis. CN V: Facial sensation is intact to pinprick in all 3 divisions bilaterally. Corneal responses are intact.  CN VII: Face is symmetric with normal eye closure and smile. CN VIII: Hearing is normal to rubbing fingers CN IX, X: Palate elevates symmetrically. Phonation is normal. CN XI: Head turning and shoulder shrug are intact CN XII: Tongue is midline with normal movements and no atrophy.  MOTOR: Both upper extremity motor strength is normal, mild limitation on bilateral lower extremity especially right leg proximal muscle strength testing due to pain, there was no significant weakness noted.  REFLEXES: Reflexes are 1 and symmetric at the biceps, triceps, knees, and ankles. Plantar responses are flexor.  SENSORY: Intact to light touch, pinprick, and vibratory sensation  COORDINATION: Rapid alternating movements and fine finger movements are intact. There is no dysmetria on finger-to-nose and heel-knee-shin.     GAIT/STANCE: Need to push up to get up from seated position, stiff, dragging both legs,  DIAGNOSTIC DATA (LABS, IMAGING, TESTING) - I reviewed patient records, labs, notes, testing and imaging myself where available.   ASSESSMENT AND PLAN  ZAMORIA BOSS is a 68 y.o. female   Secondary progressive multiple sclerosis,  Clinical and imaging wise was stable, is not on any long-term immunomodulation therapy   Right low back pain, radiating pain to right lower extremity  Most consistent with right lumbar radiculopathy  Proceed with MRI of lumbar  EMG nerve conduction study  Lyrica 50 mg 3 times a day as needed for pain,   Marcial Pacas, M.D. Ph.D.  [facility] [address] [place], [place] [address] Ph: [phone] Fax: [phone]  CC: Referring Provider

## 2018-07-17 ENCOUNTER — Telehealth: Payer: Self-pay | Admitting: Neurology

## 2018-07-17 NOTE — Telephone Encounter (Signed)
[employer] medicare order sent to GI. No auth they will reach out to the patient to schedule.  

## 2018-07-17 NOTE — Telephone Encounter (Signed)
Pt called in requesting to speak with manager, she states she was seen in office yesterday and had to leave and didn't get a chance to speak with manager  [phone]

## 2018-07-17 NOTE — Telephone Encounter (Signed)
I called the patient. She had some questions about billing. I will discuss with billing manager tomorrow.

## 2018-07-25 ENCOUNTER — Ambulatory Visit: Payer: Medicare Other

## 2018-07-25 ENCOUNTER — Inpatient Hospital Stay (HOSPITAL_BASED_OUTPATIENT_CLINIC_OR_DEPARTMENT_OTHER): Payer: Medicare Other | Admitting: Hematology & Oncology

## 2018-07-25 ENCOUNTER — Encounter: Payer: Self-pay | Admitting: Hematology & Oncology

## 2018-07-25 ENCOUNTER — Inpatient Hospital Stay: Payer: Medicare Other | Attending: Hematology & Oncology

## 2018-07-25 ENCOUNTER — Other Ambulatory Visit: Payer: Self-pay

## 2018-07-25 VITALS — BP 116/54 | HR 61 | Temp 97.8°F | Resp 18 | Ht 65.0 in | Wt 96.0 lb

## 2018-07-25 DIAGNOSIS — D500 Iron deficiency anemia secondary to blood loss (chronic): Secondary | ICD-10-CM

## 2018-07-25 DIAGNOSIS — D509 Iron deficiency anemia, unspecified: Secondary | ICD-10-CM | POA: Diagnosis not present

## 2018-07-25 DIAGNOSIS — K909 Intestinal malabsorption, unspecified: Secondary | ICD-10-CM

## 2018-07-25 DIAGNOSIS — Z79899 Other long term (current) drug therapy: Secondary | ICD-10-CM | POA: Insufficient documentation

## 2018-07-25 DIAGNOSIS — Z85028 Personal history of other malignant neoplasm of stomach: Secondary | ICD-10-CM | POA: Insufficient documentation

## 2018-07-25 DIAGNOSIS — G35 Multiple sclerosis: Secondary | ICD-10-CM

## 2018-07-25 DIAGNOSIS — D510 Vitamin B12 deficiency anemia due to intrinsic factor deficiency: Secondary | ICD-10-CM | POA: Diagnosis not present

## 2018-07-25 DIAGNOSIS — E538 Deficiency of other specified B group vitamins: Secondary | ICD-10-CM

## 2018-07-25 DIAGNOSIS — Z9049 Acquired absence of other specified parts of digestive tract: Secondary | ICD-10-CM | POA: Diagnosis not present

## 2018-07-25 LAB — CMP (CANCER CENTER ONLY)
ALT: 11 U/L (ref 0–44)
AST: 24 U/L (ref 15–41)
Albumin: 3.9 g/dL (ref 3.5–5.0)
Alkaline Phosphatase: 93 U/L (ref 38–126)
Anion gap: 6 (ref 5–15)
BUN: 12 mg/dL (ref 8–23)
CO2: 30 mmol/L (ref 22–32)
Calcium: 8.6 mg/dL — ABNORMAL LOW (ref 8.9–10.3)
Chloride: 106 mmol/L (ref 98–111)
Creatinine: 1.02 mg/dL — ABNORMAL HIGH (ref 0.44–1.00)
GFR, Est AFR Am: >60 mL/min (ref >60)
GFR, Estimated: 56 mL/min — ABNORMAL LOW (ref >60)
Glucose, Bld: 131 mg/dL — ABNORMAL HIGH (ref 70–99)
Potassium: 3.8 mmol/L (ref 3.5–5.1)
Sodium: 142 mmol/L (ref 135–145)
Total Bilirubin: 0.7 mg/dL (ref 0.3–1.2)
Total Protein: 6.8 g/dL (ref 6.5–8.1)

## 2018-07-25 LAB — RETICULOCYTES
Immature Retic Fract: 4.5 % (ref 2.3–15.9)
RBC.: 3.91 MIL/uL (ref 3.87–5.11)
Retic Count, Absolute: 42.6 10*3/uL (ref 19.0–186.0)
Retic Ct Pct: 1.1 % (ref 0.4–3.1)

## 2018-07-25 LAB — CBC WITH DIFFERENTIAL (CANCER CENTER ONLY)
Abs Immature Granulocytes: 0.02 10*3/uL (ref 0.00–0.07)
Basophils Absolute: 0 10*3/uL (ref 0.0–0.1)
Basophils Relative: 1 %
Eosinophils Absolute: 0.1 10*3/uL (ref 0.0–0.5)
Eosinophils Relative: 3 %
HCT: 36.3 % (ref 36.0–46.0)
Hemoglobin: 11.3 g/dL — ABNORMAL LOW (ref 12.0–15.0)
Immature Granulocytes: 1 %
Lymphocytes Relative: 30 %
Lymphs Abs: 0.9 10*3/uL (ref 0.7–4.0)
MCH: 28.8 pg (ref 26.0–34.0)
MCHC: 31.1 g/dL (ref 30.0–36.0)
MCV: 92.4 fL (ref 80.0–100.0)
Monocytes Absolute: 0.3 10*3/uL (ref 0.1–1.0)
Monocytes Relative: 8 %
Neutro Abs: 1.8 10*3/uL (ref 1.7–7.7)
Neutrophils Relative %: 57 %
Platelet Count: 171 10*3/uL (ref 150–400)
RBC: 3.93 MIL/uL (ref 3.87–5.11)
RDW: 12.4 % (ref 11.5–15.5)
WBC Count: 3.1 10*3/uL — ABNORMAL LOW (ref 4.0–10.5)
nRBC: 0 % (ref 0.0–0.2)

## 2018-07-25 LAB — VITAMIN B12: Vitamin B-12: 1492 pg/mL — ABNORMAL HIGH (ref 180–914)

## 2018-07-25 MED ORDER — ACYCLOVIR 5 % EX OINT: 1.0000 "application " | TOPICAL_OINTMENT | Freq: Three times a day (TID) | CUTANEOUS | 1 refills | Status: DC

## 2018-07-25 NOTE — Progress Notes (Signed)
Hematology and Oncology Follow Up Visit  Erika Murphy [id number] 03-26-1950 68 y.o. 07/25/2018   Principle Diagnosis:   Stage II (T3N0M0) adenocarcinoma of the stomach-remission Recurrent iron deficiency anemia  Pernicious anemia Multiple sclerosis Current Therapy:    IV iron as indicated  Vitamin B-12 1 mg IM every month     Interim History:  [name] is back for follow-up.  She  seems to be doing fairly well.  Despite the coronavirus, she is staying safe.  She is really not going to wear.  I feel bad that she is not been able to go up to [place].  She was goes up to [place] in the summer to be with her family.  It looks like she has an area of shingles over on the right T- 12 dermatome.  It is a little bit painful.  I will go ahead and try her on some acyclovir cream.  Her multiple sclerosis is doing okay.  I will think she is had any flareups of this.  She is not eating as much.  She has had no obvious change in bowel or bladder habits.  She does get her vitamin B12 at home now.  This makes it a lot easier for her.    Last iron studies that we did on her back in January showed a ferritin of 9 with an iron saturation of 28%.    Overall, I said her performance status is ECOG 2.    Medications:  Current Outpatient Medications:  .  albuterol (PROVENTIL HFA;VENTOLIN HFA) 108 (90 Base) MCG/ACT inhaler, Inhale 2 puffs into the lungs every 6 (six) hours as needed for wheezing or shortness of breath., Disp: 1 Inhaler, Rfl: 12 .  amLODipine (NORVASC) 2.5 MG tablet, TAKE 1 TABLET BY MOUTH EVERY DAY, Disp: 90 tablet, Rfl: 3 .  cyanocobalamin (,VITAMIN B-12,) 1000 MCG/ML injection, INJECT 1 ML (1,000 MCG TOTAL) INTO THE MUSCLE ONCE A MONTH AS DIRECTED, Disp: 3 mL, Rfl: 3 .  esomeprazole (NEXIUM) 40 MG capsule, TAKE 1 CAPSULE (40 MG TOTAL) BY MOUTH DAILY AT 12 NOON., Disp: 90 capsule, Rfl: 0 .  ibuprofen (ADVIL) 200 MG tablet, Take 200 mg by mouth daily as needed., Disp: , Rfl:  .   Multiple Minerals-Vitamins (NUTRA-SUPPORT BONE PO), Take 2 each by mouth daily., Disp: , Rfl:  .  Multiple Vitamins-Minerals (MULTIVITAMIN GUMMIES WOMENS PO), Take 1 each by mouth daily. , Disp: , Rfl:  .  pregabalin (LYRICA) 50 MG capsule, TAKE 1 CAPSULE BY MOUTH THREE TIMES A DAY, Disp: 90 capsule, Rfl: 5 .  acyclovir ointment (ZOVIRAX) 5 %, Apply 1 application topically every 8 (eight) hours., Disp: 15 g, Rfl: 1  Allergies: No Known Allergies  Past Medical History, Surgical history, Social history, and Family History were reviewed and updated.  Review of Systems: Review of Systems  Constitutional: Positive for malaise/fatigue.  HENT: Positive for tinnitus.   Eyes: Positive for blurred vision.  Respiratory: Negative.   Cardiovascular: Negative.   Gastrointestinal: Positive for diarrhea.  Genitourinary: Negative.   Musculoskeletal: Positive for myalgias.  Skin: Negative.   Neurological: Positive for tingling and weakness.  Endo/Heme/Allergies: Negative.   Psychiatric/Behavioral: Negative.     Physical Exam:  height is 5\' 5"  (1.651 m) and weight is 96 lb (43.5 kg). Her temporal temperature is 97.8 F (36.6 C). Her blood pressure is 116/54 (abnormal) and her pulse is 61. Her respiration is 18 and oxygen saturation is 100%.   Physical Exam Vitals signs  reviewed.  HENT:     Head: Normocephalic and atraumatic.  Eyes:     Pupils: Pupils are equal, round, and reactive to light.  Neck:     Musculoskeletal: Normal range of motion.  Cardiovascular:     Rate and Rhythm: Normal rate and regular rhythm.     Heart sounds: Normal heart sounds.  Pulmonary:     Effort: Pulmonary effort is normal.     Breath sounds: Normal breath sounds.  Abdominal:     General: Bowel sounds are normal.     Palpations: Abdomen is soft.  Musculoskeletal: Normal range of motion.        General: No tenderness or deformity.  Lymphadenopathy:     Cervical: No cervical adenopathy.  Skin:    General: Skin  is warm and dry.     Findings: No erythema or rash.  Neurological:     Mental Status: She is alert and oriented to person, place, and time.  Psychiatric:        Behavior: Behavior normal.        Thought Content: Thought content normal.        Judgment: Judgment normal.      Lab Results  Component Value Date   WBC 3.1 (L) 07/25/2018   HGB 11.3 (L) 07/25/2018   HCT 36.3 07/25/2018   MCV 92.4 07/25/2018   PLT 171 07/25/2018     Chemistry      Component Value Date/Time   NA 142 07/25/2018 1132   NA 144 06/12/2016 1151   NA 142 06/11/2015 1156   K 3.8 07/25/2018 1132   K 4.7 06/12/2016 1151   K 4.3 06/11/2015 1156   CL 106 07/25/2018 1132   CL 107 06/12/2016 1151   CO2 30 07/25/2018 1132   CO2 32 06/12/2016 1151   CO2 29 06/11/2015 1156   BUN 12 07/25/2018 1132   BUN 11 06/12/2016 1151   BUN 13.5 06/11/2015 1156   CREATININE 1.02 (H) 07/25/2018 1132   CREATININE 0.97 05/21/2018 1151   CREATININE 1.2 (H) 06/11/2015 1156      Component Value Date/Time   CALCIUM 8.6 (L) 07/25/2018 1132   CALCIUM 9.6 06/12/2016 1151   CALCIUM 9.7 06/11/2015 1156   ALKPHOS 93 07/25/2018 1132   ALKPHOS 97 (H) 06/12/2016 1151   ALKPHOS 111 06/11/2015 1156   AST 24 07/25/2018 1132   AST 30 06/11/2015 1156   ALT 11 07/25/2018 1132   ALT 26 06/12/2016 1151   ALT 23 06/11/2015 1156   BILITOT 0.7 07/25/2018 1132   BILITOT 1.03 06/11/2015 1156      Impression and Plan: Erika Murphy is a 68 year old African [place] female. She had a history of stage II stomach cancer. She underwent resection. She had adjuvant therapy. This is now about 20 years out from treatment. She is cured of the stomach cancer.  We will go ahead and plan to get her back in another 4 months.  We will see what her iron studies look like.  I called in the acyclovir ointment for her.  [name], MD 7/23/20201:15 PM

## 2018-07-26 LAB — FERRITIN: Ferritin: 23 ng/mL (ref 11–307)

## 2018-07-26 LAB — IRON AND TIBC
Iron: 77 ug/dL (ref 41–142)
Saturation Ratios: 19 % — ABNORMAL LOW (ref 21–57)
TIBC: 410 ug/dL (ref 236–444)
UIBC: 333 ug/dL (ref 120–384)

## 2018-07-27 ENCOUNTER — Other Ambulatory Visit: Payer: Self-pay | Admitting: Internal Medicine

## 2018-07-29 ENCOUNTER — Telehealth: Payer: Self-pay | Admitting: Hematology & Oncology

## 2018-07-29 NOTE — Telephone Encounter (Signed)
Called and spoke with patient regarding appointment added per 7/23 result note

## 2018-08-02 ENCOUNTER — Inpatient Hospital Stay: Payer: Medicare Other

## 2018-08-02 ENCOUNTER — Other Ambulatory Visit: Payer: Self-pay

## 2018-08-02 VITALS — BP 111/55 | HR 64 | Temp 97.7°F | Resp 17

## 2018-08-02 DIAGNOSIS — D509 Iron deficiency anemia, unspecified: Secondary | ICD-10-CM | POA: Diagnosis not present

## 2018-08-02 DIAGNOSIS — K909 Intestinal malabsorption, unspecified: Secondary | ICD-10-CM

## 2018-08-02 DIAGNOSIS — G35 Multiple sclerosis: Secondary | ICD-10-CM | POA: Diagnosis not present

## 2018-08-02 DIAGNOSIS — D500 Iron deficiency anemia secondary to blood loss (chronic): Secondary | ICD-10-CM

## 2018-08-02 DIAGNOSIS — Z85028 Personal history of other malignant neoplasm of stomach: Secondary | ICD-10-CM | POA: Diagnosis not present

## 2018-08-02 DIAGNOSIS — Z9049 Acquired absence of other specified parts of digestive tract: Secondary | ICD-10-CM | POA: Diagnosis not present

## 2018-08-02 DIAGNOSIS — Z79899 Other long term (current) drug therapy: Secondary | ICD-10-CM | POA: Diagnosis not present

## 2018-08-02 MED ORDER — SODIUM CHLORIDE 0.9 % IV SOLN
Freq: Once | INTRAVENOUS | Status: AC
  Administered 2018-08-02: 11:00:00 via INTRAVENOUS
  Filled 2018-08-02: qty 250

## 2018-08-02 MED ORDER — SODIUM CHLORIDE 0.9 % IV SOLN
510.0000 mg | Freq: Once | INTRAVENOUS | Status: AC
  Administered 2018-08-02: 510 mg via INTRAVENOUS
  Filled 2018-08-02: qty 510

## 2018-08-02 NOTE — Patient Instructions (Signed)

## 2018-08-06 ENCOUNTER — Ambulatory Visit: Payer: Medicare Other | Admitting: Neurology

## 2018-08-07 ENCOUNTER — Ambulatory Visit
Admission: RE | Admit: 2018-08-07 | Discharge: 2018-08-07 | Disposition: A | Discharge status: Home / Self Care | Payer: Medicare Other | Source: Ambulatory Visit | Attending: Neurology | Admitting: Neurology

## 2018-08-07 ENCOUNTER — Other Ambulatory Visit: Payer: Self-pay

## 2018-08-07 DIAGNOSIS — G35 Multiple sclerosis: Secondary | ICD-10-CM | POA: Diagnosis not present

## 2018-08-07 DIAGNOSIS — R269 Unspecified abnormalities of gait and mobility: Secondary | ICD-10-CM

## 2018-08-07 DIAGNOSIS — M5416 Radiculopathy, lumbar region: Secondary | ICD-10-CM | POA: Diagnosis not present

## 2018-08-14 ENCOUNTER — Other Ambulatory Visit: Payer: Self-pay | Admitting: Internal Medicine

## 2018-08-15 ENCOUNTER — Telehealth: Payer: Self-pay | Admitting: Neurology

## 2018-08-15 MED ORDER — DULOXETINE HCL 30 MG PO CPEP: 30.0000 mg | ORAL_CAPSULE | Freq: Every day | ORAL | 5 refills | Status: DC

## 2018-08-15 NOTE — Telephone Encounter (Signed)
I spoke to the patient.  States her back and bilateral leg pain is much worse.  Lyrica 50mg  TID has been working well for her until recently. She is requesting a medication for pain.  She would also like the results of her lumbar MRI.

## 2018-08-15 NOTE — Telephone Encounter (Signed)
Pt called wanting to know if her MRI results are in and also if something can be given to her in the meantime for the pain. Please advise.

## 2018-08-15 NOTE — Telephone Encounter (Signed)
I have spoke with the patient and she is aware of her lumbar MRI results. She would like to try duloxetine 30mg , one tablet daily for her discomfort.

## 2018-08-15 NOTE — Telephone Encounter (Signed)
Please let patient know,To her my chart, I have sent MRI lumbar report to my chart,  there was no significant abnormalities.  She may consider higher dose of Lyrica 100 mg twice a day, or add on Cymbalta 30 mg daily

## 2018-08-15 NOTE — Addendum Note (Signed)
Addended by: Desmond Lope on: 08/15/2018 04:53 PM   Modules accepted: Orders

## 2018-09-11 ENCOUNTER — Other Ambulatory Visit: Payer: Self-pay

## 2018-09-11 ENCOUNTER — Ambulatory Visit (INDEPENDENT_AMBULATORY_CARE_PROVIDER_SITE_OTHER): Payer: Medicare Other | Admitting: Neurology

## 2018-09-11 ENCOUNTER — Encounter: Payer: Medicare Other | Admitting: Neurology

## 2018-09-11 DIAGNOSIS — G35 Multiple sclerosis: Secondary | ICD-10-CM

## 2018-09-11 DIAGNOSIS — M5416 Radiculopathy, lumbar region: Secondary | ICD-10-CM

## 2018-09-11 DIAGNOSIS — Z0289 Encounter for other administrative examinations: Secondary | ICD-10-CM

## 2018-09-11 DIAGNOSIS — R269 Unspecified abnormalities of gait and mobility: Secondary | ICD-10-CM

## 2018-09-11 NOTE — Procedures (Signed)
Full Name: Erika Murphy Gender: Female MRN #: [id number] Date of Birth: [birth year]    Visit Date: 09/11/2018 08:51 Age: 68 Years 2 Months Old Examining Physician: Marcial Pacas, MD  Referring Physician: Marcial Pacas, MD History: 68 years old female, with history of right hip replacement, multiple sclerosis, gait abnormality, presenting with intermittent radiating pain to right lower extremity, MRI of lumbar was normal  Summary of the tests:  Nerve conduction study: Bilateral peroneal, tibial motor responses were normal.  Bilateral sural, superficial peroneal sensory responses were normal.  Electromyography: Selected needle examination of bilateral lower extremity muscles were normal, there was some decreased activation right lower extremity muscle testing due to her history of multiple sclerosis.  Conclusion:  This is a normal study.  There is no electrodiagnostic evidence of right lower extremity neuropathy or right lumbosacral radiculopathy.   ------------------------------- Marcial Pacas, M.D. PhD  [facility] [place], [place] [address] Tel: [phone] Fax: [phone]        [facility]    Nerve / Sites Muscle Latency Ref. Amplitude Ref. Rel Amp Segments Distance Velocity Ref. Area    ms ms mV mV %  cm m/s m/s mVms  R Peroneal - EDB     Ankle EDB 4.4 ?6.5 8.2 ?2.0 100 Ankle - EDB 9   18.7     Fib head EDB 10.7  7.0  85.7 Fib head - Ankle 29 46 ?44 17.2     Pop fossa EDB 12.9  6.6  94.5 Pop fossa - Fib head 10 46 ?44 16.8         Pop fossa - Ankle      L Peroneal - EDB     Ankle EDB 4.3 ?6.5 6.9 ?2.0 100 Ankle - EDB 9   15.0     Fib head EDB 10.5  6.0  86.4 Fib head - Ankle 29 47 ?44 13.7     Pop fossa EDB 12.6  5.6  94.2 Pop fossa - Fib head 10 47 ?44 13.2         Pop fossa - Ankle      R Tibial - AH     Ankle AH 4.3 ?5.8 8.4 ?4.0 100 Ankle - AH 9   16.6     Pop fossa AH 13.8  5.6  66.3 Pop fossa - Ankle 39 41 ?41 15.4  L Tibial - AH   Ankle AH 4.3 ?5.8 13.7 ?4.0 100 Ankle - AH 9   26.7     Pop fossa AH 13.1  7.6  55.7 Pop fossa - Ankle 39 44 ?41 22.3             SNC    Nerve / Sites Rec. Site Peak Lat Ref.  Amp Ref. Segments Distance    ms ms V V  cm  R Sural - Ankle (Calf)     Calf Ankle 3.7 ?4.4 14 ?6 Calf - Ankle 14  L Sural - Ankle (Calf)     Calf Ankle 3.4 ?4.4 13 ?6 Calf - Ankle 14  R Superficial peroneal - Ankle     Lat leg Ankle 4.4 ?4.4 6 ?6 Lat leg - Ankle 14  L Superficial peroneal - Ankle     Lat leg Ankle 4.1 ?4.4 6 ?6 Lat leg - Ankle 14              F  Wave    Nerve F Lat Ref.  ms ms  R Tibial - AH 60.0 ?56.0  L Tibial - AH 57.1 ?56.0         EMG       EMG Summary Table    Spontaneous MUAP Recruitment  Muscle IA Fib PSW Fasc Other Amp Dur. Poly Pattern  R. Tibialis anterior Normal None None  _______ Normal Normal Normal Normal  R. Tibialis posterior Normal None None None _______ Normal Normal Normal Normal  R. Peroneus longus Normal None None None _______ Normal Normal Normal Normal  R. Gastrocnemius (Medial head) Normal None None None _______ Normal Normal Normal Normal  R. Vastus lateralis Normal None None None _______ Normal Normal Normal Normal  L. Tibialis anterior Normal None None None _______ Normal Normal Normal Normal  L. Tibialis posterior Normal None None None _______ Normal Normal Normal Normal  L. Gastrocnemius (Medial head) Normal None None None _______ Normal Normal Normal Normal  L. Vastus lateralis Normal None None None _______ Normal Normal Normal Normal

## 2018-10-02 ENCOUNTER — Other Ambulatory Visit: Payer: Self-pay

## 2018-10-02 MED ORDER — PREGABALIN 50 MG PO CAPS: ORAL_CAPSULE | ORAL | 5 refills | Status: DC

## 2018-10-02 NOTE — Telephone Encounter (Signed)
I tried to book CPE but she is going to check with her niece to see when she's available to bring her in.

## 2018-10-11 ENCOUNTER — Ambulatory Visit (INDEPENDENT_AMBULATORY_CARE_PROVIDER_SITE_OTHER): Payer: Medicare Other | Admitting: Internal Medicine

## 2018-10-11 ENCOUNTER — Encounter: Payer: Self-pay | Admitting: Internal Medicine

## 2018-10-11 ENCOUNTER — Other Ambulatory Visit: Payer: Self-pay

## 2018-10-11 DIAGNOSIS — Z23 Encounter for immunization: Secondary | ICD-10-CM | POA: Diagnosis not present

## 2018-10-11 NOTE — Progress Notes (Signed)
Flu vaccine per CMA 

## 2018-10-11 NOTE — Patient Instructions (Signed)
Patient received a flu vaccine IM L deltoid, AV, CMA  

## 2018-10-17 ENCOUNTER — Telehealth: Payer: Self-pay | Admitting: Internal Medicine

## 2018-10-17 NOTE — Telephone Encounter (Signed)
Bernadeen Uhlmann [phone]  [facility] HFA 108 (90 BASE) MCG/ACT inhaler    Laylonie called to say she needs a refill on above medication, she has not got it filled in a long time she has been using her old one.

## 2018-10-21 MED ORDER — ALBUTEROL SULFATE HFA 108 (90 BASE) MCG/ACT IN AERS: 2.0000 | INHALATION_SPRAY | Freq: Four times a day (QID) | RESPIRATORY_TRACT | 0 refills | Status: DC | PRN

## 2018-10-21 NOTE — Addendum Note (Signed)
Addended by: Mady Haagensen on: 10/21/2018 04:22 PM   Modules accepted: Orders

## 2018-10-23 ENCOUNTER — Other Ambulatory Visit: Payer: Self-pay | Admitting: Internal Medicine

## 2018-10-23 DIAGNOSIS — N6320 Unspecified lump in the left breast, unspecified quadrant: Secondary | ICD-10-CM

## 2018-11-08 ENCOUNTER — Other Ambulatory Visit: Payer: Self-pay

## 2018-11-08 ENCOUNTER — Ambulatory Visit
Admission: RE | Admit: 2018-11-08 | Discharge: 2018-11-08 | Disposition: A | Discharge status: Home / Self Care | Payer: Medicare Other | Source: Ambulatory Visit | Attending: Internal Medicine | Admitting: Internal Medicine

## 2018-11-08 DIAGNOSIS — N6002 Solitary cyst of left breast: Secondary | ICD-10-CM | POA: Diagnosis not present

## 2018-11-08 DIAGNOSIS — N6320 Unspecified lump in the left breast, unspecified quadrant: Secondary | ICD-10-CM

## 2018-11-08 DIAGNOSIS — R922 Inconclusive mammogram: Secondary | ICD-10-CM | POA: Diagnosis not present

## 2018-11-08 DIAGNOSIS — N6489 Other specified disorders of breast: Secondary | ICD-10-CM | POA: Diagnosis not present

## 2018-11-10 ENCOUNTER — Other Ambulatory Visit: Payer: Self-pay | Admitting: Internal Medicine

## 2018-11-27 ENCOUNTER — Inpatient Hospital Stay: Payer: Medicare Other | Attending: Hematology & Oncology | Admitting: Hematology & Oncology

## 2018-11-27 ENCOUNTER — Other Ambulatory Visit: Payer: Self-pay

## 2018-11-27 ENCOUNTER — Inpatient Hospital Stay: Payer: Medicare Other

## 2018-11-27 VITALS — BP 124/59 | HR 78 | Temp 97.6°F | Resp 16 | Wt 98.0 lb

## 2018-11-27 DIAGNOSIS — D510 Vitamin B12 deficiency anemia due to intrinsic factor deficiency: Secondary | ICD-10-CM | POA: Insufficient documentation

## 2018-11-27 DIAGNOSIS — D509 Iron deficiency anemia, unspecified: Secondary | ICD-10-CM | POA: Insufficient documentation

## 2018-11-27 DIAGNOSIS — Z9049 Acquired absence of other specified parts of digestive tract: Secondary | ICD-10-CM | POA: Insufficient documentation

## 2018-11-27 DIAGNOSIS — Z85028 Personal history of other malignant neoplasm of stomach: Secondary | ICD-10-CM | POA: Diagnosis not present

## 2018-11-27 DIAGNOSIS — E538 Deficiency of other specified B group vitamins: Secondary | ICD-10-CM

## 2018-11-27 DIAGNOSIS — D500 Iron deficiency anemia secondary to blood loss (chronic): Secondary | ICD-10-CM

## 2018-11-27 DIAGNOSIS — G35 Multiple sclerosis: Secondary | ICD-10-CM | POA: Insufficient documentation

## 2018-11-27 LAB — CMP (CANCER CENTER ONLY)
ALT: 19 U/L (ref 0–44)
AST: 26 U/L (ref 15–41)
Albumin: 4.3 g/dL (ref 3.5–5.0)
Alkaline Phosphatase: 85 U/L (ref 38–126)
Anion gap: 7 (ref 5–15)
BUN: 18 mg/dL (ref 8–23)
CO2: 30 mmol/L (ref 22–32)
Calcium: 9.6 mg/dL (ref 8.9–10.3)
Chloride: 104 mmol/L (ref 98–111)
Creatinine: 0.95 mg/dL (ref 0.44–1.00)
GFR, Est AFR Am: >60 mL/min (ref >60)
GFR, Estimated: >60 mL/min (ref >60)
Glucose, Bld: 126 mg/dL — ABNORMAL HIGH (ref 70–99)
Potassium: 4.3 mmol/L (ref 3.5–5.1)
Sodium: 141 mmol/L (ref 135–145)
Total Bilirubin: 0.8 mg/dL (ref 0.3–1.2)
Total Protein: 7.7 g/dL (ref 6.5–8.1)

## 2018-11-27 LAB — CBC WITH DIFFERENTIAL (CANCER CENTER ONLY)
Abs Immature Granulocytes: 0.03 10*3/uL (ref 0.00–0.07)
Basophils Absolute: 0 10*3/uL (ref 0.0–0.1)
Basophils Relative: 1 %
Eosinophils Absolute: 0.1 10*3/uL (ref 0.0–0.5)
Eosinophils Relative: 3 %
HCT: 37.3 % (ref 36.0–46.0)
Hemoglobin: 11.9 g/dL — ABNORMAL LOW (ref 12.0–15.0)
Immature Granulocytes: 1 %
Lymphocytes Relative: 39 %
Lymphs Abs: 1.2 10*3/uL (ref 0.7–4.0)
MCH: 29.9 pg (ref 26.0–34.0)
MCHC: 31.9 g/dL (ref 30.0–36.0)
MCV: 93.7 fL (ref 80.0–100.0)
Monocytes Absolute: 0.3 10*3/uL (ref 0.1–1.0)
Monocytes Relative: 10 %
Neutro Abs: 1.4 10*3/uL — ABNORMAL LOW (ref 1.7–7.7)
Neutrophils Relative %: 46 %
Platelet Count: 165 10*3/uL (ref 150–400)
RBC: 3.98 MIL/uL (ref 3.87–5.11)
RDW: 11.9 % (ref 11.5–15.5)
WBC Count: 3 10*3/uL — ABNORMAL LOW (ref 4.0–10.5)
nRBC: 0 % (ref 0.0–0.2)

## 2018-11-27 NOTE — Progress Notes (Signed)
Hematology and Oncology Follow Up Visit  Erika Murphy [id number] November 14, 1950 68 y.o. 11/27/2018   Principle Diagnosis:   Stage II (T3N0M0) adenocarcinoma of the stomach-remission Recurrent iron deficiency anemia  Pernicious anemia Multiple sclerosis Current Therapy:    IV iron as indicated  Vitamin B-12 1 mg IM every month     Interim History:  [name] is back for follow-up.  She  seems to be doing fairly well.  Despite the coronavirus, she is staying safe.  She is looking forward to a nice Thanksgiving with her family.  Sounds like she will have quite a few selections at the dinner table.  We last saw her in July, her iron studies showed a ferritin of 23 with an iron saturation of 19%.  We did going give her a dose of IV iron at the time.  Her vitamin B12 level back in July was 1500.  She has had no problems with her multiple sclerosis and any flareups.  Her appetite seems to be doing okay.  Her weight is never that high.  She has had no nausea or vomiting.      Overall, I think that her performance status is ECOG 2.    Medications:  Current Outpatient Medications:  .  acyclovir ointment (ZOVIRAX) 5 %, Apply 1 application topically every 8 (eight) hours., Disp: 15 g, Rfl: 1 .  albuterol (PROVENTIL HFA;VENTOLIN HFA) 108 (90 Base) MCG/ACT inhaler, Inhale 2 puffs into the lungs every 6 (six) hours as needed for wheezing or shortness of breath., Disp: 1 Inhaler, Rfl: 12 .  albuterol (VENTOLIN HFA) 108 (90 Base) MCG/ACT inhaler, Inhale 2 puffs into the lungs every 6 (six) hours as needed for wheezing or shortness of breath., Disp: 8 g, Rfl: 0 .  amLODipine (NORVASC) 2.5 MG tablet, TAKE 1 TABLET BY MOUTH EVERY DAY, Disp: 90 tablet, Rfl: 3 .  cyanocobalamin (,VITAMIN B-12,) 1000 MCG/ML injection, INJECT 1 ML (1,000 MCG TOTAL) INTO THE MUSCLE ONCE A MONTH AS DIRECTED, Disp: 3 mL, Rfl: 3 .  DULoxetine (CYMBALTA) 30 MG capsule, Take 1 capsule (30 mg total) by mouth daily., Disp: 30  capsule, Rfl: 5 .  esomeprazole (NEXIUM) 40 MG capsule, TAKE 1 CAPSULE (40 MG TOTAL) BY MOUTH DAILY AT 12 NOON., Disp: 90 capsule, Rfl: 3 .  ibuprofen (ADVIL) 200 MG tablet, Take 200 mg by mouth daily as needed., Disp: , Rfl:  .  Multiple Minerals-Vitamins (NUTRA-SUPPORT BONE PO), Take 2 each by mouth daily., Disp: , Rfl:  .  Multiple Vitamins-Minerals (MULTIVITAMIN GUMMIES WOMENS PO), Take 1 each by mouth daily. , Disp: , Rfl:  .  pregabalin (LYRICA) 50 MG capsule, TAKE 1 CAPSULE BY MOUTH THREE TIMES A DAY, Disp: 90 capsule, Rfl: 5  Allergies: No Known Allergies  Past Medical History, Surgical history, Social history, and Family History were reviewed and updated.  Review of Systems: Review of Systems  Constitutional: Positive for malaise/fatigue.  HENT: Positive for tinnitus.   Eyes: Positive for blurred vision.  Respiratory: Negative.   Cardiovascular: Negative.   Gastrointestinal: Positive for diarrhea.  Genitourinary: Negative.   Musculoskeletal: Positive for myalgias.  Skin: Negative.   Neurological: Positive for tingling and weakness.  Endo/Heme/Allergies: Negative.   Psychiatric/Behavioral: Negative.     Physical Exam:  weight is 98 lb (44.5 kg). Her temporal temperature is 97.6 F (36.4 C). Her blood pressure is 124/59 (abnormal) and her pulse is 78. Her respiration is 16 and oxygen saturation is 100%.   Physical Exam  Vitals signs reviewed.  HENT:     Head: Normocephalic and atraumatic.  Eyes:     Pupils: Pupils are equal, round, and reactive to light.  Neck:     Musculoskeletal: Normal range of motion.  Cardiovascular:     Rate and Rhythm: Normal rate and regular rhythm.     Heart sounds: Normal heart sounds.  Pulmonary:     Effort: Pulmonary effort is normal.     Breath sounds: Normal breath sounds.  Abdominal:     General: Bowel sounds are normal.     Palpations: Abdomen is soft.  Musculoskeletal: Normal range of motion.        General: No tenderness or  deformity.  Lymphadenopathy:     Cervical: No cervical adenopathy.  Skin:    General: Skin is warm and dry.     Findings: No erythema or rash.  Neurological:     Mental Status: She is alert and oriented to person, place, and time.  Psychiatric:        Behavior: Behavior normal.        Thought Content: Thought content normal.        Judgment: Judgment normal.      Lab Results  Component Value Date   WBC 3.0 (L) 11/27/2018   HGB 11.9 (L) 11/27/2018   HCT 37.3 11/27/2018   MCV 93.7 11/27/2018   PLT 165 11/27/2018     Chemistry      Component Value Date/Time   NA 141 11/27/2018 1135   NA 144 06/12/2016 1151   NA 142 06/11/2015 1156   K 4.3 11/27/2018 1135   K 4.7 06/12/2016 1151   K 4.3 06/11/2015 1156   CL 104 11/27/2018 1135   CL 107 06/12/2016 1151   CO2 30 11/27/2018 1135   CO2 32 06/12/2016 1151   CO2 29 06/11/2015 1156   BUN 18 11/27/2018 1135   BUN 11 06/12/2016 1151   BUN 13.5 06/11/2015 1156   CREATININE 0.95 11/27/2018 1135   CREATININE 0.97 05/21/2018 1151   CREATININE 1.2 (H) 06/11/2015 1156      Component Value Date/Time   CALCIUM 9.6 11/27/2018 1135   CALCIUM 9.6 06/12/2016 1151   CALCIUM 9.7 06/11/2015 1156   ALKPHOS 85 11/27/2018 1135   ALKPHOS 97 (H) 06/12/2016 1151   ALKPHOS 111 06/11/2015 1156   AST 26 11/27/2018 1135   AST 30 06/11/2015 1156   ALT 19 11/27/2018 1135   ALT 26 06/12/2016 1151   ALT 23 06/11/2015 1156   BILITOT 0.8 11/27/2018 1135   BILITOT 1.03 06/11/2015 1156      Impression and Plan: Erika Murphy is a 68 year old African [place] female. She had a history of stage II stomach cancer. She underwent resection. She had adjuvant therapy. This is now about 20 years out from treatment. She is cured of the stomach cancer.  We will go ahead and plan to get her back in another 4 months.  We will see what her iron studies look like.    [name], MD 11/25/202012:55 PM

## 2018-11-29 LAB — IRON AND TIBC
Iron: 137 ug/dL (ref 41–142)
Saturation Ratios: 45 % (ref 21–57)
TIBC: 304 ug/dL (ref 236–444)
UIBC: 167 ug/dL (ref 120–384)

## 2018-11-29 LAB — FERRITIN: Ferritin: 206 ng/mL (ref 11–307)

## 2018-12-17 DIAGNOSIS — H524 Presbyopia: Secondary | ICD-10-CM | POA: Diagnosis not present

## 2018-12-17 DIAGNOSIS — H40023 Open angle with borderline findings, high risk, bilateral: Secondary | ICD-10-CM | POA: Diagnosis not present

## 2018-12-17 DIAGNOSIS — H2513 Age-related nuclear cataract, bilateral: Secondary | ICD-10-CM | POA: Diagnosis not present

## 2018-12-17 DIAGNOSIS — G35 Multiple sclerosis: Secondary | ICD-10-CM | POA: Diagnosis not present

## 2018-12-17 DIAGNOSIS — H25013 Cortical age-related cataract, bilateral: Secondary | ICD-10-CM | POA: Diagnosis not present

## 2018-12-19 ENCOUNTER — Other Ambulatory Visit: Payer: Self-pay | Admitting: Cardiology

## 2018-12-19 DIAGNOSIS — Z20822 Contact with and (suspected) exposure to covid-19: Secondary | ICD-10-CM

## 2018-12-20 LAB — NOVEL CORONAVIRUS, NAA: SARS-CoV-2, NAA: NOT DETECTED

## 2019-01-10 ENCOUNTER — Other Ambulatory Visit: Payer: Medicare Other | Admitting: Internal Medicine

## 2019-01-14 ENCOUNTER — Other Ambulatory Visit: Payer: Self-pay

## 2019-01-14 ENCOUNTER — Other Ambulatory Visit: Payer: Medicare Other | Admitting: Internal Medicine

## 2019-01-14 DIAGNOSIS — R413 Other amnesia: Secondary | ICD-10-CM | POA: Diagnosis not present

## 2019-01-14 DIAGNOSIS — E785 Hyperlipidemia, unspecified: Secondary | ICD-10-CM | POA: Diagnosis not present

## 2019-01-14 DIAGNOSIS — D649 Anemia, unspecified: Secondary | ICD-10-CM | POA: Diagnosis not present

## 2019-01-14 DIAGNOSIS — E7800 Pure hypercholesterolemia, unspecified: Secondary | ICD-10-CM | POA: Diagnosis not present

## 2019-01-14 DIAGNOSIS — I10 Essential (primary) hypertension: Secondary | ICD-10-CM | POA: Diagnosis not present

## 2019-01-14 LAB — COMPLETE METABOLIC PANEL WITH GFR
AG Ratio: 1.3 (calc) (ref 1.0–2.5)
ALT: 20 U/L (ref 6–29)
AST: 26 U/L (ref 10–35)
Albumin: 4 g/dL (ref 3.6–5.1)
Alkaline phosphatase (APISO): 97 U/L (ref 37–153)
BUN/Creatinine Ratio: 14 (calc) (ref 6–22)
BUN: 15 mg/dL (ref 7–25)
CO2: 31 mmol/L (ref 20–32)
Calcium: 9.2 mg/dL (ref 8.6–10.4)
Chloride: 106 mmol/L (ref 98–110)
Creat: 1.08 mg/dL — ABNORMAL HIGH (ref 0.60–0.93)
GFR, Est African American: 60 mL/min/{1.73_m2} (ref >=60)
GFR, Est Non African American: 52 mL/min/{1.73_m2} — ABNORMAL LOW (ref >=60)
Globulin: 3 g/dL (calc) (ref 1.9–3.7)
Glucose, Bld: 90 mg/dL (ref 65–99)
Potassium: 4.5 mmol/L (ref 3.5–5.3)
Sodium: 141 mmol/L (ref 135–146)
Total Bilirubin: 0.7 mg/dL (ref 0.2–1.2)
Total Protein: 7 g/dL (ref 6.1–8.1)

## 2019-01-14 LAB — CBC WITH DIFFERENTIAL/PLATELET
Absolute Monocytes: 372 cells/uL (ref 200–950)
Basophils Absolute: 19 cells/uL (ref 0–200)
Basophils Relative: 0.6 %
Eosinophils Absolute: 90 cells/uL (ref 15–500)
Eosinophils Relative: 2.9 %
HCT: 36 % (ref 35.0–45.0)
Hemoglobin: 11.8 g/dL (ref 11.7–15.5)
Lymphs Abs: 1178 cells/uL (ref 850–3900)
MCH: 29.9 pg (ref 27.0–33.0)
MCHC: 32.8 g/dL (ref 32.0–36.0)
MCV: 91.4 fL (ref 80.0–100.0)
MPV: 11.2 fL (ref 7.5–12.5)
Monocytes Relative: 12 %
Neutro Abs: 1442 cells/uL — ABNORMAL LOW (ref 1500–7800)
Neutrophils Relative %: 46.5 %
Platelets: 174 10*3/uL (ref 140–400)
RBC: 3.94 10*6/uL (ref 3.80–5.10)
RDW: 11.4 % (ref 11.0–15.0)
Total Lymphocyte: 38 %
WBC: 3.1 10*3/uL — ABNORMAL LOW (ref 3.8–10.8)

## 2019-01-14 LAB — TSH: TSH: 1.33 mIU/L (ref 0.40–4.50)

## 2019-01-14 LAB — LIPID PANEL
Cholesterol: 214 mg/dL — ABNORMAL HIGH (ref <200)
HDL: 54 mg/dL (ref >=50)
LDL Cholesterol (Calc): 136 mg/dL (calc) — ABNORMAL HIGH
Non-HDL Cholesterol (Calc): 160 mg/dL (calc) — ABNORMAL HIGH (ref <130)
Total CHOL/HDL Ratio: 4 (calc) (ref <5.0)
Triglycerides: 120 mg/dL (ref <150)

## 2019-01-17 ENCOUNTER — Other Ambulatory Visit: Payer: Self-pay

## 2019-01-17 ENCOUNTER — Ambulatory Visit (INDEPENDENT_AMBULATORY_CARE_PROVIDER_SITE_OTHER): Payer: Medicare Other | Admitting: Internal Medicine

## 2019-01-17 ENCOUNTER — Encounter: Payer: Self-pay | Admitting: Internal Medicine

## 2019-01-17 VITALS — BP 130/70 | HR 74 | Temp 98.0°F | Ht 65.0 in | Wt 100.0 lb

## 2019-01-17 DIAGNOSIS — I10 Essential (primary) hypertension: Secondary | ICD-10-CM

## 2019-01-17 DIAGNOSIS — R8290 Unspecified abnormal findings in urine: Secondary | ICD-10-CM

## 2019-01-17 DIAGNOSIS — Z0000 Encounter for general adult medical examination without abnormal findings: Secondary | ICD-10-CM

## 2019-01-17 DIAGNOSIS — Z903 Acquired absence of stomach [part of]: Secondary | ICD-10-CM

## 2019-01-17 DIAGNOSIS — E538 Deficiency of other specified B group vitamins: Secondary | ICD-10-CM

## 2019-01-17 DIAGNOSIS — E7800 Pure hypercholesterolemia, unspecified: Secondary | ICD-10-CM

## 2019-01-17 DIAGNOSIS — Z85028 Personal history of other malignant neoplasm of stomach: Secondary | ICD-10-CM | POA: Diagnosis not present

## 2019-01-17 DIAGNOSIS — R413 Other amnesia: Secondary | ICD-10-CM | POA: Diagnosis not present

## 2019-01-17 DIAGNOSIS — G35 Multiple sclerosis: Secondary | ICD-10-CM

## 2019-01-17 DIAGNOSIS — N319 Neuromuscular dysfunction of bladder, unspecified: Secondary | ICD-10-CM

## 2019-01-17 LAB — POCT URINALYSIS DIPSTICK
Bilirubin, UA: NEGATIVE
Blood, UA: NEGATIVE
Glucose, UA: NEGATIVE
Ketones, UA: NEGATIVE
Nitrite, UA: NEGATIVE
Protein, UA: NEGATIVE
Spec Grav, UA: 1.01 (ref 1.010–1.025)
Urobilinogen, UA: 0.2 E.U./dL
pH, UA: 6 (ref 5.0–8.0)

## 2019-01-17 MED ORDER — DONEPEZIL HCL 5 MG PO TABS: ORAL_TABLET | ORAL | 11 refills | Status: DC

## 2019-01-17 NOTE — Patient Instructions (Signed)
It was a pleasure to see you today. Try to keep weight up, watch fatty foods and return in 6 months.

## 2019-01-17 NOTE — Progress Notes (Signed)
Subjective:    Patient ID: Erika Murphy, female    DOB: 26-Dec-1950, 69 y.o.   MRN: [id number]  HPI 69 year old Female for health maintenance exam, Medicare wellness, and evaluation of medical issues. Has gained 4 pounds since October.  Maintaining her weight has been an issue since surgery for carcinoma of the stomach i.e. subtotal gastrectomy and partial omentectomy in 2001.  History of H. pylori infection in 2006  History of osteopenia  No known drug allergies  History of multiple sclerosis diagnosed in 1998.  She falls sometimes.  History of spastic gait disorder with lower distal weakness and proximal weakness.  She used to be followed by Dr. Erling Cruz for 50 retired and he diagnosed her with multiple sclerosis.  She took Betaseron for a while but did not like the way it made her feel.  Ambulates with a cane due to gait disorder.  History of neuropathic pain involving upper and lower extremities treated with Lyrica  History of left shoulder arthroplasty by Dr. Veverly Fells in 2011.  History of vitamin B12 deficiency due to gastrectomy which she had due to gastric cancer.  A family member gives her monthly B12 injections.  She has had some memory issues in the past.  An MRI of the brain without contrast in January 2018 was stable compared to 2015.  In 2016 she had right total hip arthroplasty for right femoral fracture.  She is followed by Dr. Benedict Needy oncologist for history of gastric cancer.  She has never driven a car and always took the bus to work.  Sometimes has urinary tract infection secondary to neurogenic bladder from multiple sclerosis.  Had  colonoscopy 2019 and endoscopy by Dr. Patrice Paradise with no evidence of recurrent cancer.  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  She is now disabled.  Does not smoke or consume alcohol.  Family history: 1 sister died status post leg amputation with history of diabetes.   Sister diagnosed with breast cancer.  Brother died with metastatic cancer.   Review of Systems today feels well with no new issues     Objective:   Physical Exam Weight 100 pounds.  Blood pressure 130/70, pulse 74 temperature 98 degrees pulse oximetry 99% height 5 feet 5 inches BMI 16.64.  She looks thin.  Skin warm and dry.  Nodes none.  TMs and pharynx clear.  Neck is supple without JVD thyromegaly or carotid bruits.  Chest clear to auscultation.  Cardiac exam regular rate and rhythm normal S1 and S2 without murmurs or gallops.  Bimanual is normal.  Pap not taken due to age.  Neuro she is alert and oriented with normal mood and affect.  No cranial nerve deficit noted.  Ambulates with a cane and has gait disorder.       Assessment & Plan:  Elevated LDL- continue to monitor as she did not want to be on statin medication  Hypertension treated with amlodipine 2.5 mg daily  Insomnia treated with Valium 2 mg at bedtime  Memory loss treated with Aricept 5 mg daily  Musculoskeletal pain secondary to multiple sclerosis treated with Cymbalta 30 mg daily and Lyrica 50 mg 3 times a day  GE reflux treated with Nexium 40 mg daily  Plan: Continue current medications and follow-up in 6 months.  Weigh self weekly and let me know if losing weight.  Subjective:   Patient presents for Medicare Annual/Subsequent preventive examination.  Review Past Medical/Family/Social: See above   Risk Factors  Current exercise habits: Mostly sedentary-ambulates with a cane Dietary issues discussed: Needs to get insufficient calories on a daily basis  Cardiac risk factors: Elevated LDL but does not want to be on statin and family history of heart disease in mother  Depression Screen  (Note: if answer to either of the following is "Yes", a more complete depression screening is indicated)   Over the past two weeks, have you felt down, depressed or hopeless?  Yes sometimes Over the past two weeks, have you  felt little interest or pleasure in doing things? No Have you lost interest or pleasure in daily life? No Do you often feel hopeless? No Do you cry easily over simple problems? No   Activities of Daily Living  In your present state of health, do you have any difficulty performing the following activities?:   Driving?  Does not drive Managing money? No  Feeding yourself? No  Getting from bed to chair? No  Climbing a flight of stairs?  Yes Preparing food and eating?: No  Bathing or showering? No  Getting dressed: No  Getting to the toilet? No  Using the toilet:No  Moving around from place to place: Sometimes In the past year have you fallen or had a near fall?:No  Are you sexually active? No  Do you have more than one partner? No   Hearing Difficulties: No  Do you often ask people to speak up or repeat themselves? No  Do you experience ringing or noises in your ears? No  Do you have difficulty understanding soft or whispered voices? No  Do you feel that you have a problem with memory?  Yes sometimes Do you often misplace items? No    Home Safety:  Do you have a smoke alarm at your residence? Yes Do you have grab bars in the bathroom?  Yes Do you have throw rugs in your house?  None   Cognitive Testing  Alert? Yes Normal Appearance?Yes  Oriented to person? Yes Place? Yes  Time? Yes  Recall of three objects?  Not tested Can perform simple calculations? Yes  Displays appropriate judgment?Yes  Can read the correct time from a watch face?Yes   List the Names of Other Physician/Practitioners you currently use:  See referral list for the physicians patient is currently seeing.  Dr. Marin Olp   Review of Systems: See above   Objective:     General appearance: Appears younger than stated age and somewhat frail Head: Normocephalic, without obvious abnormality, atraumatic  Eyes: conj clear, EOMi PEERLA  Ears: normal TM's and external ear canals both ears  Nose: Nares  normal. Septum midline. Mucosa normal. No drainage or sinus tenderness.  Throat: lips, mucosa, and tongue normal; teeth and gums normal  Neck: no adenopathy, no carotid bruit, no JVD, supple, symmetrical, trachea midline and thyroid not enlarged, symmetric, no tenderness/mass/nodules  No CVA tenderness.  Lungs: clear to auscultation bilaterally  Breasts: normal appearance, no masses or tenderness, top of the pacemaker on left upper chest. Incision well-healed. It is tender.  Heart: regular rate and rhythm, S1, S2 normal, no murmur, click, rub or gallop  Abdomen: soft, non-tender; bowel sounds normal; no masses, no organomegaly  Musculoskeletal: ROM normal in all joints, no crepitus, no deformity, Normal muscle strengthen. Back  is symmetric, no curvature. Skin: Skin color, texture, turgor normal. No rashes or lesions  Lymph nodes: Cervical, supraclavicular, and axillary nodes normal.  Neurologic: CN 2 -12  Normal, gait disorder from multiple sclerosis Psych: Alert & Oriented x 3, Mood appear stable.    Assessment:    Annual wellness medicare exam   Plan:    During the course of the visit the patient was educated and counseled about appropriate screening and preventive services including:   Annual mammogram  Recommend COVID-19 vaccine when available     Patient Instructions (the written plan) was given to the patient.  Medicare Attestation  I have personally reviewed:  The patient's medical and social history  Their use of alcohol, tobacco or illicit drugs  Their current medications and supplements  The patient's functional ability including ADLs,fall risks, home safety risks, cognitive, and hearing and visual impairment  Diet and physical activities  Evidence for depression or mood disorders  The patient's weight, height, BMI, and visual acuity have been recorded in the chart. I have made referrals, counseling, and provided education to the patient based on review of the above  and I have provided the patient with a written personalized care plan for preventive services.

## 2019-01-18 LAB — URINE CULTURE
MICRO NUMBER:: 10046970
SPECIMEN QUALITY:: ADEQUATE

## 2019-01-18 LAB — URINALYSIS, MICROSCOPIC ONLY
Bacteria, UA: NONE SEEN /HPF
Hyaline Cast: NONE SEEN /LPF
RBC / HPF: NONE SEEN /HPF (ref 0–2)

## 2019-02-06 ENCOUNTER — Other Ambulatory Visit: Payer: Self-pay | Admitting: Internal Medicine

## 2019-02-06 MED ORDER — DIAZEPAM 2 MG PO TABS: ORAL_TABLET | ORAL | 1 refills | Status: DC

## 2019-02-06 NOTE — Telephone Encounter (Signed)
Please advise 

## 2019-02-06 NOTE — Telephone Encounter (Signed)
Erika Murphy [id number]  [name] called to say that she is having trouble sleeping since her boyfriend of 20 years passed away suddenly 2 weeks ago and she is wandering if you could call some of the below listed medication in.   diazepam (VALIUM) tablet 2 mg   [id number] [address], [place] Phone:  [phone]  Fax:  [phone]

## 2019-02-06 NOTE — Telephone Encounter (Signed)
Pend this  Rx Valium 2 mg Sig:one po qhs #30 with one refill and I can sign

## 2019-02-20 ENCOUNTER — Other Ambulatory Visit: Payer: Self-pay | Admitting: Internal Medicine

## 2019-03-02 ENCOUNTER — Ambulatory Visit: Payer: Medicare Other | Attending: Internal Medicine

## 2019-03-02 DIAGNOSIS — Z23 Encounter for immunization: Secondary | ICD-10-CM | POA: Insufficient documentation

## 2019-03-02 NOTE — Progress Notes (Signed)
   Covid-19 Vaccination Clinic  Name:  Erika Murphy    MRN: [id number] DOB: Dec 12, 1950  03/02/2019  [name] was observed post Covid-19 immunization for 15 minutes without incidence. She was provided with Vaccine Information Sheet and instruction to access the V-Safe system.   [name] was instructed to call 911 with any severe reactions post vaccine: Marland Kitchen Difficulty breathing  . Swelling of your face and throat  . A fast heartbeat  . A bad rash all over your body  . Dizziness and weakness    Immunizations Administered    Name Date Dose VIS Date Route   Pfizer COVID-19 Vaccine 03/02/2019  1:07 PM 0.3 mL 12/13/2018 Intramuscular   Manufacturer: [place]   Lot: [id number]   [place]: [id number]

## 2019-03-11 ENCOUNTER — Telehealth: Payer: Self-pay

## 2019-03-11 ENCOUNTER — Other Ambulatory Visit: Payer: Self-pay

## 2019-03-11 MED ORDER — ALBUTEROL SULFATE HFA 108 (90 BASE) MCG/ACT IN AERS: INHALATION_SPRAY | RESPIRATORY_TRACT | 11 refills | Status: DC

## 2019-03-11 NOTE — Telephone Encounter (Signed)
Can she come tomorrow?

## 2019-03-11 NOTE — Telephone Encounter (Signed)
Scheduled appointment

## 2019-03-11 NOTE — Telephone Encounter (Signed)
Patient called states she has some shortness of breath she this this has been going for  While for she feels like it's worse today. She said she has been out of her inhaler but she is going to pick it today form her pharmacy, she said she had some chest pain on her left side but it moved to her shoulder blades. No headache, nausea, dizziness or vomiting no fever. She wants to know if she can be seen?

## 2019-03-12 ENCOUNTER — Ambulatory Visit (INDEPENDENT_AMBULATORY_CARE_PROVIDER_SITE_OTHER): Payer: Medicare Other | Admitting: Internal Medicine

## 2019-03-12 ENCOUNTER — Other Ambulatory Visit: Payer: Self-pay

## 2019-03-12 ENCOUNTER — Encounter: Payer: Self-pay | Admitting: Internal Medicine

## 2019-03-12 VITALS — BP 120/80 | HR 74 | Temp 98.0°F | Ht 65.0 in | Wt 98.0 lb

## 2019-03-12 DIAGNOSIS — J4520 Mild intermittent asthma, uncomplicated: Secondary | ICD-10-CM | POA: Diagnosis not present

## 2019-03-12 DIAGNOSIS — G35 Multiple sclerosis: Secondary | ICD-10-CM

## 2019-03-12 DIAGNOSIS — M898X1 Other specified disorders of bone, shoulder: Secondary | ICD-10-CM

## 2019-03-12 DIAGNOSIS — R0602 Shortness of breath: Secondary | ICD-10-CM | POA: Diagnosis not present

## 2019-03-12 MED ORDER — AZITHROMYCIN 250 MG PO TABS: ORAL_TABLET | ORAL | 0 refills | Status: DC

## 2019-03-12 MED ORDER — ROSUVASTATIN CALCIUM 5 MG PO TABS: ORAL_TABLET | ORAL | 3 refills | Status: DC

## 2019-03-12 MED ORDER — PREDNISONE 10 MG PO TABS: ORAL_TABLET | ORAL | 0 refills | Status: DC

## 2019-03-12 NOTE — Progress Notes (Signed)
   Subjective:    Patient ID: Erika Murphy, female    DOB: 1950-05-26, 69 y.o.   MRN: [id number]  HPI 69 year old Female with Hx of gastric cancer and multiple sclerosis presents with complaint of left scapular pain and shortness of breath.  Denies significant anxiety.  She is worried about the shortness of breath.  She is frail  EKG shows sinus rhythm with no change from previous EKG January 2019.  2 months ago labs were stable with hemoglobin 11.8 g.  Having repeat studies in late March.  Shortness of breath could be due to mild iron deficiency, deconditioning or another issue.  Review of Systems no fever or chills.  Slight cough.  Slight wheezing she says- does not have inhaler.  History of iron deficiency anemia due to gastric surgery.  She is followed by Dr. Marin Olp.  Also has B12 deficiency from gastric surgery.  She gets family member to give her B12 injections monthly.  She gets IV iron as needed.     Objective:   Physical Exam Blood pressure 120/80 pulse 74 temperature 98 degrees orally pulse oximetry 99% weight 98 pounds BMI 16.31 Appears thin and frail but talkative and alert.  Skin warm and dry.  No cervical adenopathy.  No carotid bruits.  Chest clear to auscultation.  Cardiac exam regular rate and rhythm normal S1 and S2 abdomen soft nondistended without hepatosplenomegaly masses or tenderness.  No lower extremity edema. Some point tenderness along left parascapular area     Assessment & Plan:  Complaint of shortness of breath-she is not hypoxic and EKG is within normal limits.  My feeling is she could be mildly iron deficient but has upcoming appointment with Dr. Marin Olp, she could possibly have a lower respiratory infection with reactive airways disease, likely has deconditioning and she has generalized weakness from multiple sclerosis, could have anxiety  Left scapular para musculoskeletal planning  Plan: In January she had total cholesterol 219 with an LDL of 136.  I am  starting her on Crestor 5 mg a week with supper for hyperlipidemia.  I am prescribing the Ventolin inhaler 2 sprays every 4 hours as needed for shortness of breath.  She will call if not improving.  Also she has Valium 2 mg at bedtime as needed for anxiety and sleep.  She is also on Aricept for memory issues.  She will follow-up in July with lipid panel liver functions and office visit.

## 2019-03-27 ENCOUNTER — Inpatient Hospital Stay: Payer: Medicare Other | Attending: Hematology & Oncology

## 2019-03-27 ENCOUNTER — Inpatient Hospital Stay (HOSPITAL_BASED_OUTPATIENT_CLINIC_OR_DEPARTMENT_OTHER): Payer: Medicare Other | Admitting: Hematology & Oncology

## 2019-03-27 ENCOUNTER — Encounter: Payer: Self-pay | Admitting: Hematology & Oncology

## 2019-03-27 ENCOUNTER — Other Ambulatory Visit: Payer: Self-pay

## 2019-03-27 VITALS — BP 144/90 | HR 90 | Temp 97.8°F | Resp 18 | Ht 65.0 in | Wt 96.8 lb

## 2019-03-27 DIAGNOSIS — D500 Iron deficiency anemia secondary to blood loss (chronic): Secondary | ICD-10-CM | POA: Diagnosis not present

## 2019-03-27 DIAGNOSIS — D510 Vitamin B12 deficiency anemia due to intrinsic factor deficiency: Secondary | ICD-10-CM | POA: Diagnosis not present

## 2019-03-27 DIAGNOSIS — E538 Deficiency of other specified B group vitamins: Secondary | ICD-10-CM

## 2019-03-27 DIAGNOSIS — Z85028 Personal history of other malignant neoplasm of stomach: Secondary | ICD-10-CM | POA: Insufficient documentation

## 2019-03-27 DIAGNOSIS — Z79899 Other long term (current) drug therapy: Secondary | ICD-10-CM | POA: Diagnosis not present

## 2019-03-27 DIAGNOSIS — D509 Iron deficiency anemia, unspecified: Secondary | ICD-10-CM | POA: Insufficient documentation

## 2019-03-27 DIAGNOSIS — G35 Multiple sclerosis: Secondary | ICD-10-CM | POA: Insufficient documentation

## 2019-03-27 LAB — CMP (CANCER CENTER ONLY)
ALT: 22 U/L (ref 0–44)
AST: 29 U/L (ref 15–41)
Albumin: 4.2 g/dL (ref 3.5–5.0)
Alkaline Phosphatase: 99 U/L (ref 38–126)
Anion gap: 9 (ref 5–15)
BUN: 18 mg/dL (ref 8–23)
CO2: 29 mmol/L (ref 22–32)
Calcium: 9.8 mg/dL (ref 8.9–10.3)
Chloride: 102 mmol/L (ref 98–111)
Creatinine: 1.16 mg/dL — ABNORMAL HIGH (ref 0.44–1.00)
GFR, Est AFR Am: 55 mL/min — ABNORMAL LOW (ref >60)
GFR, Estimated: 48 mL/min — ABNORMAL LOW (ref >60)
Glucose, Bld: 119 mg/dL — ABNORMAL HIGH (ref 70–99)
Potassium: 3.8 mmol/L (ref 3.5–5.1)
Sodium: 140 mmol/L (ref 135–145)
Total Bilirubin: 0.9 mg/dL (ref 0.3–1.2)
Total Protein: 7.7 g/dL (ref 6.5–8.1)

## 2019-03-27 LAB — CBC WITH DIFFERENTIAL (CANCER CENTER ONLY)
Abs Immature Granulocytes: 0.05 10*3/uL (ref 0.00–0.07)
Basophils Absolute: 0 10*3/uL (ref 0.0–0.1)
Basophils Relative: 1 %
Eosinophils Absolute: 0.1 10*3/uL (ref 0.0–0.5)
Eosinophils Relative: 1 %
HCT: 39.6 % (ref 36.0–46.0)
Hemoglobin: 12.5 g/dL (ref 12.0–15.0)
Immature Granulocytes: 1 %
Lymphocytes Relative: 24 %
Lymphs Abs: 1 10*3/uL (ref 0.7–4.0)
MCH: 29.8 pg (ref 26.0–34.0)
MCHC: 31.6 g/dL (ref 30.0–36.0)
MCV: 94.5 fL (ref 80.0–100.0)
Monocytes Absolute: 0.6 10*3/uL (ref 0.1–1.0)
Monocytes Relative: 14 %
Neutro Abs: 2.5 10*3/uL (ref 1.7–7.7)
Neutrophils Relative %: 59 %
Platelet Count: 152 10*3/uL (ref 150–400)
RBC: 4.19 MIL/uL (ref 3.87–5.11)
RDW: 12.4 % (ref 11.5–15.5)
WBC Count: 4.2 10*3/uL (ref 4.0–10.5)
nRBC: 0 % (ref 0.0–0.2)

## 2019-03-27 LAB — VITAMIN B12: Vitamin B-12: 656 pg/mL (ref 180–914)

## 2019-03-27 LAB — RETICULOCYTES
Immature Retic Fract: 4.7 % (ref 2.3–15.9)
RBC.: 4.13 MIL/uL (ref 3.87–5.11)
Retic Count, Absolute: 39.6 10*3/uL (ref 19.0–186.0)
Retic Ct Pct: 1 % (ref 0.4–3.1)

## 2019-03-27 NOTE — Progress Notes (Signed)
Hematology and Oncology Follow Up Visit  Erika Murphy [id number] 08-11-1950 69 y.o. 03/27/2019   Principle Diagnosis:   Stage II (T3N0M0) adenocarcinoma of the stomach-remission Recurrent iron deficiency anemia  Pernicious anemia Multiple sclerosis Current Therapy:    IV iron as indicated  Vitamin B-12 1 mg IM every month     Interim History:  [name] is back for follow-up.  Overall, she is doing quite well.  There has been some deaths in her family from the coronavirus.  She has had her coronavirus vaccine already.  I think she gets her next 1 next week.  She has been healthy overall.  She has had no problems with the multiple sclerosis flaring up on her.  When we last saw her, her ferritin was 206 with iron saturation of 45%.  She has had no problems with nausea or vomiting.  She has had no change in bowel or bladder habits.  Her last vitamin B12 level was 1400 when we saw her.  She overall, I think that her performance status is ECOG 2.    Medications:  Current Outpatient Medications:  .  acyclovir ointment (ZOVIRAX) 5 %, Apply 1 application topically every 8 (eight) hours., Disp: 15 g, Rfl: 1 .  albuterol (VENTOLIN HFA) 108 (90 Base) MCG/ACT inhaler, TAKE 2 PUFFS BY MOUTH EVERY 6 HOURS AS NEEDED FOR WHEEZE OR SHORTNESS OF BREATH, Disp: 8 g, Rfl: 11 .  amLODipine (NORVASC) 2.5 MG tablet, TAKE 1 TABLET BY MOUTH EVERY DAY, Disp: 90 tablet, Rfl: 3 .  cyanocobalamin (,VITAMIN B-12,) 1000 MCG/ML injection, INJECT 1 ML (1,000 MCG TOTAL) INTO THE MUSCLE ONCE A MONTH AS DIRECTED, Disp: 3 mL, Rfl: 3 .  diazepam (VALIUM) 2 MG tablet, Take one tablet by mouth daily at bedtime., Disp: 30 tablet, Rfl: 1 .  DULoxetine (CYMBALTA) 30 MG capsule, Take 1 capsule (30 mg total) by mouth daily., Disp: 30 capsule, Rfl: 5 .  esomeprazole (NEXIUM) 40 MG capsule, TAKE 1 CAPSULE (40 MG TOTAL) BY MOUTH DAILY AT 12 NOON., Disp: 90 capsule, Rfl: 3 .  ibuprofen (ADVIL) 200 MG tablet, Take 200 mg by  mouth daily as needed., Disp: , Rfl:  .  Multiple Vitamins-Minerals (MULTIVITAMIN GUMMIES WOMENS PO), Take 1 each by mouth daily. , Disp: , Rfl:  .  pregabalin (LYRICA) 50 MG capsule, TAKE 1 CAPSULE BY MOUTH THREE TIMES A DAY, Disp: 90 capsule, Rfl: 5 .  rosuvastatin (CRESTOR) 5 MG tablet, One po weekly with supper for hypercholesterolemia, Disp: 12 tablet, Rfl: 3 .  donepezil (ARICEPT) 5 MG tablet, One po daily for memory issues (Patient not taking: Reported on 03/12/2019), Disp: 30 tablet, Rfl: 11  Allergies: No Known Allergies  Past Medical History, Surgical history, Social history, and Family History were reviewed and updated.  Review of Systems: Review of Systems  Constitutional: Positive for malaise/fatigue.  HENT: Positive for tinnitus.   Eyes: Positive for blurred vision.  Respiratory: Negative.   Cardiovascular: Negative.   Gastrointestinal: Positive for diarrhea.  Genitourinary: Negative.   Musculoskeletal: Positive for myalgias.  Skin: Negative.   Neurological: Positive for tingling and weakness.  Endo/Heme/Allergies: Negative.   Psychiatric/Behavioral: Negative.     Physical Exam:  height is 5\' 5"  (1.651 m) and weight is 96 lb 12.8 oz (43.9 kg). Her temporal temperature is 97.8 F (36.6 C). Her blood pressure is 144/90 (abnormal) and her pulse is 90. Her respiration is 18 and oxygen saturation is 100%.   Physical Exam Vitals reviewed.  HENT:     Head: Normocephalic and atraumatic.  Eyes:     Pupils: Pupils are equal, round, and reactive to light.  Cardiovascular:     Rate and Rhythm: Normal rate and regular rhythm.     Heart sounds: Normal heart sounds.  Pulmonary:     Effort: Pulmonary effort is normal.     Breath sounds: Normal breath sounds.  Abdominal:     General: Bowel sounds are normal.     Palpations: Abdomen is soft.  Musculoskeletal:        General: No tenderness or deformity. Normal range of motion.     Cervical back: Normal range of motion.   Lymphadenopathy:     Cervical: No cervical adenopathy.  Skin:    General: Skin is warm and dry.     Findings: No erythema or rash.  Neurological:     Mental Status: She is alert and oriented to person, place, and time.  Psychiatric:        Behavior: Behavior normal.        Thought Content: Thought content normal.        Judgment: Judgment normal.      Lab Results  Component Value Date   WBC 4.2 03/27/2019   HGB 12.5 03/27/2019   HCT 39.6 03/27/2019   MCV 94.5 03/27/2019   PLT 152 03/27/2019     Chemistry      Component Value Date/Time   NA 140 03/27/2019 1144   NA 144 06/12/2016 1151   NA 142 06/11/2015 1156   K 3.8 03/27/2019 1144   K 4.7 06/12/2016 1151   K 4.3 06/11/2015 1156   CL 102 03/27/2019 1144   CL 107 06/12/2016 1151   CO2 29 03/27/2019 1144   CO2 32 06/12/2016 1151   CO2 29 06/11/2015 1156   BUN 18 03/27/2019 1144   BUN 11 06/12/2016 1151   BUN 13.5 06/11/2015 1156   CREATININE 1.16 (H) 03/27/2019 1144   CREATININE 1.08 (H) 01/14/2019 0906   CREATININE 1.2 (H) 06/11/2015 1156      Component Value Date/Time   CALCIUM 9.8 03/27/2019 1144   CALCIUM 9.6 06/12/2016 1151   CALCIUM 9.7 06/11/2015 1156   ALKPHOS 99 03/27/2019 1144   ALKPHOS 97 (H) 06/12/2016 1151   ALKPHOS 111 06/11/2015 1156   AST 29 03/27/2019 1144   AST 30 06/11/2015 1156   ALT 22 03/27/2019 1144   ALT 26 06/12/2016 1151   ALT 23 06/11/2015 1156   BILITOT 0.9 03/27/2019 1144   BILITOT 1.03 06/11/2015 1156      Impression and Plan: Erika Murphy is a 69 year old [place] female. She had a history of stage II stomach cancer. She underwent resection. She had adjuvant therapy. This is now about 20 years out from treatment. She is cured of the stomach cancer.   She has occasional iron deficiency because of malabsorption.  We give her IV iron when necessary.  I would not think that she would need it today given her hemoglobin and her MCV.  She does get the vitamin B12.  I am  pretty sure this is done by her family doctor.  We will go ahead and plan to get her back in another 4 months.    [name], MD 3/25/202112:53 PM

## 2019-03-28 LAB — IRON AND TIBC
Iron: 56 ug/dL (ref 41–142)
Saturation Ratios: 18 % — ABNORMAL LOW (ref 21–57)
TIBC: 313 ug/dL (ref 236–444)
UIBC: 257 ug/dL (ref 120–384)

## 2019-03-28 LAB — FERRITIN: Ferritin: 240 ng/mL (ref 11–307)

## 2019-03-31 ENCOUNTER — Other Ambulatory Visit: Payer: Self-pay | Admitting: Family

## 2019-03-31 ENCOUNTER — Ambulatory Visit: Payer: Medicare Other | Attending: Internal Medicine

## 2019-03-31 ENCOUNTER — Ambulatory Visit: Payer: Medicare Other

## 2019-03-31 DIAGNOSIS — Z23 Encounter for immunization: Secondary | ICD-10-CM

## 2019-03-31 NOTE — Progress Notes (Signed)
   Covid-19 Vaccination Clinic  Name:  Erika Murphy    MRN: [id number] DOB: 28-Mar-1950  03/31/2019  [name] was observed post Covid-19 immunization for 15 minutes without incident. She was provided with Vaccine Information Sheet and instruction to access the V-Safe system.   [name] was instructed to call 911 with any severe reactions post vaccine: Marland Kitchen Difficulty breathing  . Swelling of face and throat  . A fast heartbeat  . A bad rash all over body  . Dizziness and weakness   Immunizations Administered    Name Date Dose VIS Date Route   Pfizer COVID-19 Vaccine 03/31/2019 11:02 AM 0.3 mL 12/13/2018 Intramuscular   Manufacturer: [place]   Lot: [id number]   [place]: [id number]

## 2019-04-02 NOTE — Patient Instructions (Addendum)
Take Crestor 5 mg a week.  Use Ventolin inhaler 2 sprays every 4 hours as needed for shortness of breath.  Follow-up with Dr. Marin Olp regarding iron deficiency.  Return in July for 23-month follow-up of lipid panel and liver functions

## 2019-04-03 ENCOUNTER — Inpatient Hospital Stay: Payer: Medicare Other | Attending: Hematology & Oncology

## 2019-04-03 ENCOUNTER — Other Ambulatory Visit: Payer: Self-pay

## 2019-04-03 VITALS — BP 114/50 | HR 70

## 2019-04-03 DIAGNOSIS — D508 Other iron deficiency anemias: Secondary | ICD-10-CM | POA: Diagnosis not present

## 2019-04-03 DIAGNOSIS — D500 Iron deficiency anemia secondary to blood loss (chronic): Secondary | ICD-10-CM

## 2019-04-03 DIAGNOSIS — K909 Intestinal malabsorption, unspecified: Secondary | ICD-10-CM | POA: Insufficient documentation

## 2019-04-03 MED ORDER — SODIUM CHLORIDE 0.9 % IV SOLN
Freq: Once | INTRAVENOUS | Status: AC
  Filled 2019-04-03: qty 250

## 2019-04-03 MED ORDER — SODIUM CHLORIDE 0.9 % IV SOLN
200.0000 mg | Freq: Once | INTRAVENOUS | Status: AC
  Administered 2019-04-03: 200 mg via INTRAVENOUS
  Filled 2019-04-03: qty 200

## 2019-04-03 NOTE — Patient Instructions (Signed)

## 2019-04-24 DIAGNOSIS — Z96649 Presence of unspecified artificial hip joint: Secondary | ICD-10-CM | POA: Insufficient documentation

## 2019-04-29 DIAGNOSIS — M545 Low back pain: Secondary | ICD-10-CM | POA: Diagnosis not present

## 2019-04-29 DIAGNOSIS — Z471 Aftercare following joint replacement surgery: Secondary | ICD-10-CM | POA: Diagnosis not present

## 2019-04-29 DIAGNOSIS — Z96641 Presence of right artificial hip joint: Secondary | ICD-10-CM | POA: Diagnosis not present

## 2019-05-01 ENCOUNTER — Other Ambulatory Visit: Payer: Self-pay | Admitting: Internal Medicine

## 2019-05-09 DIAGNOSIS — M545 Low back pain: Secondary | ICD-10-CM | POA: Diagnosis not present

## 2019-05-16 DIAGNOSIS — Z96641 Presence of right artificial hip joint: Secondary | ICD-10-CM | POA: Diagnosis not present

## 2019-05-16 DIAGNOSIS — M545 Low back pain: Secondary | ICD-10-CM | POA: Diagnosis not present

## 2019-05-26 ENCOUNTER — Telehealth: Payer: Self-pay | Admitting: Internal Medicine

## 2019-05-26 DIAGNOSIS — M4696 Unspecified inflammatory spondylopathy, lumbar region: Secondary | ICD-10-CM | POA: Diagnosis not present

## 2019-05-26 DIAGNOSIS — M47816 Spondylosis without myelopathy or radiculopathy, lumbar region: Secondary | ICD-10-CM | POA: Insufficient documentation

## 2019-05-26 DIAGNOSIS — M545 Low back pain: Secondary | ICD-10-CM | POA: Diagnosis not present

## 2019-05-26 NOTE — Telephone Encounter (Signed)
Pt called about needing some advice and or look up her test from last week MRI pt went to see the ortho surgeon and he told her no need for surgery. Pt has arthritis in her back so that doctor referred her to a specialist Dr Rolena Infante. But pt is in constant pain. She cannot sleep it has gotten worse. Dr Rolena Infante did not order anything due to her having 55 percent of her stomach removed. Please advise and Thank you!  Call pt @ [phone].

## 2019-05-26 NOTE — Telephone Encounter (Signed)
Schedule OV next week

## 2019-05-27 NOTE — Telephone Encounter (Signed)
Left message to call back  

## 2019-05-27 NOTE — Telephone Encounter (Signed)
Scheduled

## 2019-05-29 DIAGNOSIS — M5416 Radiculopathy, lumbar region: Secondary | ICD-10-CM | POA: Diagnosis not present

## 2019-06-03 ENCOUNTER — Telehealth: Payer: Self-pay

## 2019-06-03 NOTE — Telephone Encounter (Signed)
Patient cancelled her appointment going to [facility]

## 2019-06-09 ENCOUNTER — Ambulatory Visit: Payer: Medicare Other | Admitting: Internal Medicine

## 2019-06-09 ENCOUNTER — Telehealth: Payer: Self-pay | Admitting: Internal Medicine

## 2019-06-09 NOTE — Telephone Encounter (Signed)
Erika Murphy is going to drop off FMLA paperwork for me to look over for her daughter to bring her to appointments.

## 2019-06-10 ENCOUNTER — Telehealth: Payer: Self-pay | Admitting: Internal Medicine

## 2019-06-10 NOTE — Telephone Encounter (Signed)
6:20 pm. Spoke with daughter [name] who is requesting intermittent FMLA to take her mother, Erika Murphy to doctor's appointments. There is a new issues with back pain with which I am not familiar with her recent specialty visits. Have asked them both to come for OV to sit down and discuss exactly what is needed with FMLA.

## 2019-06-12 ENCOUNTER — Ambulatory Visit: Payer: Medicare Other | Admitting: Internal Medicine

## 2019-06-26 ENCOUNTER — Ambulatory Visit: Payer: Medicare Other | Admitting: Internal Medicine

## 2019-06-26 DIAGNOSIS — M5136 Other intervertebral disc degeneration, lumbar region: Secondary | ICD-10-CM | POA: Diagnosis not present

## 2019-06-27 ENCOUNTER — Encounter: Payer: Self-pay | Admitting: Internal Medicine

## 2019-06-27 ENCOUNTER — Other Ambulatory Visit: Payer: Self-pay

## 2019-06-27 ENCOUNTER — Ambulatory Visit (INDEPENDENT_AMBULATORY_CARE_PROVIDER_SITE_OTHER): Payer: Medicare Other | Admitting: Internal Medicine

## 2019-06-27 VITALS — BP 130/70 | HR 80 | Ht 65.0 in | Wt 94.0 lb

## 2019-06-27 DIAGNOSIS — R634 Abnormal weight loss: Secondary | ICD-10-CM | POA: Diagnosis not present

## 2019-06-27 DIAGNOSIS — Z903 Acquired absence of stomach [part of]: Secondary | ICD-10-CM | POA: Diagnosis not present

## 2019-06-27 DIAGNOSIS — K909 Intestinal malabsorption, unspecified: Secondary | ICD-10-CM

## 2019-06-27 DIAGNOSIS — Z85028 Personal history of other malignant neoplasm of stomach: Secondary | ICD-10-CM | POA: Diagnosis not present

## 2019-06-27 NOTE — Patient Instructions (Signed)
FMLA form completed for daughter to be able to take off work and take patient to medical appointments.  Concerned about 4 pound weight loss.  Start Megace 40 mg/cc 1 teaspoon by mouth 4 times a day.  Follow-up here in July.  Prealbumin drawn.

## 2019-06-27 NOTE — Progress Notes (Signed)
   Subjective:    Patient ID: Erika Murphy, female    DOB: 13-May-1950, 69 y.o.   MRN: [id number]  HPI 69 year old Female with multiple sclerosis and history of gastric cancer seen today with her daughter [name].  She is asking that FMLA form be completed for her so she can take leave from work intermittently to go take mother to 30 appointments etc.  This form was completed today.  [name] works for [employer].  She is currently working from home but may be going back into the office this fall.  Patient has history of lumbar radiculopathy and recently had epidural steroid injection per Dr.Ramos.  Has been seeing Dr. Melina Schools and Dr. Lyla Glassing for back pain.  Diagnosed with degenerative disc disease.  Patient has history of right hip arthroplasty.  And September 2020 she saw neurologist for radiating pain to right leg and MRI of lumbar spine was normal.  Nerve conduction study was normal performed by neurologist.  Patient has history of gastric cancer.  Last upper endoscopy was 2019 and last colonoscopy was 2019.  Patient has multiple sclerosis and currently is not on any medication for it.  She has some issues ambulating.  She has  hypertension.  Her weight tends to run low.  She has lost 4 pounds since last office visit.  Prealbumin was drawn today.  Discussion regarding caloric intake.  She feels that she is eating fairly well but does not have a big appetite. Review of Systems see above     Objective:   Physical Exam Frail thin female seen in exam room in no acute distress. Blood pressure 130/70 pulse 80 pulse oximetry 98% weight 94 pounds.  BMI 15.64 skin warm and dry.  No cervical adenopathy.  No thyromegaly.  Chest clear.  Cardiac exam regular rate and rhythm.  No lower extremity edema.  Affect thought and judgment appear to be normal.      Assessment & Plan:  4 pound weight loss since last visit  Degenerative disc disease radiating into right leg and status post  recent epidural steroid by Dr. Nelva Bush  History of gastric cancer followed by Dr. Marin Olp  Multiple sclerosis followed by neurology and currently not on any medication except Lyrica  Essential hypertension-stable on current regimen  Plan: Prealbumin drawn improved to be within normal limits.  Patient is to work on her diet and caloric intake.  Has appointment in mid July for 61-month recheck and will reweigh her at that time.  FMLA form completed.

## 2019-06-28 LAB — PREALBUMIN: Prealbumin: 22 mg/dL (ref 17–34)

## 2019-07-15 ENCOUNTER — Other Ambulatory Visit: Payer: Medicare Other | Admitting: Internal Medicine

## 2019-07-15 ENCOUNTER — Other Ambulatory Visit: Payer: Self-pay

## 2019-07-15 DIAGNOSIS — E7800 Pure hypercholesterolemia, unspecified: Secondary | ICD-10-CM | POA: Diagnosis not present

## 2019-07-15 DIAGNOSIS — M8580 Other specified disorders of bone density and structure, unspecified site: Secondary | ICD-10-CM

## 2019-07-15 DIAGNOSIS — E785 Hyperlipidemia, unspecified: Secondary | ICD-10-CM | POA: Diagnosis not present

## 2019-07-16 LAB — LIPID PANEL
Cholesterol: 176 mg/dL (ref <200)
HDL: 51 mg/dL (ref >=50)
LDL Cholesterol (Calc): 106 mg/dL (calc) — ABNORMAL HIGH
Non-HDL Cholesterol (Calc): 125 mg/dL (calc) (ref <130)
Total CHOL/HDL Ratio: 3.5 (calc) (ref <5.0)
Triglycerides: 92 mg/dL (ref <150)

## 2019-07-17 ENCOUNTER — Encounter: Payer: Self-pay | Admitting: Internal Medicine

## 2019-07-17 ENCOUNTER — Other Ambulatory Visit: Payer: Self-pay

## 2019-07-17 ENCOUNTER — Ambulatory Visit (INDEPENDENT_AMBULATORY_CARE_PROVIDER_SITE_OTHER): Payer: Medicare Other | Admitting: Internal Medicine

## 2019-07-17 VITALS — BP 140/80 | HR 84 | Ht 65.0 in | Wt 95.0 lb

## 2019-07-17 DIAGNOSIS — I10 Essential (primary) hypertension: Secondary | ICD-10-CM | POA: Diagnosis not present

## 2019-07-17 DIAGNOSIS — Z903 Acquired absence of stomach [part of]: Secondary | ICD-10-CM | POA: Diagnosis not present

## 2019-07-17 DIAGNOSIS — R636 Underweight: Secondary | ICD-10-CM

## 2019-07-17 DIAGNOSIS — Z85028 Personal history of other malignant neoplasm of stomach: Secondary | ICD-10-CM

## 2019-07-17 DIAGNOSIS — G35 Multiple sclerosis: Secondary | ICD-10-CM

## 2019-07-17 DIAGNOSIS — M8580 Other specified disorders of bone density and structure, unspecified site: Secondary | ICD-10-CM | POA: Diagnosis not present

## 2019-07-17 DIAGNOSIS — E7800 Pure hypercholesterolemia, unspecified: Secondary | ICD-10-CM

## 2019-07-17 DIAGNOSIS — E538 Deficiency of other specified B group vitamins: Secondary | ICD-10-CM

## 2019-07-17 DIAGNOSIS — N319 Neuromuscular dysfunction of bladder, unspecified: Secondary | ICD-10-CM

## 2019-07-17 DIAGNOSIS — R413 Other amnesia: Secondary | ICD-10-CM

## 2019-07-17 MED ORDER — DULOXETINE HCL 30 MG PO CPEP: 30.0000 mg | ORAL_CAPSULE | Freq: Every day | ORAL | 5 refills | Status: DC

## 2019-07-17 NOTE — Progress Notes (Signed)
   Subjective:    Patient ID: Erika Murphy, female    DOB: 01/22/1950, 69 y.o.   MRN: [id number]  HPI 69 year old Female for 6 month recheck. Lipid panel is within normal limits. Prealbumin checked recently was normal at 22. Says she is trying to eat fairly well. I think it is hard for her to prepare food due to her multiple sclerosis and fatigue. Her appetite seems a bit better than it has in some time. She says she gets hungry.  Her weight is 95 pounds. In June her weight was 94 pounds. She has been having some issues with lumbar radiculopathy and is seen Dr. Nelva Bush.  She has history of gastric cancer followed by Dr. Marin Olp. She takes B12 injections at home. In March her B12 level was normal. Iron level was 56 and on the lower end of normal. In March she was not anemic. Her electrolytes were normal. Creatinine was 1.16. In January 2021 creatinine was 1.08.  She is on Lyrica for muscle spasms related to multiple sclerosis. This helps. She is also on Cymbalta for pain. She does take Valium at bedtime.  She has a history of hypertension and is on amlodipine 2.5 mg daily.  She has some respiratory congestion she relates to allergies and sometimes a bit of wheezing and I have given her an albuterol inhaler. We also started her on Crestor 5 mg weekly with supper in March as lipid panel in January showed total cholesterol 214 and LDL cholesterol of 136. This is improved a great deal with just once a week Crestor. Total cholesterol is now 176, triglycerides 92 and LDL cholesterol 106.  Review of Systems no new complaints     Objective:   Physical Exam  Blood pressure 140/80 pulse 84 pulse oximetry 98% weight 95 pounds height 5 feet 5 inches BMI 15.81 Neck supple without bruits. Chest clear to auscultation. Cardiac exam regular rate and rhythm. No lower extremity edema.     Assessment & Plan:  I would like to see her weight increased to nearly 100 pounds or better. She will try to eat healthy. We  are going to continue with same medications. She has close follow-up with Dr. Marin Olp. I will see her again in 6 months. Definitely Crestor once a week is helped her lipids quite a bit. Continue B12 injections monthly at home.

## 2019-07-18 DIAGNOSIS — M545 Low back pain: Secondary | ICD-10-CM | POA: Diagnosis not present

## 2019-07-18 DIAGNOSIS — M5416 Radiculopathy, lumbar region: Secondary | ICD-10-CM | POA: Diagnosis not present

## 2019-07-19 ENCOUNTER — Other Ambulatory Visit: Payer: Self-pay | Admitting: Internal Medicine

## 2019-07-21 ENCOUNTER — Other Ambulatory Visit: Payer: Self-pay | Admitting: Internal Medicine

## 2019-07-25 ENCOUNTER — Other Ambulatory Visit: Payer: Medicare Other

## 2019-07-29 ENCOUNTER — Inpatient Hospital Stay: Payer: Medicare Other

## 2019-07-29 ENCOUNTER — Inpatient Hospital Stay: Payer: Medicare Other | Admitting: Hematology & Oncology

## 2019-08-02 NOTE — Patient Instructions (Addendum)
Continue current medications including Crestor 5 mg weekly which has helped her cholesterol. Try to eat as well as possible. Try to gain a bit of weight. Return in 6 months. Continue B12 injections monthly. Prealbumin is normal.

## 2019-08-20 ENCOUNTER — Inpatient Hospital Stay: Payer: Medicare Other | Attending: Hematology & Oncology

## 2019-08-20 ENCOUNTER — Other Ambulatory Visit: Payer: Self-pay

## 2019-08-20 ENCOUNTER — Other Ambulatory Visit: Payer: Self-pay | Admitting: Internal Medicine

## 2019-08-20 ENCOUNTER — Telehealth: Payer: Self-pay | Admitting: Hematology & Oncology

## 2019-08-20 ENCOUNTER — Inpatient Hospital Stay (HOSPITAL_BASED_OUTPATIENT_CLINIC_OR_DEPARTMENT_OTHER): Payer: Medicare Other | Admitting: Hematology & Oncology

## 2019-08-20 ENCOUNTER — Telehealth: Payer: Self-pay | Admitting: Neurology

## 2019-08-20 ENCOUNTER — Encounter: Payer: Self-pay | Admitting: Hematology & Oncology

## 2019-08-20 VITALS — BP 122/66 | HR 69 | Temp 98.0°F | Resp 18 | Wt 96.0 lb

## 2019-08-20 DIAGNOSIS — G35 Multiple sclerosis: Secondary | ICD-10-CM | POA: Insufficient documentation

## 2019-08-20 DIAGNOSIS — D002 Carcinoma in situ of stomach: Secondary | ICD-10-CM

## 2019-08-20 DIAGNOSIS — K909 Intestinal malabsorption, unspecified: Secondary | ICD-10-CM | POA: Diagnosis not present

## 2019-08-20 DIAGNOSIS — E538 Deficiency of other specified B group vitamins: Secondary | ICD-10-CM | POA: Diagnosis not present

## 2019-08-20 DIAGNOSIS — D500 Iron deficiency anemia secondary to blood loss (chronic): Secondary | ICD-10-CM

## 2019-08-20 DIAGNOSIS — D508 Other iron deficiency anemias: Secondary | ICD-10-CM | POA: Diagnosis not present

## 2019-08-20 DIAGNOSIS — Z79899 Other long term (current) drug therapy: Secondary | ICD-10-CM | POA: Diagnosis not present

## 2019-08-20 DIAGNOSIS — D510 Vitamin B12 deficiency anemia due to intrinsic factor deficiency: Secondary | ICD-10-CM | POA: Diagnosis not present

## 2019-08-20 DIAGNOSIS — Z9049 Acquired absence of other specified parts of digestive tract: Secondary | ICD-10-CM | POA: Diagnosis not present

## 2019-08-20 DIAGNOSIS — Z800 Family history of malignant neoplasm of digestive organs: Secondary | ICD-10-CM | POA: Diagnosis present

## 2019-08-20 DIAGNOSIS — Z85028 Personal history of other malignant neoplasm of stomach: Secondary | ICD-10-CM | POA: Diagnosis not present

## 2019-08-20 LAB — CMP (CANCER CENTER ONLY)
ALT: 23 U/L (ref 0–44)
AST: 28 U/L (ref 15–41)
Albumin: 4.3 g/dL (ref 3.5–5.0)
Alkaline Phosphatase: 102 U/L (ref 38–126)
Anion gap: 5 (ref 5–15)
BUN: 15 mg/dL (ref 8–23)
CO2: 32 mmol/L (ref 22–32)
Calcium: 10.3 mg/dL (ref 8.9–10.3)
Chloride: 105 mmol/L (ref 98–111)
Creatinine: 0.95 mg/dL (ref 0.44–1.00)
GFR, Est AFR Am: >60 mL/min (ref >60)
GFR, Estimated: >60 mL/min (ref >60)
Glucose, Bld: 90 mg/dL (ref 70–99)
Potassium: 4.1 mmol/L (ref 3.5–5.1)
Sodium: 142 mmol/L (ref 135–145)
Total Bilirubin: 0.6 mg/dL (ref 0.3–1.2)
Total Protein: 7.8 g/dL (ref 6.5–8.1)

## 2019-08-20 LAB — CBC WITH DIFFERENTIAL (CANCER CENTER ONLY)
Abs Immature Granulocytes: 0 10*3/uL (ref 0.00–0.07)
Basophils Absolute: 0 10*3/uL (ref 0.0–0.1)
Basophils Relative: 1 %
Eosinophils Absolute: 0.1 10*3/uL (ref 0.0–0.5)
Eosinophils Relative: 4 %
HCT: 38.4 % (ref 36.0–46.0)
Hemoglobin: 12.3 g/dL (ref 12.0–15.0)
Immature Granulocytes: 0 %
Lymphocytes Relative: 49 %
Lymphs Abs: 1.1 10*3/uL (ref 0.7–4.0)
MCH: 30 pg (ref 26.0–34.0)
MCHC: 32 g/dL (ref 30.0–36.0)
MCV: 93.7 fL (ref 80.0–100.0)
Monocytes Absolute: 0.3 10*3/uL (ref 0.1–1.0)
Monocytes Relative: 11 %
Neutro Abs: 0.8 10*3/uL — ABNORMAL LOW (ref 1.7–7.7)
Neutrophils Relative %: 35 %
Platelet Count: 192 10*3/uL (ref 150–400)
RBC: 4.1 MIL/uL (ref 3.87–5.11)
RDW: 11.9 % (ref 11.5–15.5)
WBC Count: 2.3 10*3/uL — ABNORMAL LOW (ref 4.0–10.5)
nRBC: 0 % (ref 0.0–0.2)

## 2019-08-20 LAB — FERRITIN: Ferritin: 265 ng/mL (ref 11–307)

## 2019-08-20 LAB — IRON AND TIBC
Iron: 67 ug/dL (ref 41–142)
Saturation Ratios: 22 % (ref 21–57)
TIBC: 305 ug/dL (ref 236–444)
UIBC: 238 ug/dL (ref 120–384)

## 2019-08-20 LAB — RETICULOCYTES
Immature Retic Fract: 5.4 % (ref 2.3–15.9)
RBC.: 4.14 MIL/uL (ref 3.87–5.11)
Retic Count, Absolute: 39.7 10*3/uL (ref 19.0–186.0)
Retic Ct Pct: 1 % (ref 0.4–3.1)

## 2019-08-20 LAB — VITAMIN B12: Vitamin B-12: 362 pg/mL (ref 180–914)

## 2019-08-20 NOTE — Progress Notes (Signed)
Hematology and Oncology Follow Up Visit  Erika Murphy [id number] August 08, 1950 69 y.o. 08/20/2019   Principle Diagnosis:   Stage II (T3N0M0) adenocarcinoma of the stomach-remission Recurrent iron deficiency anemia  Pernicious anemia Multiple sclerosis Current Therapy:    IV iron as indicated  Vitamin B-12 1 mg IM every month     Interim History:  [name] is back for follow-up.  Overall, she is doing quite well.  There has been some deaths in her family from the coronavirus.  She has had her coronavirus vaccine already.  I think she gets her next 1 next week.  She has been healthy overall.  She has had no problems with the multiple sclerosis flaring up on her.  When we last saw her, her ferritin was 240 with iron saturation of 18%.  She has had no problems with nausea or vomiting.  She has had no change in bowel or bladder habits.  Her last vitamin B12 level was 656 when we saw her.  She overall, I think that her performance status is ECOG 2.    Medications:  Current Outpatient Medications:  .  albuterol (VENTOLIN HFA) 108 (90 Base) MCG/ACT inhaler, TAKE 2 PUFFS BY MOUTH EVERY 6 HOURS AS NEEDED FOR WHEEZE OR SHORTNESS OF BREATH, Disp: 8 g, Rfl: 11 .  albuterol (VENTOLIN HFA) 108 (90 Base) MCG/ACT inhaler, albuterol sulfate HFA 90 mcg/actuation aerosol inhaler, Disp: , Rfl:  .  amLODipine (NORVASC) 2.5 MG tablet, TAKE 1 TABLET BY MOUTH EVERY DAY, Disp: 90 tablet, Rfl: 3 .  cyanocobalamin (,VITAMIN B-12,) 1000 MCG/ML injection, INJECT 1 ML (1,000 MCG TOTAL) INTO THE MUSCLE ONCE A MONTH AS DIRECTED, Disp: 3 mL, Rfl: 3 .  diazepam (VALIUM) 2 MG tablet, Take one tablet by mouth daily at bedtime., Disp: 30 tablet, Rfl: 1 .  esomeprazole (NEXIUM) 40 MG capsule, TAKE 1 CAPSULE (40 MG TOTAL) BY MOUTH DAILY AT 12 NOON., Disp: 90 capsule, Rfl: 3 .  ibuprofen (ADVIL) 200 MG tablet, Take 200 mg by mouth daily as needed., Disp: , Rfl:  .  Multiple Vitamins-Minerals (MULTIVITAMIN GUMMIES  WOMENS PO), Take 1 each by mouth daily. , Disp: , Rfl:  .  pregabalin (LYRICA) 50 MG capsule, TAKE 1 CAPSULE BY MOUTH THREE TIMES A DAY, Disp: 90 capsule, Rfl: 5 .  rosuvastatin (CRESTOR) 5 MG tablet, One po weekly with supper for hypercholesterolemia, Disp: 12 tablet, Rfl: 3 .  donepezil (ARICEPT) 5 MG tablet, donepezil 5 mg tablet  TAKE ONE TABLET BY MOUTH DAILY FOR MEMORY ISSUES, Disp: , Rfl:  .  DULoxetine (CYMBALTA) 30 MG capsule, Take 1 capsule (30 mg total) by mouth daily., Disp: 30 capsule, Rfl: 5 .  megestrol (MEGACE) 40 MG/ML suspension, megestrol 400 mg/10 mL (40 mg/mL) oral suspension (Patient not taking: Reported on 08/20/2019), Disp: , Rfl:  .  predniSONE (STERAPRED UNI-PAK 21 TAB) 10 MG (21) TBPK tablet, TAKE IN TAPERING COURSE AS DIRECTED 6 5 4 3 2  1, Disp: , Rfl:   Allergies: No Known Allergies  Past Medical History, Surgical history, Social history, and Family History were reviewed and updated.  Review of Systems: Review of Systems  Constitutional: Positive for malaise/fatigue.  HENT: Positive for tinnitus.   Eyes: Positive for blurred vision.  Respiratory: Negative.   Cardiovascular: Negative.   Gastrointestinal: Positive for diarrhea.  Genitourinary: Negative.   Musculoskeletal: Positive for myalgias.  Skin: Negative.   Neurological: Positive for tingling and weakness.  Endo/Heme/Allergies: Negative.   Psychiatric/Behavioral: Negative.  Physical Exam:  weight is 96 lb (43.5 kg). Her oral temperature is 98 F (36.7 C). Her blood pressure is 122/66 and her pulse is 69. Her respiration is 18 and oxygen saturation is 100%.   Physical Exam Vitals reviewed.  HENT:     Head: Normocephalic and atraumatic.  Eyes:     Pupils: Pupils are equal, round, and reactive to light.  Cardiovascular:     Rate and Rhythm: Normal rate and regular rhythm.     Heart sounds: Normal heart sounds.  Pulmonary:     Effort: Pulmonary effort is normal.     Breath sounds: Normal  breath sounds.  Abdominal:     General: Bowel sounds are normal.     Palpations: Abdomen is soft.  Musculoskeletal:        General: No tenderness or deformity. Normal range of motion.     Cervical back: Normal range of motion.  Lymphadenopathy:     Cervical: No cervical adenopathy.  Skin:    General: Skin is warm and dry.     Findings: No erythema or rash.  Neurological:     Mental Status: She is alert and oriented to person, place, and time.  Psychiatric:        Behavior: Behavior normal.        Thought Content: Thought content normal.        Judgment: Judgment normal.      Lab Results  Component Value Date   WBC 2.3 (L) 08/20/2019   HGB 12.3 08/20/2019   HCT 38.4 08/20/2019   MCV 93.7 08/20/2019   PLT 192 08/20/2019     Chemistry      Component Value Date/Time   NA 140 03/27/2019 1144   NA 144 06/12/2016 1151   NA 142 06/11/2015 1156   K 3.8 03/27/2019 1144   K 4.7 06/12/2016 1151   K 4.3 06/11/2015 1156   CL 102 03/27/2019 1144   CL 107 06/12/2016 1151   CO2 29 03/27/2019 1144   CO2 32 06/12/2016 1151   CO2 29 06/11/2015 1156   BUN 18 03/27/2019 1144   BUN 11 06/12/2016 1151   BUN 13.5 06/11/2015 1156   CREATININE 1.16 (H) 03/27/2019 1144   CREATININE 1.08 (H) 01/14/2019 0906   CREATININE 1.2 (H) 06/11/2015 1156      Component Value Date/Time   CALCIUM 9.8 03/27/2019 1144   CALCIUM 9.6 06/12/2016 1151   CALCIUM 9.7 06/11/2015 1156   ALKPHOS 99 03/27/2019 1144   ALKPHOS 97 (H) 06/12/2016 1151   ALKPHOS 111 06/11/2015 1156   AST 29 03/27/2019 1144   AST 30 06/11/2015 1156   ALT 22 03/27/2019 1144   ALT 26 06/12/2016 1151   ALT 23 06/11/2015 1156   BILITOT 0.9 03/27/2019 1144   BILITOT 1.03 06/11/2015 1156      Impression and Plan: Erika Murphy is a 69 year old [place] female. She had a history of stage II stomach cancer. She underwent resection. She had adjuvant therapy. This is now about 20 years out from treatment. She is cured of the  stomach cancer.   She has occasional iron deficiency because of malabsorption.  We give her IV iron when necessary.  I would not think that she would need it today given her hemoglobin and her MCV.  She does get the vitamin B12.  I am pretty sure this is done by her family doctor.  We will go ahead and plan to get her back in another 6 months.  [name], MD 8/18/20219:39 AM

## 2019-08-20 NOTE — Telephone Encounter (Signed)
Called pt back to get further information. States she was dx with MS around 1997. She ended up seeing PCP after having EMG/NCS with Dr. Krista Blue last  ongoing pain in legs. PCP did xrays. She then saw Ortho/had hip replacement. Had ESI's completed. Pt reports she is having muscle cramps in hands/feet for the last year, ongoing leg pain.  She feels it is MS related and wants to be seen/discuss treatment options. She says it is not urgent. Scheduled next available appt for her on 11/13/19 at 1030am with Dr. Krista Blue. She would like to be called if any sooner appt becomes available.

## 2019-08-20 NOTE — Telephone Encounter (Signed)
Appointments scheduled calendar printed per 8/18 los

## 2019-08-20 NOTE — Telephone Encounter (Signed)
Pt called and LVM stating that she is needing to speak to RN regarding some problems she is having with her MS. Please advise.

## 2019-08-21 ENCOUNTER — Other Ambulatory Visit: Payer: Self-pay | Admitting: Internal Medicine

## 2019-08-26 ENCOUNTER — Ambulatory Visit: Payer: Medicare Other | Admitting: Neurology

## 2019-08-26 ENCOUNTER — Other Ambulatory Visit: Payer: Self-pay

## 2019-08-26 ENCOUNTER — Telehealth: Payer: Self-pay | Admitting: Neurology

## 2019-08-26 ENCOUNTER — Encounter: Payer: Self-pay | Admitting: Neurology

## 2019-08-26 VITALS — BP 119/60 | HR 66 | Ht 65.0 in | Wt 94.0 lb

## 2019-08-26 DIAGNOSIS — G35 Multiple sclerosis: Secondary | ICD-10-CM | POA: Diagnosis not present

## 2019-08-26 DIAGNOSIS — M5416 Radiculopathy, lumbar region: Secondary | ICD-10-CM

## 2019-08-26 MED ORDER — NORTRIPTYLINE HCL 10 MG PO CAPS
10.0000 mg | ORAL_CAPSULE | Freq: Every day | ORAL | 5 refills | Status: DC
Start: 2019-08-26 — End: 2019-11-17

## 2019-08-26 NOTE — Telephone Encounter (Signed)
[employer] medicare order sent to GI. No auth they will reach out to the patient to schedule.  

## 2019-08-26 NOTE — Progress Notes (Signed)
PATIENT: Erika Murphy DOB: 04-15-1950  REASON FOR VISIT: follow up HISTORY FROM: patient  HISTORY OF PRESENT ILLNESS: Today 08/26/19  HISTORY Erika Murphy is a 69 years old right-handed African American female, patient of Dr. Erling Cruz, followed up for multiple sclerosis.  Her primary care physician is Dr. Tommie Ard Baxley  She was diagnosed with multiple sclerosis in 1998, presented with spastic gait disorder, was treated with Betaseron since 1998, which was stopped in 2011 due to abnormal liver functional test, her abnormal liver function test did recover back to normal after stopping Betaseron.  Her multiple sclerosis symptoms have been fairly stable over years, there was no worsening of her symptoms without taking Betaseron and baclofen, she is only taking Lyrica 50 mg 3 times a day for neuropathic pain involving bilateral upper and lower extremities.  Since July 2014, she also had intermittent left shoulder radiating pain to her left lateral arm, left hand, sometimes woke her up from sleep, intermittent, there was no significant worsening of weakness.  She also had a history of left shoulder replacement in 2011,  She has a history of gastric adenocarcinoma, status post subtotal gastrectomy, partial omentectomy in March 20001, status post chemotherapy and radiation therapy.  Most recent MRI of the brain was in June 2013, small white matter hyperintensities are unchanged from 2010. No enhancing or restricted diffusion lesions are seen. These lesions are nonspecific and could be seen with multiple sclerosis or chronic microvascular ischemia most abnormality is seen in MRI of cervical spine, most recently in 2011,  MRI scan of the cervical spine in 2011 showed prominent spondylitic changes from C4-6 with mild left sided foramnial stenosis at C4-5, C5-6 and bilateral foraminal stenosis at C6-7. Illdefined spinal cord hyperintensities at C5-6 and T2-3 likely represent remote age  demyelinating plaque  She lives alone, ambulate with a cane, no bowel bladder incontinence.  She is dong well, still lives alone in a house with couple steps, using cane to ambulate, she never drive. She is taking Lyrica 50 mg 3 times a day for her upper and lower extremity pain, which has been very helpful, taking Valium 5 mg as needed,  Over the years, her multiple sclerosis symptoms has been very stable, there is no worsening of her gait problem.  She was admitted to the hospital on October 8th 2015, complaining of worsening gait difficulty, worsening right arm, and right leg weakness, deep achy pain, this was preceded by vomitting, loose stool for few days, was treated with IV Solu-Medrol for 3 days, repeat MRI of the brain, and cervical spine in Oct 2015, showed no acute lesions.  She continued to ambulate with a cane, dragging her right leg, reported 80% improvement, receiving home physical therapy, which has been helpful, Tylenol has been helpful too  UPDATE Oct 21 2014: She fell, and the broke her right hip require hip replacement August 2016, completed physical therapy. She has increased leg stiffiness, pain. Aleve prn helps.  UPDATE Oct 18th 2017: Today she comes in with a new complains of gradual onset memory loss, difficulty remembering conversations, forgetting peoples name,  There was no family history of dementia, she just came back from the trip visiting her twin sister at [place], she continues to complains of mild memory loss  I personally reviewed MRI of the brain in 2015, only mild supratentorium white matter gliosis, mild generalized atrophy,   I reviewed laboratory evaluation in October 2017, normal vitamin B84, folic acid, TSH, iron panels, CBC showed mildly  decreased WBC 3.2, hemoglobin of 12 point 5, she has a history of vitamin D deficiency, level was 22 in 2015, was 10 in 2016,  She had a previous history of vitamin D deficiency, but is not taking any  vitamin D supplements.  UPDATE April 19 2016: She has numbness in her right fingers, pain at her right arm, radiating pain from right neck to right shoulder, right arm, she also has right low back pain, radiating pain to right buttock,she use cane at her right hand, sometimes right finger numbness woke her up from sleep.  We have personally reviewed MRI of the brain in January 2018, few T2/FLAIR hyperintensity lesion, stable from 2015, MRI cervical spine in 2015, multilevel mild degenerative changes, chronic C5-6 lesion, stable, she also complains of right foot weakness, intermittent low back pain.  UPDATE July 16 2018: She is not on any long-term immunomodulation therapy for MS, there is no worsening of her MS symptoms, but since June 2020, without clear triggers, she began to notice radiating pain to her right hip region, radiating to right leg, increased gait abnormality, she denies significant left lower extremity symptoms, denies bowel and bladder incontinence  Update August 26, 2019 SS:  Last seen in July 2020, at that time, complaining of right leg pain.  Nerve conduction in September 2020 was normal.  Called recently reported, saw Ortho over last year for her ongoing leg pain,  had ESI.  Continues to complain of pain to the right leg mostly, but sometimes the left. The pain comes and goes, started in the right leg, from knee down, then up whole leg, in between thighs, then went to left leg, feels like needle like is constant. Will come for a few weeks then go away, for at least 1 year. No known triggers, tried to adjust cleaning routine, activity.   MRI of lumbar spine September 2020 showed no significant abnormalities  Is not currently taking Cymbalta 30 mg daily, upsets her stomach Is taking Lyrica 50 mg 3 times a day.   No numbness or weakness of arms of legs. No changes bowels of bladder. History of stomach cancer, doing well. No changes to vision. Had a vertigo spell a few days  agon in the bed when turning over.  Has been off MS medication since around 2011. Has chronic weakness to the right leg, gait abnormality.  REVIEW OF SYSTEMS: Out of a complete 14 system review of symptoms, the patient complains only of the following symptoms, and all other reviewed systems are negative.  Walking difficulty, leg pain  ALLERGIES: No Known Allergies  HOME MEDICATIONS: Outpatient Medications Prior to Visit  Medication Sig Dispense Refill  . albuterol (VENTOLIN HFA) 108 (90 Base) MCG/ACT inhaler TAKE 2 PUFFS BY MOUTH EVERY 6 HOURS AS NEEDED FOR WHEEZE OR SHORTNESS OF BREATH 8 g 11  . albuterol (VENTOLIN HFA) 108 (90 Base) MCG/ACT inhaler albuterol sulfate HFA 90 mcg/actuation aerosol inhaler    . amLODipine (NORVASC) 2.5 MG tablet TAKE 1 TABLET BY MOUTH EVERY DAY 90 tablet 3  . cyanocobalamin (,VITAMIN B-12,) 1000 MCG/ML injection INJECT 1 ML (1,000 MCG TOTAL) INTO THE MUSCLE ONCE A MONTH AS DIRECTED 3 mL 3  . donepezil (ARICEPT) 5 MG tablet donepezil 5 mg tablet  TAKE ONE TABLET BY MOUTH DAILY FOR MEMORY ISSUES    . esomeprazole (NEXIUM) 40 MG capsule TAKE 1 CAPSULE (40 MG TOTAL) BY MOUTH DAILY AT 12 NOON. 90 capsule 3  . megestrol (MEGACE) 40 MG/ML suspension megestrol  400 mg/10 mL (40 mg/mL) oral suspension    . Multiple Vitamins-Minerals (MULTIVITAMIN GUMMIES WOMENS PO) Take 1 each by mouth daily.     . pregabalin (LYRICA) 50 MG capsule TAKE 1 CAPSULE BY MOUTH THREE TIMES A DAY 90 capsule 5  . rosuvastatin (CRESTOR) 5 MG tablet One po weekly with supper for hypercholesterolemia 12 tablet 3  . DULoxetine (CYMBALTA) 30 MG capsule Take 1 capsule (30 mg total) by mouth daily. 30 capsule 5  . diazepam (VALIUM) 2 MG tablet Take one tablet by mouth daily at bedtime. 30 tablet 1  . ibuprofen (ADVIL) 200 MG tablet Take 200 mg by mouth daily as needed.    . predniSONE (STERAPRED UNI-PAK 21 TAB) 10 MG (21) TBPK tablet TAKE IN TAPERING COURSE AS DIRECTED 6 5 4 3 2 1      No  facility-administered medications prior to visit.    PAST MEDICAL HISTORY: Past Medical History:  Diagnosis Date  . Anxiety   . Arthritis    "right shoulder" (10/09/2013)  . Asthma    pt denies  . B12 deficiency anemia    pt denies  . Fibromyalgia   . GERD (gastroesophageal reflux disease)   . History of blood transfusion 2001   "when I had my stomach cancer"  . Hypertension   . IBS (irritable bowel syndrome)   . Iron deficiency anemia 12/11/2014  . Malabsorption of iron 12/11/2014  . MS (multiple sclerosis) ([place]) dx'd ~ 1998  . Neurogenic bladder   . Osteopenia   . Osteoporosis   . PONV (postoperative nausea and vomiting)   . Stomach cancer ([place]) 2001  . Thyroid nodule    right    PAST SURGICAL HISTORY: Past Surgical History:  Procedure Laterality Date  . BIOPSY THYROID Right 1990's  . [place]; 1984  . COLONOSCOPY WITH PROPOFOL N/A 05/04/2017   Procedure: COLONOSCOPY WITH PROPOFOL;  Surgeon: Carol Ada, MD;  Location: [facility];  Service: Endoscopy;  Laterality: N/A;  . ESOPHAGOGASTRODUODENOSCOPY (EGD) WITH PROPOFOL N/A 05/04/2017   Procedure: ESOPHAGOGASTRODUODENOSCOPY (EGD) WITH PROPOFOL;  Surgeon: Carol Ada, MD;  Location: [facility];  Service: Endoscopy;  Laterality: N/A;  . JOINT REPLACEMENT    . OMENTECTOMY  2001   partial  . PARTIAL GASTRECTOMY  2001  . TONSILLECTOMY  ~ 1959  . TOTAL HIP ARTHROPLASTY Right 08/18/2014   Procedure: TOTAL HIP ARTHROPLASTY ANTERIOR APPROACH;  Surgeon: Rod Can, MD;  Location: [facility];  Service: Orthopedics;  Laterality: Right;  . TOTAL SHOULDER ARTHROPLASTY Left 2011    FAMILY HISTORY: Family History  Problem Relation Age of Onset  . Heart disease Mother   . Diabetes Father   . Stomach cancer Brother   . Multiple sclerosis Other   . Breast cancer Sister        2 sisters breast cancer   . Breast cancer Maternal Aunt     SOCIAL HISTORY: Social History   Socioeconomic History  . Marital  status: Divorced    Spouse name: Not on file  . Number of children: 3   . Years of education: Not on file  . Highest education level: Not on file  Occupational History  . Occupation: DISABLED    Employer: [employer]  Tobacco Use  . Smoking status: Never Smoker  . Smokeless tobacco: Never Used  . Tobacco comment: NEVER USED TOBACCO  Vaping Use  . Vaping Use: Never used  Substance and Sexual Activity  . Alcohol use: No    Alcohol/week: 0.0  standard drinks  . Drug use: No  . Sexual activity: Not Currently  Other Topics Concern  . Not on file  Social History Narrative   Patient is retired.    Patient is married with 3 children.   Patient drinks caffeine daily.    Social Determinants of Health   Financial Resource Strain:   . Difficulty of Paying Living Expenses: Not on file  Food Insecurity:   . Worried About Charity fundraiser in the Last Year: Not on file  . Ran Out of Food in the Last Year: Not on file  Transportation Needs:   . Lack of Transportation (Medical): Not on file  . Lack of Transportation (Non-Medical): Not on file  Physical Activity:   . Days of Exercise per Week: Not on file  . Minutes of Exercise per Session: Not on file  Stress:   . Feeling of Stress : Not on file  Social Connections:   . Frequency of Communication with Friends and Family: Not on file  . Frequency of Social Gatherings with Friends and Family: Not on file  . Attends Religious Services: Not on file  . Active Member of Clubs or Organizations: Not on file  . Attends Archivist Meetings: Not on file  . Marital Status: Not on file  Intimate Partner Violence:   . Fear of Current or Ex-Partner: Not on file  . Emotionally Abused: Not on file  . Physically Abused: Not on file  . Sexually Abused: Not on file  PHYSICAL EXAM  Vitals:   08/26/19 1501  BP: 119/60  Pulse: 66  Weight: 94 lb (42.6 kg)  Height: 5\' 5"  (1.651 m)   Body mass index is 15.64 kg/m.  Generalized: Well  developed, in no acute distress   Neurological examination  Mentation: Alert oriented to time, place, history taking. Follows all commands speech and language fluent Cranial nerve II-XII: Pupils were equal round reactive to light. Extraocular movements were full, visual field were full on confrontational test. Facial sensation and strength were normal.  Head turning and shoulder shrug  were normal and symmetric. Motor: 3.5 weakness right hip flexion, 4/5 right knee extension Sensory: Sensory testing is intact to soft touch on all 4 extremities. No evidence of extinction is noted.  Coordination: Cerebellar testing reveals good finger-nose-finger and heel-to-shin bilaterally.  Gait and station: Gait is somewhat wide-based, tends to drag right leg, can walk independently in the room Reflexes: Deep tendon reflexes are symmetric and normal bilaterally.   DIAGNOSTIC DATA (LABS, IMAGING, TESTING) - I reviewed patient records, labs, notes, testing and imaging myself where available.  Lab Results  Component Value Date   WBC 2.3 (L) 08/20/2019   HGB 12.3 08/20/2019   HCT 38.4 08/20/2019   MCV 93.7 08/20/2019   PLT 192 08/20/2019      Component Value Date/Time   NA 142 08/20/2019 0916   NA 144 06/12/2016 1151   NA 142 06/11/2015 1156   K 4.1 08/20/2019 0916   K 4.7 06/12/2016 1151   K 4.3 06/11/2015 1156   CL 105 08/20/2019 0916   CL 107 06/12/2016 1151   CO2 32 08/20/2019 0916   CO2 32 06/12/2016 1151   CO2 29 06/11/2015 1156   GLUCOSE 90 08/20/2019 0916   GLUCOSE 88 06/12/2016 1151   BUN 15 08/20/2019 0916   BUN 11 06/12/2016 1151   BUN 13.5 06/11/2015 1156   CREATININE 0.95 08/20/2019 0916   CREATININE 1.08 (H) 01/14/2019 3383  CREATININE 1.2 (H) 06/11/2015 1156   CALCIUM 10.3 08/20/2019 0916   CALCIUM 9.6 06/12/2016 1151   CALCIUM 9.7 06/11/2015 1156   PROT 7.8 08/20/2019 0916   PROT 7.5 06/12/2016 1151   PROT 7.5 06/11/2015 1156   ALBUMIN 4.3 08/20/2019 0916   ALBUMIN 3.8  06/12/2016 1151   ALBUMIN 3.8 06/11/2015 1156   AST 28 08/20/2019 0916   AST 30 06/11/2015 1156   ALT 23 08/20/2019 0916   ALT 26 06/12/2016 1151   ALT 23 06/11/2015 1156   ALKPHOS 102 08/20/2019 0916   ALKPHOS 97 (H) 06/12/2016 1151   ALKPHOS 111 06/11/2015 1156   BILITOT 0.6 08/20/2019 0916   BILITOT 1.03 06/11/2015 1156   GFRNONAA >60 08/20/2019 0916   GFRNONAA 52 (L) 01/14/2019 0906   GFRAA >60 08/20/2019 0916   GFRAA 60 01/14/2019 0906   Lab Results  Component Value Date   CHOL 176 07/15/2019   HDL 51 07/15/2019   LDLCALC 106 (H) 07/15/2019   TRIG 92 07/15/2019   CHOLHDL 3.5 07/15/2019   No results found for: HGBA1C Lab Results  Component Value Date   VITAMINB12 362 08/20/2019   Lab Results  Component Value Date   TSH 1.33 01/14/2019      ASSESSMENT AND PLAN 69 y.o. year old female  has a past medical history of Anxiety, Arthritis, Asthma, B12 deficiency anemia, Fibromyalgia, GERD (gastroesophageal reflux disease), History of blood transfusion (2001), Hypertension, IBS (irritable bowel syndrome), Iron deficiency anemia (12/11/2014), Malabsorption of iron (12/11/2014), MS (multiple sclerosis) ([place]) (dx'd ~ 1998), Neurogenic bladder, Osteopenia, Osteoporosis, PONV (postoperative nausea and vomiting), Stomach cancer ([place]) (2001), and Thyroid nodule. here with:  1.  Secondary progressive multiple sclerosis  -Is not on any long-term immunomodulating therapy  -Abnormalities mainly in cervical spine, treated with Betaseron [phone] developed abnormal liver function, recovered after stopping Betaseron, has been stable for years  -Due to report of increasing pain in right leg, paresthesia, also into left leg, more walking difficulty, will check MRI of the brain and cervical spine with and without contrast to ensure MS stability, she feels MS may be acting up  -Creatinine 0.95 08/20/2019  2.  Right low back pain, radiating pain to right lower extremity  -MRI of lumbar  spine did not show any significant abnormalities in September 2020  -EMG nerve conduction study was normal in September 2020  -Stopped Cymbalta due to stomach upset, will try nortriptyline 10 mg at bedtime, will remain on Lyrica  -Follow-up in 3 months or sooner if needed   I spent 30 minutes of face-to-face and non-face-to-face time with patient.  This included previsit chart review, lab review, study review, order entry, electronic health record documentation, patient education.  Butler Denmark, AGNP-C, DNP 08/26/2019, 4:37 PM [facility] [address] [place], [place] [address] [phone]

## 2019-08-26 NOTE — Patient Instructions (Addendum)
Order MRI of the brain and cervical spine today  Try nortriptyline 10 mg at bedtime for symptoms Continue Lyrica from PCP  See you back in 3 months

## 2019-09-10 ENCOUNTER — Other Ambulatory Visit: Payer: Medicare Other

## 2019-09-14 ENCOUNTER — Telehealth: Payer: Self-pay | Admitting: Internal Medicine

## 2019-09-14 ENCOUNTER — Other Ambulatory Visit: Payer: Self-pay | Admitting: Internal Medicine

## 2019-09-14 MED ORDER — PREGABALIN 50 MG PO CAPS
ORAL_CAPSULE | ORAL | 5 refills | Status: DC
Start: 2019-09-14 — End: 2019-09-18

## 2019-09-14 NOTE — Telephone Encounter (Signed)
Have e-scribed Lyrica for 6 month 3 times a day

## 2019-09-18 ENCOUNTER — Telehealth: Payer: Self-pay | Admitting: Internal Medicine

## 2019-09-18 MED ORDER — PREGABALIN 50 MG PO CAPS: ORAL_CAPSULE | ORAL | 5 refills | Status: DC

## 2019-09-18 NOTE — Telephone Encounter (Signed)
Erika Murphy [phone]  [name] called to say that [facility] claims they did not receive refill for below medication. Was sent 09/14/2019   pregabalin (LYRICA) 50 MG capsule   [id number] [address], [place] [address] Phone:  [phone]  Fax:  [phone]

## 2019-09-28 ENCOUNTER — Ambulatory Visit
Admission: RE | Admit: 2019-09-28 | Discharge: 2019-09-28 | Disposition: A | Discharge status: Home / Self Care | Payer: Medicare Other | Source: Ambulatory Visit | Attending: Neurology | Admitting: Neurology

## 2019-09-28 DIAGNOSIS — G35 Multiple sclerosis: Secondary | ICD-10-CM | POA: Diagnosis not present

## 2019-09-28 MED ORDER — GADOBENATE DIMEGLUMINE 529 MG/ML IV SOLN
10.0000 mL | Freq: Once | INTRAVENOUS | Status: AC | PRN
  Administered 2019-09-28: 10 mL via INTRAVENOUS

## 2019-10-02 ENCOUNTER — Telehealth: Payer: Self-pay | Admitting: Neurology

## 2019-10-02 NOTE — Telephone Encounter (Signed)
Pt returned phone call.  

## 2019-10-02 NOTE — Telephone Encounter (Signed)
Spoke with patient about results. She verbalized understanding and had no further questions. Nothing further needed at time of call.

## 2019-10-02 NOTE — Telephone Encounter (Signed)
Suzzanne Cloud, NP  10/01/2019 5:08 PM EDT     MRI of the brain was overall stable, no change from 2018. MRI of cervical spine was also stable, reported subtle intrinsic spinal cord T2 hyperintensity at C5-6, reviewed MRI report in 2015 and was also mentioned, seems overall stable.   Left message for patient to call back.

## 2019-10-15 ENCOUNTER — Other Ambulatory Visit: Payer: Self-pay | Admitting: Internal Medicine

## 2019-10-15 DIAGNOSIS — Z1231 Encounter for screening mammogram for malignant neoplasm of breast: Secondary | ICD-10-CM

## 2019-10-18 ENCOUNTER — Other Ambulatory Visit: Payer: Self-pay

## 2019-10-18 ENCOUNTER — Ambulatory Visit: Payer: Medicare Other | Attending: Internal Medicine

## 2019-10-18 DIAGNOSIS — Z23 Encounter for immunization: Secondary | ICD-10-CM

## 2019-10-18 NOTE — Progress Notes (Signed)
   Covid-19 Vaccination Clinic  Name:  NELL GALES    MRN: [id number] DOB: 1950/06/10  10/18/2019  Ms. Urquiza was observed post Covid-19 immunization for 15 minutes without incident. She was provided with Vaccine Information Sheet and instruction to access the V-Safe system.   Ms. Lacross was instructed to call 911 with any severe reactions post vaccine: Marland Kitchen Difficulty breathing  . Swelling of face and throat  . A fast heartbeat  . A bad rash all over body  . Dizziness and weakness

## 2019-10-21 ENCOUNTER — Ambulatory Visit
Admission: RE | Admit: 2019-10-21 | Discharge: 2019-10-21 | Disposition: A | Discharge status: Home / Self Care | Payer: Medicare Other | Source: Ambulatory Visit | Attending: Internal Medicine | Admitting: Internal Medicine

## 2019-10-21 ENCOUNTER — Other Ambulatory Visit: Payer: Self-pay

## 2019-10-21 DIAGNOSIS — M810 Age-related osteoporosis without current pathological fracture: Secondary | ICD-10-CM | POA: Diagnosis not present

## 2019-10-21 DIAGNOSIS — Z780 Asymptomatic menopausal state: Secondary | ICD-10-CM | POA: Diagnosis not present

## 2019-10-21 DIAGNOSIS — M8580 Other specified disorders of bone density and structure, unspecified site: Secondary | ICD-10-CM

## 2019-10-23 ENCOUNTER — Other Ambulatory Visit: Payer: Self-pay | Admitting: Internal Medicine

## 2019-11-13 ENCOUNTER — Ambulatory Visit: Payer: Self-pay | Admitting: Neurology

## 2019-11-15 ENCOUNTER — Other Ambulatory Visit: Payer: Self-pay | Admitting: Neurology

## 2019-11-24 NOTE — Progress Notes (Signed)
I have reviewed and agreed above plan. 

## 2019-11-26 ENCOUNTER — Ambulatory Visit (INDEPENDENT_AMBULATORY_CARE_PROVIDER_SITE_OTHER): Payer: Medicare Other | Admitting: Internal Medicine

## 2019-11-26 ENCOUNTER — Ambulatory Visit: Payer: Medicare Other | Admitting: Neurology

## 2019-11-26 ENCOUNTER — Encounter: Payer: Self-pay | Admitting: Internal Medicine

## 2019-11-26 ENCOUNTER — Other Ambulatory Visit: Payer: Self-pay

## 2019-11-26 VITALS — BP 102/60 | HR 72 | Temp 97.9°F | Ht 65.0 in | Wt 93.0 lb

## 2019-11-26 DIAGNOSIS — M810 Age-related osteoporosis without current pathological fracture: Secondary | ICD-10-CM

## 2019-11-26 MED ORDER — DENOSUMAB 60 MG/ML ~~LOC~~ SOSY
60.0000 mg | PREFILLED_SYRINGE | Freq: Once | SUBCUTANEOUS | Status: AC
  Administered 2019-11-26: 60 mg via SUBCUTANEOUS

## 2019-11-26 NOTE — Progress Notes (Signed)
First Prolia injection given today by CMA. Next dose due in 6 months. Recent bone density T score was -2.6

## 2019-11-26 NOTE — Patient Instructions (Addendum)
Next Prolia injection due in 6 months. First dose given today.

## 2019-12-01 ENCOUNTER — Ambulatory Visit
Admission: RE | Admit: 2019-12-01 | Discharge: 2019-12-01 | Disposition: A | Discharge status: Home / Self Care | Payer: Medicare Other | Source: Ambulatory Visit | Attending: Internal Medicine | Admitting: Internal Medicine

## 2019-12-01 ENCOUNTER — Other Ambulatory Visit: Payer: Self-pay

## 2019-12-01 DIAGNOSIS — Z1231 Encounter for screening mammogram for malignant neoplasm of breast: Secondary | ICD-10-CM | POA: Diagnosis not present

## 2019-12-22 ENCOUNTER — Encounter: Payer: Self-pay | Admitting: Internal Medicine

## 2019-12-22 ENCOUNTER — Ambulatory Visit (INDEPENDENT_AMBULATORY_CARE_PROVIDER_SITE_OTHER): Payer: Medicare Other | Admitting: Internal Medicine

## 2019-12-22 ENCOUNTER — Telehealth: Payer: Self-pay | Admitting: Internal Medicine

## 2019-12-22 ENCOUNTER — Other Ambulatory Visit: Payer: Self-pay

## 2019-12-22 VITALS — BP 110/70 | HR 68 | Temp 97.7°F | Ht 65.0 in | Wt 93.0 lb

## 2019-12-22 DIAGNOSIS — J329 Chronic sinusitis, unspecified: Secondary | ICD-10-CM | POA: Diagnosis not present

## 2019-12-22 MED ORDER — AZITHROMYCIN 250 MG PO TABS: ORAL_TABLET | ORAL | 0 refills | Status: DC

## 2019-12-22 MED ORDER — MECLIZINE HCL 25 MG PO TABS: 25.0000 mg | ORAL_TABLET | Freq: Three times a day (TID) | ORAL | 0 refills | Status: AC | PRN

## 2019-12-22 MED ORDER — METHYLPREDNISOLONE ACETATE 80 MG/ML IJ SUSP
80.0000 mg | Freq: Once | INTRAMUSCULAR | Status: AC
  Administered 2019-12-22: 80 mg via INTRAMUSCULAR

## 2019-12-22 NOTE — Telephone Encounter (Signed)
OK 

## 2019-12-22 NOTE — Progress Notes (Signed)
   Subjective:    Patient ID: Erika Murphy, female    DOB: [birth year], 69 y.o.   MRN: [id number]  HPI 69 year old Female with history of MS seen by Dr. Krista Blue seen today with dizziness and feeling off balance.  Has not fallen.  Has some generalized weakness related to MS.  Is alert and oriented.  Her speech is clear.  No complaint of headache.    Review of Systems no nausea or vomiting fever or chills.  Has some nasal congestion.  Says ears feel clogged.     Objective:   Physical Exam Blood pressure 110/70 pulse 68 temperature 97.7 pulse oximetry 97% weight 93 pounds BMI 15.48  Thin Black Female seen in no acute distress.  Skin warm and dry.  Chest is clear.  Cardiac exam: Regular rate and rhythm.  She has rightward nystagmus today.  PERRLA.  Extraocular movements are full.  No facial weakness.  She moves all 4 extremities.  Neck is supple.  She is ambulatory.  TMs are clear.  Ear canals are clear.  TMs are not red.  Pharynx is clear.      Assessment & Plan:  Rightward nystagmus consistent with vertigo  History of multiple sclerosis-currently not on treatment  Nasal congestion  Plan: She will be treated with meclizine 25 mg up to 3 times daily as needed for dizziness.  She will call if symptoms not improving.  I am not sure if vertigo related to MS or to benign positional vertigo.  We will continue to monitor her closely and she will let me know if not improving.  I have not ordered an MRI of the brain at this time.  She was given 80 mg IM Depo-Medrol for nasal and ear congestion.

## 2019-12-22 NOTE — Patient Instructions (Signed)
Take meclizine up to 3 times daily as needed for dizziness.  Rest and drink plenty of fluids and call if not improving in 24 to 48 hours or sooner if worse.  Depo-Medrol 80 mg IM given for nasal congestion.

## 2019-12-22 NOTE — Telephone Encounter (Signed)
Scheduled

## 2019-12-22 NOTE — Telephone Encounter (Signed)
Erika Murphy [phone]  Hadlyn called to say when she woke up today she has been feeling off balance, she felt like bed was moving, and like she is going to fall, she is walking with walker. Would like to come in to be seen.

## 2020-01-03 ENCOUNTER — Other Ambulatory Visit: Payer: Self-pay | Admitting: Internal Medicine

## 2020-01-12 ENCOUNTER — Other Ambulatory Visit: Payer: Medicare Other

## 2020-01-12 DIAGNOSIS — Z20822 Contact with and (suspected) exposure to covid-19: Secondary | ICD-10-CM | POA: Diagnosis not present

## 2020-01-14 LAB — NOVEL CORONAVIRUS, NAA: SARS-CoV-2, NAA: NOT DETECTED

## 2020-01-14 LAB — SARS-COV-2, NAA 2 DAY TAT

## 2020-01-16 ENCOUNTER — Other Ambulatory Visit: Payer: Medicare Other | Admitting: Internal Medicine

## 2020-01-16 ENCOUNTER — Other Ambulatory Visit: Payer: Self-pay

## 2020-01-16 DIAGNOSIS — Z85028 Personal history of other malignant neoplasm of stomach: Secondary | ICD-10-CM

## 2020-01-16 DIAGNOSIS — M810 Age-related osteoporosis without current pathological fracture: Secondary | ICD-10-CM

## 2020-01-16 DIAGNOSIS — R413 Other amnesia: Secondary | ICD-10-CM

## 2020-01-16 DIAGNOSIS — E7800 Pure hypercholesterolemia, unspecified: Secondary | ICD-10-CM

## 2020-01-16 DIAGNOSIS — M8580 Other specified disorders of bone density and structure, unspecified site: Secondary | ICD-10-CM

## 2020-01-16 DIAGNOSIS — E538 Deficiency of other specified B group vitamins: Secondary | ICD-10-CM | POA: Diagnosis not present

## 2020-01-16 DIAGNOSIS — Z0000 Encounter for general adult medical examination without abnormal findings: Secondary | ICD-10-CM

## 2020-01-16 DIAGNOSIS — N319 Neuromuscular dysfunction of bladder, unspecified: Secondary | ICD-10-CM

## 2020-01-16 DIAGNOSIS — R636 Underweight: Secondary | ICD-10-CM

## 2020-01-16 DIAGNOSIS — K909 Intestinal malabsorption, unspecified: Secondary | ICD-10-CM

## 2020-01-16 DIAGNOSIS — I10 Essential (primary) hypertension: Secondary | ICD-10-CM | POA: Diagnosis not present

## 2020-01-17 LAB — CBC WITH DIFFERENTIAL/PLATELET
Absolute Monocytes: 226 cells/uL (ref 200–950)
Basophils Absolute: 19 cells/uL (ref 0–200)
Basophils Relative: 1 %
Eosinophils Absolute: 78 cells/uL (ref 15–500)
Eosinophils Relative: 4.1 %
HCT: 39.2 % (ref 35.0–45.0)
Hemoglobin: 12.9 g/dL (ref 11.7–15.5)
Lymphs Abs: 817 cells/uL — ABNORMAL LOW (ref 850–3900)
MCH: 30.1 pg (ref 27.0–33.0)
MCHC: 32.9 g/dL (ref 32.0–36.0)
MCV: 91.6 fL (ref 80.0–100.0)
MPV: 11 fL (ref 7.5–12.5)
Monocytes Relative: 11.9 %
Neutro Abs: 760 cells/uL — ABNORMAL LOW (ref 1500–7800)
Neutrophils Relative %: 40 %
Platelets: 199 10*3/uL (ref 140–400)
RBC: 4.28 10*6/uL (ref 3.80–5.10)
RDW: 11.3 % (ref 11.0–15.0)
Total Lymphocyte: 43 %
WBC: 1.9 10*3/uL — ABNORMAL LOW (ref 3.8–10.8)

## 2020-01-17 LAB — COMPLETE METABOLIC PANEL WITH GFR
AG Ratio: 1.3 (calc) (ref 1.0–2.5)
ALT: 13 U/L (ref 6–29)
AST: 19 U/L (ref 10–35)
Albumin: 3.9 g/dL (ref 3.6–5.1)
Alkaline phosphatase (APISO): 67 U/L (ref 37–153)
BUN: 14 mg/dL (ref 7–25)
CO2: 26 mmol/L (ref 20–32)
Calcium: 8.5 mg/dL — ABNORMAL LOW (ref 8.6–10.4)
Chloride: 110 mmol/L (ref 98–110)
Creat: 0.81 mg/dL (ref 0.60–0.93)
GFR, Est African American: 85 mL/min/{1.73_m2} (ref >=60)
GFR, Est Non African American: 73 mL/min/{1.73_m2} (ref >=60)
Globulin: 3 g/dL (calc) (ref 1.9–3.7)
Glucose, Bld: 89 mg/dL (ref 65–99)
Potassium: 4.9 mmol/L (ref 3.5–5.3)
Sodium: 142 mmol/L (ref 135–146)
Total Bilirubin: 0.7 mg/dL (ref 0.2–1.2)
Total Protein: 6.9 g/dL (ref 6.1–8.1)

## 2020-01-17 LAB — LIPID PANEL
Cholesterol: 201 mg/dL — ABNORMAL HIGH (ref <200)
HDL: 47 mg/dL — ABNORMAL LOW (ref >=50)
LDL Cholesterol (Calc): 133 mg/dL (calc) — ABNORMAL HIGH
Non-HDL Cholesterol (Calc): 154 mg/dL (calc) — ABNORMAL HIGH (ref <130)
Total CHOL/HDL Ratio: 4.3 (calc) (ref <5.0)
Triglycerides: 107 mg/dL (ref <150)

## 2020-01-17 LAB — TSH: TSH: 0.82 mIU/L (ref 0.40–4.50)

## 2020-01-19 ENCOUNTER — Encounter: Payer: Medicare Other | Admitting: Internal Medicine

## 2020-01-22 ENCOUNTER — Other Ambulatory Visit: Payer: Self-pay

## 2020-01-22 ENCOUNTER — Ambulatory Visit (INDEPENDENT_AMBULATORY_CARE_PROVIDER_SITE_OTHER): Payer: Medicare Other | Admitting: Internal Medicine

## 2020-01-22 ENCOUNTER — Ambulatory Visit
Admission: RE | Admit: 2020-01-22 | Discharge: 2020-01-22 | Disposition: A | Discharge status: Home / Self Care | Payer: Medicare Other | Source: Ambulatory Visit | Attending: Internal Medicine | Admitting: Internal Medicine

## 2020-01-22 ENCOUNTER — Encounter: Payer: Self-pay | Admitting: Internal Medicine

## 2020-01-22 VITALS — BP 120/80 | HR 90 | Temp 97.7°F | Ht 65.0 in | Wt 90.0 lb

## 2020-01-22 DIAGNOSIS — R636 Underweight: Secondary | ICD-10-CM | POA: Diagnosis not present

## 2020-01-22 DIAGNOSIS — R0602 Shortness of breath: Secondary | ICD-10-CM

## 2020-01-22 DIAGNOSIS — I10 Essential (primary) hypertension: Secondary | ICD-10-CM

## 2020-01-22 DIAGNOSIS — R059 Cough, unspecified: Secondary | ICD-10-CM

## 2020-01-22 DIAGNOSIS — Z0000 Encounter for general adult medical examination without abnormal findings: Secondary | ICD-10-CM

## 2020-01-22 DIAGNOSIS — G35 Multiple sclerosis: Secondary | ICD-10-CM

## 2020-01-22 DIAGNOSIS — R634 Abnormal weight loss: Secondary | ICD-10-CM

## 2020-01-22 DIAGNOSIS — Z85028 Personal history of other malignant neoplasm of stomach: Secondary | ICD-10-CM | POA: Diagnosis not present

## 2020-01-22 DIAGNOSIS — E7800 Pure hypercholesterolemia, unspecified: Secondary | ICD-10-CM

## 2020-01-22 LAB — POCT URINALYSIS DIPSTICK
Appearance: NEGATIVE
Bilirubin, UA: NEGATIVE
Blood, UA: NEGATIVE
Glucose, UA: NEGATIVE
Ketones, UA: NEGATIVE
Leukocytes, UA: NEGATIVE
Nitrite, UA: NEGATIVE
Odor: NEGATIVE
Protein, UA: NEGATIVE
Spec Grav, UA: 1.015 (ref 1.010–1.025)
Urobilinogen, UA: 0.2 E.U./dL
pH, UA: 6.5 (ref 5.0–8.0)

## 2020-01-22 MED ORDER — METHYLPREDNISOLONE ACETATE 80 MG/ML IJ SUSP
80.0000 mg | Freq: Once | INTRAMUSCULAR | Status: AC
  Administered 2020-01-22: 80 mg via INTRAMUSCULAR

## 2020-01-22 MED ORDER — CEFTRIAXONE SODIUM 1 G IJ SOLR
1.0000 g | Freq: Once | INTRAMUSCULAR | Status: AC
  Administered 2020-01-22: 1 g via INTRAMUSCULAR

## 2020-01-22 MED ORDER — AZITHROMYCIN 250 MG PO TABS: ORAL_TABLET | ORAL | 0 refills | Status: DC

## 2020-01-22 NOTE — Progress Notes (Signed)
Subjective:    Patient ID: Erika Murphy, female    DOB: October 02, 1950, 70 y.o.   MRN: [id number]  HPI 70 year old Female seen for Medicare wellness exam, health maintenance exam and evaluation of medical issues.  Her weight is 90 pounds.  In November 2021 she weighed 93 pounds.  In August 2021 she weighed 94 pounds.  In July 2021 she weighed 95 pounds.  She is not sure why she has lost weight.  She is complaining of a cough.  She had a Covid test that was negative.  Has an inhaler but has not been using it correctly.  We demonstrated how to use it correctly.  In January 2021 she weighed 100 pounds.  Maintaining her weight has been an issue since surgery for carcinoma of the stomach with subtotal gastrectomy and partial omentectomy in 2001.  History of H. pylori infection in 2006  History of osteopenia  No known drug allergies  History of multiple sclerosis diagnosed in 1998.  She falls sometimes.  History of spastic gait disorder with lower distal weakness and proximal weakness.  Dr. Erling Cruz, neurologist diagnosed her with multiple sclerosis.  She presented with dragging her right leg.  She took Betaseron for a while but did not like the way it made her feel and she discontinued it.  She ambulates with a cane due to gait disorder.  History of neuropathic pain involving upper and lower extremities treated with Lyrica.  History of left shoulder arthroplasty by Dr. Veverly Fells in 2011.  History of vitamin B12 deficiency due to gastrectomy which she had due to gastric cancer.  Family member gives her monthly B12 injections.  She has had some memory issues in the past.  An MRI of the brain without contrast in January 2018 was stable compared to one done in 2015.  In 2016 she had right total hip arthroplasty for right femoral fracture.  She is followed by Dr. Marin Olp, oncologist for history of gastric cancer.  Sometimes has urinary tract infection secondary to neurogenic bladder from multiple  sclerosis.  She had colonoscopy  and endoscopy in 2019 by Dr. Benson Norway with no evidence of recurrent cancer.  She does not drive and never has treatment.  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  She is now disabled.  Does not smoke or consume alcohol.  She resides alone.  Family history: 1 sister died status post leg amputation with history of diabetes sister diagnosed with breast cancer.  Brother died with metastatic cancer but primary not known to patient.    Review of Systems complaint of SOB, has had nagging cough. Has albuterol inhaler     Objective:   Physical Exam Patient has lost 3 pounds since December 2021.  This is a bit concerning.  Skin is warm and dry.  She looks thin.  TMs are clear.  Neck is supple.  No thyromegaly.  No carotid bruits.  Chest is clear to auscultation.  Cardiac exam regular rate and rhythm.  Abdomen soft nondistended without masses.  No lower extremity pitting edema.  Cranial nerves II through XII are grossly intact.  Moves all 4 extremities.  No evidence of decreased sensation in the extremities.  Tends to drag her right leg.  Affect is normal.  She is pleasant and cooperative.       Assessment & Plan:  Weight loss-unexplained.  BMI is 14.98.  Albumin was checked in January and  was 19 and had been 22 7 months previously.  Prealbumins have ranged between 18 and 22 over the past several years.  Because of the symptoms I am going to refer her back to Dr. Ardell Isaacs for evaluation.  She will also be seeing Dr. Marin Olp in the near future.  She had CT of abdomen and pelvis February 11 that showed no acute intra-abdominal or intrapelvic abnormality but does have aortic atherosclerosis.  History of gastric cancer followed by Dr. Marin Olp  Multiple sclerosis treated by [facility]-patient took her self off of Betaseron a number of years ago.  Just on Lyrica 3 times daily for musculoskeletal  pain.  Hyperlipidemia treated with Crestor 5 mg weekly with supper since January 2022.  Her LDL had increased from 106 7 months ago to 133.  She had similar LDLs a year ago and 2 years ago.  Complaint of cough-was not using inhaler correctly and we showed her how to use it correctly today.  Had recent negative Covid test.  She had BNP in January that was normal at 26.  She had a chest x-ray in January that showed no evidence of heart failure.  She had mammogram in November 2021 which was normal.  Plan: We will have GI consultation in the near future.  We need to continue to monitor her weight monthly.  May need to see nutritionist.  TSH checked with this exam is normal at 0.82.  Subjective:   Patient presents for Medicare Annual/Subsequent preventive examination.  Review Past Medical/Family/Social: See above   Risk Factors  Current exercise habits: Unable to exercise due to multiple sclerosis Dietary issues discussed: Yes  Cardiac risk factors: Hyperlipidemia  Depression Screen  (Note: if answer to either of the following is "Yes", a more complete depression screening is indicated)   Over the past two weeks, have you felt down, depressed or hopeless? No  Over the past two weeks, have you felt little interest or pleasure in doing things? No Have you lost interest or pleasure in daily life? No Do you often feel hopeless? No Do you cry easily over simple problems? No   Activities of Daily Living  In your present state of health, do you have any difficulty performing the following activities?:   Driving?  Has never driven Managing money? No  Feeding yourself? No  Getting from bed to chair? No  Climbing a flight of stairs? No  Preparing food and eating?: No  Bathing or showering? No  Getting dressed: No  Getting to the toilet? No  Using the toilet:No  Moving around from place to place: No  In the past year have you fallen or had a near fall?:  A near fall Are you sexually  active? No  Do you have more than one partner? No   Hearing Difficulties: No  Do you often ask people to speak up or repeat themselves? No  Do you experience ringing or noises in your ears? No  Do you have difficulty understanding soft or whispered voices? No  Do you feel that you have a problem with memory?  Sometimes Do you often misplace items?  Sometimes   Home Safety:  Do you have a smoke alarm at your residence? Yes Do you have grab bars in the bathroom?  Yes Do you have throw rugs in your house?  No   Cognitive Testing  Alert? Yes Normal Appearance?Yes  Oriented to person? Yes Place? Yes  Time? Yes  Recall of three objects?  Not  tested Can perform simple calculations? Yes  Displays appropriate judgment?Yes  Can read the correct time from a watch face?Yes   List the Names of Other Physician/Practitioners you currently use:  See referral list for the physicians patient is currently seeing.  Dr. Marin Olp and [facility]   Review of Systems: See above   Objective:     General appearance: Appears stated age and mildly obese  Head: Normocephalic, without obvious abnormality, atraumatic  Eyes: conj clear, EOMi PEERLA  Ears: normal TM's and external ear canals both ears  Nose: Nares normal. Septum midline. Mucosa normal. No drainage or sinus tenderness.  Throat: lips, mucosa, and tongue normal; teeth and gums normal  Neck: no adenopathy, no carotid bruit, no JVD, supple, symmetrical, trachea midline and thyroid not enlarged, symmetric, no tenderness/mass/nodules  No CVA tenderness.  Lungs: clear to auscultation bilaterally  Breasts: Small with normal appearance, no masses or tenderness Heart: regular rate and rhythm, S1, S2 normal, no murmur, click, rub or gallop  Abdomen: soft, non-tender; bowel sounds normal; no masses, no organomegaly  Musculoskeletal: ROM normal in all joints, no crepitus, no deformity, Normal muscle strengthen. Back  is symmetric, no  curvature. Skin: Skin color, texture, turgor normal. No rashes or lesions  Lymph nodes: Cervical, supraclavicular, and axillary nodes normal.  Neurologic: CN 2 -12 Normal, Normal symmetric reflexes. Normal coordination and gait  Psych: Alert & Oriented x 3, Mood appear stable.    Assessment:    Annual wellness medicare exam   Plan:    During the course of the visit the patient was educated and counseled about appropriate screening and preventive services including:   Mammogram done November 2021.  Last colonoscopy was in 2019. Has had 3 COVID immunizations.  Has had Prevnar 13 and pneumococcal 23 vaccines.  Tetanus immunization is up-to-date.  Gets annual flu vaccine but did not get one this year with the pandemic as we have not seen any fluid at all.    Patient Instructions (the written plan) was given to the patient.  Medicare Attestation  I have personally reviewed:  The patient's medical and social history  Their use of alcohol, tobacco or illicit drugs  Their current medications and supplements  The patient's functional ability including ADLs,fall risks, home safety risks, cognitive, and hearing and visual impairment  Diet and physical activities  Evidence for depression or mood disorders  The patient's weight, height, BMI, and visual acuity have been recorded in the chart. I have made referrals, counseling, and provided education to the patient based on review of the above and I have provided the patient with a written personalized care plan for preventive services.

## 2020-01-23 LAB — PATHOLOGIST SMEAR REVIEW

## 2020-01-23 LAB — CBC WITH DIFFERENTIAL/PLATELET
Absolute Monocytes: 305 cells/uL (ref 200–950)
Basophils Absolute: 32 cells/uL (ref 0–200)
Basophils Relative: 1.1 %
Eosinophils Absolute: 32 cells/uL (ref 15–500)
Eosinophils Relative: 1.1 %
HCT: 40.8 % (ref 35.0–45.0)
Hemoglobin: 13.2 g/dL (ref 11.7–15.5)
Lymphs Abs: 1128 cells/uL (ref 850–3900)
MCH: 29.8 pg (ref 27.0–33.0)
MCHC: 32.4 g/dL (ref 32.0–36.0)
MCV: 92.1 fL (ref 80.0–100.0)
MPV: 10.9 fL (ref 7.5–12.5)
Monocytes Relative: 10.5 %
Neutro Abs: 1404 cells/uL — ABNORMAL LOW (ref 1500–7800)
Neutrophils Relative %: 48.4 %
Platelets: 214 10*3/uL (ref 140–400)
RBC: 4.43 10*6/uL (ref 3.80–5.10)
RDW: 11.6 % (ref 11.0–15.0)
Total Lymphocyte: 38.9 %
WBC: 2.9 10*3/uL — ABNORMAL LOW (ref 3.8–10.8)

## 2020-01-23 LAB — PREALBUMIN: Prealbumin: 19 mg/dL (ref 17–34)

## 2020-01-23 LAB — BRAIN NATRIURETIC PEPTIDE: Brain Natriuretic Peptide: 26 pg/mL (ref <100)

## 2020-02-06 ENCOUNTER — Telehealth: Payer: Self-pay

## 2020-02-06 NOTE — Telephone Encounter (Signed)
Patient called states she is still not feeling better, she said her stomach is bothering her and can't eat bc her stomach hurts and still has sinus symptoms her sinuses are draining and has SOB. She would like to be seen before the 21st here. She said she has an appointment with Dr. Marin Olp on 2/18.

## 2020-02-06 NOTE — Telephone Encounter (Signed)
Appointment scheduled for 02/09/20.

## 2020-02-06 NOTE — Telephone Encounter (Signed)
Her CXR was OK.  See next week but likely will need referral to GI

## 2020-02-09 ENCOUNTER — Other Ambulatory Visit: Payer: Self-pay

## 2020-02-09 ENCOUNTER — Ambulatory Visit (INDEPENDENT_AMBULATORY_CARE_PROVIDER_SITE_OTHER): Payer: Medicare Other | Admitting: Internal Medicine

## 2020-02-09 ENCOUNTER — Encounter: Payer: Self-pay | Admitting: Internal Medicine

## 2020-02-09 VITALS — BP 110/80 | HR 79 | Temp 98.0°F | Ht 65.0 in | Wt 88.0 lb

## 2020-02-09 DIAGNOSIS — R1032 Left lower quadrant pain: Secondary | ICD-10-CM | POA: Diagnosis not present

## 2020-02-09 MED ORDER — METHYLPREDNISOLONE ACETATE 80 MG/ML IJ SUSP
80.0000 mg | Freq: Once | INTRAMUSCULAR | Status: AC
  Administered 2020-02-09: 80 mg via INTRAMUSCULAR

## 2020-02-09 MED ORDER — ALPRAZOLAM 0.25 MG PO TABS: 0.2500 mg | ORAL_TABLET | Freq: Every evening | ORAL | 2 refills | Status: DC | PRN

## 2020-02-09 NOTE — Patient Instructions (Signed)
CT of abdomen and pelvis ordered with contrast. Refer back to Dr. Benson Norway re: abdominal pain. Depomedrol 80 mg IM for persistent respiratory congestion. Xanax 0.25 mg at bedtime as needed for anxiety and sleep.

## 2020-02-09 NOTE — Progress Notes (Signed)
   Subjective:    Patient ID: Erika Murphy, female    DOB: 1950/05/16, 70 y.o.   MRN: [id number]  HPI She was here January 20 th for Annual wellness visit. Weight at that time was 90 pounds.  She called back on February 4 saying she was not feeling any better.  Says she cannot eat because her stomach hurts.  Says sinuses are draining and she has been short of breath.   In July, weight was 95 pounds. Prealbumin in January was low normal at 19. Highest it has been was 22 when checked 7 months ago. Hx of neutropenia and WBC checked in January 2022 was 2900 and had been 2300 in January 2021.Had path review of smear favoring reactive process. Was treated for respiratory infection. BNP was normal.  Still having issues with generalized abdominal pain. Has appetite. No nausea. No issues with bowels she says. No rectal bleeding.No melena.  She still complains of respiratory congestion. Depomedrol at last visit helped some.  Dr. Benson Norway is Gastroenterologist and she will be referred back to him. Has appt with Dr. Marin Olp later this month.    Review of Systems some issues with insomnia. Xanax ordered. Neurology tried her on pamelor but she says it caused drowsiness.     Objective:   Physical Exam Blood pressure 110/80 pulse 79 temperature 98 degrees orally pulse oximetry 99% weight is now 88 pounds BMI 14.68.  Has lost 2 pounds since January 20th.  Frail looking Female.  Currently in no respiratory distress.  Skin is warm and dry.  No cervical adenopathy or thyromegaly.  Chest is clear to auscultation.  Cardiac exam regular rate and rhythm.  Abdomen scaphoid soft.  Maybe some mild epigastric pain but no rebound tenderness.  No lower extremity edema.  She is not be in severe distress with her abdominal pain and is talkative today.       Assessment & Plan:  Weight loss and persistent abdominal pain-etiology unclear but obviously want to make sure that her GI malignancy has not returned  Plan: She will  be seeing Dr. Marin Olp later this month and I am referring her back to Dr. Almyra Free for consideration of endoscopy and colonoscopy with weight loss.  She may need to see dietitian regarding caloric intake.  Her white blood cell count is 3000 and generally she has neutropenia with white cell count running between 1900 and 3000 over the past few months.  Continue Lyrica for pain associated with multiple sclerosis.  Continue Nexium 40 mg daily.  She is having some issues sleeping and I am given her Xanax to take at bedtime.  Also she has an albuterol inhaler on hand for shortness of breath but has not been using it.  She is supposed be taking Lyrica 50 mg 3 times a day for musculoskeletal pain associated with multiple sclerosis.  Continue Crestor 5 mg weekly with supper for hyperlipidemia.  Order CT of the abdomen and pelvis with contrast.  Given another Depo-Medrol injection 80 mg IM for persistent respiratory congestion.  Chest x-ray January 20 showed hyperinflation but no infiltrate or cardiopulmonary disease.

## 2020-02-10 LAB — CBC WITH DIFFERENTIAL/PLATELET
Absolute Monocytes: 297 cells/uL (ref 200–950)
Basophils Absolute: 21 cells/uL (ref 0–200)
Basophils Relative: 0.7 %
Eosinophils Absolute: 48 cells/uL (ref 15–500)
Eosinophils Relative: 1.6 %
HCT: 38.3 % (ref 35.0–45.0)
Hemoglobin: 12.7 g/dL (ref 11.7–15.5)
Lymphs Abs: 1125 cells/uL (ref 850–3900)
MCH: 30.8 pg (ref 27.0–33.0)
MCHC: 33.2 g/dL (ref 32.0–36.0)
MCV: 92.7 fL (ref 80.0–100.0)
MPV: 11 fL (ref 7.5–12.5)
Monocytes Relative: 9.9 %
Neutro Abs: 1509 cells/uL (ref 1500–7800)
Neutrophils Relative %: 50.3 %
Platelets: 178 10*3/uL (ref 140–400)
RBC: 4.13 10*6/uL (ref 3.80–5.10)
RDW: 11.4 % (ref 11.0–15.0)
Total Lymphocyte: 37.5 %
WBC: 3 10*3/uL — ABNORMAL LOW (ref 3.8–10.8)

## 2020-02-10 LAB — BASIC METABOLIC PANEL
BUN: 17 mg/dL (ref 7–25)
CO2: 28 mmol/L (ref 20–32)
Calcium: 8.7 mg/dL (ref 8.6–10.4)
Chloride: 106 mmol/L (ref 98–110)
Creat: 0.79 mg/dL (ref 0.60–0.93)
Glucose, Bld: 79 mg/dL (ref 65–99)
Potassium: 4.9 mmol/L (ref 3.5–5.3)
Sodium: 140 mmol/L (ref 135–146)

## 2020-02-13 ENCOUNTER — Other Ambulatory Visit: Payer: Self-pay

## 2020-02-13 ENCOUNTER — Ambulatory Visit (HOSPITAL_COMMUNITY)
Admission: RE | Admit: 2020-02-13 | Discharge: 2020-02-13 | Disposition: A | Discharge status: Home / Self Care | Payer: Medicare Other | Source: Ambulatory Visit | Attending: Internal Medicine | Admitting: Internal Medicine

## 2020-02-13 DIAGNOSIS — R1032 Left lower quadrant pain: Secondary | ICD-10-CM | POA: Diagnosis not present

## 2020-02-13 DIAGNOSIS — I700 Atherosclerosis of aorta: Secondary | ICD-10-CM | POA: Diagnosis not present

## 2020-02-13 DIAGNOSIS — Z85028 Personal history of other malignant neoplasm of stomach: Secondary | ICD-10-CM | POA: Diagnosis not present

## 2020-02-13 MED ORDER — IOHEXOL 300 MG/ML  SOLN
100.0000 mL | Freq: Once | INTRAMUSCULAR | Status: AC | PRN
  Administered 2020-02-13: 80 mL via INTRAVENOUS

## 2020-02-16 DIAGNOSIS — Z85028 Personal history of other malignant neoplasm of stomach: Secondary | ICD-10-CM | POA: Diagnosis not present

## 2020-02-16 DIAGNOSIS — R1033 Periumbilical pain: Secondary | ICD-10-CM | POA: Diagnosis not present

## 2020-02-16 DIAGNOSIS — R634 Abnormal weight loss: Secondary | ICD-10-CM | POA: Diagnosis not present

## 2020-02-20 ENCOUNTER — Inpatient Hospital Stay (HOSPITAL_BASED_OUTPATIENT_CLINIC_OR_DEPARTMENT_OTHER): Payer: Medicare Other | Admitting: Hematology & Oncology

## 2020-02-20 ENCOUNTER — Inpatient Hospital Stay: Payer: Medicare Other | Attending: Hematology & Oncology

## 2020-02-20 ENCOUNTER — Telehealth: Payer: Self-pay | Admitting: *Deleted

## 2020-02-20 ENCOUNTER — Other Ambulatory Visit: Payer: Self-pay

## 2020-02-20 ENCOUNTER — Encounter: Payer: Self-pay | Admitting: Hematology & Oncology

## 2020-02-20 VITALS — BP 128/69 | HR 83 | Temp 98.3°F | Resp 18 | Wt 85.0 lb

## 2020-02-20 DIAGNOSIS — E43 Unspecified severe protein-calorie malnutrition: Secondary | ICD-10-CM

## 2020-02-20 DIAGNOSIS — G35 Multiple sclerosis: Secondary | ICD-10-CM | POA: Diagnosis not present

## 2020-02-20 DIAGNOSIS — D002 Carcinoma in situ of stomach: Secondary | ICD-10-CM

## 2020-02-20 DIAGNOSIS — D509 Iron deficiency anemia, unspecified: Secondary | ICD-10-CM | POA: Diagnosis not present

## 2020-02-20 DIAGNOSIS — E538 Deficiency of other specified B group vitamins: Secondary | ICD-10-CM

## 2020-02-20 DIAGNOSIS — R634 Abnormal weight loss: Secondary | ICD-10-CM | POA: Diagnosis not present

## 2020-02-20 DIAGNOSIS — D500 Iron deficiency anemia secondary to blood loss (chronic): Secondary | ICD-10-CM

## 2020-02-20 DIAGNOSIS — Z85028 Personal history of other malignant neoplasm of stomach: Secondary | ICD-10-CM | POA: Diagnosis not present

## 2020-02-20 DIAGNOSIS — D510 Vitamin B12 deficiency anemia due to intrinsic factor deficiency: Secondary | ICD-10-CM | POA: Insufficient documentation

## 2020-02-20 LAB — CBC WITH DIFFERENTIAL (CANCER CENTER ONLY)
Abs Immature Granulocytes: 0.03 10*3/uL (ref 0.00–0.07)
Basophils Absolute: 0 10*3/uL (ref 0.0–0.1)
Basophils Relative: 0 %
Eosinophils Absolute: 0.1 10*3/uL (ref 0.0–0.5)
Eosinophils Relative: 2 %
HCT: 43.2 % (ref 36.0–46.0)
Hemoglobin: 13.8 g/dL (ref 12.0–15.0)
Immature Granulocytes: 1 %
Lymphocytes Relative: 43 %
Lymphs Abs: 1.5 10*3/uL (ref 0.7–4.0)
MCH: 30.1 pg (ref 26.0–34.0)
MCHC: 31.9 g/dL (ref 30.0–36.0)
MCV: 94.1 fL (ref 80.0–100.0)
Monocytes Absolute: 0.3 10*3/uL (ref 0.1–1.0)
Monocytes Relative: 9 %
Neutro Abs: 1.5 10*3/uL — ABNORMAL LOW (ref 1.7–7.7)
Neutrophils Relative %: 45 %
Platelet Count: 196 10*3/uL (ref 150–400)
RBC: 4.59 MIL/uL (ref 3.87–5.11)
RDW: 12 % (ref 11.5–15.5)
WBC Count: 3.4 10*3/uL — ABNORMAL LOW (ref 4.0–10.5)
nRBC: 0 % (ref 0.0–0.2)

## 2020-02-20 LAB — CMP (CANCER CENTER ONLY)
ALT: 23 U/L (ref 0–44)
AST: 27 U/L (ref 15–41)
Albumin: 4.5 g/dL (ref 3.5–5.0)
Alkaline Phosphatase: 61 U/L (ref 38–126)
Anion gap: 6 (ref 5–15)
BUN: 22 mg/dL (ref 8–23)
CO2: 29 mmol/L (ref 22–32)
Calcium: 9.5 mg/dL (ref 8.9–10.3)
Chloride: 105 mmol/L (ref 98–111)
Creatinine: 0.97 mg/dL (ref 0.44–1.00)
GFR, Estimated: >60 mL/min (ref >60)
Glucose, Bld: 102 mg/dL — ABNORMAL HIGH (ref 70–99)
Potassium: 4.9 mmol/L (ref 3.5–5.1)
Sodium: 140 mmol/L (ref 135–145)
Total Bilirubin: 0.8 mg/dL (ref 0.3–1.2)
Total Protein: 8 g/dL (ref 6.5–8.1)

## 2020-02-20 LAB — IRON AND TIBC
Iron: 120 ug/dL (ref 41–142)
Saturation Ratios: 34 % (ref 21–57)
TIBC: 351 ug/dL (ref 236–444)
UIBC: 231 ug/dL (ref 120–384)

## 2020-02-20 LAB — RETICULOCYTES
Immature Retic Fract: 6.2 % (ref 2.3–15.9)
RBC.: 4.52 MIL/uL (ref 3.87–5.11)
Retic Count, Absolute: 52 10*3/uL (ref 19.0–186.0)
Retic Ct Pct: 1.2 % (ref 0.4–3.1)

## 2020-02-20 LAB — FERRITIN: Ferritin: 230 ng/mL (ref 11–307)

## 2020-02-20 MED ORDER — PANCRELIPASE (LIP-PROT-AMYL) 36000-114000 UNITS PO CPEP: ORAL_CAPSULE | ORAL | 11 refills | Status: DC

## 2020-02-20 NOTE — Telephone Encounter (Signed)
Per los 02/20/20 gave patient upcoming apts with calendar - view mychart

## 2020-02-20 NOTE — Progress Notes (Signed)
Hematology and Oncology Follow Up Visit  TARAE WOODEN [id number] [birth year] 70 y.o. 02/20/2020   Principle Diagnosis:   Stage II (T3N0M0) adenocarcinoma of the stomach-remission Recurrent iron deficiency anemia  Pernicious anemia Multiple sclerosis Current Therapy:    IV iron as indicated  Vitamin B-12 1 mg IM every month     Interim History:  Ms.  Wiens is back for follow-up.  The real problem that we have now is her weight loss.  She is is not had a 5 pounds.  When we saw her 6 months ago, she was 96 pounds.  This is quite troublesome.  She is being followed very closely by Dr. Renold Genta.  She had a CT of the abdomen done recently.  This was unremarkable.  She is not having diarrhea.  She has not a vegetarian.  She seems to be eating okay.  There has been no flareups of the multiple sclerosis.  She sees Dr. Carol Ada for a endoscopy I think in a couple weeks.  She has had no fever.  I am not sure if her thyroid has been checked.  There has been no indication of any malignancy.  She has had no problems with her stomach cancer for over 15 years.  There is been no bleeding.  She has had no problems with the coronavirus.  Overall, I would say her performance status is ECOG 2.   Medications:  Current Outpatient Medications:  .  albuterol (VENTOLIN HFA) 108 (90 Base) MCG/ACT inhaler, TAKE 2 PUFFS BY MOUTH EVERY 6 HOURS AS NEEDED FOR WHEEZE OR SHORTNESS OF BREATH, Disp: 8 g, Rfl: 11 .  ALPRAZolam (XANAX) 0.25 MG tablet, Take 1 tablet (0.25 mg total) by mouth at bedtime as needed for anxiety., Disp: 30 tablet, Rfl: 2 .  amLODipine (NORVASC) 2.5 MG tablet, TAKE 1 TABLET BY MOUTH EVERY DAY, Disp: 90 tablet, Rfl: 3 .  cyanocobalamin (,VITAMIN B-12,) 1000 MCG/ML injection, INJECT 1 ML (1,000 MCG TOTAL) INTO THE MUSCLE ONCE A MONTH AS DIRECTED, Disp: 3 mL, Rfl: 3 .  esomeprazole (NEXIUM) 40 MG capsule, TAKE 1 CAPSULE (40 MG TOTAL) BY MOUTH DAILY AT 12 NOON., Disp: 90 capsule, Rfl: 3 .   meclizine (ANTIVERT) 25 MG tablet, Take 1 tablet (25 mg total) by mouth 3 (three) times daily as needed for dizziness., Disp: 30 tablet, Rfl: 0 .  Multiple Vitamins-Minerals (MULTIVITAMIN GUMMIES WOMENS PO), Take 1 each by mouth daily. , Disp: , Rfl:  .  pregabalin (LYRICA) 50 MG capsule, One po 3 times daily for pain associated with multiple sclerosis, Disp: 90 capsule, Rfl: 5 .  rosuvastatin (CRESTOR) 5 MG tablet, TAKE ONE TABLET BY MOUTH WEEKLY WITH SUPPER FOR HYPERCHOLESTEROLEMIA, Disp: 12 tablet, Rfl: 3  Allergies: No Known Allergies  Past Medical History, Surgical history, Social history, and Family History were reviewed and updated.  Review of Systems: Review of Systems  Constitutional: Positive for malaise/fatigue.  HENT: Positive for tinnitus.   Eyes: Positive for blurred vision.  Respiratory: Negative.   Cardiovascular: Negative.   Gastrointestinal: Positive for diarrhea.  Genitourinary: Negative.   Musculoskeletal: Positive for myalgias.  Skin: Negative.   Neurological: Positive for tingling and weakness.  Endo/Heme/Allergies: Negative.   Psychiatric/Behavioral: Negative.     Physical Exam:  weight is 85 lb (38.6 kg). Her oral temperature is 98.3 F (36.8 C). Her blood pressure is 128/69 and her pulse is 83. Her respiration is 18 and oxygen saturation is 100%.   Physical Exam Vitals reviewed.  HENT:  Head: Normocephalic and atraumatic.  Eyes:     Pupils: Pupils are equal, round, and reactive to light.  Cardiovascular:     Rate and Rhythm: Normal rate and regular rhythm.     Heart sounds: Normal heart sounds.  Pulmonary:     Effort: Pulmonary effort is normal.     Breath sounds: Normal breath sounds.  Abdominal:     General: Bowel sounds are normal.     Palpations: Abdomen is soft.  Musculoskeletal:        General: No tenderness or deformity. Normal range of motion.     Cervical back: Normal range of motion.  Lymphadenopathy:     Cervical: No cervical  adenopathy.  Skin:    General: Skin is warm and dry.     Findings: No erythema or rash.  Neurological:     Mental Status: She is alert and oriented to person, place, and time.  Psychiatric:        Behavior: Behavior normal.        Thought Content: Thought content normal.        Judgment: Judgment normal.      Lab Results  Component Value Date   WBC 3.4 (L) 02/20/2020   HGB 13.8 02/20/2020   HCT 43.2 02/20/2020   MCV 94.1 02/20/2020   PLT 196 02/20/2020     Chemistry      Component Value Date/Time   NA 140 02/09/2020 1119   NA 144 06/12/2016 1151   NA 142 06/11/2015 1156   K 4.9 02/09/2020 1119   K 4.7 06/12/2016 1151   K 4.3 06/11/2015 1156   CL 106 02/09/2020 1119   CL 107 06/12/2016 1151   CO2 28 02/09/2020 1119   CO2 32 06/12/2016 1151   CO2 29 06/11/2015 1156   BUN 17 02/09/2020 1119   BUN 11 06/12/2016 1151   BUN 13.5 06/11/2015 1156   CREATININE 0.79 02/09/2020 1119   CREATININE 1.2 (H) 06/11/2015 1156      Component Value Date/Time   CALCIUM 8.7 02/09/2020 1119   CALCIUM 9.6 06/12/2016 1151   CALCIUM 9.7 06/11/2015 1156   ALKPHOS 102 08/20/2019 0916   ALKPHOS 97 (H) 06/12/2016 1151   ALKPHOS 111 06/11/2015 1156   AST 19 01/16/2020 0949   AST 28 08/20/2019 0916   AST 30 06/11/2015 1156   ALT 13 01/16/2020 0949   ALT 23 08/20/2019 0916   ALT 26 06/12/2016 1151   ALT 23 06/11/2015 1156   BILITOT 0.7 01/16/2020 0949   BILITOT 0.6 08/20/2019 0916   BILITOT 1.03 06/11/2015 1156      Impression and Plan: Ms. Veno is a 70 year old African [place] female. She had a history of stage II stomach cancer. She underwent resection. She had adjuvant therapy. This is now  20 years out from treatment. She is cured of the stomach cancer.   Again, I am not sure as to why she has a weight loss.  Her albumin is quite good.  I would think malabsorption would be unusual but certainly a possibility.  Because of this, I will try her on Creon.  This might help a little  bit.  I do not see a downside to her being on Creon.  We will have to see what the endoscopy shows.  I just hate that she is losing the weight.  We will get her back in 6 weeks now.  This is more complicated than I had thought.     Rudell Cobb  Marin Olp, MD 2/18/202210:09 AM

## 2020-02-21 NOTE — Patient Instructions (Addendum)
We continue to be concerned about your weight loss.  We are referring you to Dr. Benson Norway for evaluation.  You may need to see nutritionist.  Follow-up with weight check here February 21.

## 2020-02-23 ENCOUNTER — Ambulatory Visit (INDEPENDENT_AMBULATORY_CARE_PROVIDER_SITE_OTHER): Payer: Medicare Other | Admitting: Internal Medicine

## 2020-02-23 ENCOUNTER — Encounter: Payer: Self-pay | Admitting: Internal Medicine

## 2020-02-23 ENCOUNTER — Other Ambulatory Visit: Payer: Self-pay

## 2020-02-23 VITALS — BP 100/70 | HR 70 | Ht 65.0 in | Wt 86.0 lb

## 2020-02-23 DIAGNOSIS — G35 Multiple sclerosis: Secondary | ICD-10-CM

## 2020-02-23 DIAGNOSIS — I10 Essential (primary) hypertension: Secondary | ICD-10-CM | POA: Diagnosis not present

## 2020-02-23 DIAGNOSIS — R636 Underweight: Secondary | ICD-10-CM

## 2020-02-23 DIAGNOSIS — Z903 Acquired absence of stomach [part of]: Secondary | ICD-10-CM | POA: Diagnosis not present

## 2020-02-23 DIAGNOSIS — R634 Abnormal weight loss: Secondary | ICD-10-CM | POA: Diagnosis not present

## 2020-02-23 DIAGNOSIS — Z85028 Personal history of other malignant neoplasm of stomach: Secondary | ICD-10-CM | POA: Diagnosis not present

## 2020-02-23 NOTE — Progress Notes (Signed)
   Subjective:    Patient ID: Erika Murphy, female    DOB: 02-Jun-1950, 70 y.o.   MRN: [id number]  HPI 70 year old Female for follow up on weight loss. Will be having endoscopy/colonoscopy by Dr. Benson Norway March 1st.  Having come constipation. This is a new complaint.Recommended laxative to get cleaned out then may try Miralax daily to prevent this.  TSH was normal is January. Weight was  90 pounds in January. Prealbumin in January was low normal at 19.  Weight  was 88 pounds on February 7th. Weight is now 86 pounds. Interestingly, weight was 93 pounds in December when seen for vertigo. Around that time she decided to let a lady room with her apparently thinking it was a temporary situation according to her daughter,Erika Murphy whom I called today. They apparently stay on the go shopping etc. [name] says her mother eats well when she brings her food. Not sure about how well she eats on other days. Is certainly more active. I think the correlation of the weight loss with lady moving into her home may be significant.  Review of Systems see above- she is dressed neatly. Affect is bright- does not appaer to be depressed.     Objective:   Physical Exam  Weight is 86 pounds today. BMI 14.31 BP 100/70 pulse ox 99%. Looks thin but neatly dressed.      Assessment & Plan:  Persistent weight loss. She tells me what she ate yesterday and it sounds sufficient but do not know if it is consistent. I am referring her to dietician. She has upcoming endoscopy and colonoscopy. Dr, Marin Olp placed her on Creon recently which is a good idea, She continues to complain of constipation. Advised taking Dulcolax once to get cleaned out. Spoke with her daughter by phone today. She will help monitor patient's caloric intake. I also want her to go with Mom to dietician. Follow up here in 4-6 weeks.

## 2020-02-23 NOTE — Patient Instructions (Addendum)
Referral to Nutritionist regarding weight loss. Has endoscopy and colonoscopy with Dr. Benson Norway on March 1st. TSH was checked in January. Follow up in 4-6 weeks.Spoke with daughter today regarding weight loss.

## 2020-02-24 ENCOUNTER — Telehealth: Payer: Self-pay | Admitting: Internal Medicine

## 2020-02-24 NOTE — Telephone Encounter (Signed)
Scheduled

## 2020-02-24 NOTE — Telephone Encounter (Signed)
Referred patient to go to Dietician about weight loss and her Medicare does not cover for this unless , diabetic, renal failure or kidney disease.

## 2020-02-24 NOTE — Telephone Encounter (Signed)
LVM to CB to schedule 6 week follow up for weight check  Around April 4

## 2020-03-02 DIAGNOSIS — R1033 Periumbilical pain: Secondary | ICD-10-CM | POA: Diagnosis not present

## 2020-03-02 DIAGNOSIS — R634 Abnormal weight loss: Secondary | ICD-10-CM | POA: Diagnosis not present

## 2020-03-02 DIAGNOSIS — K219 Gastro-esophageal reflux disease without esophagitis: Secondary | ICD-10-CM | POA: Diagnosis not present

## 2020-03-02 DIAGNOSIS — Z85028 Personal history of other malignant neoplasm of stomach: Secondary | ICD-10-CM | POA: Diagnosis not present

## 2020-03-02 DIAGNOSIS — Z980 Intestinal bypass and anastomosis status: Secondary | ICD-10-CM | POA: Diagnosis not present

## 2020-03-02 LAB — COLOGUARD: Cologuard: NEGATIVE

## 2020-03-03 ENCOUNTER — Encounter: Payer: Self-pay | Admitting: Internal Medicine

## 2020-03-13 ENCOUNTER — Other Ambulatory Visit: Payer: Self-pay | Admitting: Internal Medicine

## 2020-03-31 DIAGNOSIS — R634 Abnormal weight loss: Secondary | ICD-10-CM | POA: Diagnosis not present

## 2020-03-31 DIAGNOSIS — R1033 Periumbilical pain: Secondary | ICD-10-CM | POA: Diagnosis not present

## 2020-04-02 ENCOUNTER — Inpatient Hospital Stay: Payer: Medicare Other | Attending: Hematology & Oncology

## 2020-04-02 ENCOUNTER — Other Ambulatory Visit: Payer: Self-pay

## 2020-04-02 ENCOUNTER — Inpatient Hospital Stay: Payer: Medicare Other | Admitting: Hematology & Oncology

## 2020-04-02 ENCOUNTER — Encounter: Payer: Self-pay | Admitting: Hematology & Oncology

## 2020-04-02 ENCOUNTER — Telehealth: Payer: Self-pay | Admitting: *Deleted

## 2020-04-02 VITALS — BP 137/59 | HR 64 | Temp 98.0°F | Resp 18 | Wt 89.0 lb

## 2020-04-02 DIAGNOSIS — D002 Carcinoma in situ of stomach: Secondary | ICD-10-CM

## 2020-04-02 DIAGNOSIS — G35 Multiple sclerosis: Secondary | ICD-10-CM | POA: Insufficient documentation

## 2020-04-02 DIAGNOSIS — D500 Iron deficiency anemia secondary to blood loss (chronic): Secondary | ICD-10-CM

## 2020-04-02 DIAGNOSIS — Z85028 Personal history of other malignant neoplasm of stomach: Secondary | ICD-10-CM | POA: Insufficient documentation

## 2020-04-02 DIAGNOSIS — D510 Vitamin B12 deficiency anemia due to intrinsic factor deficiency: Secondary | ICD-10-CM | POA: Diagnosis not present

## 2020-04-02 DIAGNOSIS — E43 Unspecified severe protein-calorie malnutrition: Secondary | ICD-10-CM

## 2020-04-02 DIAGNOSIS — E538 Deficiency of other specified B group vitamins: Secondary | ICD-10-CM

## 2020-04-02 DIAGNOSIS — D509 Iron deficiency anemia, unspecified: Secondary | ICD-10-CM | POA: Diagnosis not present

## 2020-04-02 LAB — CMP (CANCER CENTER ONLY)
ALT: 31 U/L (ref 0–44)
AST: 33 U/L (ref 15–41)
Albumin: 4.3 g/dL (ref 3.5–5.0)
Alkaline Phosphatase: 52 U/L (ref 38–126)
Anion gap: 6 (ref 5–15)
BUN: 16 mg/dL (ref 8–23)
CO2: 32 mmol/L (ref 22–32)
Calcium: 9.6 mg/dL (ref 8.9–10.3)
Chloride: 104 mmol/L (ref 98–111)
Creatinine: 0.89 mg/dL (ref 0.44–1.00)
GFR, Estimated: >60 mL/min (ref >60)
Glucose, Bld: 93 mg/dL (ref 70–99)
Potassium: 4.3 mmol/L (ref 3.5–5.1)
Sodium: 142 mmol/L (ref 135–145)
Total Bilirubin: 0.9 mg/dL (ref 0.3–1.2)
Total Protein: 7.3 g/dL (ref 6.5–8.1)

## 2020-04-02 LAB — CBC WITH DIFFERENTIAL (CANCER CENTER ONLY)
Abs Immature Granulocytes: 0.03 10*3/uL (ref 0.00–0.07)
Basophils Absolute: 0 10*3/uL (ref 0.0–0.1)
Basophils Relative: 0 %
Eosinophils Absolute: 0.1 10*3/uL (ref 0.0–0.5)
Eosinophils Relative: 2 %
HCT: 37.8 % (ref 36.0–46.0)
Hemoglobin: 12 g/dL (ref 12.0–15.0)
Immature Granulocytes: 1 %
Lymphocytes Relative: 35 %
Lymphs Abs: 1.1 10*3/uL (ref 0.7–4.0)
MCH: 30.4 pg (ref 26.0–34.0)
MCHC: 31.7 g/dL (ref 30.0–36.0)
MCV: 95.7 fL (ref 80.0–100.0)
Monocytes Absolute: 0.4 10*3/uL (ref 0.1–1.0)
Monocytes Relative: 12 %
Neutro Abs: 1.6 10*3/uL — ABNORMAL LOW (ref 1.7–7.7)
Neutrophils Relative %: 50 %
Platelet Count: 163 10*3/uL (ref 150–400)
RBC: 3.95 MIL/uL (ref 3.87–5.11)
RDW: 12.1 % (ref 11.5–15.5)
WBC Count: 3.2 10*3/uL — ABNORMAL LOW (ref 4.0–10.5)
nRBC: 0 % (ref 0.0–0.2)

## 2020-04-02 LAB — TSH: TSH: 1.346 u[IU]/mL (ref 0.308–3.960)

## 2020-04-02 LAB — PREALBUMIN: Prealbumin: 22.1 mg/dL (ref 18–38)

## 2020-04-02 NOTE — Telephone Encounter (Signed)
Per los 04/02/20 gave upcoming appointments - view mychart

## 2020-04-02 NOTE — Progress Notes (Signed)
Hematology and Oncology Follow Up Visit  Erika Murphy [id number] 1950-04-05 70 y.o. 04/02/2020   Principle Diagnosis:   Stage II (T3N0M0) adenocarcinoma of the stomach-remission Recurrent iron deficiency anemia  Pernicious anemia Multiple sclerosis Current Therapy:    IV iron as indicated  Vitamin B-12 1 mg IM every month     Interim History:  Ms.  Erika Murphy is back for follow-up.  Thankfully, her weight is going back up.  I am happy about this.  She feels better.  She is not having any GI symptoms.  She underwent a upper endoscopy and she has had everything looked okay with this.  She is having some problems with her right leg.  I will know if this might be the multiple sclerosis.  We last saw her back in February, her ferritin was 230 with an iron saturation of 34%.  She has not had any issues with bleeding.  There is been no nausea or vomiting.  She has had no issues with her skin.  There is no headache.  There is no rashes.  Overall, her performance status right now is ECOG 2.    Medications:  Current Outpatient Medications:  .  albuterol (VENTOLIN HFA) 108 (90 Base) MCG/ACT inhaler, TAKE 2 PUFFS BY MOUTH EVERY 6 HOURS AS NEEDED FOR WHEEZE OR SHORTNESS OF BREATH, Disp: 8 g, Rfl: 11 .  ALPRAZolam (XANAX) 0.25 MG tablet, Take 1 tablet (0.25 mg total) by mouth at bedtime as needed for anxiety., Disp: 30 tablet, Rfl: 2 .  amLODipine (NORVASC) 2.5 MG tablet, TAKE 1 TABLET BY MOUTH EVERY DAY, Disp: 90 tablet, Rfl: 3 .  cyanocobalamin (,VITAMIN B-12,) 1000 MCG/ML injection, INJECT 1 ML (1,000 MCG TOTAL) INTO THE MUSCLE ONCE A MONTH AS DIRECTED, Disp: 3 mL, Rfl: 3 .  esomeprazole (NEXIUM) 40 MG capsule, TAKE 1 CAPSULE (40 MG TOTAL) BY MOUTH DAILY AT 12 NOON., Disp: 90 capsule, Rfl: 3 .  lipase/protease/amylase (CREON) 36000 UNITS CPEP capsule, Take 2 capsules (72,000 Units total) by mouth 3 (three) times daily with meals. May also take 1 capsule (36,000 Units total) as needed (with  snacks)., Disp: 240 capsule, Rfl: 11 .  meclizine (ANTIVERT) 25 MG tablet, Take 1 tablet (25 mg total) by mouth 3 (three) times daily as needed for dizziness., Disp: 30 tablet, Rfl: 0 .  Multiple Vitamins-Minerals (MULTIVITAMIN GUMMIES WOMENS PO), Take 1 each by mouth daily. , Disp: , Rfl:  .  pregabalin (LYRICA) 50 MG capsule, TAKE 1 CAPSULE BY MOUTH THREE TIMES A DAY, Disp: 90 capsule, Rfl: 2 .  rosuvastatin (CRESTOR) 5 MG tablet, TAKE ONE TABLET BY MOUTH WEEKLY WITH SUPPER FOR HYPERCHOLESTEROLEMIA, Disp: 12 tablet, Rfl: 3  Allergies: No Known Allergies  Past Medical History, Surgical history, Social history, and Family History were reviewed and updated.  Review of Systems: Review of Systems  Constitutional: Positive for malaise/fatigue.  HENT: Positive for tinnitus.   Eyes: Positive for blurred vision.  Respiratory: Negative.   Cardiovascular: Negative.   Gastrointestinal: Positive for diarrhea.  Genitourinary: Negative.   Musculoskeletal: Positive for myalgias.  Skin: Negative.   Neurological: Positive for tingling and weakness.  Endo/Heme/Allergies: Negative.   Psychiatric/Behavioral: Negative.     Physical Exam:  weight is 89 lb (40.4 kg). Her oral temperature is 98 F (36.7 C). Her blood pressure is 137/59 (abnormal) and her pulse is 64. Her respiration is 18 and oxygen saturation is 100%.   Physical Exam Vitals reviewed.  HENT:     Head: Normocephalic and  atraumatic.  Eyes:     Pupils: Pupils are equal, round, and reactive to light.  Cardiovascular:     Rate and Rhythm: Normal rate and regular rhythm.     Heart sounds: Normal heart sounds.  Pulmonary:     Effort: Pulmonary effort is normal.     Breath sounds: Normal breath sounds.  Abdominal:     General: Bowel sounds are normal.     Palpations: Abdomen is soft.  Musculoskeletal:        General: No tenderness or deformity. Normal range of motion.     Cervical back: Normal range of motion.  Lymphadenopathy:      Cervical: No cervical adenopathy.  Skin:    General: Skin is warm and dry.     Findings: No erythema or rash.  Neurological:     Mental Status: She is alert and oriented to person, place, and time.  Psychiatric:        Behavior: Behavior normal.        Thought Content: Thought content normal.        Judgment: Judgment normal.      Lab Results  Component Value Date   WBC 3.2 (L) 04/02/2020   HGB 12.0 04/02/2020   HCT 37.8 04/02/2020   MCV 95.7 04/02/2020   PLT 163 04/02/2020     Chemistry      Component Value Date/Time   NA 142 04/02/2020 0944   NA 144 06/12/2016 1151   NA 142 06/11/2015 1156   K 4.3 04/02/2020 0944   K 4.7 06/12/2016 1151   K 4.3 06/11/2015 1156   CL 104 04/02/2020 0944   CL 107 06/12/2016 1151   CO2 32 04/02/2020 0944   CO2 32 06/12/2016 1151   CO2 29 06/11/2015 1156   BUN 16 04/02/2020 0944   BUN 11 06/12/2016 1151   BUN 13.5 06/11/2015 1156   CREATININE 0.89 04/02/2020 0944   CREATININE 0.79 02/09/2020 1119   CREATININE 1.2 (H) 06/11/2015 1156      Component Value Date/Time   CALCIUM 9.6 04/02/2020 0944   CALCIUM 9.6 06/12/2016 1151   CALCIUM 9.7 06/11/2015 1156   ALKPHOS 52 04/02/2020 0944   ALKPHOS 97 (H) 06/12/2016 1151   ALKPHOS 111 06/11/2015 1156   AST 33 04/02/2020 0944   AST 30 06/11/2015 1156   ALT 31 04/02/2020 0944   ALT 26 06/12/2016 1151   ALT 23 06/11/2015 1156   BILITOT 0.9 04/02/2020 0944   BILITOT 1.03 06/11/2015 1156      Impression and Plan: [name] is a 70 year old African [place] female. She had a history of stage II stomach cancer. She underwent resection. She had adjuvant therapy. This is now  20 years out from treatment. She is cured of the stomach cancer.   Again, her weight is going back up.  Hopefully, this is a good sign for her.  I forgot to mention that she is taking some Creon.  Hopefully this is helping a little bit.  We will now get her back in 3 months.  Hopefully we can move her appointments  out a little bit further.     [name], MD 4/1/202210:54 AM

## 2020-04-03 LAB — T4: T4, Total: 8.3 ug/dL (ref 4.5–12.0)

## 2020-04-05 ENCOUNTER — Ambulatory Visit: Payer: Medicare Other | Admitting: Internal Medicine

## 2020-04-26 ENCOUNTER — Ambulatory Visit: Payer: Medicare Other | Admitting: Internal Medicine

## 2020-05-01 ENCOUNTER — Other Ambulatory Visit: Payer: Self-pay | Admitting: Internal Medicine

## 2020-05-25 ENCOUNTER — Ambulatory Visit: Payer: Medicare Other | Admitting: Internal Medicine

## 2020-05-27 ENCOUNTER — Other Ambulatory Visit: Payer: Self-pay

## 2020-05-27 ENCOUNTER — Ambulatory Visit: Payer: Medicare Other | Attending: Internal Medicine

## 2020-05-27 ENCOUNTER — Other Ambulatory Visit (HOSPITAL_BASED_OUTPATIENT_CLINIC_OR_DEPARTMENT_OTHER): Payer: Self-pay

## 2020-05-27 ENCOUNTER — Encounter: Payer: Self-pay | Admitting: Hematology & Oncology

## 2020-05-27 DIAGNOSIS — Z23 Encounter for immunization: Secondary | ICD-10-CM

## 2020-05-27 MED ORDER — PFIZER-BIONT COVID-19 VAC-TRIS 30 MCG/0.3ML IM SUSP
INTRAMUSCULAR | 0 refills | Status: DC
  Filled 2020-05-27: qty 0.3, 1d supply, fill #0

## 2020-05-27 NOTE — Progress Notes (Signed)
   Covid-19 Vaccination Clinic  Name:  Erika Murphy    MRN: [id number] DOB: 07-29-1950  05/27/2020  [name] was observed post Covid-19 immunization for 15 minutes without incident. She was provided with Vaccine Information Sheet and instruction to access the V-Safe system.   [name] was instructed to call 911 with any severe reactions post vaccine: Marland Kitchen Difficulty breathing  . Swelling of face and throat  . A fast heartbeat  . A bad rash all over body  . Dizziness and weakness   Immunizations Administered    Name Date Dose VIS Date Route   PFIZER Comrnaty(Gray TOP) Covid-19 Vaccine 05/27/2020  2:43 PM 0.3 mL 12/11/2019 Intramuscular   Manufacturer: [place]   Lot: [id number]   NDC: [phone]

## 2020-06-01 ENCOUNTER — Other Ambulatory Visit: Payer: Self-pay

## 2020-06-01 ENCOUNTER — Encounter: Payer: Self-pay | Admitting: Internal Medicine

## 2020-06-01 ENCOUNTER — Ambulatory Visit (INDEPENDENT_AMBULATORY_CARE_PROVIDER_SITE_OTHER): Payer: Medicare Other | Admitting: Internal Medicine

## 2020-06-01 VITALS — BP 100/60 | HR 72 | Temp 97.7°F | Ht 65.0 in | Wt 88.0 lb

## 2020-06-01 DIAGNOSIS — M810 Age-related osteoporosis without current pathological fracture: Secondary | ICD-10-CM

## 2020-06-01 MED ORDER — DENOSUMAB 60 MG/ML ~~LOC~~ SOSY
60.0000 mg | PREFILLED_SYRINGE | Freq: Once | SUBCUTANEOUS | Status: AC
  Administered 2020-06-01: 60 mg via SUBCUTANEOUS

## 2020-06-01 NOTE — Progress Notes (Signed)
Prolia injection given by CMA. Next dose due in 6 months. MJB,MD

## 2020-06-01 NOTE — Patient Instructions (Signed)
Patient received a PROLIA injection, SQ left arm. Return in 6 months for next injection.

## 2020-06-24 ENCOUNTER — Other Ambulatory Visit: Payer: Self-pay

## 2020-06-24 ENCOUNTER — Inpatient Hospital Stay: Payer: Medicare Other | Admitting: Hematology & Oncology

## 2020-06-24 ENCOUNTER — Telehealth: Payer: Self-pay

## 2020-06-24 ENCOUNTER — Inpatient Hospital Stay: Payer: Medicare Other | Attending: Hematology & Oncology

## 2020-06-24 ENCOUNTER — Encounter: Payer: Self-pay | Admitting: Hematology & Oncology

## 2020-06-24 VITALS — BP 108/63 | HR 97 | Temp 98.2°F | Resp 18 | Ht 64.0 in | Wt 88.0 lb

## 2020-06-24 DIAGNOSIS — E538 Deficiency of other specified B group vitamins: Secondary | ICD-10-CM | POA: Diagnosis not present

## 2020-06-24 DIAGNOSIS — D509 Iron deficiency anemia, unspecified: Secondary | ICD-10-CM | POA: Diagnosis not present

## 2020-06-24 DIAGNOSIS — Z79899 Other long term (current) drug therapy: Secondary | ICD-10-CM | POA: Insufficient documentation

## 2020-06-24 DIAGNOSIS — Z85028 Personal history of other malignant neoplasm of stomach: Secondary | ICD-10-CM | POA: Diagnosis not present

## 2020-06-24 DIAGNOSIS — G35 Multiple sclerosis: Secondary | ICD-10-CM | POA: Insufficient documentation

## 2020-06-24 DIAGNOSIS — D002 Carcinoma in situ of stomach: Secondary | ICD-10-CM

## 2020-06-24 DIAGNOSIS — K909 Intestinal malabsorption, unspecified: Secondary | ICD-10-CM | POA: Diagnosis not present

## 2020-06-24 DIAGNOSIS — D500 Iron deficiency anemia secondary to blood loss (chronic): Secondary | ICD-10-CM

## 2020-06-24 DIAGNOSIS — D510 Vitamin B12 deficiency anemia due to intrinsic factor deficiency: Secondary | ICD-10-CM | POA: Insufficient documentation

## 2020-06-24 LAB — CMP (CANCER CENTER ONLY)
ALT: 22 U/L (ref 0–44)
AST: 27 U/L (ref 15–41)
Albumin: 4.3 g/dL (ref 3.5–5.0)
Alkaline Phosphatase: 73 U/L (ref 38–126)
Anion gap: 7 (ref 5–15)
BUN: 18 mg/dL (ref 8–23)
CO2: 31 mmol/L (ref 22–32)
Calcium: 9.7 mg/dL (ref 8.9–10.3)
Chloride: 105 mmol/L (ref 98–111)
Creatinine: 0.99 mg/dL (ref 0.44–1.00)
GFR, Estimated: >60 mL/min (ref >60)
Glucose, Bld: 90 mg/dL (ref 70–99)
Potassium: 4.4 mmol/L (ref 3.5–5.1)
Sodium: 143 mmol/L (ref 135–145)
Total Bilirubin: 0.8 mg/dL (ref 0.3–1.2)
Total Protein: 7.4 g/dL (ref 6.5–8.1)

## 2020-06-24 LAB — CBC WITH DIFFERENTIAL (CANCER CENTER ONLY)
Abs Immature Granulocytes: 0.02 10*3/uL (ref 0.00–0.07)
Basophils Absolute: 0 10*3/uL (ref 0.0–0.1)
Basophils Relative: 1 %
Eosinophils Absolute: 0.1 10*3/uL (ref 0.0–0.5)
Eosinophils Relative: 2 %
HCT: 38.5 % (ref 36.0–46.0)
Hemoglobin: 12.3 g/dL (ref 12.0–15.0)
Immature Granulocytes: 1 %
Lymphocytes Relative: 40 %
Lymphs Abs: 1.2 10*3/uL (ref 0.7–4.0)
MCH: 30.8 pg (ref 26.0–34.0)
MCHC: 31.9 g/dL (ref 30.0–36.0)
MCV: 96.3 fL (ref 80.0–100.0)
Monocytes Absolute: 0.3 10*3/uL (ref 0.1–1.0)
Monocytes Relative: 11 %
Neutro Abs: 1.4 10*3/uL — ABNORMAL LOW (ref 1.7–7.7)
Neutrophils Relative %: 45 %
Platelet Count: 176 10*3/uL (ref 150–400)
RBC: 4 MIL/uL (ref 3.87–5.11)
RDW: 11.8 % (ref 11.5–15.5)
WBC Count: 3 10*3/uL — ABNORMAL LOW (ref 4.0–10.5)
nRBC: 0 % (ref 0.0–0.2)

## 2020-06-24 LAB — IRON AND TIBC
Iron: 96 ug/dL (ref 41–142)
Saturation Ratios: 29 % (ref 21–57)
TIBC: 330 ug/dL (ref 236–444)
UIBC: 234 ug/dL (ref 120–384)

## 2020-06-24 LAB — FERRITIN: Ferritin: 172 ng/mL (ref 11–307)

## 2020-06-24 LAB — VITAMIN B12: Vitamin B-12: 592 pg/mL (ref 180–914)

## 2020-06-24 NOTE — Telephone Encounter (Signed)
Appts made per 06/24/20 los and pt req to view on [employer]

## 2020-06-24 NOTE — Progress Notes (Signed)
Hematology and Oncology Follow Up Visit  Erika Murphy [id number] 1950-03-19 70 y.o. 06/24/2020   Principle Diagnosis:  Stage II (T3N0M0) adenocarcinoma of the stomach-remission Recurrent iron deficiency anemia  Pernicious anemia Multiple sclerosis Current Therapy:   IV iron as indicated Vitamin B-12 1 mg IM every month     Interim History:  [name] is back for follow-up.  She is doing quite well.  She feels good.  She can Erika Murphy for a big family reunion over the July 4 weekend.  She has had no problems with the multiple sclerosis.  Her last iron studies back in February showed a ferritin of 230 with an iron saturation of 34%.  She is had no problems with bowels or bladder.  There is been no diarrhea.  She is on Creon.  She was wondering if she could just take Creon as needed.  I told her I thought this would be okay.  She has had no issues with fever.  She has had no COVID problems.  She will actually was up in [place].  A niece got her diploma.  She got a doctorate up there.  Currently, her performance status is ECOG 2.    Medications:  Current Outpatient Medications:    albuterol (VENTOLIN HFA) 108 (90 Base) MCG/ACT inhaler, INHALE 2 PUFFS INTO THE LUNGS EVERY 6 HOURS AS NEEDED FOR SHORTNESS OF BREATH/WHEEZING, Disp: 8.5 each, Rfl: 11   ALPRAZolam (XANAX) 0.25 MG tablet, Take 1 tablet (0.25 mg total) by mouth at bedtime as needed for anxiety., Disp: 30 tablet, Rfl: 2   amLODipine (NORVASC) 2.5 MG tablet, TAKE 1 TABLET BY MOUTH EVERY DAY, Disp: 90 tablet, Rfl: 3   COVID-19 mRNA Vac-TriS, Pfizer, (PFIZER-BIONT COVID-19 VAC-TRIS) SUSP injection, Inject into the muscle., Disp: 0.3 mL, Rfl: 0   cyanocobalamin (,VITAMIN B-12,) 1000 MCG/ML injection, INJECT 1 ML (1,000 MCG TOTAL) INTO THE MUSCLE ONCE A MONTH AS DIRECTED, Disp: 3 mL, Rfl: 3   esomeprazole (NEXIUM) 40 MG capsule, TAKE 1 CAPSULE (40 MG TOTAL) BY MOUTH DAILY AT 12 NOON., Disp: 90 capsule, Rfl: 3   meclizine (ANTIVERT)  25 MG tablet, Take 1 tablet (25 mg total) by mouth 3 (three) times daily as needed for dizziness., Disp: 30 tablet, Rfl: 0   Multiple Vitamins-Minerals (MULTIVITAMIN GUMMIES WOMENS PO), Take 1 each by mouth daily. , Disp: , Rfl:    pregabalin (LYRICA) 50 MG capsule, TAKE 1 CAPSULE BY MOUTH THREE TIMES A DAY, Disp: 90 capsule, Rfl: 2   rosuvastatin (CRESTOR) 5 MG tablet, TAKE ONE TABLET BY MOUTH WEEKLY WITH SUPPER FOR HYPERCHOLESTEROLEMIA, Disp: 12 tablet, Rfl: 3   lipase/protease/amylase (CREON) 36000 UNITS CPEP capsule, Take 2 capsules (72,000 Units total) by mouth 3 (three) times daily with meals. May also take 1 capsule (36,000 Units total) as needed (with snacks). (Patient not taking: Reported on 06/24/2020), Disp: 240 capsule, Rfl: 11  Allergies: No Known Allergies  Past Medical History, Surgical history, Social history, and Family History were reviewed and updated.  Review of Systems: Review of Systems  Constitutional:  Positive for malaise/fatigue.  HENT:  Positive for tinnitus.   Eyes:  Positive for blurred vision.  Respiratory: Negative.    Cardiovascular: Negative.   Gastrointestinal:  Positive for diarrhea.  Genitourinary: Negative.   Musculoskeletal:  Positive for myalgias.  Skin: Negative.   Neurological:  Positive for tingling and weakness.  Endo/Heme/Allergies: Negative.   Psychiatric/Behavioral: Negative.     Physical Exam:  height is 5\' 4"  (1.626 m)  and weight is 88 lb (39.9 kg). Her oral temperature is 98.2 F (36.8 C). Her blood pressure is 108/63 and her pulse is 97. Her respiration is 18 and oxygen saturation is 100%.   Physical Exam Vitals reviewed.  HENT:     Head: Normocephalic and atraumatic.  Eyes:     Pupils: Pupils are equal, round, and reactive to light.  Cardiovascular:     Rate and Rhythm: Normal rate and regular rhythm.     Heart sounds: Normal heart sounds.  Pulmonary:     Effort: Pulmonary effort is normal.     Breath sounds: Normal breath  sounds.  Abdominal:     General: Bowel sounds are normal.     Palpations: Abdomen is soft.  Musculoskeletal:        General: No tenderness or deformity. Normal range of motion.     Cervical back: Normal range of motion.  Lymphadenopathy:     Cervical: No cervical adenopathy.  Skin:    General: Skin is warm and dry.     Findings: No erythema or rash.  Neurological:     Mental Status: She is alert and oriented to person, place, and time.  Psychiatric:        Behavior: Behavior normal.        Thought Content: Thought content normal.        Judgment: Judgment normal.     Lab Results  Component Value Date   WBC 3.0 (L) 06/24/2020   HGB 12.3 06/24/2020   HCT 38.5 06/24/2020   MCV 96.3 06/24/2020   PLT 176 06/24/2020     Chemistry      Component Value Date/Time   NA 143 06/24/2020 0928   NA 144 06/12/2016 1151   NA 142 06/11/2015 1156   K 4.4 06/24/2020 0928   K 4.7 06/12/2016 1151   K 4.3 06/11/2015 1156   CL 105 06/24/2020 0928   CL 107 06/12/2016 1151   CO2 31 06/24/2020 0928   CO2 32 06/12/2016 1151   CO2 29 06/11/2015 1156   BUN 18 06/24/2020 0928   BUN 11 06/12/2016 1151   BUN 13.5 06/11/2015 1156   CREATININE 0.99 06/24/2020 0928   CREATININE 0.79 02/09/2020 1119   CREATININE 1.2 (H) 06/11/2015 1156      Component Value Date/Time   CALCIUM 9.7 06/24/2020 0928   CALCIUM 9.6 06/12/2016 1151   CALCIUM 9.7 06/11/2015 1156   ALKPHOS 73 06/24/2020 0928   ALKPHOS 97 (H) 06/12/2016 1151   ALKPHOS 111 06/11/2015 1156   AST 27 06/24/2020 0928   AST 30 06/11/2015 1156   ALT 22 06/24/2020 0928   ALT 26 06/12/2016 1151   ALT 23 06/11/2015 1156   BILITOT 0.8 06/24/2020 0928   BILITOT 1.03 06/11/2015 1156      Impression and Plan: Erika Murphy is a 70 year old African [place] female. She had a history of stage II stomach cancer. She underwent resection. She had adjuvant therapy. This is now  21 years out from treatment. She is cured of the stomach cancer.   Weight  is stable.  Happy about this.  Her quality of life is doing quite well.  I think we cannot get her back in about 4 months.  We will get her through the holidays.  I am sure that she will have a wonderful family reunion over the July 4 holiday weekend.      [name], MD 6/23/202210:41 AM

## 2020-07-15 ENCOUNTER — Other Ambulatory Visit: Payer: Self-pay

## 2020-07-15 ENCOUNTER — Other Ambulatory Visit: Payer: Medicare Other | Admitting: Internal Medicine

## 2020-07-15 DIAGNOSIS — E7800 Pure hypercholesterolemia, unspecified: Secondary | ICD-10-CM | POA: Diagnosis not present

## 2020-07-16 ENCOUNTER — Ambulatory Visit (INDEPENDENT_AMBULATORY_CARE_PROVIDER_SITE_OTHER): Payer: Medicare Other | Admitting: Internal Medicine

## 2020-07-16 ENCOUNTER — Encounter: Payer: Self-pay | Admitting: Internal Medicine

## 2020-07-16 VITALS — BP 120/80 | HR 69 | Ht 64.0 in | Wt 88.0 lb

## 2020-07-16 DIAGNOSIS — E538 Deficiency of other specified B group vitamins: Secondary | ICD-10-CM | POA: Diagnosis not present

## 2020-07-16 DIAGNOSIS — I10 Essential (primary) hypertension: Secondary | ICD-10-CM | POA: Diagnosis not present

## 2020-07-16 DIAGNOSIS — R636 Underweight: Secondary | ICD-10-CM

## 2020-07-16 DIAGNOSIS — M810 Age-related osteoporosis without current pathological fracture: Secondary | ICD-10-CM | POA: Diagnosis not present

## 2020-07-16 DIAGNOSIS — Z85028 Personal history of other malignant neoplasm of stomach: Secondary | ICD-10-CM

## 2020-07-16 DIAGNOSIS — Z903 Acquired absence of stomach [part of]: Secondary | ICD-10-CM

## 2020-07-16 DIAGNOSIS — G35 Multiple sclerosis: Secondary | ICD-10-CM | POA: Diagnosis not present

## 2020-07-16 DIAGNOSIS — E7800 Pure hypercholesterolemia, unspecified: Secondary | ICD-10-CM | POA: Diagnosis not present

## 2020-07-16 LAB — HEPATIC FUNCTION PANEL
AG Ratio: 1.4 (calc) (ref 1.0–2.5)
ALT: 20 U/L (ref 6–29)
AST: 27 U/L (ref 10–35)
Albumin: 3.9 g/dL (ref 3.6–5.1)
Alkaline phosphatase (APISO): 63 U/L (ref 37–153)
Bilirubin, Direct: 0.2 mg/dL (ref 0.0–0.2)
Globulin: 2.7 g/dL (calc) (ref 1.9–3.7)
Indirect Bilirubin: 0.5 mg/dL (calc) (ref 0.2–1.2)
Total Bilirubin: 0.7 mg/dL (ref 0.2–1.2)
Total Protein: 6.6 g/dL (ref 6.1–8.1)

## 2020-07-16 LAB — LIPID PANEL
Cholesterol: 180 mg/dL (ref <200)
HDL: 53 mg/dL (ref >=50)
LDL Cholesterol (Calc): 110 mg/dL (calc) — ABNORMAL HIGH
Non-HDL Cholesterol (Calc): 127 mg/dL (calc) (ref <130)
Total CHOL/HDL Ratio: 3.4 (calc) (ref <5.0)
Triglycerides: 82 mg/dL (ref <150)

## 2020-07-16 NOTE — Progress Notes (Signed)
   Subjective:    Patient ID: Erika Murphy, female    DOB: 1950/08/16, 70 y.o.   MRN: [id number]  HPI 70 year old Female for 6 month recheck. Weight is stable. She weighed 86 pounds in  February but gained to 88 pounds in May and now weighs 88 pounds. Trying to eat well and get enough calories. Dr. Marin Olp thinks she is stable also.  She gets Prolia injections for osteoporosis every 6 months.  T score -2.6 in October 2021.  Due for another bone density in 2023.  Spent some time in [place] with family recently.  Her lipids have improved.  LDL has decreased from 133 in January to 110.  Total cholesterol has decreased from 201 to 180.  Triglycerides are normal.  She is on Crestor 5 mg weekly with supper 08 January 2020.  Review of Systems     Objective:   Physical Exam BP 108/63 pulse 97, T 98.2 respiratory 18, weight 88 pounds She ambulates with a cane.  Neck is supple without JVD thyromegaly or carotid bruits.  Chest is clear to auscultation.  Cardiac exam regular rate and rhythm.  No thyromegaly.  No edema of the lower extremities.  Affect thought and judgment appear to be normal.      Assessment & Plan:   Osteoporosis-she is on Prolia injections every 6 months and next bone density study due 2023  History of weight loss-she remains underweight at 88 pounds but this has been stable over the past several months.  She says she is eating fairly well.  Continue to monitor.  History of gastric cancer-maintained on PPI  History of multiple sclerosis-ambulates with a cane.  Takes Lyrica.  Pure hypercholesterolemia-stable with Crestor 5 mg weekly  History of B12 deficiency with gastrectomy  and gives self  Health maintenance.  Had COVID booster in May monthly B12 injections  Essential hypertension treated with Norvasc 2.5 mg daily  Plan: She will return in 6 months for Medicare wellness and health maintenance exam.  Recommend COVID booster in the Fall.  Continue to watch her caloric  intake.

## 2020-07-16 NOTE — Patient Instructions (Addendum)
Cholesterol has improved a low-dose Crestor.  Continue to monitor your weight and make sure you are taking in enough calories.  We will continue with Prolia injections every 6 months.  No change in medications.  Blood pressure is stable.  Have COVID booster in the Fall even though you had one in the Spring.  Return in 6 months for Medicare wellness and health maintenance exam with fasting labs.

## 2020-07-21 ENCOUNTER — Encounter: Payer: Self-pay | Admitting: Internal Medicine

## 2020-07-21 ENCOUNTER — Telehealth: Payer: Self-pay | Admitting: Internal Medicine

## 2020-07-21 ENCOUNTER — Other Ambulatory Visit: Payer: Self-pay | Admitting: Internal Medicine

## 2020-07-21 MED ORDER — PREGABALIN 50 MG PO CAPS: ORAL_CAPSULE | ORAL | 0 refills | Status: DC

## 2020-07-21 NOTE — Telephone Encounter (Signed)
Lyrica 50 mg #90 refilled with Sig: one capsule 3 times a day

## 2020-09-01 ENCOUNTER — Other Ambulatory Visit: Payer: Self-pay | Admitting: Internal Medicine

## 2020-09-02 ENCOUNTER — Telehealth: Payer: Self-pay | Admitting: Internal Medicine

## 2020-09-02 NOTE — Telephone Encounter (Signed)
error 

## 2020-09-07 ENCOUNTER — Ambulatory Visit: Payer: Medicare Other | Admitting: Internal Medicine

## 2020-09-10 ENCOUNTER — Ambulatory Visit (INDEPENDENT_AMBULATORY_CARE_PROVIDER_SITE_OTHER): Payer: Medicare Other | Admitting: Internal Medicine

## 2020-09-10 ENCOUNTER — Other Ambulatory Visit: Payer: Self-pay

## 2020-09-10 ENCOUNTER — Ambulatory Visit
Admission: RE | Admit: 2020-09-10 | Discharge: 2020-09-10 | Disposition: A | Discharge status: Home / Self Care | Payer: Medicare Other | Source: Ambulatory Visit | Attending: Internal Medicine | Admitting: Internal Medicine

## 2020-09-10 ENCOUNTER — Encounter: Payer: Self-pay | Admitting: Internal Medicine

## 2020-09-10 VITALS — BP 108/68 | Ht 64.0 in | Wt 84.0 lb

## 2020-09-10 DIAGNOSIS — Z23 Encounter for immunization: Secondary | ICD-10-CM

## 2020-09-10 DIAGNOSIS — G35 Multiple sclerosis: Secondary | ICD-10-CM

## 2020-09-10 DIAGNOSIS — N644 Mastodynia: Secondary | ICD-10-CM | POA: Diagnosis not present

## 2020-09-10 DIAGNOSIS — M79671 Pain in right foot: Secondary | ICD-10-CM

## 2020-09-10 DIAGNOSIS — W19XXXA Unspecified fall, initial encounter: Secondary | ICD-10-CM | POA: Diagnosis not present

## 2020-09-10 NOTE — Patient Instructions (Addendum)
Flu vaccine given. Mammogram ordered.  Have Xray of right foot. Take Lyrica for pain.  Addendum: May use Ace wrap and wrap right foot/ankle until pain is improved.  Your x-ray is negative for fracture.  May apply ice to right foot and ankle 2-3 times daily.  Call if not improving.

## 2020-09-10 NOTE — Progress Notes (Signed)
   Subjective:    Patient ID: Erika Murphy, female    DOB: Dec 28, 1950, 70 y.o.   MRN: [id number]  HPI Patient fell at daughter's house inside last week when she arose from a chair.  Patient has a history of MS and has some gait disturbance.  Currently has pain right ankle. Also has pain left breast. No recent mammogram. Denies striking breast with fall.  These are 2 separate issues.  She has hyperlipidemia treated with Crestor in addition to multiple sclerosis.  She takes Lyrica for musculoskeletal pain associated with MS.  She takes Nexium 40 mg daily as she has a history of gastric cancer and is followed by Dr. Marin Olp.  She takes amlodipine 2.5 mg daily for mild hypertension.  She takes B12 injections monthly with history of gastrectomy.  Review of Systems see above-reports that ankle is not red hot or swollen     Objective:   Physical Exam Weight is 84 pounds.  Blood pressure 108/68.  Has lost 4 pounds since July.  This was discussed.  She is not sure why she has lost this weight.  This will need to be monitored closely.  Examination of her right foot and ankle is essentially unremarkable.  She has no tenderness along the ligaments of her ankle either medially or laterally and she can flex and retroflex the ankle joint.  Has some tenderness medially.  I do not palpate any masses or nodules in her left breast.     Assessment & Plan:   Left breast pain-to have mammogram  Right foot and ankle pain.  X-ray is unremarkable.  Seems to have been triggered by fall at her daughter's house.  Plan: Recommend that she ice the ankle for 20 minutes several times daily and wrap it with an Ace wrap.  X-ray shows bunion deformity first MTP joint.  No fracture identified.  Flu vaccine given.  Refilled Lyrica for pain

## 2020-09-14 ENCOUNTER — Other Ambulatory Visit: Payer: Self-pay

## 2020-09-14 ENCOUNTER — Other Ambulatory Visit: Payer: Self-pay | Admitting: Internal Medicine

## 2020-09-14 ENCOUNTER — Telehealth: Payer: Self-pay

## 2020-09-14 DIAGNOSIS — N644 Mastodynia: Secondary | ICD-10-CM

## 2020-09-14 NOTE — Telephone Encounter (Signed)
-----   Message from Elby Showers, MD sent at 09/13/2020  4:36 PM EDT ----- Call patient- her foot Xray is normal- no stress fracture ----- Message ----- From: Interface, Rad Results In Sent: 09/13/2020   3:58 PM EDT To: Elby Showers, MD

## 2020-09-14 NOTE — Telephone Encounter (Signed)
Patient informed of results.  

## 2020-10-17 ENCOUNTER — Other Ambulatory Visit: Payer: Self-pay | Admitting: Internal Medicine

## 2020-10-20 ENCOUNTER — Ambulatory Visit
Admission: RE | Admit: 2020-10-20 | Discharge: 2020-10-20 | Disposition: A | Discharge status: Home / Self Care | Payer: Medicare Other | Source: Ambulatory Visit | Attending: Internal Medicine | Admitting: Internal Medicine

## 2020-10-20 ENCOUNTER — Other Ambulatory Visit: Payer: Self-pay

## 2020-10-20 DIAGNOSIS — N644 Mastodynia: Secondary | ICD-10-CM

## 2020-10-20 DIAGNOSIS — R922 Inconclusive mammogram: Secondary | ICD-10-CM | POA: Diagnosis not present

## 2020-10-22 ENCOUNTER — Inpatient Hospital Stay (HOSPITAL_BASED_OUTPATIENT_CLINIC_OR_DEPARTMENT_OTHER): Payer: Medicare Other | Admitting: Hematology & Oncology

## 2020-10-22 ENCOUNTER — Encounter: Payer: Self-pay | Admitting: Hematology & Oncology

## 2020-10-22 ENCOUNTER — Telehealth: Payer: Self-pay | Admitting: *Deleted

## 2020-10-22 ENCOUNTER — Inpatient Hospital Stay: Payer: Medicare Other | Attending: Hematology & Oncology

## 2020-10-22 ENCOUNTER — Other Ambulatory Visit: Payer: Self-pay

## 2020-10-22 VITALS — BP 138/67 | HR 65 | Temp 98.3°F | Resp 16 | Wt 92.0 lb

## 2020-10-22 DIAGNOSIS — D500 Iron deficiency anemia secondary to blood loss (chronic): Secondary | ICD-10-CM

## 2020-10-22 DIAGNOSIS — Z79899 Other long term (current) drug therapy: Secondary | ICD-10-CM | POA: Insufficient documentation

## 2020-10-22 DIAGNOSIS — Z85028 Personal history of other malignant neoplasm of stomach: Secondary | ICD-10-CM | POA: Diagnosis not present

## 2020-10-22 DIAGNOSIS — E538 Deficiency of other specified B group vitamins: Secondary | ICD-10-CM | POA: Diagnosis not present

## 2020-10-22 DIAGNOSIS — G35 Multiple sclerosis: Secondary | ICD-10-CM | POA: Diagnosis not present

## 2020-10-22 DIAGNOSIS — D510 Vitamin B12 deficiency anemia due to intrinsic factor deficiency: Secondary | ICD-10-CM | POA: Insufficient documentation

## 2020-10-22 DIAGNOSIS — K909 Intestinal malabsorption, unspecified: Secondary | ICD-10-CM

## 2020-10-22 DIAGNOSIS — D509 Iron deficiency anemia, unspecified: Secondary | ICD-10-CM | POA: Diagnosis not present

## 2020-10-22 LAB — CMP (CANCER CENTER ONLY)
ALT: 16 U/L (ref 0–44)
AST: 24 U/L (ref 15–41)
Albumin: 3.9 g/dL (ref 3.5–5.0)
Alkaline Phosphatase: 58 U/L (ref 38–126)
Anion gap: 5 (ref 5–15)
BUN: 13 mg/dL (ref 8–23)
CO2: 30 mmol/L (ref 22–32)
Calcium: 9.5 mg/dL (ref 8.9–10.3)
Chloride: 105 mmol/L (ref 98–111)
Creatinine: 0.86 mg/dL (ref 0.44–1.00)
GFR, Estimated: >60 mL/min (ref >60)
Glucose, Bld: 133 mg/dL — ABNORMAL HIGH (ref 70–99)
Potassium: 4.3 mmol/L (ref 3.5–5.1)
Sodium: 140 mmol/L (ref 135–145)
Total Bilirubin: 0.7 mg/dL (ref 0.3–1.2)
Total Protein: 7.1 g/dL (ref 6.5–8.1)

## 2020-10-22 LAB — IRON AND TIBC
Iron: 99 ug/dL (ref 28–170)
Saturation Ratios: 28 % (ref 10.4–31.8)
TIBC: 359 ug/dL (ref 250–450)
UIBC: 260 ug/dL

## 2020-10-22 LAB — RETICULOCYTES
Immature Retic Fract: 7.1 % (ref 2.3–15.9)
RBC.: 3.86 MIL/uL — ABNORMAL LOW (ref 3.87–5.11)
Retic Count, Absolute: 45.9 10*3/uL (ref 19.0–186.0)
Retic Ct Pct: 1.2 % (ref 0.4–3.1)

## 2020-10-22 LAB — CBC WITH DIFFERENTIAL (CANCER CENTER ONLY)
Abs Immature Granulocytes: 0.01 10*3/uL (ref 0.00–0.07)
Basophils Absolute: 0 10*3/uL (ref 0.0–0.1)
Basophils Relative: 1 %
Eosinophils Absolute: 0.2 10*3/uL (ref 0.0–0.5)
Eosinophils Relative: 6 %
HCT: 35.8 % — ABNORMAL LOW (ref 36.0–46.0)
Hemoglobin: 11.5 g/dL — ABNORMAL LOW (ref 12.0–15.0)
Immature Granulocytes: 0 %
Lymphocytes Relative: 43 %
Lymphs Abs: 1.1 10*3/uL (ref 0.7–4.0)
MCH: 30 pg (ref 26.0–34.0)
MCHC: 32.1 g/dL (ref 30.0–36.0)
MCV: 93.5 fL (ref 80.0–100.0)
Monocytes Absolute: 0.4 10*3/uL (ref 0.1–1.0)
Monocytes Relative: 15 %
Neutro Abs: 0.9 10*3/uL — ABNORMAL LOW (ref 1.7–7.7)
Neutrophils Relative %: 35 %
Platelet Count: 145 10*3/uL — ABNORMAL LOW (ref 150–400)
RBC: 3.83 MIL/uL — ABNORMAL LOW (ref 3.87–5.11)
RDW: 11.9 % (ref 11.5–15.5)
WBC Count: 2.5 10*3/uL — ABNORMAL LOW (ref 4.0–10.5)
nRBC: 0 % (ref 0.0–0.2)

## 2020-10-22 LAB — VITAMIN B12: Vitamin B-12: 291 pg/mL (ref 180–914)

## 2020-10-22 LAB — FERRITIN: Ferritin: 97 ng/mL (ref 11–307)

## 2020-10-22 LAB — SAVE SMEAR(SSMR), FOR PROVIDER SLIDE REVIEW

## 2020-10-22 LAB — LACTATE DEHYDROGENASE: LDH: 159 U/L (ref 98–192)

## 2020-10-22 NOTE — Progress Notes (Signed)
Hematology and Oncology Follow Up Visit  Erika Murphy [id number] 1950/05/08 70 y.o. 10/22/2020   Principle Diagnosis:  Stage II (T3N0M0) adenocarcinoma of the stomach-remission Recurrent iron deficiency anemia  Pernicious anemia Multiple sclerosis Current Therapy:   IV iron as indicated -- Venofer given on 04/2019 Vitamin B-12 1 mg IM every month --done at home     Interim History:  [name] is back for follow-up.  We saw her back in June.  She had a very nice summer.  She went on a cruise to the [place].  There was a family reunion held down here.  As always, she is doing a lot with her family.  She does feel a little tired.  Her blood counts are down a little bit today.  Her last vitamin B12 level in June was normal at 592.  Her last iron studies back in June showed a ferritin of 172 with an iron saturation of 29%.    Is been no change in bowel or bladder habits.  She has had no issues with nausea or vomiting.  Her weight is actually up.  She has had no problems with flareups of the multiple sclerosis.  Currently, I would say performance status is ECOG 1.    Medications:  Current Outpatient Medications:    albuterol (VENTOLIN HFA) 108 (90 Base) MCG/ACT inhaler, INHALE 2 PUFFS INTO THE LUNGS EVERY 6 HOURS AS NEEDED FOR SHORTNESS OF BREATH/WHEEZING, Disp: 8.5 each, Rfl: 11   ALPRAZolam (XANAX) 0.25 MG tablet, Take 1 tablet (0.25 mg total) by mouth at bedtime as needed for anxiety., Disp: 30 tablet, Rfl: 2   amLODipine (NORVASC) 2.5 MG tablet, TAKE 1 TABLET BY MOUTH EVERY DAY, Disp: 90 tablet, Rfl: 3   COVID-19 mRNA Vac-TriS, Pfizer, (PFIZER-BIONT COVID-19 VAC-TRIS) SUSP injection, Inject into the muscle., Disp: 0.3 mL, Rfl: 0   cyanocobalamin (,VITAMIN B-12,) 1000 MCG/ML injection, INJECT 1 ML (1,000 MCG TOTAL) INTO THE MUSCLE ONCE A MONTH AS DIRECTED, Disp: 3 mL, Rfl: 3   esomeprazole (NEXIUM) 40 MG capsule, TAKE 1 CAPSULE (40 MG TOTAL) BY MOUTH DAILY AT 12 NOON., Disp: 90  capsule, Rfl: 3   lipase/protease/amylase (CREON) 36000 UNITS CPEP capsule, Take 2 capsules (72,000 Units total) by mouth 3 (three) times daily with meals. May also take 1 capsule (36,000 Units total) as needed (with snacks)., Disp: 240 capsule, Rfl: 11   meclizine (ANTIVERT) 25 MG tablet, Take 1 tablet (25 mg total) by mouth 3 (three) times daily as needed for dizziness., Disp: 30 tablet, Rfl: 0   Multiple Vitamins-Minerals (MULTIVITAMIN GUMMIES WOMENS PO), Take 1 each by mouth daily. , Disp: , Rfl:    pregabalin (LYRICA) 50 MG capsule, One capsule 3 times a day by mouth, Disp: 90 capsule, Rfl: 0   rosuvastatin (CRESTOR) 5 MG tablet, TAKE ONE TABLET BY MOUTH WEEKLY WITH SUPPER FOR HYPERCHOLESTEROLEMIA, Disp: 12 tablet, Rfl: 3  Allergies: No Known Allergies  Past Medical History, Surgical history, Social history, and Family History were reviewed and updated.  Review of Systems: Review of Systems  Constitutional:  Positive for malaise/fatigue.  HENT:  Positive for tinnitus.   Eyes:  Positive for blurred vision.  Respiratory: Negative.    Cardiovascular: Negative.   Gastrointestinal:  Positive for diarrhea.  Genitourinary: Negative.   Musculoskeletal:  Positive for myalgias.  Skin: Negative.   Neurological:  Positive for tingling and weakness.  Endo/Heme/Allergies: Negative.   Psychiatric/Behavioral: Negative.     Physical Exam:  weight is 92 lb (  41.7 kg). Her oral temperature is 98.3 F (36.8 C). Her blood pressure is 138/67 and her pulse is 65. Her respiration is 16 and oxygen saturation is 100%.   Physical Exam Vitals reviewed.  HENT:     Head: Normocephalic and atraumatic.  Eyes:     Pupils: Pupils are equal, round, and reactive to light.  Cardiovascular:     Rate and Rhythm: Normal rate and regular rhythm.     Heart sounds: Normal heart sounds.  Pulmonary:     Effort: Pulmonary effort is normal.     Breath sounds: Normal breath sounds.  Abdominal:     General: Bowel  sounds are normal.     Palpations: Abdomen is soft.  Musculoskeletal:        General: No tenderness or deformity. Normal range of motion.     Cervical back: Normal range of motion.  Lymphadenopathy:     Cervical: No cervical adenopathy.  Skin:    General: Skin is warm and dry.     Findings: No erythema or rash.  Neurological:     Mental Status: She is alert and oriented to person, place, and time.  Psychiatric:        Behavior: Behavior normal.        Thought Content: Thought content normal.        Judgment: Judgment normal.     Lab Results  Component Value Date   WBC 2.5 (L) 10/22/2020   HGB 11.5 (L) 10/22/2020   HCT 35.8 (L) 10/22/2020   MCV 93.5 10/22/2020   PLT 145 (L) 10/22/2020     Chemistry      Component Value Date/Time   NA 143 06/24/2020 0928   NA 144 06/12/2016 1151   NA 142 06/11/2015 1156   K 4.4 06/24/2020 0928   K 4.7 06/12/2016 1151   K 4.3 06/11/2015 1156   CL 105 06/24/2020 0928   CL 107 06/12/2016 1151   CO2 31 06/24/2020 0928   CO2 32 06/12/2016 1151   CO2 29 06/11/2015 1156   BUN 18 06/24/2020 0928   BUN 11 06/12/2016 1151   BUN 13.5 06/11/2015 1156   CREATININE 0.99 06/24/2020 0928   CREATININE 0.79 02/09/2020 1119   CREATININE 1.2 (H) 06/11/2015 1156      Component Value Date/Time   CALCIUM 9.7 06/24/2020 0928   CALCIUM 9.6 06/12/2016 1151   CALCIUM 9.7 06/11/2015 1156   ALKPHOS 73 06/24/2020 0928   ALKPHOS 97 (H) 06/12/2016 1151   ALKPHOS 111 06/11/2015 1156   AST 27 07/15/2020 0934   AST 27 06/24/2020 0928   AST 30 06/11/2015 1156   ALT 20 07/15/2020 0934   ALT 22 06/24/2020 0928   ALT 26 06/12/2016 1151   ALT 23 06/11/2015 1156   BILITOT 0.7 07/15/2020 0934   BILITOT 0.8 06/24/2020 0928   BILITOT 1.03 06/11/2015 1156      Impression and Plan: Erika Murphy is a 70 year old African [place] female. She had a history of stage II stomach cancer. She underwent resection. She had adjuvant therapy. This is now  21 years out from  treatment. She is cured of the stomach cancer.   I am happy that her weight is up.  Not sure as to why her blood counts are down a little bit.  We will have to see what her vitamin B-12 level is.  We will have to see what her iron studies show.  Her metabolic studies all look pretty good.  We will  have to follow along liver more closely.  I would like to have her back in about 3 months.   [name], MD 10/21/202210:02 AM

## 2020-10-22 NOTE — Telephone Encounter (Signed)
Per 10/22/20 los gave upcoming appointments - confirmed

## 2020-10-28 ENCOUNTER — Other Ambulatory Visit: Payer: Self-pay

## 2020-11-02 ENCOUNTER — Telehealth: Payer: Self-pay | Admitting: Internal Medicine

## 2020-11-02 MED ORDER — ESOMEPRAZOLE MAGNESIUM 40 MG PO CPDR: 40.0000 mg | DELAYED_RELEASE_CAPSULE | Freq: Every day | ORAL | 3 refills | Status: DC

## 2020-11-02 NOTE — Telephone Encounter (Signed)
Erika Murphy [phone]  [place] called to have below prescription sent to new pharmacy.  esomeprazole (NEXIUM) 40 MG capsule  [facility] ([facility] - [place], [place] Phone:  [phone]  Fax:  [phone]

## 2020-11-02 NOTE — Telephone Encounter (Signed)
Patient notified VIA phone and rx sent to mail order. Dm/cma

## 2020-11-18 ENCOUNTER — Telehealth: Payer: Self-pay

## 2020-11-18 NOTE — Telephone Encounter (Signed)
Report states that patient is not filling her rx for crestor. She got # 84 on 07/08/2020. She is only taking one a week at this time. That will last her over 1 year. She is not in need of refill at this time.

## 2020-11-19 ENCOUNTER — Other Ambulatory Visit: Payer: Self-pay

## 2020-11-19 ENCOUNTER — Ambulatory Visit (INDEPENDENT_AMBULATORY_CARE_PROVIDER_SITE_OTHER): Payer: Medicare Other

## 2020-11-19 DIAGNOSIS — M8580 Other specified disorders of bone density and structure, unspecified site: Secondary | ICD-10-CM | POA: Diagnosis not present

## 2020-11-19 MED ORDER — DENOSUMAB 60 MG/ML ~~LOC~~ SOSY
60.0000 mg | PREFILLED_SYRINGE | Freq: Once | SUBCUTANEOUS | Status: AC
  Administered 2020-11-19: 60 mg via SUBCUTANEOUS

## 2020-11-19 NOTE — Addendum Note (Signed)
Addended by: Angus Seller on: 11/19/2020 11:58 AM   Modules accepted: Orders

## 2020-11-19 NOTE — Progress Notes (Addendum)
Patient received 60mg  prolia [place] in left arm. No adverse reactions noted.    Next dose due in 6 months. Had bone density study October 2021. T score then in forearm -2.6 and - 2.5 in left forearm. Next study due Oct 2023. MJB, MD

## 2020-12-16 DIAGNOSIS — M75100 Unspecified rotator cuff tear or rupture of unspecified shoulder, not specified as traumatic: Secondary | ICD-10-CM | POA: Insufficient documentation

## 2020-12-16 DIAGNOSIS — M25511 Pain in right shoulder: Secondary | ICD-10-CM | POA: Diagnosis not present

## 2020-12-16 DIAGNOSIS — M542 Cervicalgia: Secondary | ICD-10-CM | POA: Diagnosis not present

## 2020-12-16 DIAGNOSIS — M19111 Post-traumatic osteoarthritis, right shoulder: Secondary | ICD-10-CM | POA: Diagnosis not present

## 2020-12-22 ENCOUNTER — Telehealth (INDEPENDENT_AMBULATORY_CARE_PROVIDER_SITE_OTHER): Payer: Medicare Other | Admitting: Internal Medicine

## 2020-12-22 ENCOUNTER — Telehealth: Payer: Self-pay | Admitting: Internal Medicine

## 2020-12-22 VITALS — Temp 98.3°F

## 2020-12-22 DIAGNOSIS — J069 Acute upper respiratory infection, unspecified: Secondary | ICD-10-CM

## 2020-12-22 DIAGNOSIS — J040 Acute laryngitis: Secondary | ICD-10-CM | POA: Diagnosis not present

## 2020-12-22 DIAGNOSIS — Z862 Personal history of diseases of the blood and blood-forming organs and certain disorders involving the immune mechanism: Secondary | ICD-10-CM

## 2020-12-22 DIAGNOSIS — G35 Multiple sclerosis: Secondary | ICD-10-CM | POA: Diagnosis not present

## 2020-12-22 MED ORDER — AZITHROMYCIN 250 MG PO TABS: ORAL_TABLET | ORAL | 0 refills | Status: AC

## 2020-12-22 MED ORDER — HYDROCODONE BIT-HOMATROP MBR 5-1.5 MG/5ML PO SOLN: 5.0000 mL | Freq: Three times a day (TID) | ORAL | 0 refills | Status: DC | PRN

## 2020-12-22 NOTE — Telephone Encounter (Signed)
scheduled

## 2020-12-22 NOTE — Telephone Encounter (Signed)
Erika Murphy [phone]  [name] called to say she started getting sick on Saturday with her eyes burning and now she has sore throat from drainage and cough, laryngitis and is having shortness of breath. It was really hard to understand her the laryngitis is so bad.

## 2020-12-22 NOTE — Progress Notes (Addendum)
° °  Subjective:    Patient ID: Erika Murphy, female    DOB: [birth year], 70 y.o.   MRN: [id number]  HPI 70 year old Black Female with history of Multiple sclerosis seen today via interactive audio and video telecommunications due to the Coronavirus pandemic.  She does not drive and this is a more convenient format for her today.  She is acutely ill with laryngitis.  Despite multiple attempts at video connection, this could not be accomplished so we continued with audio alone.  She is identified using 2 identifiers as Erika Murphy. Erika Murphy, a patient in this practice.  She is agreeable to visit in this format today.  She is at her home and I am at my office.  Patient is very hoarse and difficult to understand today.  Patient had onset Saturday, December 17 of sore throat, postnasal drip with thick sputum production and cough.  Also subsequently developed laryngitis.  Complained of some shortness of breath.  Also complaining of eyes burning.  Does not have eye discharge as I understand it.  In speaking with her she does not appear to be short of breath today but is very hoarse.  Apparently does not have pulse oximeter available.  She is unable to do COVID testing at home.  She has no transportation to the office today.  She says she is afebrile.  Medical issues include history of B12 deficiency status postgastrectomy for carcinoma of the stomach treated with subtotal gastrectomy and partial omentectomy in 2001.  History of H. pylori infection in 2006.  History of osteopenia.  She gives her self monthly B12 injections.  Hypercholesterolemia treated with Crestor 5 mg weekly.  Essential hypertension treated with low-dose Norvasc.  Has not seen neurologist in a long time for multiple sclerosis.  Multiple sclerosis was diagnosed in 1998.  Occasionally has a fall.  History of spastic gait disorder with lower distal weakness and proximal weakness.  She took Betaseron for a while but did not like the way it made her feel and  discontinued it.  History of left shoulder arthroplasty by Dr. Veverly Fells in 2011.  She takes Lyrica and ambulates with a cane.  Has had some memory issues in the past.  An MRI of the brain without contrast in January 2018 was stable compared to 1 done in 2015.  In 2016 she had right total hip arthroplasty for right femoral fracture.  She had upper endoscopy in March 2022 for evaluation of weight loss which was unremarkable except for Billroth II gastrojejunostomy    Review of Systems denies shaking chills, nausea or vomiting     Objective:   Physical Exam Reports her temp to be 98.3 degrees hoarse but able to give a clear concise history.       Assessment & Plan:  Acute laryngitis  Acute upper respiratory infection  History of multiple sclerosis  History of iron deficiency anemia treated by Dr. Marin Olp  Essential hypertension  Plan: Patient was prescribed Hycodan 1 teaspoon every 8 hours as needed for cough.  Take Zithromax Z-PAK 2 tabs day 1 followed by 1 tab days 2 through 5.  Rest and drink plenty of fluids.  Call if not better in 48 to 72 hours or sooner if worse.  Time spent with this visit is 20 minutes

## 2020-12-23 ENCOUNTER — Telehealth: Payer: Medicare Other | Admitting: Internal Medicine

## 2020-12-23 ENCOUNTER — Other Ambulatory Visit: Payer: Self-pay | Admitting: Internal Medicine

## 2021-01-14 DIAGNOSIS — H35033 Hypertensive retinopathy, bilateral: Secondary | ICD-10-CM | POA: Diagnosis not present

## 2021-01-14 DIAGNOSIS — H25813 Combined forms of age-related cataract, bilateral: Secondary | ICD-10-CM | POA: Diagnosis not present

## 2021-01-14 DIAGNOSIS — H401131 Primary open-angle glaucoma, bilateral, mild stage: Secondary | ICD-10-CM | POA: Diagnosis not present

## 2021-01-14 DIAGNOSIS — G35 Multiple sclerosis: Secondary | ICD-10-CM | POA: Diagnosis not present

## 2021-01-17 ENCOUNTER — Other Ambulatory Visit: Payer: Self-pay | Admitting: Internal Medicine

## 2021-01-21 ENCOUNTER — Other Ambulatory Visit: Payer: Medicare Other | Admitting: Internal Medicine

## 2021-01-21 ENCOUNTER — Other Ambulatory Visit: Payer: Self-pay

## 2021-01-21 DIAGNOSIS — Z13220 Encounter for screening for lipoid disorders: Secondary | ICD-10-CM | POA: Diagnosis not present

## 2021-01-21 DIAGNOSIS — I10 Essential (primary) hypertension: Secondary | ICD-10-CM | POA: Diagnosis not present

## 2021-01-21 DIAGNOSIS — Z1329 Encounter for screening for other suspected endocrine disorder: Secondary | ICD-10-CM

## 2021-01-22 LAB — CBC WITH DIFFERENTIAL/PLATELET
Absolute Monocytes: 262 cells/uL (ref 200–950)
Basophils Absolute: 30 cells/uL (ref 0–200)
Basophils Relative: 1.1 %
Eosinophils Absolute: 111 cells/uL (ref 15–500)
Eosinophils Relative: 4.1 %
HCT: 38.3 % (ref 35.0–45.0)
Hemoglobin: 12.4 g/dL (ref 11.7–15.5)
Lymphs Abs: 1218 cells/uL (ref 850–3900)
MCH: 30.2 pg (ref 27.0–33.0)
MCHC: 32.4 g/dL (ref 32.0–36.0)
MCV: 93.2 fL (ref 80.0–100.0)
MPV: 11.1 fL (ref 7.5–12.5)
Monocytes Relative: 9.7 %
Neutro Abs: 1080 cells/uL — ABNORMAL LOW (ref 1500–7800)
Neutrophils Relative %: 40 %
Platelets: 174 10*3/uL (ref 140–400)
RBC: 4.11 10*6/uL (ref 3.80–5.10)
RDW: 11.6 % (ref 11.0–15.0)
Total Lymphocyte: 45.1 %
WBC: 2.7 10*3/uL — ABNORMAL LOW (ref 3.8–10.8)

## 2021-01-22 LAB — TSH: TSH: 0.7 mIU/L (ref 0.40–4.50)

## 2021-01-22 LAB — COMPLETE METABOLIC PANEL WITH GFR
AG Ratio: 1.3 (calc) (ref 1.0–2.5)
ALT: 16 U/L (ref 6–29)
AST: 25 U/L (ref 10–35)
Albumin: 4.1 g/dL (ref 3.6–5.1)
Alkaline phosphatase (APISO): 54 U/L (ref 37–153)
BUN/Creatinine Ratio: 16 (calc) (ref 6–22)
BUN: 17 mg/dL (ref 7–25)
CO2: 30 mmol/L (ref 20–32)
Calcium: 9.5 mg/dL (ref 8.6–10.4)
Chloride: 107 mmol/L (ref 98–110)
Creat: 1.07 mg/dL — ABNORMAL HIGH (ref 0.60–1.00)
Globulin: 3.1 g/dL (calc) (ref 1.9–3.7)
Glucose, Bld: 95 mg/dL (ref 65–99)
Potassium: 4.8 mmol/L (ref 3.5–5.3)
Sodium: 142 mmol/L (ref 135–146)
Total Bilirubin: 0.8 mg/dL (ref 0.2–1.2)
Total Protein: 7.2 g/dL (ref 6.1–8.1)
eGFR: 55 mL/min/{1.73_m2} — ABNORMAL LOW (ref >=60)

## 2021-01-22 LAB — LIPID PANEL
Cholesterol: 187 mg/dL (ref <200)
HDL: 58 mg/dL (ref >=50)
LDL Cholesterol (Calc): 110 mg/dL (calc) — ABNORMAL HIGH
Non-HDL Cholesterol (Calc): 129 mg/dL (calc) (ref <130)
Total CHOL/HDL Ratio: 3.2 (calc) (ref <5.0)
Triglycerides: 101 mg/dL (ref <150)

## 2021-01-24 ENCOUNTER — Encounter: Payer: Self-pay | Admitting: Internal Medicine

## 2021-01-24 NOTE — Patient Instructions (Addendum)
Take Zithromax Z-PAK 2 tabs day 1 followed by 1 tab days 2 through 5.  May take Hycodan 1 teaspoon every 8 hours as needed for cough.  Rest and drink plenty of fluids.  Call if not better in 48 to 72 hours or sooner if worse

## 2021-01-28 ENCOUNTER — Inpatient Hospital Stay: Payer: Medicare Other | Attending: Hematology & Oncology

## 2021-01-28 ENCOUNTER — Ambulatory Visit (INDEPENDENT_AMBULATORY_CARE_PROVIDER_SITE_OTHER): Payer: Medicare Other | Admitting: Internal Medicine

## 2021-01-28 ENCOUNTER — Inpatient Hospital Stay: Payer: Medicare Other | Admitting: Hematology & Oncology

## 2021-01-28 ENCOUNTER — Encounter: Payer: Self-pay | Admitting: Hematology & Oncology

## 2021-01-28 ENCOUNTER — Encounter: Payer: Self-pay | Admitting: Internal Medicine

## 2021-01-28 ENCOUNTER — Other Ambulatory Visit: Payer: Self-pay

## 2021-01-28 VITALS — BP 104/58 | HR 64 | Temp 98.7°F | Resp 18 | Ht 65.0 in | Wt 92.1 lb

## 2021-01-28 VITALS — BP 118/68 | HR 70 | Temp 97.0°F | Ht 65.0 in | Wt 91.2 lb

## 2021-01-28 DIAGNOSIS — R413 Other amnesia: Secondary | ICD-10-CM

## 2021-01-28 DIAGNOSIS — D509 Iron deficiency anemia, unspecified: Secondary | ICD-10-CM | POA: Insufficient documentation

## 2021-01-28 DIAGNOSIS — E7800 Pure hypercholesterolemia, unspecified: Secondary | ICD-10-CM | POA: Diagnosis not present

## 2021-01-28 DIAGNOSIS — R636 Underweight: Secondary | ICD-10-CM

## 2021-01-28 DIAGNOSIS — Z0000 Encounter for general adult medical examination without abnormal findings: Secondary | ICD-10-CM

## 2021-01-28 DIAGNOSIS — I10 Essential (primary) hypertension: Secondary | ICD-10-CM | POA: Diagnosis not present

## 2021-01-28 DIAGNOSIS — D510 Vitamin B12 deficiency anemia due to intrinsic factor deficiency: Secondary | ICD-10-CM | POA: Diagnosis not present

## 2021-01-28 DIAGNOSIS — Z903 Acquired absence of stomach [part of]: Secondary | ICD-10-CM | POA: Diagnosis not present

## 2021-01-28 DIAGNOSIS — K909 Intestinal malabsorption, unspecified: Secondary | ICD-10-CM

## 2021-01-28 DIAGNOSIS — G35 Multiple sclerosis: Secondary | ICD-10-CM | POA: Insufficient documentation

## 2021-01-28 DIAGNOSIS — D002 Carcinoma in situ of stomach: Secondary | ICD-10-CM

## 2021-01-28 DIAGNOSIS — Z862 Personal history of diseases of the blood and blood-forming organs and certain disorders involving the immune mechanism: Secondary | ICD-10-CM

## 2021-01-28 DIAGNOSIS — D72819 Decreased white blood cell count, unspecified: Secondary | ICD-10-CM | POA: Insufficient documentation

## 2021-01-28 DIAGNOSIS — N319 Neuromuscular dysfunction of bladder, unspecified: Secondary | ICD-10-CM | POA: Diagnosis not present

## 2021-01-28 DIAGNOSIS — M810 Age-related osteoporosis without current pathological fracture: Secondary | ICD-10-CM

## 2021-01-28 DIAGNOSIS — E538 Deficiency of other specified B group vitamins: Secondary | ICD-10-CM

## 2021-01-28 DIAGNOSIS — Z85028 Personal history of other malignant neoplasm of stomach: Secondary | ICD-10-CM | POA: Diagnosis not present

## 2021-01-28 DIAGNOSIS — D500 Iron deficiency anemia secondary to blood loss (chronic): Secondary | ICD-10-CM

## 2021-01-28 LAB — POCT URINALYSIS DIPSTICK
Bilirubin, UA: NEGATIVE
Glucose, UA: NEGATIVE
Ketones, UA: NEGATIVE
Leukocytes, UA: NEGATIVE
Nitrite, UA: NEGATIVE
Protein, UA: NEGATIVE
Spec Grav, UA: 1.02 (ref 1.010–1.025)
Urobilinogen, UA: 0.2 E.U./dL
pH, UA: 5 (ref 5.0–8.0)

## 2021-01-28 LAB — CBC WITH DIFFERENTIAL (CANCER CENTER ONLY)
Abs Immature Granulocytes: 0.01 10*3/uL (ref 0.00–0.07)
Basophils Absolute: 0 10*3/uL (ref 0.0–0.1)
Basophils Relative: 1 %
Eosinophils Absolute: 0.1 10*3/uL (ref 0.0–0.5)
Eosinophils Relative: 2 %
HCT: 38.2 % (ref 36.0–46.0)
Hemoglobin: 12.1 g/dL (ref 12.0–15.0)
Immature Granulocytes: 0 %
Lymphocytes Relative: 31 %
Lymphs Abs: 1.1 10*3/uL (ref 0.7–4.0)
MCH: 30.1 pg (ref 26.0–34.0)
MCHC: 31.7 g/dL (ref 30.0–36.0)
MCV: 95 fL (ref 80.0–100.0)
Monocytes Absolute: 0.3 10*3/uL (ref 0.1–1.0)
Monocytes Relative: 9 %
Neutro Abs: 1.9 10*3/uL (ref 1.7–7.7)
Neutrophils Relative %: 57 %
Platelet Count: 157 10*3/uL (ref 150–400)
RBC: 4.02 MIL/uL (ref 3.87–5.11)
RDW: 12.1 % (ref 11.5–15.5)
WBC Count: 3.4 10*3/uL — ABNORMAL LOW (ref 4.0–10.5)
nRBC: 0 % (ref 0.0–0.2)

## 2021-01-28 LAB — CMP (CANCER CENTER ONLY)
ALT: 24 U/L (ref 0–44)
AST: 26 U/L (ref 15–41)
Albumin: 4.3 g/dL (ref 3.5–5.0)
Alkaline Phosphatase: 58 U/L (ref 38–126)
Anion gap: 6 (ref 5–15)
BUN: 13 mg/dL (ref 8–23)
CO2: 29 mmol/L (ref 22–32)
Calcium: 9.6 mg/dL (ref 8.9–10.3)
Chloride: 107 mmol/L (ref 98–111)
Creatinine: 0.87 mg/dL (ref 0.44–1.00)
GFR, Estimated: >60 mL/min (ref >60)
Glucose, Bld: 124 mg/dL — ABNORMAL HIGH (ref 70–99)
Potassium: 4.7 mmol/L (ref 3.5–5.1)
Sodium: 142 mmol/L (ref 135–145)
Total Bilirubin: 0.7 mg/dL (ref 0.3–1.2)
Total Protein: 7.4 g/dL (ref 6.5–8.1)

## 2021-01-28 LAB — IRON AND IRON BINDING CAPACITY (CC-WL,HP ONLY)
Iron: 129 ug/dL (ref 28–170)
Saturation Ratios: 35 % — ABNORMAL HIGH (ref 10.4–31.8)
TIBC: 365 ug/dL (ref 250–450)
UIBC: 236 ug/dL (ref 148–442)

## 2021-01-28 LAB — VITAMIN B12: Vitamin B-12: 474 pg/mL (ref 180–914)

## 2021-01-28 NOTE — Patient Instructions (Addendum)
See Dr. Marin Olp for surgical clearance for shoulder replacement. WBC count has been low.  It is a pleasure to see you today.  May return in 6 months.  No change in medications today.  Order placed for mammogram.

## 2021-01-28 NOTE — Progress Notes (Signed)
Hematology and Oncology Follow Up Visit  Erika Murphy [id number] 1950/07/03 71 y.o. 01/28/2021   Principle Diagnosis:  Stage II (T3N0M0) adenocarcinoma of the stomach-remission Recurrent iron deficiency anemia  Pernicious anemia Multiple sclerosis Current Therapy:   IV iron as indicated -- Venofer given on 04/2019 Vitamin B-12 1 mg IM every month --done at home     Interim History:  [name] is back for follow-up.  -She comes in little bit earlier for her visit.  I think the issue is that she has a little bit of leukopenia.  She is post to have right shoulder surgery.  Erika Murphy of [facility] is her orthopedist.  He is very very good.  He just wants to make sure that she will be safe to have surgery.  Her white cell count has trended upward.  I suspect that she probably has an element of ethnic associated leukopenia.  She also may have little bit of leukopenia from her medications.   She has had no problem with infections.  Her appetite is doing well.  She had no problems over the holiday season.  She has had no change in bowel or bladder habits.  Thankfully, her multiple sclerosis has not had any flareups.  Her iron studies that we did back in October showed a ferritin of 97 with an iron saturation of 28%.  Her vitamin B12 level back in October was 291.  Currently, her performance status is ECOG 2.    Medications:  Current Outpatient Medications:    albuterol (VENTOLIN HFA) 108 (90 Base) MCG/ACT inhaler, INHALE 2 PUFFS INTO THE LUNGS EVERY 6 HOURS AS NEEDED FOR SHORTNESS OF BREATH/WHEEZING, Disp: 8.5 each, Rfl: 11   ALPRAZolam (XANAX) 0.25 MG tablet, Take 1 tablet (0.25 mg total) by mouth at bedtime as needed for anxiety., Disp: 30 tablet, Rfl: 2   amLODipine (NORVASC) 2.5 MG tablet, TAKE 1 TABLET BY MOUTH EVERY DAY, Disp: 90 tablet, Rfl: 3   cyanocobalamin (,VITAMIN B-12,) 1000 MCG/ML injection, INJECT 1 ML (1,000 MCG TOTAL) INTO THE MUSCLE ONCE A MONTH AS DIRECTED,  Disp: 3 mL, Rfl: 3   esomeprazole (NEXIUM) 40 MG capsule, Take 1 capsule (40 mg total) by mouth daily at 12 noon., Disp: 90 capsule, Rfl: 3   HYDROcodone bit-homatropine (HYCODAN) 5-1.5 MG/5ML syrup, Take 5 mLs by mouth every 8 (eight) hours as needed for cough., Disp: 120 mL, Rfl: 0   lipase/protease/amylase (CREON) 36000 UNITS CPEP capsule, Take 2 capsules (72,000 Units total) by mouth 3 (three) times daily with meals. May also take 1 capsule (36,000 Units total) as needed (with snacks)., Disp: 240 capsule, Rfl: 11   meclizine (ANTIVERT) 25 MG tablet, Take 1 tablet (25 mg total) by mouth 3 (three) times daily as needed for dizziness., Disp: 30 tablet, Rfl: 0   Multiple Vitamins-Minerals (MULTIVITAMIN GUMMIES WOMENS PO), Take 1 each by mouth daily. , Disp: , Rfl:    pregabalin (LYRICA) 50 MG capsule, ONE CAPSULE 3 TIMES A DAY BY MOUTH, Disp: 90 capsule, Rfl: 0   rosuvastatin (CRESTOR) 5 MG tablet, TAKE ONE TABLET BY MOUTH WEEKLY WITH SUPPER FOR HYPERCHOLESTEROLEMIA, Disp: 12 tablet, Rfl: 3  Allergies: No Known Allergies  Past Medical History, Surgical history, Social history, and Family History were reviewed and updated.  Review of Systems: Review of Systems  Constitutional:  Positive for malaise/fatigue.  HENT:  Positive for tinnitus.   Eyes:  Positive for blurred vision.  Respiratory: Negative.    Cardiovascular: Negative.  Gastrointestinal:  Positive for diarrhea.  Genitourinary: Negative.   Musculoskeletal:  Positive for myalgias.  Skin: Negative.   Neurological:  Positive for tingling and weakness.  Endo/Heme/Allergies: Negative.   Psychiatric/Behavioral: Negative.     Physical Exam:  height is 5\' 5"  (1.651 m) and weight is 92 lb 1.3 oz (41.8 kg). Her oral temperature is 98.7 F (37.1 C). Her blood pressure is 104/58 (abnormal) and her pulse is 64. Her respiration is 18 and oxygen saturation is 100%.   Physical Exam Vitals reviewed.  HENT:     Head: Normocephalic and  atraumatic.  Eyes:     Pupils: Pupils are equal, round, and reactive to light.  Cardiovascular:     Rate and Rhythm: Normal rate and regular rhythm.     Heart sounds: Normal heart sounds.  Pulmonary:     Effort: Pulmonary effort is normal.     Breath sounds: Normal breath sounds.  Abdominal:     General: Bowel sounds are normal.     Palpations: Abdomen is soft.  Musculoskeletal:        General: No tenderness or deformity. Normal range of motion.     Cervical back: Normal range of motion.  Lymphadenopathy:     Cervical: No cervical adenopathy.  Skin:    General: Skin is warm and dry.     Findings: No erythema or rash.  Neurological:     Mental Status: She is alert and oriented to person, place, and time.  Psychiatric:        Behavior: Behavior normal.        Thought Content: Thought content normal.        Judgment: Judgment normal.     Lab Results  Component Value Date   WBC 3.4 (L) 01/28/2021   HGB 12.1 01/28/2021   HCT 38.2 01/28/2021   MCV 95.0 01/28/2021   PLT 157 01/28/2021     Chemistry      Component Value Date/Time   NA 142 01/28/2021 1129   NA 144 06/12/2016 1151   NA 142 06/11/2015 1156   K 4.7 01/28/2021 1129   K 4.7 06/12/2016 1151   K 4.3 06/11/2015 1156   CL 107 01/28/2021 1129   CL 107 06/12/2016 1151   CO2 29 01/28/2021 1129   CO2 32 06/12/2016 1151   CO2 29 06/11/2015 1156   BUN 13 01/28/2021 1129   BUN 11 06/12/2016 1151   BUN 13.5 06/11/2015 1156   CREATININE 0.87 01/28/2021 1129   CREATININE 1.07 (H) 01/21/2021 0936   CREATININE 1.2 (H) 06/11/2015 1156      Component Value Date/Time   CALCIUM 9.6 01/28/2021 1129   CALCIUM 9.6 06/12/2016 1151   CALCIUM 9.7 06/11/2015 1156   ALKPHOS 58 01/28/2021 1129   ALKPHOS 97 (H) 06/12/2016 1151   ALKPHOS 111 06/11/2015 1156   AST 26 01/28/2021 1129   AST 30 06/11/2015 1156   ALT 24 01/28/2021 1129   ALT 26 06/12/2016 1151   ALT 23 06/11/2015 1156   BILITOT 0.7 01/28/2021 1129   BILITOT 1.03  06/11/2015 1156      Impression and Plan: Erika Murphy is a 71 year old African [place] female. She had a history of stage II stomach cancer. She underwent resection. She had adjuvant therapy. This is now  21 years out from treatment. She is cured of the stomach cancer.   Her problem now is the right shoulder.  Again, I do not see any problems with her having surgery.  We  can always give her G-CSF if necessary prior to the procedure.  I told her that if there is any issues that Dr. Veverly Fells had, he can was give [place] a call.  I would like to see her back in May.  Hopefully she will have her surgery in March.   [name], MD 1/27/202312:38 PM

## 2021-01-28 NOTE — Progress Notes (Signed)
Annual Wellness Visit     Patient: Erika Murphy, Female    DOB: [birth year], 71 y.o.   MRN: [id number] Visit Date: 01/28/2021  Chief Complaint  Patient presents with   Medicare Wellness   Subjective    Erika Murphy is a 71 y.o. Female who presents today for her Annual Wellness Visit.  HPI Patient also here for health maintencne exam and evaluation of medical issues. Fortunately her weight has improved from 84 pounds in Sept 2022 to 91 pounds, 4 oz. currently.  Patient says Dr. Veverly Fells has recommended she undergo right shoulder arthroplasty.  She saw Dr. Veverly Fells in mid December with pain in right shoulder and neck.  Was thought to have rotator cuff tear arthropathy.  Patient has a history of iron deficiency anemia, osteoporosis,Multiple sclerosis which was diagnosed in 1998.  Sometimes she falls.  History of spastic gait disorder with lower distal extremity weakness and proximal weakness.  This presented as weakness in the right lower extremity.  She took Betaseron for a while but did not like the way it made her feel and discontinued it.  She ambulates with a cane due to gait disorder.  History of neuropathic pain involving upper and lower extremities treated with Lyrica.  History of left shoulder arthroplasty by Dr. Veverly Fells in 2011.  History of vitamin B-12 deficiency due to gastrectomy which she had due to gastric cancer.  Family member gives her monthly B12 injections.  She has had some memory issues in the past.  An MRI of the brain without contrast in January 2018 was stable compared to the one done in 2015.  In 2016 she had right total hip arthroplasty for right femoral fracture.  She is followed by Dr. Marin Olp, oncologist for gastric cancer.  Dr. Benson Norway is her Gastroenterologist.  She saw him in March 2022 for follow-up on weight loss.  She had endoscopy March 02, 2020 showing intact Billroth II.  No polyps noted however bile gastritis was noted.  Her symptoms gradually improved  and she began to gain weight.  Sometimes has urinary tract infection secondary to neurogenic bladder from multiple sclerosis.  She had colonoscopy and endoscopy in 2019 by Dr. Benson Norway with no evidence of recurrent cancer.  She does not drive.  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  She is now disabled and retired.  She does not smoke or consume alcohol.  She resides alone.  Family is supportive.  Family history: 1 sister died status post leg amputation with history of diabetes.  1 sister diagnosed with breast cancer.  Brother died with metastatic cancer but primary not known to patient.        Patient Care Team: Elby Showers, MD as PCP - General (Internal Medicine) Lorretta Harp, MD as PCP - Cardiology (Cardiology) [name], MD as Consulting Physician (Oncology)  Review of Systems see above-no abdominal pain   Objective    Vitals: BP 118/68    Pulse 70    Temp (!) 97 F (36.1 C) (Tympanic)    Ht 5\' 5"  (1.651 m)    Wt 91 lb 4 oz (41.4 kg)    SpO2 99%    BMI 15.18 kg/m   Physical Exam She is thin.  She weighed 88 pounds in July 2022, 84 pounds in September 2022 and now weighs 91 pounds.  Neck is supple without JVD thyromegaly or carotid bruits.  Chest is clear.  Breast are without masses.  Cardiac exam regular rate and rhythm without ectopy or murmur.  Abdomen without masses or tenderness.  No lower extremity pitting edema.  Affect thought and judgment are normal.  Most recent functional status assessment: In your present state of health, do you have any difficulty performing the following activities: 01/28/2021  Hearing? N  Vision? N  Difficulty concentrating or making decisions? N  Walking or climbing stairs? N  Doing errands, shopping? Y  Preparing Food and eating ? N  Using the Toilet? N  In the past six months, have you accidently leaked urine? N  Do you have problems with loss of  bowel control? N  Managing your Medications? N  Managing your Finances? N  Some recent data might be hidden   Most recent fall risk assessment: Fall Risk  01/28/2021  Falls in the past year? 0  Number falls in past yr: 0  Injury with Fall? 0  Risk for fall due to : Impaired mobility  Follow up Falls evaluation completed    Most recent depression screenings: PHQ 2/9 Scores 01/28/2021 01/22/2020  PHQ - 2 Score 0 0  PHQ- 9 Score - -   Most recent cognitive screening: 6CIT Screen 01/28/2021  What Year? 0 points  What month? 0 points  What time? 0 points  Count back from 20 0 points  Months in reverse 0 points  Repeat phrase 0 points  Total Score 0       Assessment & Plan   Right shoulder arthropathy-has seen Dr. Veverly Fells and surgery is being planned if okay with Dr. Marin Olp  History of left shoulder arthroplasty by Dr. Veverly Fells in 2011  History of vitamin B-12 deficiency due to gastrectomy which she had due to gastric cancer.  Family member gives her monthly B12 injections.  Osteoporosis-has not wanted to be on Prolia and Boniva would likely not agree with her GI tract  GE reflux treated with Nexium  Mild hypertension treated with amlodipine 2.5 mg daily.  Currently LDL is slightly elevated at 110 and previously a year ago was 133  Hyperlipidemia treated with Crestor 5 mg daily  History of H. pylori infection in 2006  History of weight loss which has improved  Multiple sclerosis-currently not on treatment.  Diagnosed in 1998.  Has had some mild memory loss.  MRI of the brain without contrast in January 2018 was stable compared to 2015.  Longstanding history of leukopenia followed by oncology.  White blood cell count ranges from 1900 to 3400 over the past several years.  Iron and iron binding capacity checked in October 2022 was normal  History of neuropathic pain involving upper and lower extremities treated with Lyrica.  Status post right total hip arthroplasty for  right femoral fracture in 2016  Followed by Dr. Marin Olp, oncologist for history of gastric cancer.  She has a history of subtotal gastrectomy and partial omentectomy in 2001 for gastric carcinoma.  History of spastic gait disorder with more distal weakness than proximal weakness.  She discontinued Betaseron because she did not like the way it made her feel  Occasional urinary tract infection secondary to neurogenic bladder from multiple sclerosis  Essential hypertension  History of iron deficiency secondary to gastrectomy  History of anxiety  Plan: Patient can return in 1 year or as needed.  She will continue close follow-up with Dr. Marin Olp.    Annual wellness visit done today including the all of the following: Reviewed patient's Family  Medical History Reviewed and updated list of patient's medical providers Assessment of cognitive impairment was done Assessed patient's functional ability Established a written schedule for health screening [place] Completed and Reviewed  Discussed health benefits of physical activity, and encouraged her to engage in regular exercise appropriate for her age and condition.         {I, Elby Showers, MD, have reviewed all documentation for this visit. The documentation on 01/31/21 for the exam, diagnosis, procedures, and orders are all accurate and complete.   Angus Seller, CMA

## 2021-01-31 LAB — FERRITIN: Ferritin: 99 ng/mL (ref 11–307)

## 2021-02-11 ENCOUNTER — Encounter: Payer: Self-pay | Admitting: Hematology & Oncology

## 2021-02-11 ENCOUNTER — Ambulatory Visit
Admission: RE | Admit: 2021-02-11 | Discharge: 2021-02-11 | Disposition: A | Discharge status: Home / Self Care | Payer: Medicare Other | Source: Ambulatory Visit | Attending: Internal Medicine | Admitting: Internal Medicine

## 2021-02-11 DIAGNOSIS — Z1231 Encounter for screening mammogram for malignant neoplasm of breast: Secondary | ICD-10-CM | POA: Diagnosis not present

## 2021-02-23 ENCOUNTER — Telehealth: Payer: Self-pay | Admitting: Internal Medicine

## 2021-02-23 ENCOUNTER — Other Ambulatory Visit: Payer: Self-pay | Admitting: Internal Medicine

## 2021-02-23 NOTE — Telephone Encounter (Signed)
Erika Murphy [phone]  [name] called to say she has an odor when she urinates and irritation, scheduled her for office visit tomorrow afternoon for possible UTI.

## 2021-02-24 ENCOUNTER — Ambulatory Visit (INDEPENDENT_AMBULATORY_CARE_PROVIDER_SITE_OTHER): Payer: Medicare Other | Admitting: Internal Medicine

## 2021-02-24 ENCOUNTER — Other Ambulatory Visit: Payer: Self-pay

## 2021-02-24 ENCOUNTER — Encounter: Payer: Self-pay | Admitting: Internal Medicine

## 2021-02-24 ENCOUNTER — Other Ambulatory Visit (HOSPITAL_COMMUNITY)
Admission: RE | Admit: 2021-02-24 | Discharge: 2021-02-24 | Disposition: A | Discharge status: Home / Self Care | Payer: Medicare Other | Source: Ambulatory Visit | Attending: Internal Medicine | Admitting: Internal Medicine

## 2021-02-24 VITALS — BP 118/68 | HR 72 | Temp 98.5°F

## 2021-02-24 DIAGNOSIS — Z124 Encounter for screening for malignant neoplasm of cervix: Secondary | ICD-10-CM | POA: Diagnosis not present

## 2021-02-24 DIAGNOSIS — N898 Other specified noninflammatory disorders of vagina: Secondary | ICD-10-CM | POA: Diagnosis not present

## 2021-02-24 MED ORDER — METRONIDAZOLE 500 MG PO TABS: 500.0000 mg | ORAL_TABLET | Freq: Two times a day (BID) | ORAL | 0 refills | Status: DC

## 2021-02-24 NOTE — Progress Notes (Signed)
° °  Subjective:    Patient ID: Erika Murphy, female    DOB: [birth year], 71 y.o.   MRN: [id number]  HPI 71 year old Female with MS seen for complaint of vaginal odor and slight discharge.  She is postmenopausal and not sexually active.  She has no dysuria.  No fever or chills.  No history of recurrent urinary infections.  History of right shoulder pain and is to undergo right shoulder arthroplasty in the future by Dr. Alma Friendly.  History of iron deficiency anemia, osteoporosis, multiple sclerosis which was diagnosed in 1998.  History of spastic gait disorder with lower distal extremity weakness and proximal weakness.  Currently not on any medication for multiple sclerosis.  She ambulates with a cane due to gait disorder.  History of neuropathic pain involving upper and lower extremities treated with Lyrica.  Had left shoulder arthroplasty by Dr. Veverly Fells in 2011.  History of vitamin B12 deficiency due to gastrectomy which she had due to gastric cancer.  Family member gives her monthly B12 injections.  She has had some memory issues in the past.  An MRI of the brain without contrast in January 2018 was stable compared to the one done in 2015.  In 2016 she had right total hip arthroplasty for right femoral fracture.  She is followed by Dr. Marin Olp, oncologist for gastric cancer.  Occasionally has had urinary tract infection secondary to neurogenic bladder from multiple sclerosis but not recently.  For social history and family history see dictation January 28, 2021.  She resides alone and does not drive.    Review of Systems see above     Objective:   Physical Exam Vital signs reviewed.  Blood pressure 118/68 pulse 72 temperature 98.5 degrees orally, pulse oximetry 98%.  She has white vaginal discharge.  Wet prep shows white cells and bacteria but rare clue cells.  She was unable to urinate today in the office.     Assessment & Plan:   Bacterial vaginosis  Plan: Flagyl 500 mg twice  daily for 7 days.  At end of this oral medication treatment she is to use a vinegar and water douche once.

## 2021-02-24 NOTE — Patient Instructions (Signed)
You have been diagnosed with bacterial vaginosis.  Take metronidazole twice daily for 7 days and then use vinegar and water douche.

## 2021-03-01 ENCOUNTER — Telehealth: Payer: Self-pay | Admitting: Internal Medicine

## 2021-03-01 ENCOUNTER — Encounter: Payer: Self-pay | Admitting: Internal Medicine

## 2021-03-01 LAB — CYTOLOGY - PAP: Diagnosis: NEGATIVE

## 2021-03-01 MED ORDER — METRONIDAZOLE 0.75 % VA GEL: 1.0000 | Freq: Two times a day (BID) | VAGINAL | 0 refills | Status: DC

## 2021-03-01 NOTE — Telephone Encounter (Signed)
Complaining of nausea on metronidazole. Does not alsways eat much and likely this is the reason for nausea. Offered Zofran or changing to vaginal preparation. She prefers vaginal preparation Send in Metronidazole vaginal preparation- one applicatorful in vagina twice daily.

## 2021-03-01 NOTE — Telephone Encounter (Signed)
Erika Murphy called to say she only took one of the pills you gave her and she has been nauseous, stomach is hurting and been upset every since. Is there something else she can take or do?

## 2021-03-23 ENCOUNTER — Telehealth: Payer: Self-pay

## 2021-03-23 NOTE — Telephone Encounter (Signed)
Pt called asking if our office has heard from Dr. Veverly Fells about being able to schedule her shoulder surgery due to her WBC. Called pt back and informed did put in his note it was okay to schedule and depending on her counts prior to surgery date she could be eligible for an injection to help her WBC. Pt verbalized and asked if we could forward this note to Dr.Norris. faxed note from Dr.Ennevers last encounter to [facility]. Pt declined any other questions or concerns at this time.  ?

## 2021-03-28 DIAGNOSIS — H401121 Primary open-angle glaucoma, left eye, mild stage: Secondary | ICD-10-CM | POA: Diagnosis not present

## 2021-04-14 DIAGNOSIS — H401111 Primary open-angle glaucoma, right eye, mild stage: Secondary | ICD-10-CM | POA: Diagnosis not present

## 2021-04-18 ENCOUNTER — Telehealth: Payer: Self-pay | Admitting: *Deleted

## 2021-04-18 NOTE — Telephone Encounter (Signed)
Received a call from Erika Murphy wanting to schedule an injection prior to May 26 shoulder surgery.  The physician doing the surgery wanted to make sure her WBC was high enough to have the surgery safely.  This was documented in Dr Dicie Beam office note on  1.27.23.  Dr Marin Olp would like for patient to get labwork 2 days prior to surgery and will decide on whether she needs an injection at that time.  Contacted Ross Ludwig about what CSF she could receive.  She stated that she could have Zarxio  for Short acting and  Ziextenzo for long acting depending on what Dr Marin Olp decides to give.  Patient contacted and this information was given to her.  Appt for lab made.  Patient aware of instructions ?

## 2021-04-25 ENCOUNTER — Encounter: Payer: Self-pay | Admitting: Hematology & Oncology

## 2021-05-03 NOTE — H&P (Signed)
?Patient's anticipated LOS is less than 2 midnights, meeting these requirements: ?- Younger than 74 ?- Lives within 1 hour of care ?- Has a competent adult at home to recover with post-op recover ?- NO history of ? - Chronic pain requiring opiods ? - Diabetes ? - Coronary Artery Disease ? - Heart failure ? - Heart attack ? - Stroke ? - DVT/VTE ? - Cardiac arrhythmia ? - Respiratory Failure/COPD ? - Renal failure ? - Anemia ? - Advanced Liver disease ? ?  ? ?Erika Murphy is an 71 y.o. female.   ? ?Chief Complaint: right shoulder pain ? ?HPI: Pt is a 71 y.o. female complaining of right shoulder pain for multiple years. Pain had continually increased since the beginning. X-rays in the clinic show end-stage arthritic changes of the right shoulder. Pt has tried various conservative treatments which have failed to alleviate their symptoms, including injections and therapy. Various options are discussed with the patient. Risks, benefits and expectations were discussed with the patient. Patient understand the risks, benefits and expectations and wishes to proceed with surgery.  ? ?PCP:  Elby Showers, MD ? ?D/C Plans: Home ? ?PMH: ?Past Medical History:  ?Diagnosis Date  ? Anxiety   ? Arthritis   ? "right shoulder" (10/09/2013)  ? Asthma   ? pt denies  ? B12 deficiency anemia   ? pt denies  ? Fibromyalgia   ? GERD (gastroesophageal reflux disease)   ? History of blood transfusion 2001  ? "when I had my stomach cancer"  ? Hypertension   ? IBS (irritable bowel syndrome)   ? Iron deficiency anemia 12/11/2014  ? Malabsorption of iron 12/11/2014  ? MS (multiple sclerosis) ([place]) dx'd ~ 1998  ? Neurogenic bladder   ? Osteopenia   ? Osteoporosis   ? PONV (postoperative nausea and vomiting)   ? Stomach cancer ([place]) 2001  ? Thyroid nodule   ? right  ? ? ?PSH: ?Past Surgical History:  ?Procedure Laterality Date  ? BIOPSY THYROID Right 1990's  ? [place]; 1984  ? COLONOSCOPY WITH PROPOFOL N/A 05/04/2017  ? Procedure:  COLONOSCOPY WITH PROPOFOL;  Surgeon: Carol Ada, MD;  Location: [facility];  Service: Endoscopy;  Laterality: N/A;  ? ESOPHAGOGASTRODUODENOSCOPY (EGD) WITH PROPOFOL N/A 05/04/2017  ? Procedure: ESOPHAGOGASTRODUODENOSCOPY (EGD) WITH PROPOFOL;  Surgeon: Carol Ada, MD;  Location: [facility];  Service: Endoscopy;  Laterality: N/A;  ? JOINT REPLACEMENT    ? OMENTECTOMY  2001  ? partial  ? PARTIAL GASTRECTOMY  2001  ? TONSILLECTOMY  ~ 1959  ? TOTAL HIP ARTHROPLASTY Right 08/18/2014  ? Procedure: TOTAL HIP ARTHROPLASTY ANTERIOR APPROACH;  Surgeon: Rod Can, MD;  Location: [facility];  Service: Orthopedics;  Laterality: Right;  ? TOTAL SHOULDER ARTHROPLASTY Left 2011  ? ? ?Social History:  reports that she has never smoked. She has never used smokeless tobacco. She reports that she does not drink alcohol and does not use drugs. ?BMI: ?Estimated body mass index is 15.32 kg/m? as calculated from the following: ?  Height as of 01/28/21: '5\' 5"'$  (1.651 m). ?  Weight as of 01/28/21: 41.8 kg. ? ?Lab Results  ?Component Value Date  ? ALBUMIN 4.3 01/28/2021  ? ?Diabetes: ?Patient does not have a diagnosis of diabetes. ?  ?  ?Smoking Status: ?  reports that she has never smoked. She has never used smokeless tobacco. ? ?  ?Allergies:  ?No Known Allergies ? ?Medications: ?No current facility-administered medications for this encounter.  ? ?  Current Outpatient Medications  ?Medication Sig Dispense Refill  ? albuterol (VENTOLIN HFA) 108 (90 Base) MCG/ACT inhaler INHALE 2 PUFFS INTO THE LUNGS EVERY 6 HOURS AS NEEDED FOR SHORTNESS OF BREATH/WHEEZING 8.5 each 11  ? ALPRAZolam (XANAX) 0.25 MG tablet Take 1 tablet (0.25 mg total) by mouth at bedtime as needed for anxiety. 30 tablet 2  ? amLODipine (NORVASC) 2.5 MG tablet TAKE 1 TABLET BY MOUTH EVERY DAY 90 tablet 3  ? cyanocobalamin (,VITAMIN B-12,) 1000 MCG/ML injection INJECT 1 ML (1,000 MCG TOTAL) INTO THE MUSCLE ONCE A MONTH AS DIRECTED 3 mL 3  ? esomeprazole (NEXIUM) 40 MG capsule  Take 1 capsule (40 mg total) by mouth daily at 12 noon. 90 capsule 3  ? HYDROcodone bit-homatropine (HYCODAN) 5-1.5 MG/5ML syrup Take 5 mLs by mouth every 8 (eight) hours as needed for cough. 120 mL 0  ? lipase/protease/amylase (CREON) 36000 UNITS CPEP capsule Take 2 capsules (72,000 Units total) by mouth 3 (three) times daily with meals. May also take 1 capsule (36,000 Units total) as needed (with snacks). 240 capsule 11  ? meclizine (ANTIVERT) 25 MG tablet Take 1 tablet (25 mg total) by mouth 3 (three) times daily as needed for dizziness. 30 tablet 0  ? metroNIDAZOLE (METROGEL) 0.75 % vaginal gel Place 1 Applicatorful vaginally 2 (two) times daily. 70 g 0  ? Multiple Vitamins-Minerals (MULTIVITAMIN GUMMIES WOMENS PO) Take 1 each by mouth daily.     ? pregabalin (LYRICA) 50 MG capsule ONE CAPSULE 3 TIMES A DAY BY MOUTH 90 capsule 3  ? rosuvastatin (CRESTOR) 5 MG tablet TAKE ONE TABLET BY MOUTH WEEKLY WITH SUPPER FOR HYPERCHOLESTEROLEMIA 12 tablet 3  ? ? ?No results found for this or any previous visit (from the past 48 hour(s)). ?No results found. ? ?ROS: ?Pain with rom of the right upper extremity ? ?Physical Exam: ?Alert and oriented 71 y.o. female in no acute distress ?Cranial nerves 2-12 intact ?Cervical spine: full rom with no tenderness, nv intact distally ?Chest: active breath sounds bilaterally, no wheeze rhonchi or rales ?Heart: regular rate and rhythm, no murmur ?Abd: non tender non distended with active bowel sounds ?Hip is stable with rom  ?Right shoulder painful and weak rom ?Nv intact distally ?No rashes or edema ? ? ?Assessment/Plan ?Assessment: right shoulder cuff arthropathy ? ?Plan: ? ?Patient will undergo a right reverse shoulder by Dr. Veverly Fells at [facility] benefits and expectations were discussed with the patient. Patient understand risks, benefits and expectations and wishes to proceed. ?Preoperative templating of the joint replacement has been completed, documented, and submitted to the  Operating Room personnel in order to optimize intra-operative equipment management.  ? ?Merla Riches PA-C, MPAS ?[facility] ?[address], [address], [place], [place] [address] ?Phone: [phone] ?[email or web address] ?Facebook  Engineer, structural  ? ?  ? ?

## 2021-05-05 DIAGNOSIS — H401131 Primary open-angle glaucoma, bilateral, mild stage: Secondary | ICD-10-CM | POA: Diagnosis not present

## 2021-05-13 ENCOUNTER — Encounter: Payer: Self-pay | Admitting: Hematology & Oncology

## 2021-05-13 ENCOUNTER — Ambulatory Visit (INDEPENDENT_AMBULATORY_CARE_PROVIDER_SITE_OTHER): Payer: Medicare Other | Admitting: Internal Medicine

## 2021-05-13 ENCOUNTER — Encounter: Payer: Self-pay | Admitting: Internal Medicine

## 2021-05-13 VITALS — BP 128/68 | HR 66 | Temp 98.4°F | Ht 65.0 in | Wt 89.5 lb

## 2021-05-13 DIAGNOSIS — I10 Essential (primary) hypertension: Secondary | ICD-10-CM

## 2021-05-13 DIAGNOSIS — M810 Age-related osteoporosis without current pathological fracture: Secondary | ICD-10-CM

## 2021-05-13 DIAGNOSIS — Z01810 Encounter for preprocedural cardiovascular examination: Secondary | ICD-10-CM | POA: Diagnosis not present

## 2021-05-13 DIAGNOSIS — Z01818 Encounter for other preprocedural examination: Secondary | ICD-10-CM

## 2021-05-13 DIAGNOSIS — N319 Neuromuscular dysfunction of bladder, unspecified: Secondary | ICD-10-CM | POA: Diagnosis not present

## 2021-05-13 DIAGNOSIS — Z862 Personal history of diseases of the blood and blood-forming organs and certain disorders involving the immune mechanism: Secondary | ICD-10-CM | POA: Diagnosis not present

## 2021-05-13 DIAGNOSIS — Z903 Acquired absence of stomach [part of]: Secondary | ICD-10-CM

## 2021-05-13 DIAGNOSIS — G35 Multiple sclerosis: Secondary | ICD-10-CM | POA: Diagnosis not present

## 2021-05-13 DIAGNOSIS — R0789 Other chest pain: Secondary | ICD-10-CM | POA: Diagnosis not present

## 2021-05-13 DIAGNOSIS — E538 Deficiency of other specified B group vitamins: Secondary | ICD-10-CM

## 2021-05-13 DIAGNOSIS — R54 Age-related physical debility: Secondary | ICD-10-CM | POA: Diagnosis not present

## 2021-05-13 DIAGNOSIS — Z85028 Personal history of other malignant neoplasm of stomach: Secondary | ICD-10-CM

## 2021-05-13 DIAGNOSIS — R636 Underweight: Secondary | ICD-10-CM

## 2021-05-13 DIAGNOSIS — M19011 Primary osteoarthritis, right shoulder: Secondary | ICD-10-CM

## 2021-05-13 LAB — POCT URINALYSIS DIPSTICK
Bilirubin, UA: NEGATIVE
Blood, UA: NEGATIVE
Glucose, UA: NEGATIVE
Ketones, UA: NEGATIVE
Leukocytes, UA: NEGATIVE
Nitrite, UA: NEGATIVE
Protein, UA: NEGATIVE
Spec Grav, UA: 1.02 (ref 1.010–1.025)
Urobilinogen, UA: 0.2 E.U./dL
pH, UA: 5 (ref 5.0–8.0)

## 2021-05-13 MED ORDER — DENOSUMAB 60 MG/ML ~~LOC~~ SOSY
60.0000 mg | PREFILLED_SYRINGE | Freq: Once | SUBCUTANEOUS | Status: AC
  Administered 2021-05-13: 60 mg via SUBCUTANEOUS

## 2021-05-13 MED ORDER — ALPRAZOLAM 0.25 MG PO TABS
0.2500 mg | ORAL_TABLET | Freq: Every evening | ORAL | 2 refills | Status: DC | PRN
Start: 2021-05-13 — End: 2021-06-08

## 2021-05-13 NOTE — Progress Notes (Signed)
Subjective:    Patient ID: Erika Murphy, female    DOB: 20-Mar-1950, 71 y.o.   MRN: [id number]  HPI 71 year old Female for preoperative evaluation.  She is scheduled for  Right reverse shoulder arthroplasty by Dr. Veverly Fells on May 26th.  Previously had left shoulder arthroplasty by Dr. Veverly Fells in 2011.  We have been watching her weight fairly closely.  She has a history of multiple sclerosis, history of iron deficiency anemia, osteoporosis and spastic gait disorder involving lower distal extremity weakness and proximal weakness.  Multiple sclerosis was diagnosed in 1998 and presented as a weakness in the right lower extremity.  She took Betaseron for a while but did not like the way it made her feel and she discontinued it.  She ambulates with a cane due to a gait disorder.  She takes Lyrica for neuropathic pain involving upper and lower extremities.  In September 2022 her weight was 84 pounds and by January 2023 and had increased to 91 pounds 4 ounces.  Today she weighs 89 pounds 8 ounces with a BMI of 14.89.  She says she is eating fairly well.  She is followed by Dr. Marin Olp, Oncologist for history of gastric cancer.  She had colonoscopy and endoscopy in 2019 by Dr. Benson Norway with no evidence of recurrent gastric cancer.  History of vitamin B12 deficiency due to gastrectomy which she had due to gastric cancer.  A family member gets her monthly B12 injections.  She has had some memory issues in the past.  An MRI of the brain without contrast was done in January 2018 and was stable compared to 2015.  In 2016 she had a right total hip arthroplasty for right femoral fracture.  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  She does not smoke or consume alcohol.  Her family is supportive.  Family history: 1 sister died status post leg amputation with history of diabetes.  Sister has been diagnosed with breast cancer.  Brother died with  metastatic cancer.  She takes Prolia for osteoporosis.  Last bone density study October 2021 showed a T score in the forearm of -2.6.  Due for repeat study October 2023.  History of pure hypercholesterolemia treated with Crestor 5 mg weekly.  She has no complaint of chest pain or shortness of breath.  EKG obtained reviewed today is within normal limits and similar to EKG obtained March 2021 obtained due to left scapular pain and shortness of breath. Had myocardial perfusion study in 2019 read as normal.  Has seen Dr. Marshall Cork, Ophthalmologist , with [facility] in June 2020.  Apparently is a glaucoma suspect.has bilateral cataracts.Reviewed report in Epic under Care Everywhere. No recent report is filed.  Seen at [facility] for MS. Has seen Dr. Nelva Bush for back pain in 2021 treated with Dexamethasone injection.  Hx of iron deficiency also due to subtotal gastrectomy and partial omentectomy in 2001.  Right hip arthroplasty after a fall with right femoral fracture 2016.  Ambulates with a cane due to multiple sclerosis gait disorder.  History of essential hypertension.      Review of Systems see above     Objective:   Physical Exam Thin, Black Female seen in NAD. BP 128/68, pulse 66, T 98.4 degrees, pulse ox 98% weight 89 pounds 8 oz. BMI 14.89.  Skin is warm and dry.  TMs are clear. Neck is supple.  No  thyromegaly.  No carotid bruits.  Chest is clear.  Cardiac exam: Regular rate and rhythm without ectopy.  Abdomen: Soft nondistended without hepatosplenomegaly masses or tenderness.  No lower extremity pitting edema.  Affect thought and judgment appear to be normal.  Brief neurological exam intact.  She ambulates with a cane due to lower extremity weakness.       Assessment & Plan:  Preoperative evaluation for right shoulder arthroplasty due to osteoarthritis and pain  History of Multiple sclerosis-not on disease modifying agents and followed by [facility]  Neurology  History of gastric cancer status post subtotal gastrectomy  History of left shoulder arthroplasty  History of B12 deficiency due to subtotal gastrectomy and receives B12 injections monthly.  History of iron deficiency anemia followed by Dr. Marin Olp.  Colonoscopy is up-to-date.  Cannot absorb iron due to subtotal gastrectomy.  Osteoporosis-treated with Prolia.  Bone density study is due fall 2023  Underweight-her weight is monitored closely  History of low back pain seen by Dr. Nelva Bush  EKG reviewed and is within normal limits.  Hemoglobin A1c stable at 5%.  Urinalysis is normal.  Recently hemoglobin was 12 g and c-Met was essentially normal except for creatinine of 1.06.  Pro time INR normal.  Patient tends to run low white blood cell count and has done this for years-white blood cell count generally in the 3000 range.  This is also followed by Dr. Marin Olp.  Do not see any contraindications to this surgery at this time.

## 2021-05-14 LAB — COMPLETE METABOLIC PANEL WITH GFR
AG Ratio: 1.4 (calc) (ref 1.0–2.5)
ALT: 29 U/L (ref 6–29)
AST: 33 U/L (ref 10–35)
Albumin: 4 g/dL (ref 3.6–5.1)
Alkaline phosphatase (APISO): 64 U/L (ref 37–153)
BUN/Creatinine Ratio: 14 (calc) (ref 6–22)
BUN: 15 mg/dL (ref 7–25)
CO2: 30 mmol/L (ref 20–32)
Calcium: 9.3 mg/dL (ref 8.6–10.4)
Chloride: 106 mmol/L (ref 98–110)
Creat: 1.06 mg/dL — ABNORMAL HIGH (ref 0.60–1.00)
Globulin: 2.8 g/dL (calc) (ref 1.9–3.7)
Glucose, Bld: 128 mg/dL — ABNORMAL HIGH (ref 65–99)
Potassium: 4.2 mmol/L (ref 3.5–5.3)
Sodium: 142 mmol/L (ref 135–146)
Total Bilirubin: 0.7 mg/dL (ref 0.2–1.2)
Total Protein: 6.8 g/dL (ref 6.1–8.1)
eGFR: 56 mL/min/{1.73_m2} — ABNORMAL LOW (ref >=60)

## 2021-05-14 LAB — CBC WITH DIFFERENTIAL/PLATELET
Absolute Monocytes: 285 cells/uL (ref 200–950)
Basophils Absolute: 19 cells/uL (ref 0–200)
Basophils Relative: 0.6 %
Eosinophils Absolute: 61 cells/uL (ref 15–500)
Eosinophils Relative: 1.9 %
HCT: 36.9 % (ref 35.0–45.0)
Hemoglobin: 12 g/dL (ref 11.7–15.5)
Lymphs Abs: 1056 cells/uL (ref 850–3900)
MCH: 30.4 pg (ref 27.0–33.0)
MCHC: 32.5 g/dL (ref 32.0–36.0)
MCV: 93.4 fL (ref 80.0–100.0)
MPV: 11.3 fL (ref 7.5–12.5)
Monocytes Relative: 8.9 %
Neutro Abs: 1779 cells/uL (ref 1500–7800)
Neutrophils Relative %: 55.6 %
Platelets: 154 10*3/uL (ref 140–400)
RBC: 3.95 10*6/uL (ref 3.80–5.10)
RDW: 11.5 % (ref 11.0–15.0)
Total Lymphocyte: 33 %
WBC: 3.2 10*3/uL — ABNORMAL LOW (ref 3.8–10.8)

## 2021-05-14 LAB — HEMOGLOBIN A1C
Hgb A1c MFr Bld: 5 % of total Hgb (ref <5.7)
Mean Plasma Glucose: 97 mg/dL
eAG (mmol/L): 5.4 mmol/L

## 2021-05-14 LAB — PROTIME-INR
INR: 1
Prothrombin Time: 10.9 s (ref 9.0–11.5)

## 2021-05-16 NOTE — Patient Instructions (Addendum)
DUE TO COVID-19 ONLY TWO VISITORS  (aged 73 and older)  ARE ALLOWED TO COME WITH YOU AND STAY IN THE WAITING ROOM ONLY DURING PRE OP AND PROCEDURE.   ?**NO VISITORS ARE ALLOWED IN THE SHORT STAY AREA OR RECOVERY ROOM!!** ? ?IF YOU WILL BE ADMITTED INTO THE HOSPITAL YOU ARE ALLOWED ONLY FOUR SUPPORT PEOPLE DURING VISITATION HOURS ONLY (7 AM -8PM)   ?The support person(s) must pass our screening, gel in and out, and wear a mask at all times, including in the patient?s room. ?Patients must also wear a mask when staff or their support person are in the room. ?Visitors GUEST BADGE MUST BE WORN VISIBLY  ?One adult visitor may remain with you overnight and MUST be in the room by 8 P.M. ?  ? ? Your procedure is scheduled on: 05/27/21 ? ? Report to [facility] Main Entrance ? ?  Report to admitting at  7:20 AM ? ? Call this number if you have problems the morning of surgery [phone] ? ? Do not eat food :After Midnight. ? ? After Midnight you may have the following liquids until 7:00_ AM/  DAY OF SURGERY ? ?Water ?Black Coffee (sugar ok, NO MILK/CREAM OR CREAMERS)  ?Tea (sugar ok, NO MILK/CREAM OR CREAMERS) regular and decaf                             ?Plain Jell-O (NO RED)                                           ?Fruit ices (not with fruit pulp, NO RED)                                     ?Popsicles (NO RED)                                                                  ?Juice: apple, WHITE grape, WHITE cranberry ?Sports drinks like Gatorade (NO RED) ?Clear broth(vegetable,chicken,beef) ? ?             ? ?  ?  ?The day of surgery:  ?Drink ONE (1) Pre-Surgery Clear Ensure at 6:45 AM the morning of surgery. Drink in one sitting. Do not sip.  ?This drink was given to you during your hospital  ?pre-op appointment visit. ?Nothing else to drink after completing the  ?Pre-Surgery Clear Ensure at 7:00 AM ?  ?       If you have questions, please contact your surgeon?s office. ? ? ?  ?  ?Oral Hygiene is also important  to reduce your risk of infection.                                    ?Remember - BRUSH YOUR TEETH THE MORNING OF SURGERY WITH YOUR REGULAR TOOTHPASTE ? ? ? Take these medicines the morning of surgery with A SIP OF WATER: Lyrica, Amlodipine, Nexium and use your inhalers  and bring them with you ? ? ?Bring CPAP mask and tubing day of surgery. ?                  ?           You may not have any metal on your body including hair pins, jewelry, and body piercing ? ?           Do not wear make-up, lotions, powders, perfumes/cologne, or deodorant ? ?Do not wear nail polish including gel and S&S, artificial/acrylic nails, or any other type of covering on natural nails including finger and toenails. If you have artificial nails, gel coating, etc. that needs to be removed by a nail salon please have this removed prior to surgery or surgery may need to be canceled/ delayed if the surgeon/ anesthesia feels like they are unable to be safely monitored.  ? ?Do not shave  48 hours prior to surgery.  ? ? ? Do not bring valuables to the hospital. [facility] NOT ?            RESPONSIBLE   FOR VALUABLES. ? ? Contacts, dentures or bridgework may not be worn into surgery. ? ? Bring small overnight bag day of surgery. ?  ? Patients discharged on the day of surgery will not be allowed to drive home.  Someone NEEDS to stay with you for the first 24 hours after anesthesia. ? ? Special Instructions: Bring a copy of your healthcare power of attorney and living will documents the day of surgery if you haven't scanned them before. ? ?            Please read over the following fact sheets you were given: IF [facility] [phone] ?[facility]- Preparing for Total Shoulder Arthroplasty  ?  ?Before surgery, you can play an important role. Because skin is not sterile, your skin needs to be as free of germs as possible. You can reduce the number of germs on your skin by using the following  products. ?Benzoyl Peroxide Gel ?Reduces the number of germs present on the skin ?Applied twice a day to shoulder area starting two days before surgery   ? ?================================================================== ? ?Please follow these instructions carefully: ? ?BENZOYL PEROXIDE 5% GEL ? ?Please do not use if you have an allergy to benzoyl peroxide.   If your skin becomes reddened/irritated stop using the benzoyl peroxide. ? ?Starting two days before surgery, apply as follows: ?Apply benzoyl peroxide in the morning and at night. Apply after taking a shower. If you are not taking a shower clean entire shoulder front, back, and side along with the armpit with a clean wet washcloth. ? ?Place a quarter-sized dollop on your shoulder and rub in thoroughly, making sure to cover the front, back, and side of your shoulder, along with the armpit.  ? ?2 days before ____ AM   ____ PM              1 day before ____ AM   ____ PM ?                        ?Do this twice a day for two days.  (Last application is the night before surgery, AFTER using the CHG soap as described below). ? ?Do NOT apply benzoyl peroxide gel on the day of surgery.  ?   [facility] - Preparing for Surgery ?Before surgery,  you can play an important role.  Because skin is not sterile, your skin needs to be as free of germs as possible.  You can reduce the number of germs on your skin by washing with CHG (chlorahexidine gluconate) soap before surgery.  CHG is an antiseptic cleaner which kills germs and bonds with the skin to continue killing germs even after washing. ?Please DO NOT use if you have an allergy to CHG or antibacterial soaps.  If your skin becomes reddened/irritated stop using the CHG and inform your nurse when you arrive at Short Stay. ?Do not shave (including legs and underarms) for at least 48 hours prior to the first CHG shower.   ?Please follow these instructions carefully: ? 1.  Shower with CHG Soap the night before surgery and  the  morning of Surgery. ? 2.  If you choose to wash your hair, wash your hair first as usual with your  normal  shampoo. ? 3.  After you shampoo, rinse your hair and body thoroughly to remove the  shampoo.                      ?      4.  Use CHG as you would any other liquid soap.  You can apply chg directly  to the skin and wash  ?                     Gently with a scrungie or clean washcloth. ? 5.  Apply the CHG Soap to your body ONLY FROM THE NECK DOWN.   Do not use on face/ open      ?                     Wound or open sores. Avoid contact with eyes, ears mouth and genitals (private parts).  ?                     Production manager,  Genitals (private parts) with your normal soap. ?            6.  Wash thoroughly, paying special attention to the area where your surgery  will be performed. ? 7.  Thoroughly rinse your body with warm water from the neck down. ? 8.  DO NOT shower/wash with your normal soap after using and rinsing off  the CHG Soap. ?               9.  Pat yourself dry with a clean towel. ?           10.  Wear clean pajamas. ?           11.  Place clean sheets on your bed the night of your first shower and do not  sleep with pets. ?Day of Surgery : ?Do not apply any lotions/deodorants the morning of surgery.  Please wear clean clothes to the hospital/surgery center. ? ?FAILURE TO FOLLOW THESE INSTRUCTIONS MAY RESULT IN THE CANCELLATION OF YOUR SURGERY ? ? ?________________________________________________________________________  ? ?Incentive Spirometer ? ?An incentive spirometer is a tool that can help keep your lungs clear and active. This tool measures how well you are filling your lungs with each breath. Taking long deep breaths may help reverse or decrease the chance of developing breathing (pulmonary) problems (especially infection) following: ?A long period of time when you are unable to move or be active. ?BEFORE THE PROCEDURE  ?If the spirometer  includes an indicator to show your best effort, your  nurse or respiratory therapist will set it to a desired goal. ?If possible, sit up straight or lean slightly forward. Try not to slouch. ?Hold the incentive spirometer in an upright position. ?INSTRUCTIONS FO

## 2021-05-20 ENCOUNTER — Encounter (HOSPITAL_COMMUNITY)
Admission: RE | Admit: 2021-05-20 | Discharge: 2021-05-20 | Disposition: A | Discharge status: Home / Self Care | Payer: Medicare Other | Source: Ambulatory Visit | Attending: Orthopedic Surgery | Admitting: Orthopedic Surgery

## 2021-05-20 ENCOUNTER — Other Ambulatory Visit: Payer: Self-pay

## 2021-05-20 ENCOUNTER — Encounter (HOSPITAL_COMMUNITY): Payer: Self-pay

## 2021-05-20 VITALS — BP 152/83 | HR 67 | Temp 97.7°F | Resp 18 | Ht 65.0 in | Wt 91.0 lb

## 2021-05-20 DIAGNOSIS — R0789 Other chest pain: Secondary | ICD-10-CM | POA: Diagnosis not present

## 2021-05-20 DIAGNOSIS — Z01818 Encounter for other preprocedural examination: Secondary | ICD-10-CM | POA: Diagnosis not present

## 2021-05-20 LAB — SURGICAL PCR SCREEN
MRSA, PCR: NEGATIVE
Staphylococcus aureus: NEGATIVE

## 2021-05-20 NOTE — Progress Notes (Signed)
Anesthesia note:  Bowel prep reminder:NA  PCP - Dr. Lenice Llamas Cardiologist - Other-   Chest x-ray - no EKG - 05/20/21-chart Stress Test - NA ECHO - NA Cardiac Cath - NA  Pacemaker/ICD device last checked:NA  Sleep Study - no CPAP -   Pt is pre diabetic-NA Fasting Blood Sugar -  Checks Blood Sugar _____  Blood Thinner:NA Blood Thinner Instructions: Aspirin Instructions: Last Dose:  Anesthesia review: yes  Patient denies shortness of breath, fever, cough and chest pain at PAT appointment Pt has MS and uses a cane. Her BMI is 15.1 due to stomach CA.  Patient verbalized understanding of instructions that were given to them at the PAT appointment. Patient was also instructed that they will need to review over the PAT instructions again at home before surgery. yes

## 2021-05-22 NOTE — Patient Instructions (Addendum)
Patient is cleared for surgery.  Bone density study ordered for Fall 2023.  Labs reviewed.  EKG reviewed.  Continue follow-up with Dr. Marin Olp.  She is due for follow-up here in July.  Refilled Xanax for anxiety.

## 2021-05-25 ENCOUNTER — Inpatient Hospital Stay: Payer: Medicare Other | Attending: Hematology & Oncology

## 2021-05-25 ENCOUNTER — Other Ambulatory Visit: Payer: Self-pay

## 2021-05-25 ENCOUNTER — Inpatient Hospital Stay: Payer: Medicare Other

## 2021-05-25 VITALS — BP 125/54 | HR 60 | Temp 98.3°F | Resp 17

## 2021-05-25 DIAGNOSIS — D708 Other neutropenia: Secondary | ICD-10-CM

## 2021-05-25 DIAGNOSIS — D002 Carcinoma in situ of stomach: Secondary | ICD-10-CM

## 2021-05-25 DIAGNOSIS — D72819 Decreased white blood cell count, unspecified: Secondary | ICD-10-CM | POA: Insufficient documentation

## 2021-05-25 DIAGNOSIS — Z85028 Personal history of other malignant neoplasm of stomach: Secondary | ICD-10-CM | POA: Diagnosis not present

## 2021-05-25 LAB — CBC WITH DIFFERENTIAL (CANCER CENTER ONLY)
Abs Immature Granulocytes: 0.01 10*3/uL (ref 0.00–0.07)
Basophils Absolute: 0 10*3/uL (ref 0.0–0.1)
Basophils Relative: 1 %
Eosinophils Absolute: 0.1 10*3/uL (ref 0.0–0.5)
Eosinophils Relative: 3 %
HCT: 38.5 % (ref 36.0–46.0)
Hemoglobin: 12.4 g/dL (ref 12.0–15.0)
Immature Granulocytes: 0 %
Lymphocytes Relative: 36 %
Lymphs Abs: 1.2 10*3/uL (ref 0.7–4.0)
MCH: 30.3 pg (ref 26.0–34.0)
MCHC: 32.2 g/dL (ref 30.0–36.0)
MCV: 94.1 fL (ref 80.0–100.0)
Monocytes Absolute: 0.3 10*3/uL (ref 0.1–1.0)
Monocytes Relative: 10 %
Neutro Abs: 1.6 10*3/uL — ABNORMAL LOW (ref 1.7–7.7)
Neutrophils Relative %: 50 %
Platelet Count: 155 10*3/uL (ref 150–400)
RBC: 4.09 MIL/uL (ref 3.87–5.11)
RDW: 12.3 % (ref 11.5–15.5)
WBC Count: 3.2 10*3/uL — ABNORMAL LOW (ref 4.0–10.5)
nRBC: 0 % (ref 0.0–0.2)

## 2021-05-25 LAB — CMP (CANCER CENTER ONLY)
ALT: 22 U/L (ref 0–44)
AST: 27 U/L (ref 15–41)
Albumin: 4.3 g/dL (ref 3.5–5.0)
Alkaline Phosphatase: 64 U/L (ref 38–126)
Anion gap: 4 — ABNORMAL LOW (ref 5–15)
BUN: 14 mg/dL (ref 8–23)
CO2: 29 mmol/L (ref 22–32)
Calcium: 9.5 mg/dL (ref 8.9–10.3)
Chloride: 106 mmol/L (ref 98–111)
Creatinine: 0.98 mg/dL (ref 0.44–1.00)
GFR, Estimated: >60 mL/min (ref >60)
Glucose, Bld: 98 mg/dL (ref 70–99)
Potassium: 4.8 mmol/L (ref 3.5–5.1)
Sodium: 139 mmol/L (ref 135–145)
Total Bilirubin: 0.7 mg/dL (ref 0.3–1.2)
Total Protein: 7.7 g/dL (ref 6.5–8.1)

## 2021-05-25 MED ORDER — FILGRASTIM-SNDZ 300 MCG/0.5ML IJ SOSY
300.0000 ug | PREFILLED_SYRINGE | Freq: Once | INTRAMUSCULAR | Status: AC
  Administered 2021-05-25: 300 ug via SUBCUTANEOUS
  Filled 2021-05-25: qty 0.5

## 2021-05-25 NOTE — Progress Notes (Signed)
Labs reviewed with MD, VO for pt to have Zarxio, short acting injection for WBC. Surgery on R shoulder Friday 5/26. Order entered.

## 2021-05-27 ENCOUNTER — Ambulatory Visit: Payer: Medicare Other | Admitting: Hematology & Oncology

## 2021-05-27 ENCOUNTER — Ambulatory Visit (HOSPITAL_COMMUNITY): Payer: Medicare Other

## 2021-05-27 ENCOUNTER — Other Ambulatory Visit: Payer: Self-pay

## 2021-05-27 ENCOUNTER — Encounter (HOSPITAL_COMMUNITY)
Admission: RE | Disposition: A | Discharge status: Home / Self Care | Payer: Self-pay | Source: Home / Self Care | Attending: Orthopedic Surgery

## 2021-05-27 ENCOUNTER — Encounter (HOSPITAL_COMMUNITY): Payer: Self-pay | Admitting: Orthopedic Surgery

## 2021-05-27 ENCOUNTER — Ambulatory Visit (HOSPITAL_COMMUNITY): Payer: Medicare Other | Admitting: Anesthesiology

## 2021-05-27 ENCOUNTER — Inpatient Hospital Stay: Payer: Medicare Other

## 2021-05-27 ENCOUNTER — Observation Stay (HOSPITAL_COMMUNITY)
Admission: RE | Admit: 2021-05-27 | Discharge: 2021-05-28 | Disposition: A | Discharge status: Home / Self Care | Payer: Medicare Other | Attending: Orthopedic Surgery | Admitting: Orthopedic Surgery

## 2021-05-27 ENCOUNTER — Ambulatory Visit (HOSPITAL_BASED_OUTPATIENT_CLINIC_OR_DEPARTMENT_OTHER): Payer: Medicare Other | Admitting: Anesthesiology

## 2021-05-27 DIAGNOSIS — M75101 Unspecified rotator cuff tear or rupture of right shoulder, not specified as traumatic: Secondary | ICD-10-CM | POA: Diagnosis not present

## 2021-05-27 DIAGNOSIS — G8918 Other acute postprocedural pain: Secondary | ICD-10-CM | POA: Diagnosis not present

## 2021-05-27 DIAGNOSIS — Z96641 Presence of right artificial hip joint: Secondary | ICD-10-CM | POA: Diagnosis not present

## 2021-05-27 DIAGNOSIS — Z79899 Other long term (current) drug therapy: Secondary | ICD-10-CM | POA: Insufficient documentation

## 2021-05-27 DIAGNOSIS — I10 Essential (primary) hypertension: Secondary | ICD-10-CM | POA: Diagnosis not present

## 2021-05-27 DIAGNOSIS — M12811 Other specific arthropathies, not elsewhere classified, right shoulder: Secondary | ICD-10-CM | POA: Diagnosis not present

## 2021-05-27 DIAGNOSIS — M19011 Primary osteoarthritis, right shoulder: Secondary | ICD-10-CM | POA: Diagnosis not present

## 2021-05-27 DIAGNOSIS — Z85028 Personal history of other malignant neoplasm of stomach: Secondary | ICD-10-CM | POA: Insufficient documentation

## 2021-05-27 DIAGNOSIS — J45909 Unspecified asthma, uncomplicated: Secondary | ICD-10-CM | POA: Diagnosis not present

## 2021-05-27 DIAGNOSIS — M19111 Post-traumatic osteoarthritis, right shoulder: Secondary | ICD-10-CM | POA: Diagnosis not present

## 2021-05-27 DIAGNOSIS — Z96611 Presence of right artificial shoulder joint: Secondary | ICD-10-CM

## 2021-05-27 DIAGNOSIS — Z96612 Presence of left artificial shoulder joint: Secondary | ICD-10-CM | POA: Diagnosis not present

## 2021-05-27 DIAGNOSIS — R0789 Other chest pain: Secondary | ICD-10-CM

## 2021-05-27 HISTORY — PX: REVERSE SHOULDER ARTHROPLASTY: SHX5054

## 2021-05-27 SURGERY — ARTHROPLASTY, SHOULDER, TOTAL, REVERSE
Anesthesia: General | Site: Shoulder | Laterality: Right

## 2021-05-27 MED ORDER — MECLIZINE HCL 25 MG PO TABS: 25.0000 mg | ORAL_TABLET | Freq: Three times a day (TID) | ORAL | Status: DC | PRN

## 2021-05-27 MED ORDER — HYDROCODONE-ACETAMINOPHEN 5-325 MG PO TABS: 1.0000 | ORAL_TABLET | ORAL | Status: DC | PRN

## 2021-05-27 MED ORDER — ROCURONIUM BROMIDE 10 MG/ML (PF) SYRINGE
PREFILLED_SYRINGE | INTRAVENOUS | Status: DC | PRN
  Administered 2021-05-27: 70 mg via INTRAVENOUS

## 2021-05-27 MED ORDER — HYDROCODONE-ACETAMINOPHEN 5-325 MG PO TABS: 1.0000 | ORAL_TABLET | Freq: Four times a day (QID) | ORAL | 0 refills | Status: DC | PRN

## 2021-05-27 MED ORDER — METHOCARBAMOL 500 MG PO TABS
500.0000 mg | ORAL_TABLET | Freq: Four times a day (QID) | ORAL | Status: DC | PRN
  Administered 2021-05-28: 500 mg via ORAL
  Filled 2021-05-27: qty 1

## 2021-05-27 MED ORDER — ALBUTEROL SULFATE (2.5 MG/3ML) 0.083% IN NEBU: 3.0000 mL | INHALATION_SOLUTION | Freq: Four times a day (QID) | RESPIRATORY_TRACT | Status: DC | PRN

## 2021-05-27 MED ORDER — METOCLOPRAMIDE HCL 5 MG PO TABS: 5.0000 mg | ORAL_TABLET | Freq: Three times a day (TID) | ORAL | Status: DC | PRN

## 2021-05-27 MED ORDER — FENTANYL CITRATE PF 50 MCG/ML IJ SOSY
50.0000 ug | PREFILLED_SYRINGE | Freq: Once | INTRAMUSCULAR | Status: AC
  Administered 2021-05-27: 50 ug via INTRAVENOUS
  Filled 2021-05-27: qty 1

## 2021-05-27 MED ORDER — CHLORHEXIDINE GLUCONATE 0.12 % MT SOLN
15.0000 mL | Freq: Once | OROMUCOSAL | Status: AC
  Administered 2021-05-27: 15 mL via OROMUCOSAL

## 2021-05-27 MED ORDER — LIDOCAINE HCL (CARDIAC) PF 100 MG/5ML IV SOSY
PREFILLED_SYRINGE | INTRAVENOUS | Status: DC | PRN
  Administered 2021-05-27: 60 mg via INTRAVENOUS

## 2021-05-27 MED ORDER — PHENYLEPHRINE HCL-NACL 20-0.9 MG/250ML-% IV SOLN
INTRAVENOUS | Status: DC | PRN
  Administered 2021-05-27: 50 ug/min via INTRAVENOUS

## 2021-05-27 MED ORDER — CEFAZOLIN SODIUM-DEXTROSE 2-4 GM/100ML-% IV SOLN
2.0000 g | INTRAVENOUS | Status: AC
  Administered 2021-05-27: 2 g via INTRAVENOUS
  Filled 2021-05-27: qty 100

## 2021-05-27 MED ORDER — METOCLOPRAMIDE HCL 5 MG/ML IJ SOLN: 5.0000 mg | Freq: Three times a day (TID) | INTRAMUSCULAR | Status: DC | PRN

## 2021-05-27 MED ORDER — PROPOFOL 10 MG/ML IV BOLUS
INTRAVENOUS | Status: AC
  Filled 2021-05-27: qty 20

## 2021-05-27 MED ORDER — PHENYLEPHRINE HCL-NACL 20-0.9 MG/250ML-% IV SOLN
INTRAVENOUS | Status: AC
  Filled 2021-05-27: qty 250

## 2021-05-27 MED ORDER — PANTOPRAZOLE SODIUM 40 MG PO TBEC
40.0000 mg | DELAYED_RELEASE_TABLET | Freq: Every day | ORAL | Status: DC
  Administered 2021-05-28: 40 mg via ORAL
  Filled 2021-05-27: qty 1

## 2021-05-27 MED ORDER — SUGAMMADEX SODIUM 200 MG/2ML IV SOLN
INTRAVENOUS | Status: DC | PRN
  Administered 2021-05-27: 120 mg via INTRAVENOUS

## 2021-05-27 MED ORDER — ALPRAZOLAM 0.25 MG PO TABS: 0.2500 mg | ORAL_TABLET | Freq: Every evening | ORAL | Status: DC | PRN

## 2021-05-27 MED ORDER — ACETAMINOPHEN 325 MG PO TABS
325.0000 mg | ORAL_TABLET | Freq: Four times a day (QID) | ORAL | Status: DC | PRN
  Administered 2021-05-28: 650 mg via ORAL
  Filled 2021-05-27 (×2): qty 2

## 2021-05-27 MED ORDER — MENTHOL 3 MG MT LOZG: 1.0000 | LOZENGE | OROMUCOSAL | Status: DC | PRN

## 2021-05-27 MED ORDER — CEFAZOLIN SODIUM-DEXTROSE 1-4 GM/50ML-% IV SOLN
1.0000 g | Freq: Four times a day (QID) | INTRAVENOUS | Status: AC
  Administered 2021-05-27 – 2021-05-28 (×3): 1 g via INTRAVENOUS
  Filled 2021-05-27 (×3): qty 50

## 2021-05-27 MED ORDER — METHOCARBAMOL 500 MG IVPB - SIMPLE MED: 500.0000 mg | Freq: Four times a day (QID) | INTRAVENOUS | Status: DC | PRN

## 2021-05-27 MED ORDER — 0.9 % SODIUM CHLORIDE (POUR BTL) OPTIME
TOPICAL | Status: DC | PRN
  Administered 2021-05-27: 1000 mL

## 2021-05-27 MED ORDER — AMISULPRIDE (ANTIEMETIC) 5 MG/2ML IV SOLN: 10.0000 mg | Freq: Once | INTRAVENOUS | Status: DC | PRN

## 2021-05-27 MED ORDER — PROPOFOL 10 MG/ML IV BOLUS
INTRAVENOUS | Status: DC | PRN
  Administered 2021-05-27: 120 mg via INTRAVENOUS

## 2021-05-27 MED ORDER — MIDAZOLAM HCL 2 MG/2ML IJ SOLN
1.0000 mg | Freq: Once | INTRAMUSCULAR | Status: DC
  Filled 2021-05-27: qty 2

## 2021-05-27 MED ORDER — METRONIDAZOLE 0.75 % VA GEL: 1.0000 | Freq: Two times a day (BID) | VAGINAL | Status: DC

## 2021-05-27 MED ORDER — ORAL CARE MOUTH RINSE: 15.0000 mL | Freq: Once | OROMUCOSAL | Status: AC

## 2021-05-27 MED ORDER — DEXAMETHASONE SODIUM PHOSPHATE 10 MG/ML IJ SOLN
INTRAMUSCULAR | Status: DC | PRN
  Administered 2021-05-27: 10 mg via INTRAVENOUS

## 2021-05-27 MED ORDER — METHOCARBAMOL 500 MG PO TABS: 500.0000 mg | ORAL_TABLET | Freq: Three times a day (TID) | ORAL | 1 refills | Status: DC | PRN

## 2021-05-27 MED ORDER — PREGABALIN 50 MG PO CAPS
50.0000 mg | ORAL_CAPSULE | Freq: Three times a day (TID) | ORAL | Status: DC
Start: 2021-05-27 — End: 2021-05-28
  Administered 2021-05-27 – 2021-05-28 (×3): 50 mg via ORAL
  Filled 2021-05-27 (×3): qty 1

## 2021-05-27 MED ORDER — PANCRELIPASE (LIP-PROT-AMYL) 36000-114000 UNITS PO CPEP
36000.0000 [IU] | ORAL_CAPSULE | Freq: Three times a day (TID) | ORAL | Status: DC
  Filled 2021-05-27 (×4): qty 1

## 2021-05-27 MED ORDER — BUPIVACAINE-EPINEPHRINE (PF) 0.25% -1:200000 IJ SOLN
INTRAMUSCULAR | Status: DC | PRN
  Administered 2021-05-27: 7 mL

## 2021-05-27 MED ORDER — MORPHINE SULFATE (PF) 2 MG/ML IV SOLN: 0.5000 mg | INTRAVENOUS | Status: DC | PRN

## 2021-05-27 MED ORDER — STERILE WATER FOR IRRIGATION IR SOLN
Status: DC | PRN
  Administered 2021-05-27: 2000 mL

## 2021-05-27 MED ORDER — ONDANSETRON HCL 4 MG/2ML IJ SOLN
INTRAMUSCULAR | Status: AC
  Filled 2021-05-27: qty 2

## 2021-05-27 MED ORDER — OXYCODONE HCL 5 MG/5ML PO SOLN: 5.0000 mg | Freq: Once | ORAL | Status: DC | PRN

## 2021-05-27 MED ORDER — ROCURONIUM BROMIDE 10 MG/ML (PF) SYRINGE
PREFILLED_SYRINGE | INTRAVENOUS | Status: AC
  Filled 2021-05-27: qty 10

## 2021-05-27 MED ORDER — OXYCODONE HCL 5 MG PO TABS: 5.0000 mg | ORAL_TABLET | Freq: Once | ORAL | Status: DC | PRN

## 2021-05-27 MED ORDER — ONDANSETRON HCL 4 MG/2ML IJ SOLN: 4.0000 mg | Freq: Once | INTRAMUSCULAR | Status: DC | PRN

## 2021-05-27 MED ORDER — AMLODIPINE BESYLATE 2.5 MG PO TABS
2.5000 mg | ORAL_TABLET | Freq: Every day | ORAL | Status: DC
  Administered 2021-05-28: 2.5 mg via ORAL
  Filled 2021-05-27: qty 1

## 2021-05-27 MED ORDER — FENTANYL CITRATE PF 50 MCG/ML IJ SOSY: 25.0000 ug | PREFILLED_SYRINGE | INTRAMUSCULAR | Status: DC | PRN

## 2021-05-27 MED ORDER — LACTATED RINGERS IV SOLN: INTRAVENOUS | Status: DC

## 2021-05-27 MED ORDER — ONDANSETRON HCL 4 MG/2ML IJ SOLN
INTRAMUSCULAR | Status: DC | PRN
Start: 2021-05-27 — End: 2021-05-27
  Administered 2021-05-27: 4 mg via INTRAVENOUS

## 2021-05-27 MED ORDER — ONDANSETRON HCL 4 MG/2ML IJ SOLN: 4.0000 mg | Freq: Four times a day (QID) | INTRAMUSCULAR | Status: DC | PRN

## 2021-05-27 MED ORDER — ACETAMINOPHEN 500 MG PO TABS
500.0000 mg | ORAL_TABLET | Freq: Once | ORAL | Status: AC
  Administered 2021-05-27: 500 mg via ORAL
  Filled 2021-05-27: qty 1

## 2021-05-27 MED ORDER — DEXAMETHASONE SODIUM PHOSPHATE 10 MG/ML IJ SOLN
INTRAMUSCULAR | Status: AC
  Filled 2021-05-27: qty 1

## 2021-05-27 MED ORDER — BUPIVACAINE-EPINEPHRINE (PF) 0.25% -1:200000 IJ SOLN
INTRAMUSCULAR | Status: AC
  Filled 2021-05-27: qty 30

## 2021-05-27 MED ORDER — BUPIVACAINE LIPOSOME 1.3 % IJ SUSP
INTRAMUSCULAR | Status: DC | PRN
  Administered 2021-05-27: 10 mL via PERINEURAL

## 2021-05-27 MED ORDER — ONDANSETRON HCL 4 MG PO TABS: 4.0000 mg | ORAL_TABLET | Freq: Four times a day (QID) | ORAL | Status: DC | PRN

## 2021-05-27 MED ORDER — PHENOL 1.4 % MT LIQD: 1.0000 | OROMUCOSAL | Status: DC | PRN

## 2021-05-27 MED ORDER — ROSUVASTATIN CALCIUM 5 MG PO TABS: 5.0000 mg | ORAL_TABLET | ORAL | Status: DC

## 2021-05-27 MED ORDER — BUPIVACAINE HCL (PF) 0.5 % IJ SOLN
INTRAMUSCULAR | Status: DC | PRN
  Administered 2021-05-27: 15 mL via PERINEURAL

## 2021-05-27 MED ORDER — CYANOCOBALAMIN 1000 MCG/ML IJ SOLN: 1000.0000 ug | INTRAMUSCULAR | Status: DC

## 2021-05-27 MED ORDER — ONDANSETRON HCL 4 MG PO TABS: 4.0000 mg | ORAL_TABLET | Freq: Three times a day (TID) | ORAL | 1 refills | Status: DC | PRN

## 2021-05-27 MED ORDER — DOCUSATE SODIUM 100 MG PO CAPS
100.0000 mg | ORAL_CAPSULE | Freq: Two times a day (BID) | ORAL | Status: DC
  Administered 2021-05-27 – 2021-05-28 (×2): 100 mg via ORAL
  Filled 2021-05-27: qty 1

## 2021-05-27 MED ORDER — SODIUM CHLORIDE 0.9 % IR SOLN
Status: DC | PRN
  Administered 2021-05-27: 1000 mL

## 2021-05-27 MED ORDER — SODIUM CHLORIDE 0.9 % IV SOLN: INTRAVENOUS | Status: DC

## 2021-05-27 MED ORDER — LIDOCAINE HCL (PF) 2 % IJ SOLN
INTRAMUSCULAR | Status: AC
  Filled 2021-05-27: qty 5

## 2021-05-27 SURGICAL SUPPLY — 75 items
BAG COUNTER SPONGE SURGICOUNT (BAG) IMPLANT
BAG ZIPLOCK 12X15 (MISCELLANEOUS) IMPLANT
BIT DRILL 1.6MX128 (BIT) ×1 IMPLANT
BIT DRILL 170X2.5X (BIT) IMPLANT
BIT DRL 170X2.5X (BIT) ×1
BLADE SAG 18X100X1.27 (BLADE) ×2 IMPLANT
COVER BACK TABLE 60X90IN (DRAPES) ×2 IMPLANT
COVER SURGICAL LIGHT HANDLE (MISCELLANEOUS) ×2 IMPLANT
DRAPE INCISE IOBAN 66X45 STRL (DRAPES) ×2 IMPLANT
DRAPE ORTHO SPLIT 77X108 STRL (DRAPES) ×4
DRAPE SHEET LG 3/4 BI-LAMINATE (DRAPES) ×2 IMPLANT
DRAPE SURG ORHT 6 SPLT 77X108 (DRAPES) ×2 IMPLANT
DRAPE TOP 10253 STERILE (DRAPES) ×2 IMPLANT
DRAPE U-SHAPE 47X51 STRL (DRAPES) ×2 IMPLANT
DRILL 2.5 (BIT) ×2
DRSG ADAPTIC 3X8 NADH LF (GAUZE/BANDAGES/DRESSINGS) ×2 IMPLANT
DRSG PAD ABDOMINAL 8X10 ST (GAUZE/BANDAGES/DRESSINGS) ×2 IMPLANT
DURAPREP 26ML APPLICATOR (WOUND CARE) ×2 IMPLANT
ECCENTRIC EPIPHYSI MODULAR SZ1 (Trauma) IMPLANT
ELECT BLADE TIP CTD 4 INCH (ELECTRODE) ×2 IMPLANT
ELECT NDL TIP 2.8 STRL (NEEDLE) ×1 IMPLANT
ELECT NEEDLE TIP 2.8 STRL (NEEDLE) ×2 IMPLANT
ELECT REM PT RETURN 15FT ADLT (MISCELLANEOUS) ×2 IMPLANT
FACESHIELD WRAPAROUND (MASK) ×2 IMPLANT
FACESHIELD WRAPAROUND OR TEAM (MASK) ×1 IMPLANT
GAUZE SPONGE 4X4 12PLY STRL (GAUZE/BANDAGES/DRESSINGS) ×2 IMPLANT
GLENOSPHERE DELTA XTEND LAT 38 (Miscellaneous) ×1 IMPLANT
GLOVE BIOGEL PI IND STRL 7.5 (GLOVE) ×1 IMPLANT
GLOVE BIOGEL PI IND STRL 8.5 (GLOVE) ×1 IMPLANT
GLOVE BIOGEL PI INDICATOR 7.5 (GLOVE) ×1
GLOVE BIOGEL PI INDICATOR 8.5 (GLOVE) ×1
GLOVE ORTHO TXT STRL SZ7.5 (GLOVE) ×2 IMPLANT
GLOVE SURG ORTHO 8.5 STRL (GLOVE) ×2 IMPLANT
GOWN STRL REUS W/ TWL XL LVL3 (GOWN DISPOSABLE) ×2 IMPLANT
GOWN STRL REUS W/TWL XL LVL3 (GOWN DISPOSABLE) ×4
KIT BASIN OR (CUSTOM PROCEDURE TRAY) ×2 IMPLANT
KIT TURNOVER KIT A (KITS) IMPLANT
MANIFOLD NEPTUNE II (INSTRUMENTS) ×2 IMPLANT
METAGLENE DELTA EXTEND (Trauma) IMPLANT
METAGLENE DXTEND (Trauma) ×2 IMPLANT
MODULAR ECCENTRIC EPIPHYSI SZ1 (Trauma) ×2 IMPLANT
NDL MAYO CATGUT SZ4 TPR NDL (NEEDLE) ×1 IMPLANT
NEEDLE MAYO CATGUT SZ4 (NEEDLE) ×2 IMPLANT
NS IRRIG 1000ML POUR BTL (IV SOLUTION) ×2 IMPLANT
PACK SHOULDER (CUSTOM PROCEDURE TRAY) ×2 IMPLANT
PIN GUIDE 1.2 (PIN) ×1 IMPLANT
PIN GUIDE GLENOPHERE 1.5MX300M (PIN) ×1 IMPLANT
PIN METAGLENE 2.5 (PIN) ×1 IMPLANT
PROTECTOR NERVE ULNAR (MISCELLANEOUS) ×2 IMPLANT
RESTRAINT HEAD UNIVERSAL NS (MISCELLANEOUS) ×2 IMPLANT
SCREW 4.5X24MM (Screw) ×2 IMPLANT
SCREW 4.5X36MM (Screw) ×1 IMPLANT
SCREW BN 24X4.5XLCK STRL (Screw) IMPLANT
SCREW LOCK DELTA XTEND 4.5X30 (Screw) ×1 IMPLANT
SLING ARM FOAM STRAP LRG (SOFTGOODS) IMPLANT
SLING ARM FOAM STRAP MED (SOFTGOODS) ×1 IMPLANT
SLING ARM FOAM STRAP SML (SOFTGOODS) IMPLANT
SMARTMIX MINI TOWER (MISCELLANEOUS)
SPACER 38 PLUS 3 (Spacer) ×1 IMPLANT
SPIKE FLUID TRANSFER (MISCELLANEOUS) ×2 IMPLANT
SPONGE T-LAP 4X18 ~~LOC~~+RFID (SPONGE) ×2 IMPLANT
STEM HUMERAL SZ8 STANDARD (Stem) ×2 IMPLANT
STEM HUMERAL SZ8 STD (Stem) IMPLANT
STRIP CLOSURE SKIN 1/2X4 (GAUZE/BANDAGES/DRESSINGS) ×2 IMPLANT
SUCTION FRAZIER HANDLE 10FR (MISCELLANEOUS) ×2
SUCTION TUBE FRAZIER 10FR DISP (MISCELLANEOUS) ×1 IMPLANT
SUT FIBERWIRE #2 38 T-5 BLUE (SUTURE) ×10
SUT MNCRL AB 4-0 PS2 18 (SUTURE) ×2 IMPLANT
SUT VIC AB 0 CT1 36 (SUTURE) ×4 IMPLANT
SUT VIC AB 0 CT2 27 (SUTURE) ×2 IMPLANT
SUT VIC AB 2-0 CT1 27 (SUTURE) ×2
SUT VIC AB 2-0 CT1 TAPERPNT 27 (SUTURE) ×1 IMPLANT
SUTURE FIBERWR #2 38 T-5 BLUE (SUTURE) ×2 IMPLANT
TOWEL OR 17X26 10 PK STRL BLUE (TOWEL DISPOSABLE) ×2 IMPLANT
TOWER SMARTMIX MINI (MISCELLANEOUS) IMPLANT

## 2021-05-27 NOTE — Anesthesia Preprocedure Evaluation (Addendum)
Anesthesia Evaluation  Patient identified by MRN, date of birth, ID band Patient awake    Reviewed: Allergy & Precautions, NPO status , Patient's Chart, lab work & pertinent test results  History of Anesthesia Complications (+) PONV and history of anesthetic complications  Airway Mallampati: I  TM Distance: >3 FB Neck ROM: Full    Dental no notable dental hx.    Pulmonary asthma ,    Pulmonary exam normal breath sounds clear to auscultation       Cardiovascular Exercise Tolerance: Good hypertension, Pt. on medications Normal cardiovascular exam Rhythm:Regular Rate:Normal     Neuro/Psych PSYCHIATRIC DISORDERS Anxiety  Neuromuscular disease (multiple sclerosis)    GI/Hepatic Neg liver ROS, GERD  ,  Endo/Other  negative endocrine ROS  Renal/GU negative Renal ROS  negative genitourinary   Musculoskeletal  (+) Arthritis , Fibromyalgia -  Abdominal   Peds negative pediatric ROS (+)  Hematology  (+) Blood dyscrasia, anemia ,   Anesthesia Other Findings   Reproductive/Obstetrics negative OB ROS                           Anesthesia Physical Anesthesia Plan  ASA: 2  Anesthesia Plan: General   Post-op Pain Management: Tylenol PO (pre-op)*   Induction: Intravenous  PONV Risk Score and Plan: 4 or greater and Treatment may vary due to age or medical condition, Ondansetron and Dexamethasone  Airway Management Planned: Oral ETT  Additional Equipment: None  Intra-op Plan:   Post-operative Plan: Extubation in OR  Informed Consent: I have reviewed the patients History and Physical, chart, labs and discussed the procedure including the risks, benefits and alternatives for the proposed anesthesia with the patient or authorized representative who has indicated his/her understanding and acceptance.     Dental advisory given  Plan Discussed with: CRNA, Anesthesiologist and  Surgeon  Anesthesia Plan Comments: (Patient in preop at 0735. Ready to block patient at 0810. Patient still being checked in with preop RN. No IV access as of 0830. Norton Blizzard, MD  )      Anesthesia Quick Evaluation

## 2021-05-27 NOTE — Transfer of Care (Signed)
Immediate Anesthesia Transfer of Care Note  Patient: Erika Murphy  Procedure(s) Performed: REVERSE SHOULDER ARTHROPLASTY (Right: Shoulder)  Patient Location: PACU  Anesthesia Type:General and Regional  Level of Consciousness: sedated  Airway & Oxygen Therapy: Patient Spontanous Breathing and Patient connected to face mask oxygen  Post-op Assessment: Report given to RN and Post -op Vital signs reviewed and stable  Post vital signs: Reviewed and stable  Last Vitals:  Vitals Value Taken Time  BP    Temp    Pulse 68 05/27/21 1139  Resp 12 05/27/21 1139  SpO2 100 % 05/27/21 1139  Vitals shown include unvalidated device data.  Last Pain:  Vitals:   05/27/21 0856  TempSrc:   PainSc: 0-No pain         Complications: No notable events documented.

## 2021-05-27 NOTE — Anesthesia Procedure Notes (Signed)
Anesthesia Regional Block: Interscalene brachial plexus block   Pre-Anesthetic Checklist: , timeout performed,  Correct Patient, Correct Site, Correct Laterality,  Correct Procedure, Correct Position, site marked,  Risks and benefits discussed,  Surgical consent,  Pre-op evaluation,  At surgeon's request and post-op pain management  Laterality: Right  Prep: chloraprep       Needles:  Injection technique: Single-shot  Needle Type: Echogenic Stimulator Needle     Needle Length: 5cm  Needle Gauge: 22     Additional Needles:   Procedures:,,,, ultrasound used (permanent image in chart),,    Narrative:  Start time: 05/27/2021 8:50 AM End time: 05/27/2021 8:55 AM Injection made incrementally with aspirations every 5 mL.  Performed by: Personally  Anesthesiologist: Merlinda Frederick, MD  Additional Notes: Standard monitors applied. Skin prepped. Good needle visualization with ultrasound. Injection made in 5cc increments with no resistance to injection. Patient tolerated the procedure well.

## 2021-05-27 NOTE — Anesthesia Procedure Notes (Signed)
Procedure Name: Intubation Date/Time: 05/27/2021 9:57 AM Performed by: Lind Covert, CRNA Pre-anesthesia Checklist: Patient identified, Emergency Drugs available, Suction available and Patient being monitored Patient Re-evaluated:Patient Re-evaluated prior to induction Oxygen Delivery Method: Circle system utilized Preoxygenation: Pre-oxygenation with 100% oxygen Induction Type: IV induction Ventilation: Mask ventilation without difficulty Laryngoscope Size: Mac and 4 Grade View: Grade I Tube type: Oral Tube size: 7.0 mm Number of attempts: 1 Airway Equipment and Method: Stylet Placement Confirmation: ETT inserted through vocal cords under direct vision, positive ETCO2 and breath sounds checked- equal and bilateral Secured at: 23 cm Tube secured with: Tape Dental Injury: Teeth and Oropharynx as per pre-operative assessment

## 2021-05-27 NOTE — Care Plan (Signed)
Ortho Bundle Case Management Note  Patient Details  Name: Erika Murphy MRN: [id number] Date of Birth: May 20, 1950                  R Rev TSA on 05/27/21. DCP: Home with son Hedy Camara. DME: Sling and ice machine given at hospital. PT: HEP   DME Arranged:  N/A DME Agency:     HH Arranged:    [place] Agency:     Additional Comments: Please contact me with any questions of if this plan should need to change.  Marianne Sofia, RN,CCM [facility]  [phone] 05/27/2021, 12:11 PM

## 2021-05-27 NOTE — Interval H&P Note (Signed)
History and Physical Interval Note:  05/27/2021 8:55 AM  Erika Murphy  has presented today for surgery, with the diagnosis of Rotator cuff tear arthropathy.  The various methods of treatment have been discussed with the patient and family. After consideration of risks, benefits and other options for treatment, the patient has consented to  Procedure(s) with comments: REVERSE SHOULDER ARTHROPLASTY (Right) - with ISB as a surgical intervention.  The patient's history has been reviewed, patient examined, no change in status, stable for surgery.  I have reviewed the patient's chart and labs.  Questions were answered to the patient's satisfaction.     Augustin Schooling

## 2021-05-27 NOTE — Discharge Instructions (Signed)
Ice to the shoulder constantly.  Keep the incision covered and clean and dry for one week, then ok to get it wet in the shower. ° °Do exercise as instructed several times per day. ° °DO NOT reach behind your back or push up out of a chair with the operative arm. ° °Use a sling while you are up and around for comfort, may remove while seated.  Keep pillow propped behind the operative elbow. ° °Follow up with Dr Filippo Puls in two weeks in the office, call [phone] for appt °

## 2021-05-27 NOTE — Brief Op Note (Signed)
05/27/2021  11:36 AM  PATIENT:  Erika Murphy  71 y.o. female  PRE-OPERATIVE DIAGNOSIS:  Rotator cuff tear arthropathy  POST-OPERATIVE DIAGNOSIS:  Rotator cuff tear arthropathy  PROCEDURE:  Procedure(s) with comments: REVERSE SHOULDER ARTHROPLASTY (Right) - with ISB DePuy Delta Xtend with subscap repair  SURGEON:  Surgeon(s) and Role:    Netta Cedars, MD - Primary  PHYSICIAN ASSISTANT:   ASSISTANTS: Ventura Bruns, PA-C   ANESTHESIA:   regional and general  EBL:  75 mL   BLOOD ADMINISTERED:none  DRAINS: none   LOCAL MEDICATIONS USED:  MARCAINE     SPECIMEN:  No Specimen  DISPOSITION OF SPECIMEN:  N/A  COUNTS:  YES  TOURNIQUET:  * No tourniquets in log *  DICTATION: .Other Dictation: Dictation Number [id number]  PLAN OF CARE: Admit for overnight observation  PATIENT DISPOSITION:  PACU - hemodynamically stable.   Delay start of Pharmacological VTE agent (>24hrs) due to surgical blood loss or risk of bleeding: no

## 2021-05-27 NOTE — Anesthesia Postprocedure Evaluation (Signed)
Anesthesia Post Note  Patient: Erika Murphy  Procedure(s) Performed: REVERSE SHOULDER ARTHROPLASTY (Right: Shoulder)     Patient location during evaluation: PACU Anesthesia Type: General Level of consciousness: awake Pain management: pain level controlled Vital Signs Assessment: post-procedure vital signs reviewed and stable Respiratory status: spontaneous breathing and respiratory function stable Cardiovascular status: stable Postop Assessment: no apparent nausea or vomiting Anesthetic complications: no   No notable events documented.  Last Vitals:  Vitals:   05/27/21 1215 05/27/21 1230  BP: (!) 143/64 (!) 148/64  Pulse: 63 67  Resp: 17 14  Temp:    SpO2: 100% 96%    Last Pain:  Vitals:   05/27/21 1230  TempSrc:   PainSc: Asleep                 Merlinda Frederick

## 2021-05-27 NOTE — Progress Notes (Signed)
AssistedDr. Elgie Congo with right, interscalene , ultrasound guided block. Side rails up, monitors on throughout procedure. See vital signs in flow sheet. Tolerated Procedure well.

## 2021-05-27 NOTE — Plan of Care (Signed)
  Problem: Education: Goal: Knowledge of General Education information will improve Description: Including pain rating scale, medication(s)/side effects and non-pharmacologic comfort measures Outcome: Progressing   Problem: Clinical Measurements: Goal: Ability to maintain clinical measurements within normal limits will improve Outcome: Progressing   Problem: Activity: Goal: Risk for activity intolerance will decrease Outcome: Progressing   Problem: Pain Managment: Goal: General experience of comfort will improve Outcome: Progressing   Problem: Skin Integrity: Goal: Risk for impaired skin integrity will decrease Outcome: Progressing

## 2021-05-28 DIAGNOSIS — J45909 Unspecified asthma, uncomplicated: Secondary | ICD-10-CM | POA: Diagnosis not present

## 2021-05-28 DIAGNOSIS — M19011 Primary osteoarthritis, right shoulder: Secondary | ICD-10-CM | POA: Diagnosis not present

## 2021-05-28 DIAGNOSIS — I10 Essential (primary) hypertension: Secondary | ICD-10-CM | POA: Diagnosis not present

## 2021-05-28 DIAGNOSIS — Z85028 Personal history of other malignant neoplasm of stomach: Secondary | ICD-10-CM | POA: Diagnosis not present

## 2021-05-28 DIAGNOSIS — Z96612 Presence of left artificial shoulder joint: Secondary | ICD-10-CM | POA: Diagnosis not present

## 2021-05-28 DIAGNOSIS — Z79899 Other long term (current) drug therapy: Secondary | ICD-10-CM | POA: Diagnosis not present

## 2021-05-28 DIAGNOSIS — Z96641 Presence of right artificial hip joint: Secondary | ICD-10-CM | POA: Diagnosis not present

## 2021-05-28 LAB — BASIC METABOLIC PANEL
Anion gap: 5 (ref 5–15)
BUN: 16 mg/dL (ref 8–23)
CO2: 26 mmol/L (ref 22–32)
Calcium: 8.5 mg/dL — ABNORMAL LOW (ref 8.9–10.3)
Chloride: 108 mmol/L (ref 98–111)
Creatinine, Ser: 0.92 mg/dL (ref 0.44–1.00)
GFR, Estimated: >60 mL/min (ref >60)
Glucose, Bld: 114 mg/dL — ABNORMAL HIGH (ref 70–99)
Potassium: 4.1 mmol/L (ref 3.5–5.1)
Sodium: 139 mmol/L (ref 135–145)

## 2021-05-28 LAB — HEMOGLOBIN AND HEMATOCRIT, BLOOD
HCT: 28.3 % — ABNORMAL LOW (ref 36.0–46.0)
Hemoglobin: 9 g/dL — ABNORMAL LOW (ref 12.0–15.0)

## 2021-05-28 NOTE — Progress Notes (Signed)
Subjective: 1 Day Post-Op Procedure(s) (LRB): REVERSE SHOULDER ARTHROPLASTY (Right)  Patient reports pain as mild.    Objective:   VITALS:  Temp:  [97.4 F (36.3 C)-98.4 F (36.9 C)] 98.4 F (36.9 C) (05/27 0522) Pulse Rate:  [63-84] 77 (05/27 0930) Resp:  [12-20] 18 (05/27 0930) BP: (111-149)/(52-74) 116/52 (05/27 0930) SpO2:  [94 %-100 %] 99 % (05/27 0930)  Neurovascular intact Sensation intact distally Intact pulses distally Incision: no drainage Compartment soft   LABS Recent Labs    05/25/21 1101 05/28/21 0301  HGB 12.4 9.0*  WBC 3.2*  --   PLT 155  --    Recent Labs    05/25/21 1101 05/28/21 0301  NA 139 139  K 4.8 4.1  CL 106 108  CO2 29 26  BUN 14 16  CREATININE 0.98 0.92  GLUCOSE 98 114*   No results for input(s): LABPT, INR in the last 72 hours.   Assessment/Plan: 1 Day Post-Op Procedure(s) (LRB): REVERSE SHOULDER ARTHROPLASTY (Right)  Advance diet Up with therapy D/C IV fluids Discharge home with home health  Armond Hang 05/28/2021, 9:32 AM

## 2021-05-28 NOTE — Evaluation (Signed)
Occupational Therapy Evaluation Patient Details Name: Erika Murphy MRN: [id number] DOB: 1950-02-16 Today's Date: 05/28/2021   History of Present Illness Patient is a 26 y ear old female who presented for end stage arthritis in R shoulder. patient underwent a right reverse shoulder replacement.  PMH: MS, neuogenic bladder, osteoporosis, fibromyalgia, osteopenia.   Clinical Impression   s/p shoulder replacement without functional use of right dominant upper extremity secondary to effects of surgery and interscalene block and shoulder precautions. Therapist provided education and instruction to patient in regards to exercises, precautions, positioning, donning upper extremity clothing and bathing while maintaining shoulder precautions, ice and edema management and donning/doffing sling. Patient verbalized understanding and demonstrated as needed.patient reported that children and other family members would be availabel to assist 24/7 at home.  Patient needed assistance to donn shirt, pants, socks and shoes and provided with instruction on compensatory strategies to perform ADLs. Patient to follow up with MD for further therapy needs.        Recommendations for follow up therapy are one component of a multi-disciplinary discharge planning process, led by the attending physician.  Recommendations may be updated based on patient status, additional functional criteria and insurance authorization.   Follow Up Recommendations  Follow physician's recommendations for discharge plan and follow up therapies    Assistance Recommended at Discharge Frequent or constant Supervision/Assistance  Patient can return home with the following A little help with walking and/or transfers;A little help with bathing/dressing/bathroom;Assistance with cooking/housework;Direct supervision/assist for financial management;Assist for transportation;Help with stairs or ramp for entrance;Direct supervision/assist for medications  management    Functional Status Assessment  Patient has had a recent decline in their functional status and demonstrates the ability to make significant improvements in function in a reasonable and predictable amount of time.  Equipment Recommendations  None recommended by OT    Recommendations for Other Services       Precautions / Restrictions Precautions Precautions: Shoulder Type of Shoulder Precautions: NO ROM shoulder, OK for elbow hand and wrist AROM Shoulder Interventions: Shoulder sling/immobilizer;At all times;Off for dressing/bathing/exercises Precaution Booklet Issued: Yes (comment) (hand out) Restrictions Weight Bearing Restrictions: Yes RUE Weight Bearing: Non weight bearing      Mobility Bed Mobility Overal bed mobility: Modified Independent             General bed mobility comments: with bed rail use    Transfers                          Balance Overall balance assessment: Mild deficits observed, not formally tested                                         ADL either performed or assessed with clinical judgement   ADL Overall ADL's : Needs assistance/impaired Eating/Feeding: Set up;Sitting   Grooming: Oral care;Set up;Standing   Upper Body Bathing: Maximal assistance;Set up;Standing Upper Body Bathing Details (indicate cue type and reason): Max A for under RUE with education on see saw movements Lower Body Bathing: Set up;Supervison/ safety;Sit to/from stand   Upper Body Dressing : Cueing for UE precautions;Sitting;Moderate assistance Upper Body Dressing Details (indicate cue type and reason): with education on RUE restrictions for UB dressing tasks. Lower Body Dressing: Minimal assistance;Sit to/from stand   Toilet Transfer: Supervision/safety;Set up Toilet Transfer Details (indicate cue type and reason): with cane with patient  slow and controlled with functional mobility. Toileting- Clothing Manipulation and  Hygiene: Supervision/safety;Sit to/from stand               Vision Baseline Vision/History: 1 Wears glasses Patient Visual Report: No change from baseline       Perception     Praxis      Pertinent Vitals/Pain Pain Assessment Pain Assessment: 0-10 Pain Score: 6  Pain Location: shoulder Pain Descriptors / Indicators: Discomfort, Guarding, Operative site guarding Pain Intervention(s): Monitored during session, Premedicated before session, Repositioned     Hand Dominance Right   Extremity/Trunk Assessment Upper Extremity Assessment Upper Extremity Assessment: RUE deficits/detail RUE Deficits / Details: shoulder restrictions with block in place. patient able to move digits AROM   Lower Extremity Assessment Lower Extremity Assessment: Overall WFL for tasks assessed   Cervical / Trunk Assessment Cervical / Trunk Assessment: Normal   Communication Communication Communication: No difficulties   Cognition Arousal/Alertness: Awake/alert Behavior During Therapy: WFL for tasks assessed/performed Overall Cognitive Status: Within Functional Limits for tasks assessed                                       General Comments       Exercises     Shoulder Instructions Shoulder Instructions Method for sponge bathing under operated UE: Patient able to independently direct caregiver ROM for elbow, wrist and digits of operated UE: Patient able to independently direct caregiver Sling wearing schedule (on at all times/off for ADL's): Patient able to independently direct caregiver Dressing change: Patient able to independently direct caregiver Positioning of UE while sleeping: Patient able to independently direct caregiver    Home Living Family/patient expects to be discharged to:: Private residence Living Arrangements: Children Available Help at Discharge: Family;Available 24 hours/day                         Home Equipment: Air cabin crew (4  wheels);Cane - single point          Prior Functioning/Environment Prior Level of Function : Independent/Modified Independent               ADLs Comments: uses cane for mobility in community, furniture walks at times for mobility in the house.        OT Problem List: Impaired UE functional use;Pain;Impaired balance (sitting and/or standing);Decreased safety awareness;Decreased knowledge of precautions;Decreased knowledge of use of DME or AE      OT Treatment/Interventions:      OT Goals(Current goals can be found in the care plan section) Acute Rehab OT Goals Patient Stated Goal: to go home today OT Goal Formulation: All assessment and education complete, DC therapy  OT Frequency:      Co-evaluation              AM-PAC OT "6 Clicks" Daily Activity     Outcome Measure Help from another person eating meals?: A Little Help from another person taking care of personal grooming?: A Little Help from another person toileting, which includes using toliet, bedpan, or urinal?: A Little Help from another person bathing (including washing, rinsing, drying)?: A Little Help from another person to put on and taking off regular upper body clothing?: A Little Help from another person to put on and taking off regular lower body clothing?: A Little 6 Click Score: 18   End of Session Equipment Utilized During Treatment: Gait belt;Other (  comment) (personal single point cane)  Activity Tolerance: Patient tolerated treatment well Patient left: in chair;with call bell/phone within reach  OT Visit Diagnosis: Unsteadiness on feet (R26.81);Pain Pain - Right/Left: Right Pain - part of body: Shoulder                Time: 7092-9574 OT Time Calculation (min): 48 min Charges:  OT General Charges $OT Visit: 1 Visit OT Evaluation $OT Eval Low Complexity: 1 Low OT Treatments $Self Care/Home Management : 23-37 mins  Jackelyn Poling OTR/L, MS Acute Rehabilitation Department Office#  [phone] Pager# [phone]   Marcellina Millin 05/28/2021, 8:35 AM

## 2021-05-28 NOTE — Op Note (Unsigned)
NAMETANICA, Erika Murphy MEDICAL RECORD NO: [id number] ACCOUNT NO: [id number] DATE OF BIRTH: [birth year] FACILITY: [facility] LOCATION: [facility] PHYSICIAN: Doran Heater. Veverly Fells, MD  Operative Report   DATE OF PROCEDURE: 05/27/2021   PREOPERATIVE DIAGNOSIS:  Right shoulder end-stage arthritis with rotator cuff insufficiency.  POSTOPERATIVE DIAGNOSIS:  Right shoulder end-stage arthritis with rotator cuff insufficiency.  PROCEDURE PERFORMED:  Right reverse shoulder replacement using DePuy Delta Xtend prosthesis with subscapularis repair.  ATTENDING SURGEON:  Doran Heater. Veverly Fells, MD  ASSISTANT:  Charletta Cousin [place], [place], who was scrubbed during the entire procedure, and necessary for satisfactory completion of surgery.  ANESTHESIA:  General anesthesia was used plus interscalene block.  ESTIMATED BLOOD LOSS:  Minimal.  FLUID REPLACEMENT:  1200 mL crystalloid.  INSTRUMENT COUNTS:  Correct.  COMPLICATIONS:  No complications.  ANTIBIOTICS:  Perioperative antibiotics were given.  INDICATIONS:  The patient is a 71 year old female with worsening right shoulder pain due to rotator cuff tear arthropathy.  The patient has failed an extended period of conservative management, presents for operative treatment to relieve pain and restore  function.  Informed consent obtained.  DESCRIPTION OF PROCEDURE:  After an adequate level of anesthesia was achieved, the patient was positioned in modified beach chair position.  Right shoulder correctly identified and sterilely prepped and draped in the usual manner.  Timeout called,  verifying correct patient, correct site. We entered the patient's shoulder using a standard deltopectoral approach, starting at the coracoid process extending down to the anterior humerus.  Dissection down through subcutaneous tissues using Bovie.  We  identified the cephalic vein and took that laterally with the deltoid, pectoralis taken medially.  Conjoined tendon identified and retracted medially.   We placed our deep retractors, we then tenodesed the biceps in situ with 0 Vicryl figure-of-eight  suture x2.  We then released the subscapularis with a subperiosteal peel placing #2 FiberWire suture in a modified Mason-Allen suture technique for repair of the tendon after the case.  We released the inferior capsule progressively externally rotating  and extending the humerus and delivering the humerus out of the wound.  The patient had pretty significant arthritis with bone-on-bone noted completely eburnated superior aspect of the humeral head.  We entered the proximal humerus with a 6 mm reamer,  reaming up to a size 8 with some endosteal cortical contact with the reamer.  We then went ahead and placed our 8 mm T handle guide and resected our humeral head at 20 degrees of retroversion with the oscillating saw.  We irrigated thoroughly.  We did  use the rongeur and removed the excess osteophytes medially around to the posterior aspect of the humeral head.  We then subluxed the humerus posteriorly, gaining good exposure to the glenoid.  We removed the capsule, the labrum, the biceps stump and  then went ahead and placed our deep retractors.  The patient had extensive arthritis with large osteophytes anterior inferiorly, posteriorly were removed with a rongeur.  We went ahead and placed our guide pin for metaglene reamer and then reamed for the  metaglene baseplate.  We did our peripheral hand reaming and then drilled out our central peg hole.  We impacted the metaglene into position.  We placed a 36 screw inferiorly and then a 24 screw superiorly and then a 30 screw anteriorly.  We were able  to get 3 good screws with great purchase.  We went ahead and locked our locking screws and then selected a 38 standard glenosphere and attached that to the  baseplate with a screwdriver.  We had good baseplate security and excellent fit with the  glenosphere inferiorly.  I did a finger sweep to make sure no soft tissue  got caught up between the baseplate and the glenosphere.  We then went ahead and went back to the humerus and reamed for the one right metaphysis.  Once we had that done, we  trialled with the 8 stem with 1 right metaphysis set on the 0 setting and placed in 20 degrees of retroversion.  We reduced the shoulder and a 38+3 poly.  We were happy with our soft tissue balancing and stability.  No gapping with inferior pole or  external rotation.  We removed all trial components, irrigated thoroughly.  We drilled holes in the lesser tuberosity and placed #2 FiberWire suture for repair of the subscap.  We then went ahead and used available bone graft from the humeral head and  impaction grafting technique with the Porocoat 8 stem and the HA coated 1 right metaphysis set on the 0 setting and impacted in 20 degrees of retroversion. With the bone graft, we had great bony security with the stem and did not have to use cement.  We  then selected the real 38+3 poly impacted down the humeral tray and then reduced the shoulder.  We had appropriate tension on the conjoined tendon.  We had good stability both with inferior pole and external rotation with no gapping. After this, we  irrigated thoroughly and repaired the subscapularis anatomically back to bone using suture through bone.  At this point this did not negatively influence range of motion or restrict the range of motion at all.  We irrigated thoroughly and then repaired  the deltopectoral interval with 0 Vicryl suture followed by 2-0 Vicryl for subcutaneous closure and 4-0 Monocryl for skin.  Steri-Strips were applied followed by sterile dressing.  The patient tolerated surgery well.     Elián.Darby D: 05/27/2021 11:43:22 am T: 05/28/2021 12:24:00 am  JOB: [id number]/ [id number]

## 2021-05-28 NOTE — Plan of Care (Signed)
  Problem: Clinical Measurements: Goal: Diagnostic test results will improve Outcome: Progressing   Problem: Clinical Measurements: Goal: Respiratory complications will improve Outcome: Progressing   Problem: Clinical Measurements: Goal: Cardiovascular complication will be avoided Outcome: Progressing   Problem: Skin Integrity: Goal: Risk for impaired skin integrity will decrease Outcome: Progressing   Problem: Pain Management: Goal: Pain level will decrease with appropriate interventions Outcome: Progressing

## 2021-05-28 NOTE — Progress Notes (Signed)
Pt stable at this time. No needs at time of d/c instructions and education. Pt waiting on family to arrive for d/c.

## 2021-05-28 NOTE — Plan of Care (Signed)
Pt to d/c home with family. Pt dressing clean, dry and intact.

## 2021-06-01 ENCOUNTER — Encounter (HOSPITAL_COMMUNITY): Payer: Self-pay | Admitting: Orthopedic Surgery

## 2021-06-01 NOTE — Discharge Summary (Signed)
In most cases prophylactic antibiotics for Dental procdeures after total joint surgery are not necessary.  Exceptions are as follows:  1. History of prior total joint infection  2. Severely immunocompromised (Organ Transplant, cancer chemotherapy, Rheumatoid biologic meds such as [place])  3. Poorly controlled diabetes (A1C &gt; 8.0, blood glucose over 200)  If you have one of these conditions, contact your surgeon for an antibiotic prescription, prior to your dental procedure. Orthopedic Discharge Summary        Physician Discharge Summary  Patient ID: Erika Murphy MRN: [id number] DOB/AGE: 08-04-1950 71 y.o.  Admit date: 05/27/2021 Discharge date: 05/28/21  Procedures:  Procedure(s) (LRB): REVERSE SHOULDER ARTHROPLASTY (Right)  Attending Physician:  Dr. Esmond Plants  Admission Diagnoses:   right shoulder cuff arthropathy  Discharge Diagnoses:  right shoulder cuff arthropathy   Past Medical History:  Diagnosis Date   Anxiety    Arthritis    "right shoulder" (10/09/2013)   Asthma    pt denies   B12 deficiency anemia    pt denies   Fibromyalgia    GERD (gastroesophageal reflux disease)    History of blood transfusion 2001   "when I had my stomach cancer"   Hypertension    IBS (irritable bowel syndrome)    Iron deficiency anemia 12/11/2014   Malabsorption of iron 12/11/2014   MS (multiple sclerosis) ([place]) dx'd ~ 1998   Neurogenic bladder    due to MS   Osteopenia    Osteoporosis    PONV (postoperative nausea and vomiting)    Stomach cancer ([place]) 2001   Thyroid nodule    right    PCP: Elby Showers, MD   Discharged Condition: good  Hospital Course:  Patient underwent the above stated procedure on 05/27/2021. Patient tolerated the procedure well and brought to the recovery room in good condition and subsequently to the floor. Patient had an uncomplicated hospital course and was stable for discharge.   Disposition: Discharge disposition: 01-Home or  Self Care      with follow up in 2 weeks    Follow-up Information     Netta Cedars, MD. Schedule an appointment as soon as possible for a visit in 2 week(s).   Specialty: Orthopedic Surgery Why: call [phone] for appt Contact information: [address] [place] [address] [phone]                 Dental Antibiotics:  In most cases prophylactic antibiotics for Dental procdeures after total joint surgery are not necessary.  Exceptions are as follows:  1. History of prior total joint infection  2. Severely immunocompromised (Organ Transplant, cancer chemotherapy, Rheumatoid biologic meds such as [place])  3. Poorly controlled diabetes (A1C &gt; 8.0, blood glucose over 200)  If you have one of these conditions, contact your surgeon for an antibiotic prescription, prior to your dental procedure.  Discharge Instructions     Discharge patient   Complete by: As directed    Discharge disposition: 01-Home or Self Care   Discharge patient date: 05/28/2021       Allergies as of 05/28/2021   No Known Allergies      Medication List     TAKE these medications    albuterol 108 (90 Base) MCG/ACT inhaler Commonly known as: VENTOLIN HFA INHALE 2 PUFFS INTO THE LUNGS EVERY 6 HOURS AS NEEDED FOR SHORTNESS OF BREATH/WHEEZING   ALPRAZolam 0.25 MG tablet Commonly known as: XANAX Take 1 tablet (0.25 mg total) by mouth at  bedtime as needed for anxiety.   amLODipine 2.5 MG tablet Commonly known as: NORVASC TAKE 1 TABLET BY MOUTH EVERY DAY   cyanocobalamin 1000 MCG/ML injection Commonly known as: (VITAMIN B-12) INJECT 1 ML (1,000 MCG TOTAL) INTO THE MUSCLE ONCE A MONTH AS DIRECTED   denosumab 60 MG/ML Sosy injection Commonly known as: PROLIA Inject 60 mg into the skin every 6 (six) months.   esomeprazole 40 MG capsule Commonly known as: NEXIUM Take 1 capsule (40 mg total) by mouth daily at 12 noon.   HYDROcodone-acetaminophen 5-325 MG  tablet Commonly known as: Norco Take 1 tablet by mouth every 6 (six) hours as needed for moderate pain.   lipase/protease/amylase 36000 UNITS Cpep capsule Commonly known as: Creon Take 2 capsules (72,000 Units total) by mouth 3 (three) times daily with meals. May also take 1 capsule (36,000 Units total) as needed (with snacks).   meclizine 25 MG tablet Commonly known as: ANTIVERT Take 1 tablet (25 mg total) by mouth 3 (three) times daily as needed for dizziness.   methocarbamol 500 MG tablet Commonly known as: ROBAXIN Take 1 tablet (500 mg total) by mouth every 8 (eight) hours as needed for muscle spasms.   metroNIDAZOLE 0.75 % vaginal gel Commonly known as: METROGEL Place 1 Applicatorful vaginally 2 (two) times daily.   MULTIVITAMIN GUMMIES WOMENS PO Take 1 tablet by mouth daily.   ondansetron 4 MG tablet Commonly known as: Zofran Take 1 tablet (4 mg total) by mouth every 8 (eight) hours as needed for nausea, vomiting or refractory nausea / vomiting.   pregabalin 50 MG capsule Commonly known as: LYRICA ONE CAPSULE 3 TIMES A DAY BY MOUTH   rosuvastatin 5 MG tablet Commonly known as: CRESTOR TAKE ONE TABLET BY MOUTH WEEKLY WITH SUPPER FOR HYPERCHOLESTEROLEMIA What changed: See the new instructions.          Signed: Ventura Bruns 06/01/2021, 7:41 AM  [facility] is now [facility] [address] [place], [place], [place] [address] Phone: [place]

## 2021-06-05 ENCOUNTER — Other Ambulatory Visit: Payer: Self-pay | Admitting: Internal Medicine

## 2021-06-08 ENCOUNTER — Other Ambulatory Visit: Payer: Self-pay

## 2021-06-08 ENCOUNTER — Telehealth: Payer: Self-pay | Admitting: Internal Medicine

## 2021-06-08 MED ORDER — ALPRAZOLAM 0.5 MG PO TABS: ORAL_TABLET | ORAL | 1 refills | Status: AC

## 2021-06-08 MED ORDER — ALPRAZOLAM 0.5 MG PO TABS: 0.2500 mg | ORAL_TABLET | Freq: Every evening | ORAL | 0 refills | Status: DC | PRN

## 2021-06-08 NOTE — Telephone Encounter (Signed)
Patient informs [place] that 0.25 mg strength of Xanax (Alprazolam) is not available at pharmacy. We will prescribe 0.5 mg and she will take one half tab up to 2 times daily as needed.  MJB,MD

## 2021-06-08 NOTE — Progress Notes (Unsigned)
Patient notified that lower dose was unavailable. She has been advised to take half a tablet. She recited the instructions for me several times.

## 2021-06-10 ENCOUNTER — Other Ambulatory Visit: Payer: Self-pay

## 2021-06-10 ENCOUNTER — Encounter: Payer: Self-pay | Admitting: Hematology & Oncology

## 2021-06-10 ENCOUNTER — Other Ambulatory Visit: Payer: Self-pay | Admitting: Lab

## 2021-06-10 ENCOUNTER — Encounter: Payer: Self-pay | Admitting: *Deleted

## 2021-06-10 ENCOUNTER — Inpatient Hospital Stay: Payer: Medicare Other | Attending: Hematology & Oncology

## 2021-06-10 ENCOUNTER — Inpatient Hospital Stay: Payer: Medicare Other | Admitting: Hematology & Oncology

## 2021-06-10 VITALS — BP 121/50 | HR 76 | Temp 98.5°F | Resp 16 | Wt 89.0 lb

## 2021-06-10 DIAGNOSIS — Z79899 Other long term (current) drug therapy: Secondary | ICD-10-CM | POA: Insufficient documentation

## 2021-06-10 DIAGNOSIS — D500 Iron deficiency anemia secondary to blood loss (chronic): Secondary | ICD-10-CM

## 2021-06-10 DIAGNOSIS — D510 Vitamin B12 deficiency anemia due to intrinsic factor deficiency: Secondary | ICD-10-CM | POA: Diagnosis not present

## 2021-06-10 DIAGNOSIS — G35 Multiple sclerosis: Secondary | ICD-10-CM | POA: Insufficient documentation

## 2021-06-10 DIAGNOSIS — D509 Iron deficiency anemia, unspecified: Secondary | ICD-10-CM | POA: Diagnosis not present

## 2021-06-10 DIAGNOSIS — K909 Intestinal malabsorption, unspecified: Secondary | ICD-10-CM

## 2021-06-10 DIAGNOSIS — D002 Carcinoma in situ of stomach: Secondary | ICD-10-CM

## 2021-06-10 DIAGNOSIS — Z85028 Personal history of other malignant neoplasm of stomach: Secondary | ICD-10-CM | POA: Insufficient documentation

## 2021-06-10 DIAGNOSIS — E538 Deficiency of other specified B group vitamins: Secondary | ICD-10-CM

## 2021-06-10 LAB — CBC WITH DIFFERENTIAL (CANCER CENTER ONLY)
Abs Immature Granulocytes: 0.02 10*3/uL (ref 0.00–0.07)
Basophils Absolute: 0 10*3/uL (ref 0.0–0.1)
Basophils Relative: 1 %
Eosinophils Absolute: 0.1 10*3/uL (ref 0.0–0.5)
Eosinophils Relative: 3 %
HCT: 32.9 % — ABNORMAL LOW (ref 36.0–46.0)
Hemoglobin: 10.5 g/dL — ABNORMAL LOW (ref 12.0–15.0)
Immature Granulocytes: 1 %
Lymphocytes Relative: 29 %
Lymphs Abs: 1 10*3/uL (ref 0.7–4.0)
MCH: 30.3 pg (ref 26.0–34.0)
MCHC: 31.9 g/dL (ref 30.0–36.0)
MCV: 95.1 fL (ref 80.0–100.0)
Monocytes Absolute: 0.3 10*3/uL (ref 0.1–1.0)
Monocytes Relative: 10 %
Neutro Abs: 2 10*3/uL (ref 1.7–7.7)
Neutrophils Relative %: 56 %
Platelet Count: 337 10*3/uL (ref 150–400)
RBC: 3.46 MIL/uL — ABNORMAL LOW (ref 3.87–5.11)
RDW: 12.6 % (ref 11.5–15.5)
WBC Count: 3.5 10*3/uL — ABNORMAL LOW (ref 4.0–10.5)
nRBC: 0 % (ref 0.0–0.2)

## 2021-06-10 LAB — CMP (CANCER CENTER ONLY)
ALT: 10 U/L (ref 0–44)
AST: 18 U/L (ref 15–41)
Albumin: 4 g/dL (ref 3.5–5.0)
Alkaline Phosphatase: 77 U/L (ref 38–126)
Anion gap: 8 (ref 5–15)
BUN: 18 mg/dL (ref 8–23)
CO2: 29 mmol/L (ref 22–32)
Calcium: 9.6 mg/dL (ref 8.9–10.3)
Chloride: 103 mmol/L (ref 98–111)
Creatinine: 1.01 mg/dL — ABNORMAL HIGH (ref 0.44–1.00)
GFR, Estimated: 59 mL/min — ABNORMAL LOW (ref >60)
Glucose, Bld: 157 mg/dL — ABNORMAL HIGH (ref 70–99)
Potassium: 4.1 mmol/L (ref 3.5–5.1)
Sodium: 140 mmol/L (ref 135–145)
Total Bilirubin: 0.8 mg/dL (ref 0.3–1.2)
Total Protein: 7.6 g/dL (ref 6.5–8.1)

## 2021-06-10 LAB — IRON AND IRON BINDING CAPACITY (CC-WL,HP ONLY)
Iron: 74 ug/dL (ref 28–170)
Saturation Ratios: 21 % (ref 10.4–31.8)
TIBC: 353 ug/dL (ref 250–450)
UIBC: 279 ug/dL (ref 148–442)

## 2021-06-10 LAB — FERRITIN: Ferritin: 118 ng/mL (ref 11–307)

## 2021-06-10 LAB — LACTATE DEHYDROGENASE: LDH: 167 U/L (ref 98–192)

## 2021-06-10 NOTE — Progress Notes (Signed)
Hematology and Oncology Follow Up Visit  Erika Murphy [id number] 1950-02-23 71 y.o. 06/10/2021   Principle Diagnosis:  Stage II (T3N0M0) adenocarcinoma of the stomach-remission Recurrent iron deficiency anemia  Pernicious anemia Multiple sclerosis Current Therapy:   IV iron as indicated -- Venofer given on 04/2019 Vitamin B-12 1 mg IM every month --done at home Prolia 60 mg IM q 6 months -- next dose in 10/2021     Interim History:  [name] is back for follow-up.  She just had shoulder surgery for her right shoulder.  This was done a week ago.  I have to give her credit for making it to see [place] today.  We did go ahead and give her a dose of Neulasta before surgery.  Her white cell count is always been on the lower side.  As such, I wanted to make sure there is no problems with infections.  She tolerated surgery quite well.  She was back to see the Orthopedist next week.  Otherwise, she is feeling okay.  She has had no problems with nausea or vomiting.  She is eating okay.  She has had no change in bowel or bladder habits.  Her last iron studies that we had on her back in January showed a ferritin of 99 with an iron saturation of 35%.  Her last vitamin B12 level back in general was 474 pg/mL  She has had no fever.  Her multiple sclerosis has not flared up.  Currently, I would say performance status is probably ECOG 2.    Medications:  Current Outpatient Medications:    albuterol (VENTOLIN HFA) 108 (90 Base) MCG/ACT inhaler, INHALE 2 PUFFS INTO THE LUNGS EVERY 6 HOURS AS NEEDED FOR SHORTNESS OF BREATH/WHEEZING, Disp: 8.5 each, Rfl: 11   ALPRAZolam (XANAX) 0.5 MG tablet, Take one half tab twice daily as needed for anxiety., Disp: 60 tablet, Rfl: 1   amLODipine (NORVASC) 2.5 MG tablet, TAKE 1 TABLET BY MOUTH EVERY DAY, Disp: 90 tablet, Rfl: 3   cyanocobalamin (,VITAMIN B-12,) 1000 MCG/ML injection, INJECT 1 ML (1,000 MCG TOTAL) INTO THE MUSCLE ONCE A MONTH AS DIRECTED, Disp: 3 mL,  Rfl: 3   denosumab (PROLIA) 60 MG/ML SOSY injection, Inject 60 mg into the skin every 6 (six) months., Disp: , Rfl:    esomeprazole (NEXIUM) 40 MG capsule, Take 1 capsule (40 mg total) by mouth daily at 12 noon., Disp: 90 capsule, Rfl: 3   HYDROcodone-acetaminophen (NORCO) 5-325 MG tablet, Take 1 tablet by mouth every 6 (six) hours as needed for moderate pain., Disp: 30 tablet, Rfl: 0   lipase/protease/amylase (CREON) 36000 UNITS CPEP capsule, Take 2 capsules (72,000 Units total) by mouth 3 (three) times daily with meals. May also take 1 capsule (36,000 Units total) as needed (with snacks). (Patient not taking: Reported on 05/17/2021), Disp: 240 capsule, Rfl: 11   meclizine (ANTIVERT) 25 MG tablet, Take 1 tablet (25 mg total) by mouth 3 (three) times daily as needed for dizziness., Disp: 30 tablet, Rfl: 0   methocarbamol (ROBAXIN) 500 MG tablet, Take 1 tablet (500 mg total) by mouth every 8 (eight) hours as needed for muscle spasms., Disp: 30 tablet, Rfl: 1   metroNIDAZOLE (METROGEL) 0.75 % vaginal gel, Place 1 Applicatorful vaginally 2 (two) times daily. (Patient not taking: Reported on 05/17/2021), Disp: 70 g, Rfl: 0   Multiple Vitamins-Minerals (MULTIVITAMIN GUMMIES WOMENS PO), Take 1 tablet by mouth daily., Disp: , Rfl:    ondansetron (ZOFRAN) 4 MG tablet, Take  1 tablet (4 mg total) by mouth every 8 (eight) hours as needed for nausea, vomiting or refractory nausea / vomiting., Disp: 30 tablet, Rfl: 1   pregabalin (LYRICA) 50 MG capsule, ONE CAPSULE 3 TIMES A DAY BY MOUTH, Disp: 90 capsule, Rfl: 3   rosuvastatin (CRESTOR) 5 MG tablet, TAKE ONE TABLET BY MOUTH WEEKLY WITH SUPPER FOR HYPERCHOLESTEROLEMIA (Patient taking differently: Take 5 mg by mouth every Thursday.), Disp: 12 tablet, Rfl: 3  Allergies: No Known Allergies  Past Medical History, Surgical history, Social history, and Family History were reviewed and updated.  Review of Systems: Review of Systems  Constitutional:  Positive for  malaise/fatigue.  HENT:  Positive for tinnitus.   Eyes:  Positive for blurred vision.  Respiratory: Negative.    Cardiovascular: Negative.   Gastrointestinal:  Positive for diarrhea.  Genitourinary: Negative.   Musculoskeletal:  Positive for myalgias.  Skin: Negative.   Neurological:  Positive for tingling and weakness.  Endo/Heme/Allergies: Negative.   Psychiatric/Behavioral: Negative.      Physical Exam:  weight is 89 lb (40.4 kg). Her oral temperature is 98.5 F (36.9 C). Her blood pressure is 121/50 (abnormal) and her pulse is 76. Her respiration is 16 and oxygen saturation is 100%.   Physical Exam Vitals reviewed.  HENT:     Head: Normocephalic and atraumatic.  Eyes:     Pupils: Pupils are equal, round, and reactive to light.  Cardiovascular:     Rate and Rhythm: Normal rate and regular rhythm.     Heart sounds: Normal heart sounds.  Pulmonary:     Effort: Pulmonary effort is normal.     Breath sounds: Normal breath sounds.  Abdominal:     General: Bowel sounds are normal.     Palpations: Abdomen is soft.  Musculoskeletal:        General: No tenderness or deformity. Normal range of motion.     Cervical back: Normal range of motion.  Lymphadenopathy:     Cervical: No cervical adenopathy.  Skin:    General: Skin is warm and dry.     Findings: No erythema or rash.  Neurological:     Mental Status: She is alert and oriented to person, place, and time.  Psychiatric:        Behavior: Behavior normal.        Thought Content: Thought content normal.        Judgment: Judgment normal.      Lab Results  Component Value Date   WBC 3.5 (L) 06/10/2021   HGB 10.5 (L) 06/10/2021   HCT 32.9 (L) 06/10/2021   MCV 95.1 06/10/2021   PLT 337 06/10/2021     Chemistry      Component Value Date/Time   NA 139 05/28/2021 0301   NA 144 06/12/2016 1151   NA 142 06/11/2015 1156   K 4.1 05/28/2021 0301   K 4.7 06/12/2016 1151   K 4.3 06/11/2015 1156   CL 108 05/28/2021 0301    CL 107 06/12/2016 1151   CO2 26 05/28/2021 0301   CO2 32 06/12/2016 1151   CO2 29 06/11/2015 1156   BUN 16 05/28/2021 0301   BUN 11 06/12/2016 1151   BUN 13.5 06/11/2015 1156   CREATININE 0.92 05/28/2021 0301   CREATININE 0.98 05/25/2021 1101   CREATININE 1.06 (H) 05/13/2021 1217   CREATININE 1.2 (H) 06/11/2015 1156      Component Value Date/Time   CALCIUM 8.5 (L) 05/28/2021 0301   CALCIUM 9.6 06/12/2016 1151  CALCIUM 9.7 06/11/2015 1156   ALKPHOS 64 05/25/2021 1101   ALKPHOS 97 (H) 06/12/2016 1151   ALKPHOS 111 06/11/2015 1156   AST 27 05/25/2021 1101   AST 30 06/11/2015 1156   ALT 22 05/25/2021 1101   ALT 26 06/12/2016 1151   ALT 23 06/11/2015 1156   BILITOT 0.7 05/25/2021 1101   BILITOT 1.03 06/11/2015 1156      Impression and Plan: Erika Murphy is a 71 year old [place] female. She had a history of stage II stomach cancer. She underwent resection. She had adjuvant therapy. This is now  21 years out from treatment. She is cured of the stomach cancer.   She is recovering from the surgery for her right shoulder.  Again, she is gone through this without any problems with infection.  I think that the Neulasta that we gave her helped.  We will plan to get her back now in about 4 or 5 months.  By then, she should be doing much better with her right shoulder.  When we see her back, we will make sure she gets her Prolia.   [name], MD 6/9/202310:01 AM

## 2021-06-15 DIAGNOSIS — Z4789 Encounter for other orthopedic aftercare: Secondary | ICD-10-CM | POA: Diagnosis not present

## 2021-07-04 DIAGNOSIS — M25511 Pain in right shoulder: Secondary | ICD-10-CM | POA: Diagnosis not present

## 2021-07-15 DIAGNOSIS — M25611 Stiffness of right shoulder, not elsewhere classified: Secondary | ICD-10-CM | POA: Diagnosis not present

## 2021-07-15 DIAGNOSIS — M25511 Pain in right shoulder: Secondary | ICD-10-CM | POA: Diagnosis not present

## 2021-07-20 DIAGNOSIS — M25511 Pain in right shoulder: Secondary | ICD-10-CM | POA: Diagnosis not present

## 2021-07-20 DIAGNOSIS — M25611 Stiffness of right shoulder, not elsewhere classified: Secondary | ICD-10-CM | POA: Diagnosis not present

## 2021-07-22 ENCOUNTER — Other Ambulatory Visit: Payer: Medicare Other

## 2021-07-22 DIAGNOSIS — E7800 Pure hypercholesterolemia, unspecified: Secondary | ICD-10-CM

## 2021-07-22 DIAGNOSIS — M25511 Pain in right shoulder: Secondary | ICD-10-CM | POA: Diagnosis not present

## 2021-07-22 DIAGNOSIS — M25611 Stiffness of right shoulder, not elsewhere classified: Secondary | ICD-10-CM | POA: Diagnosis not present

## 2021-07-22 LAB — LIPID PANEL
Cholesterol: 197 mg/dL (ref <200)
HDL: 53 mg/dL (ref >=50)
LDL Cholesterol (Calc): 118 mg/dL (calc) — ABNORMAL HIGH
Non-HDL Cholesterol (Calc): 144 mg/dL (calc) — ABNORMAL HIGH (ref <130)
Total CHOL/HDL Ratio: 3.7 (calc) (ref <5.0)
Triglycerides: 145 mg/dL (ref <150)

## 2021-07-27 DIAGNOSIS — M25611 Stiffness of right shoulder, not elsewhere classified: Secondary | ICD-10-CM | POA: Diagnosis not present

## 2021-07-27 DIAGNOSIS — M25511 Pain in right shoulder: Secondary | ICD-10-CM | POA: Diagnosis not present

## 2021-07-29 ENCOUNTER — Encounter: Payer: Self-pay | Admitting: Internal Medicine

## 2021-07-29 ENCOUNTER — Ambulatory Visit (INDEPENDENT_AMBULATORY_CARE_PROVIDER_SITE_OTHER): Payer: Medicare Other | Admitting: Internal Medicine

## 2021-07-29 VITALS — BP 128/72 | HR 71 | Temp 97.1°F | Ht 60.5 in | Wt 86.0 lb

## 2021-07-29 DIAGNOSIS — M25611 Stiffness of right shoulder, not elsewhere classified: Secondary | ICD-10-CM | POA: Diagnosis not present

## 2021-07-29 DIAGNOSIS — Z681 Body mass index (BMI) 19 or less, adult: Secondary | ICD-10-CM | POA: Diagnosis not present

## 2021-07-29 DIAGNOSIS — G35D Multiple sclerosis, unspecified: Secondary | ICD-10-CM

## 2021-07-29 DIAGNOSIS — I10 Essential (primary) hypertension: Secondary | ICD-10-CM | POA: Diagnosis not present

## 2021-07-29 DIAGNOSIS — G35 Multiple sclerosis: Secondary | ICD-10-CM

## 2021-07-29 DIAGNOSIS — Z903 Acquired absence of stomach [part of]: Secondary | ICD-10-CM | POA: Diagnosis not present

## 2021-07-29 DIAGNOSIS — E538 Deficiency of other specified B group vitamins: Secondary | ICD-10-CM

## 2021-07-29 DIAGNOSIS — R54 Age-related physical debility: Secondary | ICD-10-CM | POA: Diagnosis not present

## 2021-07-29 DIAGNOSIS — E7800 Pure hypercholesterolemia, unspecified: Secondary | ICD-10-CM

## 2021-07-29 DIAGNOSIS — M810 Age-related osteoporosis without current pathological fracture: Secondary | ICD-10-CM

## 2021-07-29 DIAGNOSIS — W19XXXA Unspecified fall, initial encounter: Secondary | ICD-10-CM | POA: Diagnosis not present

## 2021-07-29 DIAGNOSIS — M25511 Pain in right shoulder: Secondary | ICD-10-CM | POA: Diagnosis not present

## 2021-07-29 DIAGNOSIS — N319 Neuromuscular dysfunction of bladder, unspecified: Secondary | ICD-10-CM | POA: Diagnosis not present

## 2021-07-29 DIAGNOSIS — Z85028 Personal history of other malignant neoplasm of stomach: Secondary | ICD-10-CM | POA: Diagnosis not present

## 2021-07-29 DIAGNOSIS — Z862 Personal history of diseases of the blood and blood-forming organs and certain disorders involving the immune mechanism: Secondary | ICD-10-CM | POA: Diagnosis not present

## 2021-07-29 MED ORDER — ROSUVASTATIN CALCIUM 5 MG PO TABS: 5.0000 mg | ORAL_TABLET | Freq: Every day | ORAL | 3 refills | Status: DC

## 2021-07-29 NOTE — Progress Notes (Signed)
   Subjective:    Patient ID: Erika Murphy, female    DOB: 03/11/1950, 71 y.o.   MRN: [id number]  HPI  71 year old Female seen for follow up. Recent lipid panel showed elevated LDL of 118.  Total cholesterol, HDL and triglycerides are all normal.  Reviewed results with her.  Starting her on Crestor 5 mg daily.  We will follow-up at time of her health maintenance exam in February.  She fell recently in her yard and did not have her phone with her.  It was very hot outside.  Family now has secured a Life Alert device for her to wear around her neck.  She resides alone.  She continues to go to physical therapy status post right shoulder arthroplasty done in late May.  Says she is getting along fairly well.  She has a history of multiple sclerosis, iron deficiency anemia, osteoporosis, spastic gait disorder with lower distal extremity weakness and proximal weakness due to multiple sclerosis.  She ambulates with a cane.  She underwent left shoulder arthroplasty by Dr. Veverly Fells in 2011.  History of vitamin D deficiency and family member gives her B12 injections monthly.  History of total right hip arthroplasty for right femoral fracture in 2016.  Followed by Dr. Marin Olp for history of gastric cancer.  Her daughter, [name] is moving to [place], [place] in the near future.  Her brother, Hedy Camara, drove her to the office today.    Review of Systems no new complaints     Objective:   Physical Exam   BP 128/72, pulse 71, BMI 16.52, pulse ox 97% weight 86 lb.   Chest clear.  Cardiac exam: Regular rate and rhythm.  No lower extremity edema.  Affect thought and judgment are normal.      Assessment & Plan:  Status post right shoulder arthroplasty in May-continues to go to physical therapy and slowly getting better  Fall in yard at home-now has Life Alert device.  Multiple sclerosis -stable- she has cane to help with  ambulation.  Has not wanted to be on treatment at this time and does fairly  well.  Pure hypercholesterolemia-patient to take Crestor 5 mg daily with follow-up in 6 months at time of Medicare wellness visit and health maintenance exam.  Roda Shutters and weight loss-has lost 5 pounds since January.  Continue to monitor this.  Essential hypertension- stable  B12 deficiency-family gives her monthly B12 injections we will check B12 level at next visit  Plan: Patient encouraged to watch her weight.  Continue current medications and follow-up in 6 months or as needed.

## 2021-07-29 NOTE — Patient Instructions (Addendum)
Increase Crestor to 5 mg daily. Follow up in 6 months for Medicare wellness visit and health maintenance exam.  We are glad to hear you have a life alert device indication fall again.  Continue B12 injections at home monthly.  Continue current medications.  It was a pleasure to see you today.

## 2021-08-01 DIAGNOSIS — H401131 Primary open-angle glaucoma, bilateral, mild stage: Secondary | ICD-10-CM | POA: Diagnosis not present

## 2021-08-03 DIAGNOSIS — M25611 Stiffness of right shoulder, not elsewhere classified: Secondary | ICD-10-CM | POA: Diagnosis not present

## 2021-08-03 DIAGNOSIS — M25511 Pain in right shoulder: Secondary | ICD-10-CM | POA: Diagnosis not present

## 2021-08-05 DIAGNOSIS — M25611 Stiffness of right shoulder, not elsewhere classified: Secondary | ICD-10-CM | POA: Diagnosis not present

## 2021-08-05 DIAGNOSIS — M25511 Pain in right shoulder: Secondary | ICD-10-CM | POA: Diagnosis not present

## 2021-08-10 DIAGNOSIS — M25511 Pain in right shoulder: Secondary | ICD-10-CM | POA: Diagnosis not present

## 2021-08-10 DIAGNOSIS — M25611 Stiffness of right shoulder, not elsewhere classified: Secondary | ICD-10-CM | POA: Diagnosis not present

## 2021-08-16 DIAGNOSIS — Z4789 Encounter for other orthopedic aftercare: Secondary | ICD-10-CM | POA: Diagnosis not present

## 2021-08-25 ENCOUNTER — Other Ambulatory Visit: Payer: Self-pay | Admitting: Internal Medicine

## 2021-09-22 ENCOUNTER — Telehealth: Payer: Self-pay | Admitting: Internal Medicine

## 2021-09-22 NOTE — Telephone Encounter (Signed)
scheduled

## 2021-09-22 NOTE — Telephone Encounter (Signed)
This is happening every day 3-5 times a day and every night, wakes her up at night. Happens any where from a couple seconds to minute or so. It is a sharpe pain that brings her to her knees. It is starting to happening more frequent.

## 2021-09-22 NOTE — Telephone Encounter (Signed)
Erika Murphy [phone]  Elliette called to say she is having some pain in the crown of her head sometimes it is a sharp pain, it gets really bad when air hits it. She would like to come in and see you. She had hoped it would get better when she had shoulder surgery but it has not.

## 2021-09-23 ENCOUNTER — Encounter: Payer: Self-pay | Admitting: Internal Medicine

## 2021-09-23 ENCOUNTER — Ambulatory Visit (INDEPENDENT_AMBULATORY_CARE_PROVIDER_SITE_OTHER): Payer: Medicare Other | Admitting: Internal Medicine

## 2021-09-23 VITALS — BP 116/66 | HR 70 | Temp 97.9°F | Ht 60.5 in | Wt 85.0 lb

## 2021-09-23 DIAGNOSIS — M810 Age-related osteoporosis without current pathological fracture: Secondary | ICD-10-CM

## 2021-09-23 DIAGNOSIS — E7800 Pure hypercholesterolemia, unspecified: Secondary | ICD-10-CM

## 2021-09-23 DIAGNOSIS — Z85028 Personal history of other malignant neoplasm of stomach: Secondary | ICD-10-CM

## 2021-09-23 DIAGNOSIS — E538 Deficiency of other specified B group vitamins: Secondary | ICD-10-CM

## 2021-09-23 DIAGNOSIS — Z903 Acquired absence of stomach [part of]: Secondary | ICD-10-CM | POA: Diagnosis not present

## 2021-09-23 DIAGNOSIS — Z862 Personal history of diseases of the blood and blood-forming organs and certain disorders involving the immune mechanism: Secondary | ICD-10-CM

## 2021-09-23 DIAGNOSIS — G35 Multiple sclerosis: Secondary | ICD-10-CM

## 2021-09-23 DIAGNOSIS — I10 Essential (primary) hypertension: Secondary | ICD-10-CM | POA: Diagnosis not present

## 2021-09-23 DIAGNOSIS — R519 Headache, unspecified: Secondary | ICD-10-CM | POA: Diagnosis not present

## 2021-09-23 NOTE — Progress Notes (Signed)
Subjective:    Patient ID: Erika Murphy, female    DOB: March 21, 1950, 71 y.o.   MRN: [id number]  HPI 71 year old Female with history of Multiple Sclerosis diagnosed years ago by Dr. Erling Cruz seen with paresthesias of scalp. She has hx of  revers right shoulder arthroplasty by Dr. Veverly Fells May 2023 and is having chronic right shoulder pain. Says she needs additional shoulder surgery.  Patient has a history of iron deficiency anemia, osteoporosis, multiple sclerosis diagnosed in 1998.  Sometimes she falls.  History of spastic gait disorder with distal lower extremity weakness and proximal weakness.  MS presented as weakness in the right lower extremity.  She took Betaseron for a while but did not like the way it made her feel and discontinued it.  She ambulates with a cane due to gait disorder.  History of neuropathic pain involving upper and lower extremities treated with Lyrica.  Had left shoulder arthroplasty by Dr. Veverly Fells in 2011.  History of vitamin B12 deficiency due to gastrectomy which she had due to gastric cancer.  Family member gives her monthly B12 injections.  She has had some memory issues in the past.  An MRI of the brain without contrast in January 2018 was stable compared to the one in 2015.  Bone density study in 2021 showed T score of -2.6 in the forearm radius.  In 2016 she had right total hip arthroplasty for right femoral fracture.  She is followed by Dr. Marin Olp, oncologist for gastric cancer.  She had subtotal gastrectomy and partial omentectomy in 2001 for gastric carcinoma.  Social history: She is retired.  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  She is now disabled.  Does not smoke or consume alcohol.  She resides alone.  Family is supportive.  She does not drive.  Family history: 1 sister died status post leg amputation with history of diabetes.  1 sister with history of breast cancer.  Brother died with  metastatic cancer but primary not known to patient.  In June she was anemic with hemoglobin 10.5 g.  Creatinine was 1.01.  Her iron level was normal at 74.  Liver functions were normal.  She has received iron infusions in the past due to gastrectomy.     Review of Systems see above-Main issue is intermittent episodes of paresthesias top of her head.  There is a small depression left of center of her skull.  Last MRI on file was in 2021 showing few minimal periventricular and subcortical foci of nonspecific T2 hyperintensity and no change from January 2018 study.     Objective:   Physical Exam Blood sugar 116/66 pulse 70 pulse oximetry 99% weight 85 pounds BMI 16.33.  She is lost 6 pounds since January.  We have had some issues with her weight in the past.  In May she weighed 89 pounds 8 ounces.       Assessment & Plan:   Paresthesias of scalp-?  Related to multiple sclerosis.  Last MRI of the brain 2021.  Refer back to Neurology where she has been seen previously for evaluation.  Multiple sclerosis-has Lyrica 50 mg 3 times daily prescribed  Skilled depression-small and mildly tender lobe gated slightly left of center  History of gastric cancer-declared cancer free by Dr. Marin Olp.  Is on Nexium 40 mg daily.  History of iron deficiency  Osteoporosis-has been treated with Prolia  Hypertension treated with low-dose Norvasc 2.5  mg daily  Hyperlipidemia treated with low-dose Crestor  Anxiety treated with Xanax as needed  Plan: Arrange for Neurology consultation has been patient is extremely worried about paresthesia of her scalp area.

## 2021-09-23 NOTE — Patient Instructions (Signed)
Referral to Neurology to evaluate paresthesia of scalp and small skull depression left of center.  History of multiple sclerosis currently not on treatment.  Takes Lyrica.  Followed by Dr. Marin Olp for history of gastric cancer and anemia.  History of iron deficiency.  History of osteoporosis.

## 2021-09-27 DIAGNOSIS — Z4789 Encounter for other orthopedic aftercare: Secondary | ICD-10-CM | POA: Diagnosis not present

## 2021-10-03 ENCOUNTER — Telehealth: Payer: Self-pay | Admitting: Neurology

## 2021-10-03 ENCOUNTER — Encounter: Payer: Self-pay | Admitting: Hematology & Oncology

## 2021-10-03 NOTE — Telephone Encounter (Signed)
Noted, thank you

## 2021-10-03 NOTE — Telephone Encounter (Signed)
Pt's new information: [employer] Member ID# [id number] Medicare # [id number] M  Just changed insurance to [facility] on Friday '09/30/21. Have not received insurance card in the mail yet

## 2021-10-06 ENCOUNTER — Ambulatory Visit (INDEPENDENT_AMBULATORY_CARE_PROVIDER_SITE_OTHER): Payer: Medicare HMO | Admitting: Neurology

## 2021-10-06 ENCOUNTER — Encounter: Payer: Self-pay | Admitting: Hematology & Oncology

## 2021-10-06 VITALS — BP 155/68 | HR 83 | Ht 65.0 in | Wt 85.0 lb

## 2021-10-06 DIAGNOSIS — G35 Multiple sclerosis: Secondary | ICD-10-CM | POA: Diagnosis not present

## 2021-10-06 DIAGNOSIS — R519 Headache, unspecified: Secondary | ICD-10-CM | POA: Diagnosis not present

## 2021-10-06 DIAGNOSIS — R269 Unspecified abnormalities of gait and mobility: Secondary | ICD-10-CM

## 2021-10-06 NOTE — Progress Notes (Signed)
PATIENT: Erika Murphy DOB: February 16, 1950  REASON FOR VISIT: follow up HISTORY FROM: patient PRIMARY NEUROLOGIST: Krista Blue  HISTORY [name] is a 71 years old right-handed African American female, patient of Dr. Erling Cruz, followed up for multiple sclerosis.   Her primary care physician is Dr. Tommie Ard Baxley   She was diagnosed with multiple sclerosis in 1998, presented with spastic gait disorder, was treated with Betaseron since 1998, which was stopped in 2011 due to abnormal liver functional test, her abnormal liver function test did recover back to normal after stopping Betaseron.   Her multiple sclerosis symptoms have been  fairly stable over years, there was no worsening of her symptoms without taking Betaseron and baclofen, she is only taking Lyrica 50 mg 3 times a day for neuropathic pain involving bilateral upper and lower extremities.   Since July 2014, she also had intermittent left shoulder radiating pain to her left lateral arm, left hand, sometimes woke her up from sleep, intermittent, there was no significant worsening of weakness.   She also had a history of left shoulder replacement in 2011,   She has a history of gastric adenocarcinoma, status post subtotal gastrectomy, partial omentectomy in March 20001, status post chemotherapy and radiation therapy.   Most recent MRI of the brain was in June 2013, small white matter hyperintensities are unchanged from 2010. No enhancing or restricted diffusion lesions are seen. These lesions are nonspecific and could be seen with multiple sclerosis or chronic microvascular ischemia most abnormality is seen in MRI of cervical spine, most recently in 2011,   MRI scan of the cervical spine in 2011 showed prominent spondylitic changes from C4-6 with mild left sided foramnial stenosis at C4-5, C5-6 and bilateral foraminal stenosis at C6-7. Illdefined spinal cord hyperintensities at C5-6 and T2-3 likely represent remote age demyelinating plaque   She lives  alone, ambulate with a cane, no bowel bladder incontinence.   She is dong well, still lives alone in a house with couple steps, using cane to ambulate, she never drive. She is taking Lyrica 50 mg 3 times a day for her upper and lower extremity pain, which has been very helpful, taking Valium 5 mg as needed,   Over the years, her multiple sclerosis symptoms has been very stable, there is no worsening of her gait problem.   She was admitted to the hospital on October 8th 2015, complaining of worsening gait difficulty, worsening right arm, and right leg weakness, deep achy pain, this was preceded by vomitting, loose stool for few days, was treated with IV Solu-Medrol for 3 days, repeat MRI of the brain, and cervical spine in Oct 2015, showed no acute lesions.   She continued to ambulate with a cane, dragging her right leg, reported 80% improvement, receiving home physical therapy, which has been helpful, Tylenol has been helpful too   UPDATE Oct 21 2014: She fell, and the broke her right hip require hip replacement August 2016, completed physical therapy.  She has increased leg stiffiness, pain. Aleve prn helps.   UPDATE Oct 18th 2017: Today she comes in with a new complains of gradual onset memory loss, difficulty remembering conversations, forgetting peoples name,   There was no family history of dementia, she just came back from the trip visiting her twin sister at [place], she continues to complains of mild memory loss   I personally reviewed MRI of the brain in 2015, only mild supratentorium white matter gliosis, mild generalized atrophy,     I reviewed  laboratory evaluation in October 2017, normal vitamin X32, folic acid, TSH, iron panels, CBC showed mildly decreased WBC 3.2, hemoglobin of 12 point 5, she has a history of vitamin D deficiency, level was 22 in 2015, was 10 in 2016,   She had a previous history of vitamin D deficiency, but is not taking any vitamin D supplements.   UPDATE  April 19 2016: She has numbness in her right fingers, pain at her right arm, radiating pain from right neck to right shoulder, right arm, she also has right low back pain, radiating pain to right buttock, she use cane at her right hand, sometimes right finger numbness woke her up from sleep.   We have personally reviewed MRI of the brain in January 2018, few T2/FLAIR hyperintensity lesion, stable from 2015, MRI cervical spine in 2015, multilevel mild degenerative changes, chronic C5-6 lesion, stable, she also complains of right foot weakness, intermittent low back pain.   UPDATE July 16 2018: She is not on any long-term immunomodulation therapy for MS, there is no worsening of her MS symptoms, but since June 2020, without clear triggers, she began to notice radiating pain to her right hip region, radiating to right leg, increased gait abnormality, she denies significant left lower extremity symptoms, denies bowel and bladder incontinence   Update August 26, 2019 SS:  Last seen in July 2020, at that time, complaining of right leg pain.  Nerve conduction in September 2020 was normal.  Called recently reported, saw Ortho over last year for her ongoing leg pain,  had ESI.  Continues to complain of pain to the right leg mostly, but sometimes the left. The pain comes and goes, started in the right leg, from knee down, then up whole leg, in between thighs, then went to left leg, feels like needle like is constant. Will come for a few weeks then go away, for at least 1 year. No known triggers, tried to adjust cleaning routine, activity.   MRI of lumbar spine September 2020 showed no significant abnormalities  Is not currently taking Cymbalta 30 mg daily, upsets her stomach Is taking Lyrica 50 mg 3 times a day.   No numbness or weakness of arms of legs. No changes bowels of bladder. History of stomach cancer, doing well. No changes to vision. Had a vertigo spell a few days agon in the bed when turning over.   Has been off MS medication since around 2011. Has chronic weakness to the right leg, gait abnormality.  Update October 06, 2021 SS: Last seen in August 2021, seen today for earlier appointment. Last year, started having pain to crown of head at night, when air hit, caused sharp/stabbing pain. Would put covers over head, wears a bonnet. Now notices more frequently, now crown of head pain feels sharp. Feels left leg is weaker. Pain is sharp stab to crown of head, last few seconds, doesn't occur back to back. Gets fatigued easily. The left leg gives out more. Has had a few falls, uses cane. She doesn't drive. Is thin, reports history of stomach cancer, 85 lbs is about baseline. Had right shoulder replacement in July. Vision is fine. She does have sunken area to crown of head where pain is.  09/28/19 MRI brain with and without contrast demonstrating: - Few minimal periventricular and subcortical foci of nonspecific T2 hyperintensities.   - No acute findings.  No change from 01/29/2016.  09/28/19 MRI cervical spine with and without contrast demonstrating: -Subtle intrinsic spinal cord T2 hyperintensity  at C5-6 centrally.  May represent chronic demyelinating plaque.  No acute plaques seen.   -Mild degenerative spondylosis from C4-5, C5-6 and C6-7.     REVIEW OF SYSTEMS: Out of a complete 14 system review of symptoms, the patient complains only of the following symptoms, and all other reviewed systems are negative.  See HPI  ALLERGIES: No Known Allergies  HOME MEDICATIONS: Outpatient Medications Prior to Visit  Medication Sig Dispense Refill   albuterol (VENTOLIN HFA) 108 (90 Base) MCG/ACT inhaler INHALE 2 PUFFS INTO THE LUNGS EVERY 6 HOURS AS NEEDED FOR SHORTNESS OF BREATH/WHEEZING 8.5 each 11   ALPRAZolam (XANAX) 0.5 MG tablet Take one half tab twice daily as needed for anxiety. 60 tablet 1   amLODipine (NORVASC) 2.5 MG tablet TAKE 1 TABLET BY MOUTH EVERY DAY 90 tablet 3   cyanocobalamin (,VITAMIN  B-12,) 1000 MCG/ML injection INJECT 1 ML (1,000 MCG TOTAL) INTO THE MUSCLE ONCE A MONTH AS DIRECTED 3 mL 3   denosumab (PROLIA) 60 MG/ML SOSY injection Inject 60 mg into the skin every 6 (six) months.     esomeprazole (NEXIUM) 40 MG capsule Take 1 capsule (40 mg total) by mouth daily at 12 noon. 90 capsule 3   meclizine (ANTIVERT) 25 MG tablet Take 1 tablet (25 mg total) by mouth 3 (three) times daily as needed for dizziness. 30 tablet 0   methocarbamol (ROBAXIN) 500 MG tablet Take 1 tablet (500 mg total) by mouth every 8 (eight) hours as needed for muscle spasms. 30 tablet 1   Multiple Vitamins-Minerals (MULTIVITAMIN GUMMIES WOMENS PO) Take 1 tablet by mouth daily.     pregabalin (LYRICA) 50 MG capsule ONE CAPSULE 3 TIMES A DAY BY MOUTH 90 capsule 5   rosuvastatin (CRESTOR) 5 MG tablet Take 1 tablet (5 mg total) by mouth daily. 90 tablet 3   No facility-administered medications prior to visit.    PAST MEDICAL HISTORY: Past Medical History:  Diagnosis Date   Anxiety    Arthritis    "right shoulder" (10/09/2013)   Asthma    pt denies   B12 deficiency anemia    pt denies   Fibromyalgia    GERD (gastroesophageal reflux disease)    History of blood transfusion 2001   "when I had my stomach cancer"   Hypertension    IBS (irritable bowel syndrome)    Iron deficiency anemia 12/11/2014   Malabsorption of iron 12/11/2014   MS (multiple sclerosis) ([place]) dx'd ~ 1998   Neurogenic bladder    due to MS   Osteopenia    Osteoporosis    PONV (postoperative nausea and vomiting)    Stomach cancer ([place]) 2001   Thyroid nodule    right    PAST SURGICAL HISTORY: Past Surgical History:  Procedure Laterality Date   BIOPSY THYROID Right 09/02/1988   CESAREAN SECTION  1975; 1984   COLONOSCOPY WITH PROPOFOL N/A 05/04/2017   Procedure: COLONOSCOPY WITH PROPOFOL;  Surgeon: Carol Ada, MD;  Location: [facility];  Service: Endoscopy;  Laterality: N/A;   ESOPHAGOGASTRODUODENOSCOPY (EGD) WITH  PROPOFOL N/A 05/04/2017   Procedure: ESOPHAGOGASTRODUODENOSCOPY (EGD) WITH PROPOFOL;  Surgeon: Carol Ada, MD;  Location: [facility];  Service: Endoscopy;  Laterality: N/A;   OMENTECTOMY  01/03/1999   partial   PARTIAL GASTRECTOMY  01/03/1999   REVERSE SHOULDER ARTHROPLASTY Right 05/27/2021   Procedure: REVERSE SHOULDER ARTHROPLASTY;  Surgeon: Netta Cedars, MD;  Location: [facility];  Service: Orthopedics;  Laterality: Right;  with ISB   TONSILLECTOMY  ~ 1959  TOTAL HIP ARTHROPLASTY Right 08/18/2014   Procedure: TOTAL HIP ARTHROPLASTY ANTERIOR APPROACH;  Surgeon: Rod Can, MD;  Location: [facility];  Service: Orthopedics;  Laterality: Right;   TOTAL SHOULDER ARTHROPLASTY Left 01/02/2009    FAMILY HISTORY: Family History  Problem Relation Age of Onset   Heart disease Mother    Diabetes Father    Stomach cancer Brother    Multiple sclerosis Other    Breast cancer Sister        2 sisters breast cancer    Breast cancer Maternal Aunt     SOCIAL HISTORY: Social History   Socioeconomic History   Marital status: Divorced    Spouse name: Not on file   Number of children: 3    Years of education: Not on file   Highest education level: Not on file  Occupational History   Occupation: DISABLED    Employer: [employer]  Tobacco Use   Smoking status: Never   Smokeless tobacco: Never   Tobacco comments:    NEVER USED TOBACCO  Vaping Use   Vaping Use: Never used  Substance and Sexual Activity   Alcohol use: No    Alcohol/week: 0.0 standard drinks of alcohol   Drug use: No   Sexual activity: Not Currently  Other Topics Concern   Not on file  Social History Narrative   Patient is retired.    Patient is married with 3 children.   Patient drinks caffeine daily.    Social Determinants of Health   Financial Resource Strain: Not on file  Food Insecurity: Not on file  Transportation Needs: Not on file  Physical Activity: Not on file  Stress: Not on file  Social Connections:  Not on file  Intimate Partner Violence: Not on file  PHYSICAL EXAM  Vitals:   10/06/21 1008  BP: (!) 155/68  Pulse: 83  Weight: 85 lb (38.6 kg)  Height: '5\' 5"'$  (1.651 m)   Body mass index is 14.14 kg/m.  Generalized: Well developed, in no acute distress, thin elderly female Neurological examination  Mentation: Alert oriented to time, place, history taking. Follows all commands speech and language fluent Cranial nerve II-XII: Pupils were equal round reactive to light. Extraocular movements were full, visual field were full on confrontational test. Facial sensation and strength were normal.  Head turning and shoulder shrug  were normal and symmetric. Motor: 3/5 right upper extremity recent shoulder surgery limited ROM, left upper is normal, right lower 4/5, left lower 3/5  Sensory: Sensory testing is intact to soft touch on all 4 extremities. No evidence of extinction is noted.  Coordination: Cerebellar testing reveals good finger-nose-finger and heel-to-shin bilaterally.  Gait and station: Gait is wide-based, cautious, uses a cane, favors the right leg Reflexes: Deep tendon reflexes are symmetric but slightly increased  DIAGNOSTIC DATA (LABS, IMAGING, TESTING) - I reviewed patient records, labs, notes, testing and imaging myself where available.  Lab Results  Component Value Date   WBC 3.5 (L) 06/10/2021   HGB 10.5 (L) 06/10/2021   HCT 32.9 (L) 06/10/2021   MCV 95.1 06/10/2021   PLT 337 06/10/2021      Component Value Date/Time   NA 140 06/10/2021 0917   NA 144 06/12/2016 1151   NA 142 06/11/2015 1156   K 4.1 06/10/2021 0917   K 4.7 06/12/2016 1151   K 4.3 06/11/2015 1156   CL 103 06/10/2021 0917   CL 107 06/12/2016 1151   CO2 29 06/10/2021 0917   CO2 32 06/12/2016 1151  CO2 29 06/11/2015 1156   GLUCOSE 157 (H) 06/10/2021 0917   GLUCOSE 88 06/12/2016 1151   BUN 18 06/10/2021 0917   BUN 11 06/12/2016 1151   BUN 13.5 06/11/2015 1156   CREATININE 1.01 (H) 06/10/2021  0917   CREATININE 1.06 (H) 05/13/2021 1217   CREATININE 1.2 (H) 06/11/2015 1156   CALCIUM 9.6 06/10/2021 0917   CALCIUM 9.6 06/12/2016 1151   CALCIUM 9.7 06/11/2015 1156   PROT 7.6 06/10/2021 0917   PROT 7.5 06/12/2016 1151   PROT 7.5 06/11/2015 1156   ALBUMIN 4.0 06/10/2021 0917   ALBUMIN 3.8 06/12/2016 1151   ALBUMIN 3.8 06/11/2015 1156   AST 18 06/10/2021 0917   AST 30 06/11/2015 1156   ALT 10 06/10/2021 0917   ALT 26 06/12/2016 1151   ALT 23 06/11/2015 1156   ALKPHOS 77 06/10/2021 0917   ALKPHOS 97 (H) 06/12/2016 1151   ALKPHOS 111 06/11/2015 1156   BILITOT 0.8 06/10/2021 0917   BILITOT 1.03 06/11/2015 1156   GFRNONAA 59 (L) 06/10/2021 0917   GFRNONAA 73 01/16/2020 0949   GFRAA 85 01/16/2020 0949   Lab Results  Component Value Date   CHOL 197 07/22/2021   HDL 53 07/22/2021   LDLCALC 118 (H) 07/22/2021   TRIG 145 07/22/2021   CHOLHDL 3.7 07/22/2021   Lab Results  Component Value Date   HGBA1C 5.0 05/13/2021   Lab Results  Component Value Date   VITAMINB12 474 01/28/2021   Lab Results  Component Value Date   TSH 0.70 01/21/2021   ASSESSMENT AND PLAN 71 y.o. year old female   1.  Secondary progressive multiple sclerosis 2.  New onset headache to crown of head x 1 year, neuralgia type  3.  Left leg weakness -Check MRI of the brain and cervical spine with and without contrast to rule out structural abnormality, also MS stability -Check creatinine before contrast -Already on Lyrica, will to hold off on medications, may consider low-dose nortriptyline if needed -Currently not on any immunomodulating MS medication -Abnormalities mainly in cervical spine, treated with Betaseron 1998-2011 developed abnormal liver function, recovered after stopping Betaseron, has been stable for years -Schedule follow-up in 4 months with Dr. Marilynn Latino, AGNP-C, DNP 10/06/2021, 10:34 AM [facility] [address] [place], [place] [address] [phone]

## 2021-10-06 NOTE — Patient Instructions (Signed)
Check MRI of the brain and cervical spine  Will update you once imaging returns

## 2021-10-07 ENCOUNTER — Inpatient Hospital Stay: Payer: Medicare HMO | Attending: Hematology & Oncology

## 2021-10-07 ENCOUNTER — Encounter: Payer: Self-pay | Admitting: Hematology & Oncology

## 2021-10-07 ENCOUNTER — Inpatient Hospital Stay: Payer: Medicare HMO | Admitting: Hematology & Oncology

## 2021-10-07 ENCOUNTER — Inpatient Hospital Stay: Payer: Medicare HMO

## 2021-10-07 ENCOUNTER — Telehealth: Payer: Self-pay | Admitting: Neurology

## 2021-10-07 ENCOUNTER — Other Ambulatory Visit: Payer: Self-pay

## 2021-10-07 VITALS — BP 128/55 | HR 78 | Temp 98.0°F | Resp 20 | Ht 65.0 in | Wt 84.0 lb

## 2021-10-07 DIAGNOSIS — R634 Abnormal weight loss: Secondary | ICD-10-CM | POA: Diagnosis not present

## 2021-10-07 DIAGNOSIS — D509 Iron deficiency anemia, unspecified: Secondary | ICD-10-CM | POA: Diagnosis not present

## 2021-10-07 DIAGNOSIS — R519 Headache, unspecified: Secondary | ICD-10-CM | POA: Diagnosis not present

## 2021-10-07 DIAGNOSIS — G35 Multiple sclerosis: Secondary | ICD-10-CM | POA: Diagnosis not present

## 2021-10-07 DIAGNOSIS — D510 Vitamin B12 deficiency anemia due to intrinsic factor deficiency: Secondary | ICD-10-CM | POA: Diagnosis not present

## 2021-10-07 DIAGNOSIS — D002 Carcinoma in situ of stomach: Secondary | ICD-10-CM

## 2021-10-07 DIAGNOSIS — Z85028 Personal history of other malignant neoplasm of stomach: Secondary | ICD-10-CM | POA: Diagnosis not present

## 2021-10-07 DIAGNOSIS — D500 Iron deficiency anemia secondary to blood loss (chronic): Secondary | ICD-10-CM

## 2021-10-07 DIAGNOSIS — E538 Deficiency of other specified B group vitamins: Secondary | ICD-10-CM

## 2021-10-07 LAB — IRON AND IRON BINDING CAPACITY (CC-WL,HP ONLY)
Iron: 117 ug/dL (ref 28–170)
Saturation Ratios: 30 % (ref 10.4–31.8)
TIBC: 395 ug/dL (ref 250–450)
UIBC: 278 ug/dL (ref 148–442)

## 2021-10-07 LAB — CBC WITH DIFFERENTIAL (CANCER CENTER ONLY)
Abs Immature Granulocytes: 0.01 10*3/uL (ref 0.00–0.07)
Basophils Absolute: 0 10*3/uL (ref 0.0–0.1)
Basophils Relative: 1 %
Eosinophils Absolute: 0.1 10*3/uL (ref 0.0–0.5)
Eosinophils Relative: 3 %
HCT: 39.4 % (ref 36.0–46.0)
Hemoglobin: 13 g/dL (ref 12.0–15.0)
Immature Granulocytes: 0 %
Lymphocytes Relative: 49 %
Lymphs Abs: 1.1 10*3/uL (ref 0.7–4.0)
MCH: 31 pg (ref 26.0–34.0)
MCHC: 33 g/dL (ref 30.0–36.0)
MCV: 93.8 fL (ref 80.0–100.0)
Monocytes Absolute: 0.3 10*3/uL (ref 0.1–1.0)
Monocytes Relative: 11 %
Neutro Abs: 0.9 10*3/uL — ABNORMAL LOW (ref 1.7–7.7)
Neutrophils Relative %: 36 %
Platelet Count: 178 10*3/uL (ref 150–400)
RBC: 4.2 MIL/uL (ref 3.87–5.11)
RDW: 12.3 % (ref 11.5–15.5)
WBC Count: 2.4 10*3/uL — ABNORMAL LOW (ref 4.0–10.5)
nRBC: 0 % (ref 0.0–0.2)

## 2021-10-07 LAB — CMP (CANCER CENTER ONLY)
ALT: 25 U/L (ref 0–44)
AST: 34 U/L (ref 15–41)
Albumin: 4.4 g/dL (ref 3.5–5.0)
Alkaline Phosphatase: 77 U/L (ref 38–126)
Anion gap: 7 (ref 5–15)
BUN: 13 mg/dL (ref 8–23)
CO2: 30 mmol/L (ref 22–32)
Calcium: 10.3 mg/dL (ref 8.9–10.3)
Chloride: 104 mmol/L (ref 98–111)
Creatinine: 0.99 mg/dL (ref 0.44–1.00)
GFR, Estimated: >60 mL/min (ref >60)
Glucose, Bld: 104 mg/dL — ABNORMAL HIGH (ref 70–99)
Potassium: 4.1 mmol/L (ref 3.5–5.1)
Sodium: 141 mmol/L (ref 135–145)
Total Bilirubin: 1 mg/dL (ref 0.3–1.2)
Total Protein: 8 g/dL (ref 6.5–8.1)

## 2021-10-07 LAB — CREATININE, SERUM
Creatinine, Ser: 0.97 mg/dL (ref 0.57–1.00)
eGFR: 62 mL/min/{1.73_m2} (ref >59)

## 2021-10-07 LAB — RETICULOCYTES
Immature Retic Fract: 6.4 % (ref 2.3–15.9)
RBC.: 4.14 MIL/uL (ref 3.87–5.11)
Retic Count, Absolute: 50.9 10*3/uL (ref 19.0–186.0)
Retic Ct Pct: 1.2 % (ref 0.4–3.1)

## 2021-10-07 LAB — FERRITIN: Ferritin: 53 ng/mL (ref 11–307)

## 2021-10-07 LAB — VITAMIN B12: Vitamin B-12: 432 pg/mL (ref 180–914)

## 2021-10-07 NOTE — Telephone Encounter (Signed)
Erika Murphy: [id number] exp. 10/07/21-11/06/21 sent to GI [phone]

## 2021-10-07 NOTE — Addendum Note (Signed)
Addended by: Burney Gauze R on: 10/07/2021 05:11 PM   Modules accepted: Orders

## 2021-10-07 NOTE — Progress Notes (Signed)
Hematology and Oncology Follow Up Visit  Erika Murphy [id number] 06/01/1950 71 y.o. 10/07/2021   Principle Diagnosis:  Stage II (T3N0M0) adenocarcinoma of the stomach-remission Recurrent iron deficiency anemia  Pernicious anemia Multiple sclerosis Current Therapy:   IV iron as indicated -- Venofer given on 04/2019 Vitamin B-12 1 mg IM every month --done at home Prolia 60 mg IM q 6 months -- next dose in 104/2024     Interim History:  [name] is back for follow-up.  I am a little worried about Erika Murphy.  She has lost weight.  Her weight is down to 84 pounds.  Is hard to tell how much she is eating.  She is still having problems with the right shoulder surgery.  She had this back in I think June.  She has very limited range of motion of the shoulder.  She is not having any diarrhea.  There is no problems with fever.  She has had no rashes.  As far as her iron studies go, back in June, her ferritin was 118 with an iron saturation of 21%.  She also been having some headaches.  She did have a sharp pain in the head.  She does have multiple sclerosis.  She did see a Neurologist yesterday.  She is scheduled for an MRI.  She has had no nausea or vomiting.  There has been no bleeding.  Currently, I would say performance status is probably ECOG 2-3.     Medications:  Current Outpatient Medications:    albuterol (VENTOLIN HFA) 108 (90 Base) MCG/ACT inhaler, INHALE 2 PUFFS INTO THE LUNGS EVERY 6 HOURS AS NEEDED FOR SHORTNESS OF BREATH/WHEEZING, Disp: 8.5 each, Rfl: 11   ALPRAZolam (XANAX) 0.5 MG tablet, Take one half tab twice daily as needed for anxiety., Disp: 60 tablet, Rfl: 1   amLODipine (NORVASC) 2.5 MG tablet, TAKE 1 TABLET BY MOUTH EVERY DAY, Disp: 90 tablet, Rfl: 3   cyanocobalamin (,VITAMIN B-12,) 1000 MCG/ML injection, INJECT 1 ML (1,000 MCG TOTAL) INTO THE MUSCLE ONCE A MONTH AS DIRECTED, Disp: 3 mL, Rfl: 3   denosumab (PROLIA) 60 MG/ML SOSY injection, Inject 60 mg into the  skin every 6 (six) months., Disp: , Rfl:    esomeprazole (NEXIUM) 40 MG capsule, Take 1 capsule (40 mg total) by mouth daily at 12 noon., Disp: 90 capsule, Rfl: 3   meclizine (ANTIVERT) 25 MG tablet, Take 1 tablet (25 mg total) by mouth 3 (three) times daily as needed for dizziness., Disp: 30 tablet, Rfl: 0   methocarbamol (ROBAXIN) 500 MG tablet, Take 1 tablet (500 mg total) by mouth every 8 (eight) hours as needed for muscle spasms., Disp: 30 tablet, Rfl: 1   Multiple Vitamins-Minerals (MULTIVITAMIN GUMMIES WOMENS PO), Take 1 tablet by mouth daily., Disp: , Rfl:    pregabalin (LYRICA) 50 MG capsule, ONE CAPSULE 3 TIMES A DAY BY MOUTH, Disp: 90 capsule, Rfl: 5   rosuvastatin (CRESTOR) 5 MG tablet, Take 1 tablet (5 mg total) by mouth daily., Disp: 90 tablet, Rfl: 3  Allergies: No Known Allergies  Past Medical History, Surgical history, Social history, and Family History were reviewed and updated.  Review of Systems: Review of Systems  Constitutional:  Positive for malaise/fatigue.  HENT:  Positive for tinnitus.   Eyes:  Positive for blurred vision.  Respiratory: Negative.    Cardiovascular: Negative.   Gastrointestinal:  Positive for diarrhea.  Genitourinary: Negative.   Musculoskeletal:  Positive for myalgias.  Skin: Negative.   Neurological:  Positive for tingling and weakness.  Endo/Heme/Allergies: Negative.   Psychiatric/Behavioral: Negative.      Physical Exam:  height is '5\' 5"'$  (1.651 m) and weight is 84 lb (38.1 kg). Her oral temperature is 98 F (36.7 C). Her blood pressure is 128/55 (abnormal) and her pulse is 78. Her respiration is 20 and oxygen saturation is 100%.   Physical Exam Vitals reviewed.  Constitutional:      Comments: Very thin African-American female.  She does have some temporal muscle wasting.  There is no adenopathy.  There is no rash.  She has had no palpable liver or spleen.  HENT:     Head: Normocephalic and atraumatic.  Eyes:     Pupils: Pupils are  equal, round, and reactive to light.  Cardiovascular:     Rate and Rhythm: Normal rate and regular rhythm.     Heart sounds: Normal heart sounds.  Pulmonary:     Effort: Pulmonary effort is normal.     Breath sounds: Normal breath sounds.  Abdominal:     General: Bowel sounds are normal.     Palpations: Abdomen is soft.  Musculoskeletal:        General: No tenderness or deformity. Normal range of motion.     Cervical back: Normal range of motion.     Comments: She has symmetric muscle atrophy in upper and lower extremities.  She has marked limited range of motion of the right shoulder.  Lymphadenopathy:     Cervical: No cervical adenopathy.  Skin:    General: Skin is warm and dry.     Findings: No erythema or rash.  Neurological:     Mental Status: She is alert and oriented to person, place, and time.  Psychiatric:        Behavior: Behavior normal.        Thought Content: Thought content normal.        Judgment: Judgment normal.      Lab Results  Component Value Date   WBC 2.4 (L) 10/07/2021   HGB 13.0 10/07/2021   HCT 39.4 10/07/2021   MCV 93.8 10/07/2021   PLT 178 10/07/2021     Chemistry      Component Value Date/Time   NA 141 10/07/2021 0943   NA 144 06/12/2016 1151   NA 142 06/11/2015 1156   K 4.1 10/07/2021 0943   K 4.7 06/12/2016 1151   K 4.3 06/11/2015 1156   CL 104 10/07/2021 0943   CL 107 06/12/2016 1151   CO2 30 10/07/2021 0943   CO2 32 06/12/2016 1151   CO2 29 06/11/2015 1156   BUN 13 10/07/2021 0943   BUN 11 06/12/2016 1151   BUN 13.5 06/11/2015 1156   CREATININE 0.99 10/07/2021 0943   CREATININE 1.06 (H) 05/13/2021 1217   CREATININE 1.2 (H) 06/11/2015 1156      Component Value Date/Time   CALCIUM 10.3 10/07/2021 0943   CALCIUM 9.6 06/12/2016 1151   CALCIUM 9.7 06/11/2015 1156   ALKPHOS 77 10/07/2021 0943   ALKPHOS 97 (H) 06/12/2016 1151   ALKPHOS 111 06/11/2015 1156   AST 34 10/07/2021 0943   AST 30 06/11/2015 1156   ALT 25 10/07/2021  0943   ALT 26 06/12/2016 1151   ALT 23 06/11/2015 1156   BILITOT 1.0 10/07/2021 0943   BILITOT 1.03 06/11/2015 1156      Impression and Plan: Erika Murphy is a 71 year old African [place] female. She had a history of stage II stomach cancer. She underwent  resection. She had adjuvant therapy. This is now  22 years out from treatment. She is cured of the stomach cancer.   Again, this weight loss is really troublesome.  I cannot imagine that she has any count of recurrence of the stomach cancer.  I do not think we have to do any scans on her.  I do not know if the multiple sclerosis is causing problems for her.  I noted that her white cell count is on the low side.  I know we have given her Neupogen in the past.  We will get have to get her back I think in 4-6 weeks.  I am not sure what to do about the weight loss.  We could always give her some appetite stimulants.  We will be interesting to see what the neurologist finds with the MRI.  She will get her Prolia today.   [name], MD 10/6/202311:20 AM

## 2021-10-10 ENCOUNTER — Encounter: Payer: Self-pay | Admitting: *Deleted

## 2021-10-10 NOTE — Addendum Note (Signed)
Addended by: Marcial Pacas on: 10/10/2021 02:51 PM   Modules accepted: Orders

## 2021-10-10 NOTE — Progress Notes (Signed)
Chart reviewed, with her age, I would also add on ESR C-reactive protein to rule out temporal arteritis,    Orders Placed This Encounter  Procedures   MR BRAIN W WO CONTRAST   MR CERVICAL SPINE W WO CONTRAST   Creatinine, Serum   C-reactive protein   Sedimentation rate

## 2021-10-12 ENCOUNTER — Telehealth: Payer: Self-pay

## 2021-10-12 NOTE — Telephone Encounter (Signed)
Erika Cloud, NP  P [facility] Calls Please ask her to come for a few more lab testing. Thanks    I spoke with the patient and requested for her to come in for lab work. The patient was agreeable. She will be on vacation next week. She will come in on October 23rd, 2023 for labs. I spoke with Butler Denmark, NP. She advised the patient come in sooner for blood work. I contacted the patient again to see if she would be available sooner. She said she will come in tomorrow for lab work.

## 2021-10-13 ENCOUNTER — Other Ambulatory Visit: Payer: Medicare HMO

## 2021-10-13 DIAGNOSIS — G35 Multiple sclerosis: Secondary | ICD-10-CM | POA: Diagnosis not present

## 2021-10-13 DIAGNOSIS — R519 Headache, unspecified: Secondary | ICD-10-CM

## 2021-10-13 DIAGNOSIS — R269 Unspecified abnormalities of gait and mobility: Secondary | ICD-10-CM | POA: Diagnosis not present

## 2021-10-14 LAB — SEDIMENTATION RATE: Sed Rate: 12 mm/hr (ref 0–40)

## 2021-10-14 LAB — C-REACTIVE PROTEIN: CRP: <1 mg/L (ref 0–10)

## 2021-10-24 ENCOUNTER — Telehealth: Payer: Self-pay | Admitting: Neurology

## 2021-10-24 NOTE — Telephone Encounter (Signed)
Pt is calling. Said she has a MRI tomorrow. Stated she has the medication but don't know how much to take. Pt is requesting a call back from nurse.

## 2021-10-25 ENCOUNTER — Ambulatory Visit
Admission: RE | Admit: 2021-10-25 | Discharge: 2021-10-25 | Disposition: A | Discharge status: Home / Self Care | Payer: Medicare HMO | Source: Ambulatory Visit | Attending: Neurology | Admitting: Neurology

## 2021-10-25 DIAGNOSIS — G35 Multiple sclerosis: Secondary | ICD-10-CM

## 2021-10-25 MED ORDER — GADOPICLENOL 0.5 MMOL/ML IV SOLN
5.0000 mL | Freq: Once | INTRAVENOUS | Status: AC | PRN
  Administered 2021-10-25: 5 mL via INTRAVENOUS

## 2021-10-25 NOTE — Telephone Encounter (Signed)
I reviewed the pt's chart, she had xanax 0.5 mg rx'd by PCP.  I returned her call and she wanted to know what we would recommended for her up coming MRI. I advised she could take 1-2 tabs 30 mins prior to MRI scan and then 1 additional tab as needed at the time of the study. I advised she must have a driver for this study.  Pt verbalized understanding and appreciation for the call. Her MRI is scheduled for today at 930 am.

## 2021-10-27 ENCOUNTER — Encounter: Payer: Self-pay | Admitting: Hematology & Oncology

## 2021-11-14 ENCOUNTER — Ambulatory Visit: Payer: Medicare HMO

## 2021-11-15 ENCOUNTER — Ambulatory Visit (INDEPENDENT_AMBULATORY_CARE_PROVIDER_SITE_OTHER): Payer: Medicare HMO

## 2021-11-15 VITALS — BP 122/68 | Temp 98.3°F

## 2021-11-15 DIAGNOSIS — M810 Age-related osteoporosis without current pathological fracture: Secondary | ICD-10-CM | POA: Diagnosis not present

## 2021-11-15 MED ORDER — DENOSUMAB 60 MG/ML ~~LOC~~ SOSY
60.0000 mg | PREFILLED_SYRINGE | Freq: Once | SUBCUTANEOUS | Status: AC
  Administered 2021-11-15: 60 mg via SUBCUTANEOUS

## 2021-11-15 NOTE — Addendum Note (Signed)
Addended by: Geradine Girt D on: 11/15/2021 03:10 PM   Modules accepted: Level of Service

## 2021-11-18 ENCOUNTER — Inpatient Hospital Stay (HOSPITAL_BASED_OUTPATIENT_CLINIC_OR_DEPARTMENT_OTHER): Payer: Medicare HMO | Admitting: Family

## 2021-11-18 ENCOUNTER — Other Ambulatory Visit: Payer: Self-pay

## 2021-11-18 ENCOUNTER — Encounter: Payer: Self-pay | Admitting: Family

## 2021-11-18 ENCOUNTER — Inpatient Hospital Stay: Payer: Medicare HMO | Attending: Hematology & Oncology

## 2021-11-18 VITALS — BP 90/57 | HR 88 | Temp 98.3°F | Resp 18 | Ht 65.0 in | Wt 85.1 lb

## 2021-11-18 DIAGNOSIS — G35 Multiple sclerosis: Secondary | ICD-10-CM | POA: Insufficient documentation

## 2021-11-18 DIAGNOSIS — E538 Deficiency of other specified B group vitamins: Secondary | ICD-10-CM

## 2021-11-18 DIAGNOSIS — Z85028 Personal history of other malignant neoplasm of stomach: Secondary | ICD-10-CM | POA: Insufficient documentation

## 2021-11-18 DIAGNOSIS — D500 Iron deficiency anemia secondary to blood loss (chronic): Secondary | ICD-10-CM

## 2021-11-18 DIAGNOSIS — K219 Gastro-esophageal reflux disease without esophagitis: Secondary | ICD-10-CM | POA: Insufficient documentation

## 2021-11-18 DIAGNOSIS — G8929 Other chronic pain: Secondary | ICD-10-CM | POA: Insufficient documentation

## 2021-11-18 DIAGNOSIS — Z79899 Other long term (current) drug therapy: Secondary | ICD-10-CM | POA: Diagnosis not present

## 2021-11-18 DIAGNOSIS — D510 Vitamin B12 deficiency anemia due to intrinsic factor deficiency: Secondary | ICD-10-CM | POA: Diagnosis not present

## 2021-11-18 DIAGNOSIS — D509 Iron deficiency anemia, unspecified: Secondary | ICD-10-CM | POA: Diagnosis not present

## 2021-11-18 DIAGNOSIS — D002 Carcinoma in situ of stomach: Secondary | ICD-10-CM

## 2021-11-18 DIAGNOSIS — M25511 Pain in right shoulder: Secondary | ICD-10-CM | POA: Diagnosis not present

## 2021-11-18 LAB — RETICULOCYTES
Immature Retic Fract: 6.7 % (ref 2.3–15.9)
RBC.: 3.78 MIL/uL — ABNORMAL LOW (ref 3.87–5.11)
Retic Count, Absolute: 41.2 10*3/uL (ref 19.0–186.0)
Retic Ct Pct: 1.1 % (ref 0.4–3.1)

## 2021-11-18 LAB — IRON AND IRON BINDING CAPACITY (CC-WL,HP ONLY)
Iron: 98 ug/dL (ref 28–170)
Saturation Ratios: 25 % (ref 10.4–31.8)
TIBC: 396 ug/dL (ref 250–450)
UIBC: 298 ug/dL (ref 148–442)

## 2021-11-18 LAB — CBC WITH DIFFERENTIAL (CANCER CENTER ONLY)
Abs Immature Granulocytes: 0.04 10*3/uL (ref 0.00–0.07)
Basophils Absolute: 0 10*3/uL (ref 0.0–0.1)
Basophils Relative: 1 %
Eosinophils Absolute: 0.1 10*3/uL (ref 0.0–0.5)
Eosinophils Relative: 2 %
HCT: 36.2 % (ref 36.0–46.0)
Hemoglobin: 11.4 g/dL — ABNORMAL LOW (ref 12.0–15.0)
Immature Granulocytes: 1 %
Lymphocytes Relative: 50 %
Lymphs Abs: 1.9 10*3/uL (ref 0.7–4.0)
MCH: 30.3 pg (ref 26.0–34.0)
MCHC: 31.5 g/dL (ref 30.0–36.0)
MCV: 96.3 fL (ref 80.0–100.0)
Monocytes Absolute: 0.4 10*3/uL (ref 0.1–1.0)
Monocytes Relative: 11 %
Neutro Abs: 1.3 10*3/uL — ABNORMAL LOW (ref 1.7–7.7)
Neutrophils Relative %: 35 %
Platelet Count: 155 10*3/uL (ref 150–400)
RBC: 3.76 MIL/uL — ABNORMAL LOW (ref 3.87–5.11)
RDW: 12.1 % (ref 11.5–15.5)
WBC Count: 3.7 10*3/uL — ABNORMAL LOW (ref 4.0–10.5)
nRBC: 0 % (ref 0.0–0.2)

## 2021-11-18 LAB — LACTATE DEHYDROGENASE: LDH: 153 U/L (ref 98–192)

## 2021-11-18 LAB — CMP (CANCER CENTER ONLY)
ALT: 24 U/L (ref 0–44)
AST: 28 U/L (ref 15–41)
Albumin: 4.3 g/dL (ref 3.5–5.0)
Alkaline Phosphatase: 72 U/L (ref 38–126)
Anion gap: 8 (ref 5–15)
BUN: 18 mg/dL (ref 8–23)
CO2: 29 mmol/L (ref 22–32)
Calcium: 9.5 mg/dL (ref 8.9–10.3)
Chloride: 108 mmol/L (ref 98–111)
Creatinine: 1.03 mg/dL — ABNORMAL HIGH (ref 0.44–1.00)
GFR, Estimated: 57 mL/min — ABNORMAL LOW (ref >60)
Glucose, Bld: 57 mg/dL — ABNORMAL LOW (ref 70–99)
Potassium: 3.8 mmol/L (ref 3.5–5.1)
Sodium: 145 mmol/L (ref 135–145)
Total Bilirubin: 0.6 mg/dL (ref 0.3–1.2)
Total Protein: 7.7 g/dL (ref 6.5–8.1)

## 2021-11-18 LAB — FERRITIN: Ferritin: 41 ng/mL (ref 11–307)

## 2021-11-18 LAB — VITAMIN B12: Vitamin B-12: 384 pg/mL (ref 180–914)

## 2021-11-18 LAB — PREALBUMIN: Prealbumin: 20 mg/dL (ref 18–38)

## 2021-11-18 NOTE — Progress Notes (Signed)
Hematology and Oncology Follow Up Visit  Erika Murphy [id number] 01/17/1950 71 y.o. 11/18/2021   Principle Diagnosis:  Stage II (T3N0M0) adenocarcinoma of the stomach-remission Recurrent iron deficiency anemia  Pernicious anemia Multiple sclerosis  Current Therapy:        IV iron as indicated Vitamin B-12 1 mg IM every month -- done at home Prolia 60 mg IM q 6 months -- with PCP   Interim History:  Erika Murphy is here today for follow-up. She is dong fairly well. She is eating well and staying hydrated throughout the day. Her weight is stable at 85 lbs. She states that she is unable to do protein drinks due to the sugar upsetting her stomach.  She is still having the occasional sharp shooting pain in the back of her head. She states that her recent MRI or the brain showed no obvious cause. She states that she sees her neurologist again soon for follow-up and will let them know.  She has chronic pain in the right shoulder that waxes and wanes.  She has trouble sleeping due to this as well as GERD.  Minimal swelling in the hands and feet that comes and goes.  Tingling in arms and legs which she attributes to MS unchanged from baseline.  No falls or syncope reported. She ambulates with a cane for added support.  No fever, chills, n/v, cough, rash, dizziness, SOB, chest pain, palpitations, abdominal pain or changes in bowel or bladder habits.  No blood loss, bruising or petechiae.   ECOG Performance Status: 1 - Symptomatic but completely ambulatory  Medications:  Allergies as of 11/18/2021   No Known Allergies      Medication List        Accurate as of November 18, 2021 12:04 PM. If you have any questions, ask your nurse or doctor.          albuterol 108 (90 Base) MCG/ACT inhaler Commonly known as: VENTOLIN HFA INHALE 2 PUFFS INTO THE LUNGS EVERY 6 HOURS AS NEEDED FOR SHORTNESS OF BREATH/WHEEZING   ALPRAZolam 0.5 MG tablet Commonly known as: Xanax Take one half tab  twice daily as needed for anxiety.   amLODipine 2.5 MG tablet Commonly known as: NORVASC TAKE 1 TABLET BY MOUTH EVERY DAY   cyanocobalamin 1000 MCG/ML injection Commonly known as: VITAMIN B12 INJECT 1 ML (1,000 MCG TOTAL) INTO THE MUSCLE ONCE A MONTH AS DIRECTED   denosumab 60 MG/ML Sosy injection Commonly known as: PROLIA Inject 60 mg into the skin every 6 (six) months.   esomeprazole 40 MG capsule Commonly known as: NEXIUM Take 1 capsule (40 mg total) by mouth daily at 12 noon.   meclizine 25 MG tablet Commonly known as: ANTIVERT Take 1 tablet (25 mg total) by mouth 3 (three) times daily as needed for dizziness.   methocarbamol 500 MG tablet Commonly known as: ROBAXIN Take 1 tablet (500 mg total) by mouth every 8 (eight) hours as needed for muscle spasms.   MULTIVITAMIN GUMMIES WOMENS PO Take 1 tablet by mouth daily.   pregabalin 50 MG capsule Commonly known as: LYRICA ONE CAPSULE 3 TIMES A DAY BY MOUTH   rosuvastatin 5 MG tablet Commonly known as: Crestor Take 1 tablet (5 mg total) by mouth daily.        Allergies: No Known Allergies  Past Medical History, Surgical history, Social history, and Family History were reviewed and updated.  Review of Systems: All other 10 point review of systems is negative.   Physical  Exam:  height is '5\' 5"'$  (1.651 m) and weight is 85 lb 1.3 oz (38.6 kg). Her oral temperature is 98.3 F (36.8 C). Her blood pressure is 90/57 (abnormal) and her pulse is 88. Her respiration is 18 and oxygen saturation is 100%.   Wt Readings from Last 3 Encounters:  11/18/21 85 lb 1.3 oz (38.6 kg)  10/07/21 84 lb (38.1 kg)  10/06/21 85 lb (38.6 kg)    Ocular: Sclerae unicteric, pupils equal, round and reactive to light Ear-nose-throat: Oropharynx clear, dentition fair Lymphatic: No cervical or supraclavicular adenopathy Lungs no rales or rhonchi, good excursion bilaterally Heart regular rate and rhythm, no murmur appreciated Abd soft,  nontender, positive bowel sounds, no liver or spleen palpated on exam, no fluid wave  MSK no focal spinal tenderness, no joint edema, She has muscle atrophy noted in both upper and lower extremities  Neuro: non-focal, well-oriented, appropriate affect Breasts:Deferred    Lab Results  Component Value Date   WBC 3.7 (L) 11/18/2021   HGB 11.4 (L) 11/18/2021   HCT 36.2 11/18/2021   MCV 96.3 11/18/2021   PLT 155 11/18/2021   Lab Results  Component Value Date   FERRITIN 53 10/07/2021   IRON 117 10/07/2021   TIBC 395 10/07/2021   UIBC 278 10/07/2021   IRONPCTSAT 30 10/07/2021   Lab Results  Component Value Date   RETICCTPCT 1.1 11/18/2021   RBC 3.78 (L) 11/18/2021   RBC 3.76 (L) 11/18/2021   RETICCTABS 45.7 06/12/2014   No results found for: "KPAFRELGTCHN", "LAMBDASER", "KAPLAMBRATIO" No results found for: "IGGSERUM", "IGA", "IGMSERUM" No results found for: "TOTALPROTELP", "ALBUMINELP", "A1GS", "A2GS", "BETS", "BETA2SER", "GAMS", "MSPIKE", "SPEI"   Chemistry      Component Value Date/Time   NA 141 10/07/2021 0943   NA 144 06/12/2016 1151   NA 142 06/11/2015 1156   K 4.1 10/07/2021 0943   K 4.7 06/12/2016 1151   K 4.3 06/11/2015 1156   CL 104 10/07/2021 0943   CL 107 06/12/2016 1151   CO2 30 10/07/2021 0943   CO2 32 06/12/2016 1151   CO2 29 06/11/2015 1156   BUN 13 10/07/2021 0943   BUN 11 06/12/2016 1151   BUN 13.5 06/11/2015 1156   CREATININE 0.99 10/07/2021 0943   CREATININE 1.06 (H) 05/13/2021 1217   CREATININE 1.2 (H) 06/11/2015 1156      Component Value Date/Time   CALCIUM 10.3 10/07/2021 0943   CALCIUM 9.6 06/12/2016 1151   CALCIUM 9.7 06/11/2015 1156   ALKPHOS 77 10/07/2021 0943   ALKPHOS 97 (H) 06/12/2016 1151   ALKPHOS 111 06/11/2015 1156   AST 34 10/07/2021 0943   AST 30 06/11/2015 1156   ALT 25 10/07/2021 0943   ALT 26 06/12/2016 1151   ALT 23 06/11/2015 1156   BILITOT 1.0 10/07/2021 0943   BILITOT 1.03 06/11/2015 1156       Impression and  Plan: Erika Murphy is a very pleasant 71 yo African American female with remote history of stage II stomach cancer which was resected over 20 years ago.  No Neupogen needed this visit, WBC count is 3.7.  Iron studies are pending.  Follow-up in 8 weeks.   Erika Dawson, NP 11/17/202312:04 PM

## 2021-12-02 ENCOUNTER — Ambulatory Visit
Admission: RE | Admit: 2021-12-02 | Discharge: 2021-12-02 | Disposition: A | Discharge status: Home / Self Care | Payer: Medicare HMO | Source: Ambulatory Visit | Attending: Internal Medicine | Admitting: Internal Medicine

## 2021-12-02 DIAGNOSIS — M810 Age-related osteoporosis without current pathological fracture: Secondary | ICD-10-CM

## 2021-12-02 DIAGNOSIS — Z780 Asymptomatic menopausal state: Secondary | ICD-10-CM | POA: Diagnosis not present

## 2022-01-02 ENCOUNTER — Other Ambulatory Visit: Payer: Self-pay | Admitting: Internal Medicine

## 2022-01-13 ENCOUNTER — Inpatient Hospital Stay: Payer: Medicare HMO | Admitting: Family

## 2022-01-13 ENCOUNTER — Other Ambulatory Visit: Payer: Self-pay

## 2022-01-13 ENCOUNTER — Telehealth: Payer: Self-pay | Admitting: Internal Medicine

## 2022-01-13 ENCOUNTER — Encounter: Payer: Self-pay | Admitting: Family

## 2022-01-13 ENCOUNTER — Inpatient Hospital Stay: Payer: Medicare HMO | Attending: Hematology & Oncology

## 2022-01-13 VITALS — BP 118/71 | HR 75 | Temp 97.9°F | Resp 18 | Ht 65.0 in | Wt 85.1 lb

## 2022-01-13 DIAGNOSIS — D510 Vitamin B12 deficiency anemia due to intrinsic factor deficiency: Secondary | ICD-10-CM | POA: Diagnosis not present

## 2022-01-13 DIAGNOSIS — Z79899 Other long term (current) drug therapy: Secondary | ICD-10-CM | POA: Diagnosis not present

## 2022-01-13 DIAGNOSIS — D002 Carcinoma in situ of stomach: Secondary | ICD-10-CM

## 2022-01-13 DIAGNOSIS — D509 Iron deficiency anemia, unspecified: Secondary | ICD-10-CM | POA: Diagnosis not present

## 2022-01-13 DIAGNOSIS — G35 Multiple sclerosis: Secondary | ICD-10-CM | POA: Insufficient documentation

## 2022-01-13 DIAGNOSIS — D500 Iron deficiency anemia secondary to blood loss (chronic): Secondary | ICD-10-CM

## 2022-01-13 DIAGNOSIS — Z85028 Personal history of other malignant neoplasm of stomach: Secondary | ICD-10-CM | POA: Diagnosis not present

## 2022-01-13 DIAGNOSIS — K909 Intestinal malabsorption, unspecified: Secondary | ICD-10-CM

## 2022-01-13 DIAGNOSIS — E538 Deficiency of other specified B group vitamins: Secondary | ICD-10-CM

## 2022-01-13 LAB — CBC WITH DIFFERENTIAL (CANCER CENTER ONLY)
Abs Immature Granulocytes: 0.01 K/uL (ref 0.00–0.07)
Basophils Absolute: 0 K/uL (ref 0.0–0.1)
Basophils Relative: 1 %
Eosinophils Absolute: 0.1 K/uL (ref 0.0–0.5)
Eosinophils Relative: 3 %
HCT: 36.4 % (ref 36.0–46.0)
Hemoglobin: 11.7 g/dL — ABNORMAL LOW (ref 12.0–15.0)
Immature Granulocytes: 0 %
Lymphocytes Relative: 57 %
Lymphs Abs: 1.5 K/uL (ref 0.7–4.0)
MCH: 30.4 pg (ref 26.0–34.0)
MCHC: 32.1 g/dL (ref 30.0–36.0)
MCV: 94.5 fL (ref 80.0–100.0)
Monocytes Absolute: 0.3 K/uL (ref 0.1–1.0)
Monocytes Relative: 10 %
Neutro Abs: 0.8 K/uL — ABNORMAL LOW (ref 1.7–7.7)
Neutrophils Relative %: 29 %
Platelet Count: 146 K/uL — ABNORMAL LOW (ref 150–400)
RBC: 3.85 MIL/uL — ABNORMAL LOW (ref 3.87–5.11)
RDW: 11.8 % (ref 11.5–15.5)
WBC Count: 2.7 K/uL — ABNORMAL LOW (ref 4.0–10.5)
nRBC: 0 % (ref 0.0–0.2)

## 2022-01-13 LAB — RETICULOCYTES
Immature Retic Fract: 7.5 % (ref 2.3–15.9)
RBC.: 3.93 MIL/uL (ref 3.87–5.11)
Retic Count, Absolute: 36.2 10*3/uL (ref 19.0–186.0)
Retic Ct Pct: 0.9 % (ref 0.4–3.1)

## 2022-01-13 LAB — CMP (CANCER CENTER ONLY)
ALT: 15 U/L (ref 0–44)
AST: 26 U/L (ref 15–41)
Albumin: 4.1 g/dL (ref 3.5–5.0)
Alkaline Phosphatase: 63 U/L (ref 38–126)
Anion gap: 6 (ref 5–15)
BUN: 16 mg/dL (ref 8–23)
CO2: 29 mmol/L (ref 22–32)
Calcium: 9.7 mg/dL (ref 8.9–10.3)
Chloride: 106 mmol/L (ref 98–111)
Creatinine: 1.02 mg/dL — ABNORMAL HIGH (ref 0.44–1.00)
GFR, Estimated: 58 mL/min — ABNORMAL LOW (ref >60)
Glucose, Bld: 95 mg/dL (ref 70–99)
Potassium: 5.3 mmol/L — ABNORMAL HIGH (ref 3.5–5.1)
Sodium: 141 mmol/L (ref 135–145)
Total Bilirubin: 0.6 mg/dL (ref 0.3–1.2)
Total Protein: 7.6 g/dL (ref 6.5–8.1)

## 2022-01-13 LAB — FERRITIN: Ferritin: 37 ng/mL (ref 11–307)

## 2022-01-13 LAB — IRON AND IRON BINDING CAPACITY (CC-WL,HP ONLY)
Iron: 90 ug/dL (ref 28–170)
Saturation Ratios: 23 % (ref 10.4–31.8)
TIBC: 400 ug/dL (ref 250–450)
UIBC: 310 ug/dL (ref 148–442)

## 2022-01-13 LAB — LACTATE DEHYDROGENASE: LDH: 145 U/L (ref 98–192)

## 2022-01-13 NOTE — Progress Notes (Signed)
Hematology and Oncology Follow Up Visit  Erika Murphy [id number] 1950/06/03 72 y.o. 01/13/2022   Principle Diagnosis:  Stage II (T3N0M0) adenocarcinoma of the stomach-remission Recurrent iron deficiency anemia  Pernicious anemia Multiple sclerosis   Current Therapy:        IV iron as indicated Neupogen PRN prior to procedures Vitamin B-12 1 mg IM every month -- done at home Prolia 60 mg IM q 6 months -- with PCP   Interim History:  Erika Murphy is here today for follow-up. She is doing fairly well but does note some fatigue. Her biggest issue is what sounds like frozen shoulder on the right.  She denies any falls or syncope. She ambulates with a cane for added support.  The numbness and tingling in her hands and feet that she states is due to MS is unchanged from baseline.  Mild SOB with exertion.  No fever, chills, n/v, cough, rash, dizziness, palpitations or changes in bowel or bladder habits.  She has abdominal pain when she needs to have a BM or urinate. This resolves once she goes. She takes colace or uses a suppository when needed for constipation.   No blood loss noted. No bruising or petechiae.  Appetite comes and goes. She is doing her best to stay hydrated. Her weight is unchanged at 85 lbs.   ECOG Performance Status: 1 - Symptomatic but completely ambulatory  Medications:  Allergies as of 01/13/2022   No Known Allergies      Medication List        Accurate as of January 13, 2022 10:46 AM. If you have any questions, ask your nurse or doctor.          albuterol 108 (90 Base) MCG/ACT inhaler Commonly known as: VENTOLIN HFA INHALE 2 PUFFS INTO THE LUNGS EVERY 6 HOURS AS NEEDED FOR SHORTNESS OF BREATH/WHEEZING   ALPRAZolam 0.5 MG tablet Commonly known as: Xanax Take one half tab twice daily as needed for anxiety.   amLODipine 2.5 MG tablet Commonly known as: NORVASC TAKE 1 TABLET BY MOUTH EVERY DAY   cyanocobalamin 1000 MCG/ML injection Commonly known as:  VITAMIN B12 INJECT 1 ML (1,000 MCG TOTAL) INTO THE MUSCLE ONCE A MONTH AS DIRECTED   denosumab 60 MG/ML Sosy injection Commonly known as: PROLIA Inject 60 mg into the skin every 6 (six) months.   esomeprazole 40 MG capsule Commonly known as: NEXIUM TAKE 1 CAPSULE (40 MG TOTAL) BY MOUTH DAILY AT 12 NOON.   meclizine 25 MG tablet Commonly known as: ANTIVERT Take 1 tablet (25 mg total) by mouth 3 (three) times daily as needed for dizziness.   methocarbamol 500 MG tablet Commonly known as: ROBAXIN Take 1 tablet (500 mg total) by mouth every 8 (eight) hours as needed for muscle spasms.   MULTIVITAMIN GUMMIES WOMENS PO Take 1 tablet by mouth daily.   pregabalin 50 MG capsule Commonly known as: LYRICA ONE CAPSULE 3 TIMES A DAY BY MOUTH   rosuvastatin 5 MG tablet Commonly known as: Crestor Take 1 tablet (5 mg total) by mouth daily.        Allergies: No Known Allergies  Past Medical History, Surgical history, Social history, and Family History were reviewed and updated.  Review of Systems: All other 10 point review of systems is negative.   Physical Exam:  vitals were not taken for this visit.   Wt Readings from Last 3 Encounters:  11/18/21 85 lb 1.3 oz (38.6 kg)  10/07/21 84 lb (38.1 kg)  10/06/21 85 lb (38.6 kg)    Ocular: Sclerae unicteric, pupils equal, round and reactive to light Ear-nose-throat: Oropharynx clear, dentition fair Lymphatic: No cervical or supraclavicular adenopathy Lungs no rales or rhonchi, good excursion bilaterally Heart regular rate and rhythm, no murmur appreciated Abd soft, nontender, positive bowel sounds MSK no focal spinal tenderness, no joint edema Neuro: non-focal, well-oriented, appropriate affect Breasts: Deferred   Lab Results  Component Value Date   WBC 2.7 (L) 01/13/2022   HGB 11.7 (L) 01/13/2022   HCT 36.4 01/13/2022   MCV 94.5 01/13/2022   PLT 146 (L) 01/13/2022   Lab Results  Component Value Date   FERRITIN 41  11/18/2021   IRON 98 11/18/2021   TIBC 396 11/18/2021   UIBC 298 11/18/2021   IRONPCTSAT 25 11/18/2021   Lab Results  Component Value Date   RETICCTPCT 0.9 01/13/2022   RBC 3.93 01/13/2022   RETICCTABS 45.7 06/12/2014   No results found for: "KPAFRELGTCHN", "LAMBDASER", "KAPLAMBRATIO" No results found for: "IGGSERUM", "IGA", "IGMSERUM" No results found for: "TOTALPROTELP", "ALBUMINELP", "A1GS", "A2GS", "BETS", "BETA2SER", "GAMS", "MSPIKE", "SPEI"   Chemistry      Component Value Date/Time   NA 145 11/18/2021 1138   NA 144 06/12/2016 1151   NA 142 06/11/2015 1156   K 3.8 11/18/2021 1138   K 4.7 06/12/2016 1151   K 4.3 06/11/2015 1156   CL 108 11/18/2021 1138   CL 107 06/12/2016 1151   CO2 29 11/18/2021 1138   CO2 32 06/12/2016 1151   CO2 29 06/11/2015 1156   BUN 18 11/18/2021 1138   BUN 11 06/12/2016 1151   BUN 13.5 06/11/2015 1156   CREATININE 1.03 (H) 11/18/2021 1138   CREATININE 1.06 (H) 05/13/2021 1217   CREATININE 1.2 (H) 06/11/2015 1156      Component Value Date/Time   CALCIUM 9.5 11/18/2021 1138   CALCIUM 9.6 06/12/2016 1151   CALCIUM 9.7 06/11/2015 1156   ALKPHOS 72 11/18/2021 1138   ALKPHOS 97 (H) 06/12/2016 1151   ALKPHOS 111 06/11/2015 1156   AST 28 11/18/2021 1138   AST 30 06/11/2015 1156   ALT 24 11/18/2021 1138   ALT 26 06/12/2016 1151   ALT 23 06/11/2015 1156   BILITOT 0.6 11/18/2021 1138   BILITOT 1.03 06/11/2015 1156       Impression and Plan: Erika Murphy is a very pleasant 72 yo African American female with remote history of stage II stomach cancer which was resected over 20 years ago.  Iron studies are pending.  Follow-up in 8 weeks.   Lottie Dawson, NP 1/12/202410:46 AM

## 2022-01-13 NOTE — Telephone Encounter (Signed)
Erika Murphy [phone]  [name] called to see how much Vit D she should be taking. She said when we called her with her Bone Density results she asked and we had not called her back to let her know, so she wanted to follow up on it.

## 2022-01-16 NOTE — Telephone Encounter (Signed)
Called and let patient know which Vit. D to take per Dr Renold Genta, she verbalized understanding

## 2022-01-25 DIAGNOSIS — Z4789 Encounter for other orthopedic aftercare: Secondary | ICD-10-CM | POA: Diagnosis not present

## 2022-01-27 ENCOUNTER — Other Ambulatory Visit: Payer: Medicare HMO

## 2022-01-27 DIAGNOSIS — R5383 Other fatigue: Secondary | ICD-10-CM | POA: Diagnosis not present

## 2022-01-27 DIAGNOSIS — I10 Essential (primary) hypertension: Secondary | ICD-10-CM

## 2022-01-27 DIAGNOSIS — E7800 Pure hypercholesterolemia, unspecified: Secondary | ICD-10-CM

## 2022-01-28 LAB — LIPID PANEL
Cholesterol: 172 mg/dL (ref <200)
HDL: 57 mg/dL (ref >=50)
LDL Cholesterol (Calc): 95 mg/dL (calc)
Non-HDL Cholesterol (Calc): 115 mg/dL (calc) (ref <130)
Total CHOL/HDL Ratio: 3 (calc) (ref <5.0)
Triglycerides: 106 mg/dL (ref <150)

## 2022-01-28 LAB — CBC WITH DIFFERENTIAL/PLATELET
Absolute Monocytes: 253 cells/uL (ref 200–950)
Basophils Absolute: 20 cells/uL (ref 0–200)
Basophils Relative: 0.8 %
Eosinophils Absolute: 80 cells/uL (ref 15–500)
Eosinophils Relative: 3.2 %
HCT: 34.9 % — ABNORMAL LOW (ref 35.0–45.0)
Hemoglobin: 11.5 g/dL — ABNORMAL LOW (ref 11.7–15.5)
Lymphs Abs: 1063 cells/uL (ref 850–3900)
MCH: 30 pg (ref 27.0–33.0)
MCHC: 33 g/dL (ref 32.0–36.0)
MCV: 91.1 fL (ref 80.0–100.0)
MPV: 11.9 fL (ref 7.5–12.5)
Monocytes Relative: 10.1 %
Neutro Abs: 1085 cells/uL — ABNORMAL LOW (ref 1500–7800)
Neutrophils Relative %: 43.4 %
Platelets: 156 10*3/uL (ref 140–400)
RBC: 3.83 10*6/uL (ref 3.80–5.10)
RDW: 11.6 % (ref 11.0–15.0)
Total Lymphocyte: 42.5 %
WBC: 2.5 10*3/uL — ABNORMAL LOW (ref 3.8–10.8)

## 2022-01-28 LAB — COMPLETE METABOLIC PANEL WITH GFR
AG Ratio: 1.3 (calc) (ref 1.0–2.5)
ALT: 21 U/L (ref 6–29)
AST: 32 U/L (ref 10–35)
Albumin: 4 g/dL (ref 3.6–5.1)
Alkaline phosphatase (APISO): 59 U/L (ref 37–153)
BUN: 17 mg/dL (ref 7–25)
CO2: 27 mmol/L (ref 20–32)
Calcium: 9.2 mg/dL (ref 8.6–10.4)
Chloride: 108 mmol/L (ref 98–110)
Creat: 0.93 mg/dL (ref 0.60–1.00)
Globulin: 3.1 g/dL (calc) (ref 1.9–3.7)
Glucose, Bld: 85 mg/dL (ref 65–99)
Potassium: 4.4 mmol/L (ref 3.5–5.3)
Sodium: 144 mmol/L (ref 135–146)
Total Bilirubin: 0.6 mg/dL (ref 0.2–1.2)
Total Protein: 7.1 g/dL (ref 6.1–8.1)
eGFR: 65 mL/min/{1.73_m2} (ref >=60)

## 2022-01-28 LAB — TSH: TSH: 2 mIU/L (ref 0.40–4.50)

## 2022-02-02 NOTE — Progress Notes (Signed)
Annual Wellness Visit    Patient Care Team: Kaliope Quinonez, Cresenciano Lick, MD as PCP - General (Internal Medicine) Lorretta Harp, MD as PCP - Cardiology (Cardiology) [name], MD as Consulting Physician (Oncology)  Visit Date: 02/03/22   Chief Complaint  Patient presents with   Medicare Wellness   Annual Exam    Subjective:   Patient: Erika Murphy, Female    DOB: 1950/05/13, 72 y.o.   MRN: [id number]  Erika Murphy is a 72 y.o. Female who presents today for her Annual Wellness Visit. She has a history of thyroid disease, osteoporosis, hypertension, arthritis, asthma, stomach cancer, anemia, fibromyalgia, GERD, vitamin B12 deficiency, iron deficiency.  History of multiple sclerosis and fibromyalgia treated with Lyrica 50 mg three times daily, Robaxin 500 mg every 8 hours as needed.  She has a history of right shoulder pain. She continues to experience limited range of motion. She cannot raise her arm to put her purse over her shoulder. She is followed by her orthopedist.  History of hypertension treated with Norvasc 2.5 mg daily.  History of hyperlipidemia treated with Crestor 5 mg daily.  History of anemia. Last HGB measured at 11.5, HCT at 34.9 on 01/27/22, compared to 11.7 and 36.4 on 01/13/22.  History of Vitamin B12 deficiency treated with Vitamin B12 1000 mcg once monthly as directed.  History of iron deficiency. Fe at 90 on 01/13/22. She has had iron infusions in the past.  History of benign leukopenia. Her last WBC measured at 2.5 on 01/27/22.  History of asthma treated with albuterol 2 puffs every 6 hours as needed.  History of anxiety treated with Xanax 0.25 mg twice daily as needed.  History of osteoporosis treated with Prolia 60 mg every 6 months.  History of GERD treated with Nexium 40 mg daily at noon.  Mammogram last completed 02/11/21. Results showed no mammographic evidence of malignancy. Recommended repeat in 2024.  Colonoscopy last completed 05/04/2017.  Three 3-4 mm polyps in the proximal descending colon removed with cold snare, resected and retrieved. Recommended repeat in 2029.  Dexa scan last completed 12/02/21. Results show BMD measured at Forearm Radius 33% is 0.634 g/cm2 with a T-score of -2.8. This patient is considered osteoporotic according to [facility]) criteria.   Past Medical History:  Diagnosis Date   Anxiety    Arthritis    "right shoulder" (10/09/2013)   Asthma    pt denies   B12 deficiency anemia    pt denies   Fibromyalgia    GERD (gastroesophageal reflux disease)    History of blood transfusion 2001   "when I had my stomach cancer"   Hypertension    IBS (irritable bowel syndrome)    Iron deficiency anemia 12/11/2014   Malabsorption of iron 12/11/2014   MS (multiple sclerosis) ([place]) dx'd ~ 1998   Neurogenic bladder    due to MS   Osteopenia    Osteoporosis    PONV (postoperative nausea and vomiting)    Stomach cancer ([place]) 2001   Thyroid nodule    right     Family History  Problem Relation Age of Onset   Heart disease Mother    Diabetes Father    Stomach cancer Brother    Multiple sclerosis Other    Breast cancer Sister        2 sisters breast cancer    Breast cancer Maternal Aunt      Social History   Social History Narrative  Patient is retired.    Patient is married with 3 children.   Patient drinks caffeine daily.      Review of Systems  Constitutional:  Negative for chills, fever, malaise/fatigue and weight loss.  HENT:  Negative for hearing loss, sinus pain and sore throat.   Respiratory:  Negative for cough and hemoptysis.   Cardiovascular:  Negative for chest pain, palpitations, leg swelling and PND.  Gastrointestinal:  Negative for abdominal pain, constipation, diarrhea, heartburn, nausea and vomiting.  Genitourinary:  Negative for dysuria, frequency and urgency.  Musculoskeletal:  Negative for back pain, myalgias and neck pain.       (+) Right arm limited  range of motion  Skin:  Negative for itching and rash.  Neurological:  Negative for dizziness, tingling, seizures and headaches.  Endo/Heme/Allergies:  Negative for polydipsia.  Psychiatric/Behavioral:  Negative for depression. The patient is not nervous/anxious.       Objective:   Vitals: BP 104/72   Pulse 82   Temp 98.2 F (36.8 C) (Tympanic)   Ht 5' 5"$  (1.651 m)   Wt 87 lb 12.8 oz (39.8 kg)   SpO2 98%   BMI 14.61 kg/m   Physical Exam Vitals and nursing note reviewed. Exam conducted with a chaperone present.  Constitutional:      General: She is not in acute distress.    Appearance: Normal appearance. She is not ill-appearing or toxic-appearing.  HENT:     Head: Normocephalic and atraumatic.     Right Ear: Hearing, tympanic membrane, ear canal and external ear normal.     Left Ear: Hearing, tympanic membrane, ear canal and external ear normal.     Mouth/Throat:     Pharynx: Oropharynx is clear.  Eyes:     Extraocular Movements: Extraocular movements intact.     Pupils: Pupils are equal, round, and reactive to light.  Neck:     Thyroid: No thyroid mass, thyromegaly or thyroid tenderness.     Vascular: No carotid bruit.  Cardiovascular:     Rate and Rhythm: Normal rate and regular rhythm. No extrasystoles are present.    Pulses: Normal pulses.          Dorsalis pedis pulses are 2+ on the right side and 2+ on the left side.       Posterior tibial pulses are 2+ on the right side and 2+ on the left side.     Heart sounds: Murmur heard.     Systolic murmur is present with a grade of 1/6.     No friction rub. No gallop.  Pulmonary:     Effort: Pulmonary effort is normal.     Breath sounds: Normal breath sounds. No decreased breath sounds, wheezing, rhonchi or rales.  Chest:     Chest wall: No mass.  Abdominal:     Palpations: Abdomen is soft. There is no hepatomegaly, splenomegaly or mass.     Tenderness: There is no abdominal tenderness.     Hernia: No hernia is  present.  Musculoskeletal:     Cervical back: Normal range of motion.     Right lower leg: No edema.     Left lower leg: No edema.  Lymphadenopathy:     Cervical: No cervical adenopathy.     Upper Body:     Right upper body: No supraclavicular adenopathy.     Left upper body: No supraclavicular adenopathy.  Skin:    General: Skin is warm and dry.  Neurological:     General:  No focal deficit present.     Mental Status: She is alert and oriented to person, place, and time. Mental status is at baseline.     Sensory: Sensation is intact.     Motor: Motor function is intact. No weakness.     Deep Tendon Reflexes: Reflexes are normal and symmetric.  Psychiatric:        Attention and Perception: Attention normal.        Mood and Affect: Mood normal.        Speech: Speech normal.        Behavior: Behavior normal.        Thought Content: Thought content normal.        Cognition and Memory: Cognition normal.        Judgment: Judgment normal.      Most recent functional status assessment:    02/03/2022   10:05 AM  In your present state of health, do you have any difficulty performing the following activities:  Hearing? 0  Vision? 0  Difficulty concentrating or making decisions? 0  Walking or climbing stairs? 1  Dressing or bathing? 0  Doing errands, shopping? 0  Preparing Food and eating ? N  Using the Toilet? N  In the past six months, have you accidently leaked urine? N  Do you have problems with loss of bowel control? N  Managing your Medications? N  Managing your Finances? N  Housekeeping or managing your Housekeeping? N   Most recent fall risk assessment:    02/03/2022   10:03 AM  Fall Risk   Falls in the past year? 0  Number falls in past yr: 0  Injury with Fall? 0  Risk for fall due to : Impaired mobility;Impaired balance/gait  Follow up Falls prevention discussed    Most recent depression screenings:    02/03/2022   10:03 AM 09/23/2021   12:12 PM  PHQ 2/9 Scores   PHQ - 2 Score 0 0   Most recent cognitive screening:    02/03/2022   10:06 AM  6CIT Screen  What Year? 0 points  What month? 0 points  What time? 0 points  Count back from 20 0 points  Months in reverse 4 points  Repeat phrase 2 points  Total Score 6 points     Results:   Studies obtained and personally reviewed by me:  Mammogram last completed 02/11/21. Results showed no mammographic evidence of malignancy. Recommended repeat in 2024.  Colonoscopy last completed 05/04/2017. Three 3-4 mm polyps in the proximal descending colon removed with cold snare, resected and retrieved. Recommended repeat in 2029.  Dexa scan last completed on 12/02/21. Results show BMD measured at Forearm Radius 33% is 0.634 g/cm2 with a T-score of -2.8. This patient is considered osteoporotic according to [facility]) criteria.   Labs:       Component Value Date/Time   NA 144 01/27/2022 0921   NA 144 06/12/2016 1151   NA 142 06/11/2015 1156   K 4.4 01/27/2022 0921   K 4.7 06/12/2016 1151   K 4.3 06/11/2015 1156   CL 108 01/27/2022 0921   CL 107 06/12/2016 1151   CO2 27 01/27/2022 0921   CO2 32 06/12/2016 1151   CO2 29 06/11/2015 1156   GLUCOSE 85 01/27/2022 0921   GLUCOSE 88 06/12/2016 1151   BUN 17 01/27/2022 0921   BUN 11 06/12/2016 1151   BUN 13.5 06/11/2015 1156   CREATININE 0.93 01/27/2022 0921   CREATININE 1.2 (  H) 06/11/2015 1156   CALCIUM 9.2 01/27/2022 0921   CALCIUM 9.6 06/12/2016 1151   CALCIUM 9.7 06/11/2015 1156   PROT 7.1 01/27/2022 0921   PROT 7.5 06/12/2016 1151   PROT 7.5 06/11/2015 1156   ALBUMIN 4.1 01/13/2022 1020   ALBUMIN 3.8 06/12/2016 1151   ALBUMIN 3.8 06/11/2015 1156   AST 32 01/27/2022 0921   AST 26 01/13/2022 1020   AST 30 06/11/2015 1156   ALT 21 01/27/2022 0921   ALT 15 01/13/2022 1020   ALT 26 06/12/2016 1151   ALT 23 06/11/2015 1156   ALKPHOS 63 01/13/2022 1020   ALKPHOS 97 (H) 06/12/2016 1151   ALKPHOS 111 06/11/2015 1156    BILITOT 0.6 01/27/2022 0921   BILITOT 0.6 01/13/2022 1020   BILITOT 1.03 06/11/2015 1156   GFRNONAA 58 (L) 01/13/2022 1020   GFRNONAA 73 01/16/2020 0949   GFRAA 85 01/16/2020 0949     Lab Results  Component Value Date   WBC 2.5 (L) 01/27/2022   HGB 11.5 (L) 01/27/2022   HCT 34.9 (L) 01/27/2022   MCV 91.1 01/27/2022   PLT 156 01/27/2022    Lab Results  Component Value Date   CHOL 172 01/27/2022   HDL 57 01/27/2022   LDLCALC 95 01/27/2022   TRIG 106 01/27/2022   CHOLHDL 3.0 01/27/2022    Lab Results  Component Value Date   HGBA1C 5.0 05/13/2021     Lab Results  Component Value Date   TSH 2.00 01/27/2022    Assessment & Plan:   Multiple sclerosis and Fibromyalgia: Well-controlled with Lyrica 50 mg three times daily, Robaxin 500 mg every 8 hours as needed.  Right Shoulder Pain: Continues to experience limited range of motion. She cannot raise her arm to put her purse over her shoulder. She is followed by her orthopedist.  Hypertension: Well-controlled with Norvasc 2.5 mg daily.  Hyperlipidemia: Well-controlled with Crestor 5 mg daily.  Anemia: Last HGB measured at 11.5, HCT at 34.9 on 01/27/22, compared to 11.7 and 36.4 on 01/13/22.  Vitamin B12 Deficiency: Well-controlled with Vitamin B12 1000 mcg once monthly as directed.  Iron Deficiency: Fe at 90 on 01/13/22. She has had iron infusions in the past.  Benign Leukopenia: Her last WBC measured at 2.5 on 01/27/22.  Asthma: Well-controlled with albuterol 2 puffs every 6 hours as needed.  Anxiety: Well-controlled with Xanax 0.25 mg twice daily as needed.  Osteoporosis: Well-controlled with Prolia 60 mg every 6 months.  GERD: Well-controlled with Nexium 40 mg daily at noon.  Mammogram: Last completed 02/11/21. Results showed no mammographic evidence of malignancy. Recommended repeat in 2024.  Colonoscopy: Last completed 05/04/2017. Three 3-4 mm polyps in the proximal descending colon removed with cold snare, resected  and retrieved. Recommended repeat in 2029.  Dexa scan: Last completed 12/02/21. Results show BMD measured at Forearm Radius 33% is 0.634 g/cm2 with a T-score of -2.8. This patient is considered osteoporotic according to [facility]) criteria.  Health Maintenance: Eat plenty of protein. Urine culture ordered.  Vaccine Counseling: Will get tetanus, pneumococcal 20 vaccines at the drug store, each on separate days. Reports she is UTD on Covid-19, flu.      Annual wellness visit done today including the all of the following: Reviewed patient's Family Medical History Reviewed and updated list of patient's medical providers Assessment of cognitive impairment was done Assessed patient's functional ability Established a written schedule for health screening [place] Completed and Reviewed  Discussed health benefits  of physical activity, and encouraged her to engage in regular exercise appropriate for her age and condition.        I,Alexander Ruley,acting as a Education administrator for Elby Showers, MD.,have documented all relevant documentation on the behalf of Elby Showers, MD,as directed by  Elby Showers, MD while in the presence of Elby Showers, MD.   I, Elby Showers, MD, have reviewed all documentation for this visit. The documentation on 02/19/22 for the exam, diagnosis, procedures, and orders are all accurate and complete.

## 2022-02-03 ENCOUNTER — Encounter: Payer: Self-pay | Admitting: Internal Medicine

## 2022-02-03 ENCOUNTER — Ambulatory Visit: Payer: Medicare HMO | Admitting: Internal Medicine

## 2022-02-03 VITALS — BP 104/72 | HR 82 | Temp 98.2°F | Ht 65.0 in | Wt 87.8 lb

## 2022-02-03 DIAGNOSIS — G35 Multiple sclerosis: Secondary | ICD-10-CM | POA: Diagnosis not present

## 2022-02-03 DIAGNOSIS — R82998 Other abnormal findings in urine: Secondary | ICD-10-CM

## 2022-02-03 DIAGNOSIS — Z903 Acquired absence of stomach [part of]: Secondary | ICD-10-CM | POA: Diagnosis not present

## 2022-02-03 DIAGNOSIS — Z862 Personal history of diseases of the blood and blood-forming organs and certain disorders involving the immune mechanism: Secondary | ICD-10-CM

## 2022-02-03 DIAGNOSIS — Z85028 Personal history of other malignant neoplasm of stomach: Secondary | ICD-10-CM

## 2022-02-03 DIAGNOSIS — M19011 Primary osteoarthritis, right shoulder: Secondary | ICD-10-CM | POA: Diagnosis not present

## 2022-02-03 DIAGNOSIS — I10 Essential (primary) hypertension: Secondary | ICD-10-CM | POA: Diagnosis not present

## 2022-02-03 DIAGNOSIS — M810 Age-related osteoporosis without current pathological fracture: Secondary | ICD-10-CM

## 2022-02-03 DIAGNOSIS — E7800 Pure hypercholesterolemia, unspecified: Secondary | ICD-10-CM

## 2022-02-03 DIAGNOSIS — Z0000 Encounter for general adult medical examination without abnormal findings: Secondary | ICD-10-CM | POA: Diagnosis not present

## 2022-02-03 DIAGNOSIS — E538 Deficiency of other specified B group vitamins: Secondary | ICD-10-CM | POA: Diagnosis not present

## 2022-02-03 LAB — POCT URINALYSIS DIPSTICK
Bilirubin, UA: NEGATIVE
Blood, UA: NEGATIVE
Glucose, UA: NEGATIVE
Ketones, UA: NEGATIVE
Nitrite, UA: NEGATIVE
Protein, UA: NEGATIVE
Spec Grav, UA: 1.015 (ref 1.010–1.025)
Urobilinogen, UA: 1 E.U./dL
pH, UA: 6 (ref 5.0–8.0)

## 2022-02-03 NOTE — Patient Instructions (Addendum)
Suggest Pneumococcal 20 vaccine and RSV vaccine as well as tetanus vaccine. May be obtained at pharmacy.RTC in one year or as needed.

## 2022-02-04 LAB — URINE CULTURE
MICRO NUMBER:: 14511418
SPECIMEN QUALITY:: ADEQUATE

## 2022-02-06 ENCOUNTER — Ambulatory Visit: Payer: Medicare HMO | Admitting: Neurology

## 2022-02-06 ENCOUNTER — Encounter: Payer: Self-pay | Admitting: Neurology

## 2022-02-06 VITALS — BP 120/66 | HR 65 | Ht 64.0 in | Wt 88.0 lb

## 2022-02-06 DIAGNOSIS — G35 Multiple sclerosis: Secondary | ICD-10-CM

## 2022-02-06 DIAGNOSIS — R269 Unspecified abnormalities of gait and mobility: Secondary | ICD-10-CM | POA: Diagnosis not present

## 2022-02-06 NOTE — Progress Notes (Signed)
Chief Complaint  Patient presents with   Follow-up    Rm 17 alone. Pt reports since last f/u intermittent head pain has been more noticeable.      ASSESSMENT AND PLAN  Erika Murphy is a 72 y.o. female   Multiple sclerosis  Gait abnormality New onset headaches,  Diagnosis since 1998,  Stable,  History of gastric cancer, status postgastrectomy, has not been on long-term immunomodulation therapy for many years, no clinical flareup  Repeat MRI of the brain and cervical spine in October 2023 was stable, evidence of C5-6 lesions,  Her headache overall has improved, continue to observe  Order to physical therapy, walker Rx.  DIAGNOSTIC DATA (LABS, IMAGING, TESTING) - I reviewed patient records, labs, notes, testing and imaging myself where available.   MEDICAL HISTORY:  Erika Murphy is a 72 year old female, following up for multiple sclerosis, she was seen by Dr. Erling Cruz in the past, her primary care physician is Dr. Renold Genta, Erika Murphy      I reviewed and summarized the referring note. PMHX. HTN History of Gastric cancer, s/p gastrectomy,  Right shoulder reverse replacement, still has limited range of motion  She was diagnosed with multiple sclerosis since 1998, presented with spastic gait disorder, Betaseron from 1998-2011, was stopped due to abnormal liver functional test, which did recover after stopping progesterone,  Symptoms from multiple sclerosis has been fairly stable, mainly presenting with gait abnormality, some neuropathic pain, is taking Lyrica 50 mg 3 times a day,  Multiple repeat MRIs cervical and brain scan of the past many years showed no significant change  She did have a history of right hip replacement, gastric cancer required partial gastrectomy, has not been on any long-term immunomodulation therapy for her MS  She was seen by Judson Roch after lost follow-up for few years for intermittent right parietal area headaches, transient, only few seconds, but severe, there is  no significant worsening of her functional status, able to ambulate with a cane, no bowel or bladder incontinence, complains of right shoulder pain, limited range of motion despite right reverse shoulder replacement  Personally reviewed MRI of the brain with without contrast October 2023, few periventricular subcortical nonspecific T2 hyperintensity  MRI of cervical spine, chronic demyelinating plaque anteriorly and centrally at C5-6 level, mild degenerative changes Laboratory evaluation in January 2024, normal lipid panel, TSH, CMP, CBC showed mild anemia hemoglobin of 11.5, leukocytosis WBC of 2.5, PHYSICAL EXAM:   Vitals:   02/06/22 1456  BP: 120/66  Pulse: 65  Weight: 88 lb (39.9 kg)  Height: '5\' 4"'$  (1.626 m)   Body mass index is 15.11 kg/m.  PHYSICAL EXAMNIATION:  Gen: NAD, conversant, well nourised, well groomed                     Cardiovascular: Regular rate rhythm, no peripheral edema, warm, nontender. Eyes: Conjunctivae clear without exudates or hemorrhage Neck: Supple, no carotid bruits. Pulmonary: Clear to auscultation bilaterally   NEUROLOGICAL EXAM:  MENTAL STATUS: Speech/cognition: Very thin, colorful hair, awake, alert, oriented to history taking and casual conversation CRANIAL NERVES: CN II: Visual fields are full to confrontation. Pupils are round equal and briskly reactive to light. CN III, IV, VI: extraocular movement are normal. No ptosis. CN V: Facial sensation is intact to light touch CN VII: Face is symmetric with normal eye closure  CN VIII: Hearing is normal to causal conversation. CN IX, X: Phonation is normal. CN XI: Head turning and shoulder shrug are intact  MOTOR:  Limited range of motion of right shoulder, moderate lower extremity spasticity, mild to moderate hip flexion weakness.  REFLEXES: Reflexes are 2+ and symmetric at the biceps, triceps, knees, and ankles.  SENSORY: Intact to light touch, pinprick and vibratory sensation are intact in  fingers and toes.  COORDINATION: There is no trunk or limb dysmetria noted.  GAIT/STANCE: Need push-up to get up from seated position, rely on her cane, spastic cautious unsteady gait   REVIEW OF SYSTEMS:  Full 14 system review of systems performed and notable only for as above All other review of systems were negative.   ALLERGIES: No Known Allergies  HOME MEDICATIONS: Current Outpatient Medications  Medication Sig Dispense Refill   albuterol (VENTOLIN HFA) 108 (90 Base) MCG/ACT inhaler INHALE 2 PUFFS INTO THE LUNGS EVERY 6 HOURS AS NEEDED FOR SHORTNESS OF BREATH/WHEEZING 8.5 each 11   ALPRAZolam (XANAX) 0.5 MG tablet Take one half tab twice daily as needed for anxiety. 60 tablet 1   amLODipine (NORVASC) 2.5 MG tablet TAKE 1 TABLET BY MOUTH EVERY DAY 90 tablet 3   cyanocobalamin (VITAMIN B12) 1000 MCG/ML injection INJECT 1 ML (1,000 MCG TOTAL) INTO THE MUSCLE ONCE A MONTH AS DIRECTED 3 mL 3   denosumab (PROLIA) 60 MG/ML SOSY injection Inject 60 mg into the skin every 6 (six) months.     esomeprazole (NEXIUM) 40 MG capsule TAKE 1 CAPSULE (40 MG TOTAL) BY MOUTH DAILY AT 12 NOON. 90 capsule 3   meclizine (ANTIVERT) 25 MG tablet Take 1 tablet (25 mg total) by mouth 3 (three) times daily as needed for dizziness. 30 tablet 0   methocarbamol (ROBAXIN) 500 MG tablet Take 1 tablet (500 mg total) by mouth every 8 (eight) hours as needed for muscle spasms. 30 tablet 1   Multiple Vitamins-Minerals (MULTIVITAMIN GUMMIES WOMENS PO) Take 1 tablet by mouth daily.     pregabalin (LYRICA) 50 MG capsule ONE CAPSULE 3 TIMES A DAY BY MOUTH 90 capsule 5   rosuvastatin (CRESTOR) 5 MG tablet Take 1 tablet (5 mg total) by mouth daily. 90 tablet 3   No current facility-administered medications for this visit.    PAST MEDICAL HISTORY: Past Medical History:  Diagnosis Date   Anxiety    Arthritis    "right shoulder" (10/09/2013)   Asthma    pt denies   B12 deficiency anemia    pt denies   Fibromyalgia     GERD (gastroesophageal reflux disease)    History of blood transfusion 2001   "when I had my stomach cancer"   Hypertension    IBS (irritable bowel syndrome)    Iron deficiency anemia 12/11/2014   Malabsorption of iron 12/11/2014   MS (multiple sclerosis) ([place]) dx'd ~ 1998   Neurogenic bladder    due to MS   Osteopenia    Osteoporosis    PONV (postoperative nausea and vomiting)    Stomach cancer ([place]) 2001   Thyroid nodule    right    PAST SURGICAL HISTORY: Past Surgical History:  Procedure Laterality Date   BIOPSY THYROID Right 09/02/1988   CESAREAN SECTION  1975; 1984   COLONOSCOPY WITH PROPOFOL N/A 05/04/2017   Procedure: COLONOSCOPY WITH PROPOFOL;  Surgeon: Carol Ada, MD;  Location: [facility];  Service: Endoscopy;  Laterality: N/A;   ESOPHAGOGASTRODUODENOSCOPY (EGD) WITH PROPOFOL N/A 05/04/2017   Procedure: ESOPHAGOGASTRODUODENOSCOPY (EGD) WITH PROPOFOL;  Surgeon: Carol Ada, MD;  Location: [facility];  Service: Endoscopy;  Laterality: N/A;   OMENTECTOMY  01/03/1999   partial  PARTIAL GASTRECTOMY  01/03/1999   REVERSE SHOULDER ARTHROPLASTY Right 05/27/2021   Procedure: REVERSE SHOULDER ARTHROPLASTY;  Surgeon: Netta Cedars, MD;  Location: [facility];  Service: Orthopedics;  Laterality: Right;  with ISB   TONSILLECTOMY  ~ [place] Right 08/18/2014   Procedure: TOTAL HIP ARTHROPLASTY ANTERIOR APPROACH;  Surgeon: Rod Can, MD;  Location: [facility];  Service: Orthopedics;  Laterality: Right;   TOTAL SHOULDER ARTHROPLASTY Left 01/02/2009    FAMILY HISTORY: Family History  Problem Relation Age of Onset   Heart disease Mother    Diabetes Father    Stomach cancer Brother    Multiple sclerosis Other    Breast cancer Sister        2 sisters breast cancer    Breast cancer Maternal Aunt     SOCIAL HISTORY: Social History   Socioeconomic History   Marital status: Divorced    Spouse name: Not on file   Number of children: 3    Years  of education: Not on file   Highest education level: Not on file  Occupational History   Occupation: DISABLED    Employer: [employer]  Tobacco Use   Smoking status: Never   Smokeless tobacco: Never   Tobacco comments:    NEVER USED TOBACCO  Vaping Use   Vaping Use: Never used  Substance and Sexual Activity   Alcohol use: No    Alcohol/week: 0.0 standard drinks of alcohol   Drug use: No   Sexual activity: Not Currently  Other Topics Concern   Not on file  Social History Narrative   Patient is retired.    Patient is married with 3 children.   Patient drinks caffeine daily.    Social Determinants of Health   Financial Resource Strain: Not on file  Food Insecurity: Not on file  Transportation Needs: Not on file  Physical Activity: Not on file  Stress: Not on file  Social Connections: Not on file  Intimate Partner Violence: Not on file      Marcial Pacas, M.D. Ph.D.  [facility] [address], [place], [place] [address] Ph: [phone] Fax: [phone]  CC:  Elby Showers, MD [address] [place],  [place] [id number]  Elby Showers, MD

## 2022-02-07 ENCOUNTER — Telehealth: Payer: Self-pay | Admitting: Neurology

## 2022-02-07 NOTE — Telephone Encounter (Signed)
[facility] is taking this patient. 

## 2022-02-09 DIAGNOSIS — M47812 Spondylosis without myelopathy or radiculopathy, cervical region: Secondary | ICD-10-CM | POA: Diagnosis not present

## 2022-02-09 DIAGNOSIS — D509 Iron deficiency anemia, unspecified: Secondary | ICD-10-CM | POA: Diagnosis not present

## 2022-02-09 DIAGNOSIS — M8580 Other specified disorders of bone density and structure, unspecified site: Secondary | ICD-10-CM | POA: Diagnosis not present

## 2022-02-09 DIAGNOSIS — G35 Multiple sclerosis: Secondary | ICD-10-CM | POA: Diagnosis not present

## 2022-02-09 DIAGNOSIS — M810 Age-related osteoporosis without current pathological fracture: Secondary | ICD-10-CM | POA: Diagnosis not present

## 2022-02-09 DIAGNOSIS — D519 Vitamin B12 deficiency anemia, unspecified: Secondary | ICD-10-CM | POA: Diagnosis not present

## 2022-02-09 DIAGNOSIS — J45909 Unspecified asthma, uncomplicated: Secondary | ICD-10-CM | POA: Diagnosis not present

## 2022-02-09 DIAGNOSIS — M797 Fibromyalgia: Secondary | ICD-10-CM | POA: Diagnosis not present

## 2022-02-09 DIAGNOSIS — I10 Essential (primary) hypertension: Secondary | ICD-10-CM | POA: Diagnosis not present

## 2022-02-10 ENCOUNTER — Telehealth: Payer: Self-pay | Admitting: Internal Medicine

## 2022-02-10 ENCOUNTER — Encounter: Payer: Self-pay | Admitting: Internal Medicine

## 2022-02-10 DIAGNOSIS — H524 Presbyopia: Secondary | ICD-10-CM | POA: Diagnosis not present

## 2022-02-10 DIAGNOSIS — H35032 Hypertensive retinopathy, left eye: Secondary | ICD-10-CM | POA: Diagnosis not present

## 2022-02-10 DIAGNOSIS — H25813 Combined forms of age-related cataract, bilateral: Secondary | ICD-10-CM | POA: Diagnosis not present

## 2022-02-10 DIAGNOSIS — H401131 Primary open-angle glaucoma, bilateral, mild stage: Secondary | ICD-10-CM | POA: Diagnosis not present

## 2022-02-10 NOTE — Telephone Encounter (Signed)
Patient's call was returned and I explained to patient the results of the urine dipstick and how not getting a clean catch urine sample can show leukocytes.  I informed patient urine culture results were negative for UTI

## 2022-02-10 NOTE — Telephone Encounter (Signed)
Erika Murphy called and would like for you to call her back and explain these lab results on the leukocytes.

## 2022-02-14 DIAGNOSIS — D519 Vitamin B12 deficiency anemia, unspecified: Secondary | ICD-10-CM | POA: Diagnosis not present

## 2022-02-14 DIAGNOSIS — D509 Iron deficiency anemia, unspecified: Secondary | ICD-10-CM | POA: Diagnosis not present

## 2022-02-14 DIAGNOSIS — J45909 Unspecified asthma, uncomplicated: Secondary | ICD-10-CM | POA: Diagnosis not present

## 2022-02-14 DIAGNOSIS — M810 Age-related osteoporosis without current pathological fracture: Secondary | ICD-10-CM | POA: Diagnosis not present

## 2022-02-14 DIAGNOSIS — M797 Fibromyalgia: Secondary | ICD-10-CM | POA: Diagnosis not present

## 2022-02-14 DIAGNOSIS — G35 Multiple sclerosis: Secondary | ICD-10-CM | POA: Diagnosis not present

## 2022-02-14 DIAGNOSIS — I10 Essential (primary) hypertension: Secondary | ICD-10-CM | POA: Diagnosis not present

## 2022-02-14 DIAGNOSIS — M47812 Spondylosis without myelopathy or radiculopathy, cervical region: Secondary | ICD-10-CM | POA: Diagnosis not present

## 2022-02-14 DIAGNOSIS — M8580 Other specified disorders of bone density and structure, unspecified site: Secondary | ICD-10-CM | POA: Diagnosis not present

## 2022-02-15 DIAGNOSIS — M810 Age-related osteoporosis without current pathological fracture: Secondary | ICD-10-CM | POA: Diagnosis not present

## 2022-02-15 DIAGNOSIS — G35 Multiple sclerosis: Secondary | ICD-10-CM | POA: Diagnosis not present

## 2022-02-15 DIAGNOSIS — J45909 Unspecified asthma, uncomplicated: Secondary | ICD-10-CM | POA: Diagnosis not present

## 2022-02-15 DIAGNOSIS — M8580 Other specified disorders of bone density and structure, unspecified site: Secondary | ICD-10-CM | POA: Diagnosis not present

## 2022-02-15 DIAGNOSIS — M797 Fibromyalgia: Secondary | ICD-10-CM | POA: Diagnosis not present

## 2022-02-15 DIAGNOSIS — D519 Vitamin B12 deficiency anemia, unspecified: Secondary | ICD-10-CM | POA: Diagnosis not present

## 2022-02-15 DIAGNOSIS — M47812 Spondylosis without myelopathy or radiculopathy, cervical region: Secondary | ICD-10-CM | POA: Diagnosis not present

## 2022-02-15 DIAGNOSIS — D509 Iron deficiency anemia, unspecified: Secondary | ICD-10-CM | POA: Diagnosis not present

## 2022-02-15 DIAGNOSIS — I10 Essential (primary) hypertension: Secondary | ICD-10-CM | POA: Diagnosis not present

## 2022-02-17 ENCOUNTER — Telehealth: Payer: Self-pay | Admitting: Neurology

## 2022-02-17 DIAGNOSIS — H00025 Hordeolum internum left lower eyelid: Secondary | ICD-10-CM | POA: Diagnosis not present

## 2022-02-17 NOTE — Telephone Encounter (Signed)
Erika Murphy is call from [facility] stated pt is going to miss PT visit today. Said pt had an eye appointment today. Stated she will reschedule visit towards the end of month.

## 2022-02-20 DIAGNOSIS — M8580 Other specified disorders of bone density and structure, unspecified site: Secondary | ICD-10-CM | POA: Diagnosis not present

## 2022-02-20 DIAGNOSIS — J45909 Unspecified asthma, uncomplicated: Secondary | ICD-10-CM | POA: Diagnosis not present

## 2022-02-20 DIAGNOSIS — G35 Multiple sclerosis: Secondary | ICD-10-CM | POA: Diagnosis not present

## 2022-02-20 DIAGNOSIS — M797 Fibromyalgia: Secondary | ICD-10-CM | POA: Diagnosis not present

## 2022-02-20 DIAGNOSIS — M810 Age-related osteoporosis without current pathological fracture: Secondary | ICD-10-CM | POA: Diagnosis not present

## 2022-02-20 DIAGNOSIS — I10 Essential (primary) hypertension: Secondary | ICD-10-CM | POA: Diagnosis not present

## 2022-02-20 DIAGNOSIS — D519 Vitamin B12 deficiency anemia, unspecified: Secondary | ICD-10-CM | POA: Diagnosis not present

## 2022-02-20 DIAGNOSIS — D509 Iron deficiency anemia, unspecified: Secondary | ICD-10-CM | POA: Diagnosis not present

## 2022-02-20 DIAGNOSIS — M47812 Spondylosis without myelopathy or radiculopathy, cervical region: Secondary | ICD-10-CM | POA: Diagnosis not present

## 2022-02-20 NOTE — Telephone Encounter (Signed)
noted 

## 2022-02-21 DIAGNOSIS — M810 Age-related osteoporosis without current pathological fracture: Secondary | ICD-10-CM | POA: Diagnosis not present

## 2022-02-21 DIAGNOSIS — M797 Fibromyalgia: Secondary | ICD-10-CM | POA: Diagnosis not present

## 2022-02-21 DIAGNOSIS — M47812 Spondylosis without myelopathy or radiculopathy, cervical region: Secondary | ICD-10-CM | POA: Diagnosis not present

## 2022-02-21 DIAGNOSIS — M8580 Other specified disorders of bone density and structure, unspecified site: Secondary | ICD-10-CM | POA: Diagnosis not present

## 2022-02-21 DIAGNOSIS — J45909 Unspecified asthma, uncomplicated: Secondary | ICD-10-CM | POA: Diagnosis not present

## 2022-02-21 DIAGNOSIS — I10 Essential (primary) hypertension: Secondary | ICD-10-CM | POA: Diagnosis not present

## 2022-02-21 DIAGNOSIS — D519 Vitamin B12 deficiency anemia, unspecified: Secondary | ICD-10-CM | POA: Diagnosis not present

## 2022-02-21 DIAGNOSIS — G35 Multiple sclerosis: Secondary | ICD-10-CM | POA: Diagnosis not present

## 2022-02-21 DIAGNOSIS — D509 Iron deficiency anemia, unspecified: Secondary | ICD-10-CM | POA: Diagnosis not present

## 2022-02-27 ENCOUNTER — Ambulatory Visit
Admission: RE | Admit: 2022-02-27 | Discharge: 2022-02-27 | Disposition: A | Discharge status: Home / Self Care | Payer: Medicare HMO | Source: Ambulatory Visit | Attending: Internal Medicine | Admitting: Internal Medicine

## 2022-02-27 ENCOUNTER — Ambulatory Visit (INDEPENDENT_AMBULATORY_CARE_PROVIDER_SITE_OTHER): Payer: Medicare HMO | Admitting: Internal Medicine

## 2022-02-27 ENCOUNTER — Telehealth: Payer: Self-pay | Admitting: Internal Medicine

## 2022-02-27 ENCOUNTER — Encounter: Payer: Self-pay | Admitting: Internal Medicine

## 2022-02-27 VITALS — BP 116/72 | HR 78 | Temp 98.9°F | Ht 64.0 in | Wt 86.8 lb

## 2022-02-27 DIAGNOSIS — Z903 Acquired absence of stomach [part of]: Secondary | ICD-10-CM

## 2022-02-27 DIAGNOSIS — R109 Unspecified abdominal pain: Secondary | ICD-10-CM

## 2022-02-27 DIAGNOSIS — G35 Multiple sclerosis: Secondary | ICD-10-CM | POA: Diagnosis not present

## 2022-02-27 DIAGNOSIS — Z85028 Personal history of other malignant neoplasm of stomach: Secondary | ICD-10-CM

## 2022-02-27 MED ORDER — MELOXICAM 7.5 MG PO TABS: 7.5000 mg | ORAL_TABLET | Freq: Every day | ORAL | 0 refills | Status: DC

## 2022-02-27 NOTE — Progress Notes (Signed)
Patient Care Team: Elby Showers, MD as PCP - General (Internal Medicine) Lorretta Harp, MD as PCP - Cardiology (Cardiology) [name], MD as Consulting Physician (Oncology)  Visit Date: 02/27/22  Subjective:    Patient ID: Erika Murphy , Female   DOB: Oct 23, 1950, 72 y.o.    MRN: [id number]   71 y.o. Female presents today for abdominal pain. Patient has a past medical history of anxiety, arthritis, asthma, B12 deficiency anemia, fibromyalgia, GERD, hypertension, IBS, iron deficiency anemia, multiple sclerosis, neurogenic bladder, osteopenia, osteoporosis, stomach cancer, thyroid nodule, right shoulder replacement.  History of gastric cancer, status post gastrectomy, has not been on long-term immunomodulation therapy for many years. Had similar symptoms this time last year. Was sent to GI doctor, scoped and found nothing. Reports constant worsening pain and protrusion on left abdomen for one week. Pain is reducing sleep, appetite. Denies constipation. Denies any strenuous movements, pushing, pulling, lifting, fever, chills, urinary symptoms. Normal bowel movement this morning and recent days. Has been doing at-home physical therapy. Had mild nausea since 02/25/22. Has mild lower back pain.  05/04/2017 colonoscopy found three 3-4 mm polyps in proximal descending colon, removed. Repeat in 3-5 years. 03/02/20 upper GI endoscopy showed normal esophagus, patent Billroth II gastrojejunostomy, characterized by healthy appearing mucosa. Followed by gastroenterologist, Dr. Benson Norway.  02/27/22 abdominal X-ray showed gas pattern is normal. No evidence of ileus, obstruction or free air. Surgical clips in the gastric region. No abnormal calcifications. Ordinary mild degenerative changes affect the lumbar spine. Previous hip replacement on the right. Lung bases appear clear.   Past Medical History:  Diagnosis Date   Anxiety    Arthritis    "right shoulder" (10/09/2013)   Asthma    pt denies   B12  deficiency anemia    pt denies   Fibromyalgia    GERD (gastroesophageal reflux disease)    History of blood transfusion 2001   "when I had my stomach cancer"   Hypertension    IBS (irritable bowel syndrome)    Iron deficiency anemia 12/11/2014   Malabsorption of iron 12/11/2014   MS (multiple sclerosis) ([place]) dx'd ~ 1998   Neurogenic bladder    due to MS   Osteopenia    Osteoporosis    PONV (postoperative nausea and vomiting)    Stomach cancer ([place]) 2001   Thyroid nodule    right     Family History  Problem Relation Age of Onset   Heart disease Mother    Diabetes Father    Stomach cancer Brother    Multiple sclerosis Other    Breast cancer Sister        2 sisters breast cancer    Breast cancer Maternal Aunt     Social History   Social History Narrative   Patient is retired.    Patient is married with 3 children.   Patient drinks caffeine daily.       Review of Systems  Constitutional:  Negative for fever and malaise/fatigue.  HENT:  Negative for congestion.   Eyes:  Negative for blurred vision.  Respiratory:  Negative for cough and shortness of breath.   Cardiovascular:  Negative for chest pain, palpitations and leg swelling.  Gastrointestinal:  Positive for abdominal pain (Left) and nausea (Mild). Negative for vomiting.  Musculoskeletal:  Negative for back pain.  Skin:  Negative for rash.  Neurological:  Negative for loss of consciousness and headaches.        Objective:   Vitals:  BP 116/72   Pulse 78   Temp 98.9 F (37.2 C) (Tympanic)   Ht '5\' 4"'$  (1.626 m)   Wt 86 lb 12.8 oz (39.4 kg)   SpO2 99%   BMI 14.90 kg/m    Physical Exam Vitals and nursing note reviewed.  Constitutional:      General: She is not in acute distress.    Appearance: Normal appearance. She is not toxic-appearing.  HENT:     Head: Normocephalic and atraumatic.  Pulmonary:     Effort: Pulmonary effort is normal. No respiratory distress.     Breath sounds: No wheezing or  rales.  Abdominal:     General: Abdomen is scaphoid. Bowel sounds are normal. There is no distension or abdominal bruit.     Palpations: There is no hepatomegaly or splenomegaly.     Tenderness: There is abdominal tenderness in the left upper quadrant.     Comments: Scaphoid abdomen.  Skin:    General: Skin is warm and dry.  Neurological:     Mental Status: She is alert and oriented to person, place, and time. Mental status is at baseline.  Psychiatric:        Mood and Affect: Mood normal.        Behavior: Behavior normal.        Thought Content: Thought content normal.        Judgment: Judgment normal.       Results:   Studies obtained and personally reviewed by me:  05/04/2017 colonoscopy found three 3-4 mm polyps in proximal descending colon, removed. Repeat in 3-5 years. 03/02/20 upper GI endoscopy showed normal esophagus, patent Billroth II gastrojejunostomy, characterized by healthy appearing mucosa. Followed by gastroenterologist, Dr. Benson Norway.  02/27/22 abdominal X-ray showed gas pattern is normal. No evidence of ileus, obstruction or free air. Surgical clips in the gastric region. No abnormal calcifications. Ordinary mild degenerative changes affect the lumbar spine. Previous hip replacement on the right. Lung bases appear clear.   Labs:       Component Value Date/Time   NA 144 01/27/2022 0921   NA 144 06/12/2016 1151   NA 142 06/11/2015 1156   K 4.4 01/27/2022 0921   K 4.7 06/12/2016 1151   K 4.3 06/11/2015 1156   CL 108 01/27/2022 0921   CL 107 06/12/2016 1151   CO2 27 01/27/2022 0921   CO2 32 06/12/2016 1151   CO2 29 06/11/2015 1156   GLUCOSE 85 01/27/2022 0921   GLUCOSE 88 06/12/2016 1151   BUN 17 01/27/2022 0921   BUN 11 06/12/2016 1151   BUN 13.5 06/11/2015 1156   CREATININE 0.93 01/27/2022 0921   CREATININE 1.2 (H) 06/11/2015 1156   CALCIUM 9.2 01/27/2022 0921   CALCIUM 9.6 06/12/2016 1151   CALCIUM 9.7 06/11/2015 1156   PROT 7.1 01/27/2022 0921   PROT 7.5  06/12/2016 1151   PROT 7.5 06/11/2015 1156   ALBUMIN 4.1 01/13/2022 1020   ALBUMIN 3.8 06/12/2016 1151   ALBUMIN 3.8 06/11/2015 1156   AST 32 01/27/2022 0921   AST 26 01/13/2022 1020   AST 30 06/11/2015 1156   ALT 21 01/27/2022 0921   ALT 15 01/13/2022 1020   ALT 26 06/12/2016 1151   ALT 23 06/11/2015 1156   ALKPHOS 63 01/13/2022 1020   ALKPHOS 97 (H) 06/12/2016 1151   ALKPHOS 111 06/11/2015 1156   BILITOT 0.6 01/27/2022 0921   BILITOT 0.6 01/13/2022 1020   BILITOT 1.03 06/11/2015 1156   GFRNONAA 58 (L) 01/13/2022  [place] 01/16/2020 0949   GFRAA 85 01/16/2020 0949     Lab Results  Component Value Date   WBC 2.5 (L) 01/27/2022   HGB 11.5 (L) 01/27/2022   HCT 34.9 (L) 01/27/2022   MCV 91.1 01/27/2022   PLT 156 01/27/2022    Lab Results  Component Value Date   CHOL 172 01/27/2022   HDL 57 01/27/2022   LDLCALC 95 01/27/2022   TRIG 106 01/27/2022   CHOLHDL 3.0 01/27/2022    Lab Results  Component Value Date   HGBA1C 5.0 05/13/2021     Lab Results  Component Value Date   TSH 2.00 01/27/2022    Assessment & Plan:   Left Abdominal Pain: Having pain in left abdomen for past week. Prescribed Mobic 7.5 mg daily. Ordered abdomen Xr, showed no obstruction.  Will come back for tetanus, pneumococcal 20 vaccines.    I,Alexander Ruley,acting as a Education administrator for Elby Showers, MD.,have documented all relevant documentation on the behalf of Elby Showers, MD,as directed by  Elby Showers, MD while in the presence of Elby Showers, MD.   I, Elby Showers, MD, have reviewed all documentation for this visit. The documentation on 02/27/22 for the exam, diagnosis, procedures, and orders are all accurate and complete.

## 2022-02-27 NOTE — Telephone Encounter (Signed)
Erika Murphy [phone]  Erika Murphy called to say she has Hard place in stomach area around navel area, this weekend it hurt and she was not able to eat, she would like to come in for you to look at it today.

## 2022-02-27 NOTE — Patient Instructions (Addendum)
KUB done today is normal. Eat light for a few days. Please call Dr. Benson Norway for follow up as planned from his last visit with you.

## 2022-02-27 NOTE — Telephone Encounter (Signed)
scheduled

## 2022-02-28 DIAGNOSIS — M810 Age-related osteoporosis without current pathological fracture: Secondary | ICD-10-CM | POA: Diagnosis not present

## 2022-02-28 DIAGNOSIS — D519 Vitamin B12 deficiency anemia, unspecified: Secondary | ICD-10-CM | POA: Diagnosis not present

## 2022-02-28 DIAGNOSIS — M8580 Other specified disorders of bone density and structure, unspecified site: Secondary | ICD-10-CM | POA: Diagnosis not present

## 2022-02-28 DIAGNOSIS — I10 Essential (primary) hypertension: Secondary | ICD-10-CM | POA: Diagnosis not present

## 2022-02-28 DIAGNOSIS — G35 Multiple sclerosis: Secondary | ICD-10-CM | POA: Diagnosis not present

## 2022-02-28 DIAGNOSIS — M797 Fibromyalgia: Secondary | ICD-10-CM | POA: Diagnosis not present

## 2022-02-28 DIAGNOSIS — M47812 Spondylosis without myelopathy or radiculopathy, cervical region: Secondary | ICD-10-CM | POA: Diagnosis not present

## 2022-02-28 DIAGNOSIS — D509 Iron deficiency anemia, unspecified: Secondary | ICD-10-CM | POA: Diagnosis not present

## 2022-02-28 DIAGNOSIS — J45909 Unspecified asthma, uncomplicated: Secondary | ICD-10-CM | POA: Diagnosis not present

## 2022-02-28 NOTE — Telephone Encounter (Signed)
Faxed home health forms to [facility]

## 2022-02-28 NOTE — Telephone Encounter (Signed)
Crutches order also faxed to [facility]

## 2022-03-02 NOTE — Telephone Encounter (Signed)
Home health orders faxed

## 2022-03-06 DIAGNOSIS — M47812 Spondylosis without myelopathy or radiculopathy, cervical region: Secondary | ICD-10-CM | POA: Diagnosis not present

## 2022-03-06 DIAGNOSIS — M8580 Other specified disorders of bone density and structure, unspecified site: Secondary | ICD-10-CM | POA: Diagnosis not present

## 2022-03-06 DIAGNOSIS — J45909 Unspecified asthma, uncomplicated: Secondary | ICD-10-CM | POA: Diagnosis not present

## 2022-03-06 DIAGNOSIS — I10 Essential (primary) hypertension: Secondary | ICD-10-CM | POA: Diagnosis not present

## 2022-03-06 DIAGNOSIS — D519 Vitamin B12 deficiency anemia, unspecified: Secondary | ICD-10-CM | POA: Diagnosis not present

## 2022-03-06 DIAGNOSIS — G35 Multiple sclerosis: Secondary | ICD-10-CM | POA: Diagnosis not present

## 2022-03-06 DIAGNOSIS — D509 Iron deficiency anemia, unspecified: Secondary | ICD-10-CM | POA: Diagnosis not present

## 2022-03-06 DIAGNOSIS — M797 Fibromyalgia: Secondary | ICD-10-CM | POA: Diagnosis not present

## 2022-03-06 DIAGNOSIS — M810 Age-related osteoporosis without current pathological fracture: Secondary | ICD-10-CM | POA: Diagnosis not present

## 2022-03-07 ENCOUNTER — Other Ambulatory Visit: Payer: Self-pay | Admitting: Internal Medicine

## 2022-03-07 DIAGNOSIS — Z1231 Encounter for screening mammogram for malignant neoplasm of breast: Secondary | ICD-10-CM

## 2022-03-08 DIAGNOSIS — D509 Iron deficiency anemia, unspecified: Secondary | ICD-10-CM | POA: Diagnosis not present

## 2022-03-08 DIAGNOSIS — J45909 Unspecified asthma, uncomplicated: Secondary | ICD-10-CM | POA: Diagnosis not present

## 2022-03-08 DIAGNOSIS — G35 Multiple sclerosis: Secondary | ICD-10-CM | POA: Diagnosis not present

## 2022-03-08 DIAGNOSIS — M8580 Other specified disorders of bone density and structure, unspecified site: Secondary | ICD-10-CM | POA: Diagnosis not present

## 2022-03-08 DIAGNOSIS — M810 Age-related osteoporosis without current pathological fracture: Secondary | ICD-10-CM | POA: Diagnosis not present

## 2022-03-08 DIAGNOSIS — I10 Essential (primary) hypertension: Secondary | ICD-10-CM | POA: Diagnosis not present

## 2022-03-08 DIAGNOSIS — M797 Fibromyalgia: Secondary | ICD-10-CM | POA: Diagnosis not present

## 2022-03-08 DIAGNOSIS — M47812 Spondylosis without myelopathy or radiculopathy, cervical region: Secondary | ICD-10-CM | POA: Diagnosis not present

## 2022-03-08 DIAGNOSIS — D519 Vitamin B12 deficiency anemia, unspecified: Secondary | ICD-10-CM | POA: Diagnosis not present

## 2022-03-16 DIAGNOSIS — M810 Age-related osteoporosis without current pathological fracture: Secondary | ICD-10-CM | POA: Diagnosis not present

## 2022-03-16 DIAGNOSIS — G35 Multiple sclerosis: Secondary | ICD-10-CM | POA: Diagnosis not present

## 2022-03-16 DIAGNOSIS — I10 Essential (primary) hypertension: Secondary | ICD-10-CM | POA: Diagnosis not present

## 2022-03-16 DIAGNOSIS — D519 Vitamin B12 deficiency anemia, unspecified: Secondary | ICD-10-CM | POA: Diagnosis not present

## 2022-03-16 DIAGNOSIS — D509 Iron deficiency anemia, unspecified: Secondary | ICD-10-CM | POA: Diagnosis not present

## 2022-03-16 DIAGNOSIS — M797 Fibromyalgia: Secondary | ICD-10-CM | POA: Diagnosis not present

## 2022-03-16 DIAGNOSIS — J45909 Unspecified asthma, uncomplicated: Secondary | ICD-10-CM | POA: Diagnosis not present

## 2022-03-16 DIAGNOSIS — M47812 Spondylosis without myelopathy or radiculopathy, cervical region: Secondary | ICD-10-CM | POA: Diagnosis not present

## 2022-03-16 DIAGNOSIS — M8580 Other specified disorders of bone density and structure, unspecified site: Secondary | ICD-10-CM | POA: Diagnosis not present

## 2022-03-21 DIAGNOSIS — M810 Age-related osteoporosis without current pathological fracture: Secondary | ICD-10-CM | POA: Diagnosis not present

## 2022-03-21 DIAGNOSIS — I10 Essential (primary) hypertension: Secondary | ICD-10-CM | POA: Diagnosis not present

## 2022-03-21 DIAGNOSIS — D519 Vitamin B12 deficiency anemia, unspecified: Secondary | ICD-10-CM | POA: Diagnosis not present

## 2022-03-21 DIAGNOSIS — M797 Fibromyalgia: Secondary | ICD-10-CM | POA: Diagnosis not present

## 2022-03-21 DIAGNOSIS — D509 Iron deficiency anemia, unspecified: Secondary | ICD-10-CM | POA: Diagnosis not present

## 2022-03-21 DIAGNOSIS — M8580 Other specified disorders of bone density and structure, unspecified site: Secondary | ICD-10-CM | POA: Diagnosis not present

## 2022-03-21 DIAGNOSIS — J45909 Unspecified asthma, uncomplicated: Secondary | ICD-10-CM | POA: Diagnosis not present

## 2022-03-21 DIAGNOSIS — M47812 Spondylosis without myelopathy or radiculopathy, cervical region: Secondary | ICD-10-CM | POA: Diagnosis not present

## 2022-03-21 DIAGNOSIS — G35 Multiple sclerosis: Secondary | ICD-10-CM | POA: Diagnosis not present

## 2022-03-29 DIAGNOSIS — Z86010 Personal history of colonic polyps: Secondary | ICD-10-CM | POA: Diagnosis not present

## 2022-03-29 DIAGNOSIS — K219 Gastro-esophageal reflux disease without esophagitis: Secondary | ICD-10-CM | POA: Diagnosis not present

## 2022-03-29 DIAGNOSIS — R1031 Right lower quadrant pain: Secondary | ICD-10-CM | POA: Diagnosis not present

## 2022-03-29 DIAGNOSIS — R1032 Left lower quadrant pain: Secondary | ICD-10-CM | POA: Diagnosis not present

## 2022-04-06 DIAGNOSIS — D509 Iron deficiency anemia, unspecified: Secondary | ICD-10-CM | POA: Diagnosis not present

## 2022-04-06 DIAGNOSIS — J45909 Unspecified asthma, uncomplicated: Secondary | ICD-10-CM | POA: Diagnosis not present

## 2022-04-06 DIAGNOSIS — M8580 Other specified disorders of bone density and structure, unspecified site: Secondary | ICD-10-CM | POA: Diagnosis not present

## 2022-04-06 DIAGNOSIS — G35 Multiple sclerosis: Secondary | ICD-10-CM | POA: Diagnosis not present

## 2022-04-06 DIAGNOSIS — M47812 Spondylosis without myelopathy or radiculopathy, cervical region: Secondary | ICD-10-CM | POA: Diagnosis not present

## 2022-04-06 DIAGNOSIS — D519 Vitamin B12 deficiency anemia, unspecified: Secondary | ICD-10-CM | POA: Diagnosis not present

## 2022-04-06 DIAGNOSIS — M797 Fibromyalgia: Secondary | ICD-10-CM | POA: Diagnosis not present

## 2022-04-06 DIAGNOSIS — M810 Age-related osteoporosis without current pathological fracture: Secondary | ICD-10-CM | POA: Diagnosis not present

## 2022-04-06 DIAGNOSIS — I10 Essential (primary) hypertension: Secondary | ICD-10-CM | POA: Diagnosis not present

## 2022-04-07 ENCOUNTER — Other Ambulatory Visit: Payer: Self-pay

## 2022-04-07 ENCOUNTER — Encounter: Payer: Self-pay | Admitting: Hematology & Oncology

## 2022-04-07 ENCOUNTER — Inpatient Hospital Stay: Payer: Medicare HMO | Attending: Hematology & Oncology

## 2022-04-07 ENCOUNTER — Other Ambulatory Visit: Payer: Self-pay | Admitting: Internal Medicine

## 2022-04-07 ENCOUNTER — Inpatient Hospital Stay: Payer: Medicare HMO | Admitting: Hematology & Oncology

## 2022-04-07 VITALS — BP 133/50 | HR 70 | Temp 97.5°F | Resp 18 | Ht 64.0 in | Wt 89.0 lb

## 2022-04-07 DIAGNOSIS — Z85028 Personal history of other malignant neoplasm of stomach: Secondary | ICD-10-CM | POA: Insufficient documentation

## 2022-04-07 DIAGNOSIS — D509 Iron deficiency anemia, unspecified: Secondary | ICD-10-CM | POA: Diagnosis not present

## 2022-04-07 DIAGNOSIS — D002 Carcinoma in situ of stomach: Secondary | ICD-10-CM

## 2022-04-07 DIAGNOSIS — K909 Intestinal malabsorption, unspecified: Secondary | ICD-10-CM

## 2022-04-07 DIAGNOSIS — D510 Vitamin B12 deficiency anemia due to intrinsic factor deficiency: Secondary | ICD-10-CM | POA: Diagnosis not present

## 2022-04-07 DIAGNOSIS — D500 Iron deficiency anemia secondary to blood loss (chronic): Secondary | ICD-10-CM | POA: Diagnosis not present

## 2022-04-07 DIAGNOSIS — R634 Abnormal weight loss: Secondary | ICD-10-CM | POA: Insufficient documentation

## 2022-04-07 DIAGNOSIS — E538 Deficiency of other specified B group vitamins: Secondary | ICD-10-CM

## 2022-04-07 DIAGNOSIS — G35 Multiple sclerosis: Secondary | ICD-10-CM | POA: Diagnosis not present

## 2022-04-07 LAB — RETICULOCYTES
Immature Retic Fract: 8.8 % (ref 2.3–15.9)
RBC.: 3.9 MIL/uL (ref 3.87–5.11)
Retic Count, Absolute: 48.4 10*3/uL (ref 19.0–186.0)
Retic Ct Pct: 1.2 % (ref 0.4–3.1)

## 2022-04-07 LAB — CMP (CANCER CENTER ONLY)
ALT: 23 U/L (ref 0–44)
AST: 31 U/L (ref 15–41)
Albumin: 3.9 g/dL (ref 3.5–5.0)
Alkaline Phosphatase: 65 U/L (ref 38–126)
Anion gap: 8 (ref 5–15)
BUN: 17 mg/dL (ref 8–23)
CO2: 28 mmol/L (ref 22–32)
Calcium: 9 mg/dL (ref 8.9–10.3)
Chloride: 104 mmol/L (ref 98–111)
Creatinine: 0.99 mg/dL (ref 0.44–1.00)
GFR, Estimated: >60 mL/min (ref >60)
Glucose, Bld: 96 mg/dL (ref 70–99)
Potassium: 4 mmol/L (ref 3.5–5.1)
Sodium: 140 mmol/L (ref 135–145)
Total Bilirubin: 1 mg/dL (ref 0.3–1.2)
Total Protein: 7.7 g/dL (ref 6.5–8.1)

## 2022-04-07 LAB — CBC WITH DIFFERENTIAL (CANCER CENTER ONLY)
Abs Immature Granulocytes: 0.03 10*3/uL (ref 0.00–0.07)
Basophils Absolute: 0 10*3/uL (ref 0.0–0.1)
Basophils Relative: 1 %
Eosinophils Absolute: 0.1 10*3/uL (ref 0.0–0.5)
Eosinophils Relative: 3 %
HCT: 36.4 % (ref 36.0–46.0)
Hemoglobin: 11.8 g/dL — ABNORMAL LOW (ref 12.0–15.0)
Immature Granulocytes: 1 %
Lymphocytes Relative: 49 %
Lymphs Abs: 1.3 10*3/uL (ref 0.7–4.0)
MCH: 30.2 pg (ref 26.0–34.0)
MCHC: 32.4 g/dL (ref 30.0–36.0)
MCV: 93.1 fL (ref 80.0–100.0)
Monocytes Absolute: 0.3 10*3/uL (ref 0.1–1.0)
Monocytes Relative: 11 %
Neutro Abs: 0.9 10*3/uL — ABNORMAL LOW (ref 1.7–7.7)
Neutrophils Relative %: 35 %
Platelet Count: 156 10*3/uL (ref 150–400)
RBC: 3.91 MIL/uL (ref 3.87–5.11)
RDW: 12.1 % (ref 11.5–15.5)
WBC Count: 2.7 10*3/uL — ABNORMAL LOW (ref 4.0–10.5)
nRBC: 0 % (ref 0.0–0.2)

## 2022-04-07 LAB — FERRITIN: Ferritin: 24 ng/mL (ref 11–307)

## 2022-04-07 LAB — IRON AND IRON BINDING CAPACITY (CC-WL,HP ONLY)
Iron: 164 ug/dL (ref 28–170)
Saturation Ratios: 38 % — ABNORMAL HIGH (ref 10.4–31.8)
TIBC: 435 ug/dL (ref 250–450)
UIBC: 271 ug/dL (ref 148–442)

## 2022-04-07 LAB — LACTATE DEHYDROGENASE: LDH: 144 U/L (ref 98–192)

## 2022-04-07 NOTE — Progress Notes (Signed)
Hematology and Oncology Follow Up Visit  Erika Murphy [id number] 12/14/1950 72 y.o. 04/07/2022   Principle Diagnosis:  Stage II (T3N0M0) adenocarcinoma of the stomach-remission Recurrent iron deficiency anemia  Pernicious anemia Multiple sclerosis Current Therapy:   IV iron as indicated -- Venofer given on 04/2019 Vitamin B-12 1 mg IM every month --done at home Prolia 60 mg IM q 6 months -- next dose in 05/2022     Interim History:  [name] is back for follow-up.  Her main problem has been the right shoulder.  She had shoulder surgery 6 months ago.  Unfortunate, she still has very limited range of motion of the shoulder.  I really hate this for her.  She is not sure she wants to have any additional surgery done.  She has completed her physical therapy.  She has had no problems with nausea or vomiting.  She has had no flareups of the multiple sclerosis.  She does use cane to get around that helps stabilize her.  She I am a little worried about Erika Murphy.  She has lost weight.  Her weight is down to 84 pounds.  Is hard to tell how much she is eating.  She had iron studies that were done back in January.  Her ferritin was 37 with an iron saturation of 23%.  She is eating a little bit better.  Her weight is gone up a little bit.  She has had no issues with her bowels or bladder.  Overall, I would say that her performance status is probably ECOG 02.   Medications:  Current Outpatient Medications:    albuterol (VENTOLIN HFA) 108 (90 Base) MCG/ACT inhaler, INHALE 2 PUFFS INTO THE LUNGS EVERY 6 HOURS AS NEEDED FOR SHORTNESS OF BREATH/WHEEZING, Disp: 8.5 each, Rfl: 11   ALPRAZolam (XANAX) 0.5 MG tablet, Take one half tab twice daily as needed for anxiety., Disp: 60 tablet, Rfl: 1   amLODipine (NORVASC) 2.5 MG tablet, TAKE 1 TABLET BY MOUTH EVERY DAY, Disp: 90 tablet, Rfl: 3   cyanocobalamin (VITAMIN B12) 1000 MCG/ML injection, INJECT 1 ML (1,000 MCG TOTAL) INTO THE MUSCLE ONCE A MONTH AS  DIRECTED, Disp: 3 mL, Rfl: 3   denosumab (PROLIA) 60 MG/ML SOSY injection, Inject 60 mg into the skin every 6 (six) months., Disp: , Rfl:    esomeprazole (NEXIUM) 40 MG capsule, TAKE 1 CAPSULE (40 MG TOTAL) BY MOUTH DAILY AT 12 NOON., Disp: 90 capsule, Rfl: 3   meclizine (ANTIVERT) 25 MG tablet, Take 1 tablet (25 mg total) by mouth 3 (three) times daily as needed for dizziness., Disp: 30 tablet, Rfl: 0   meloxicam (MOBIC) 7.5 MG tablet, Take 1 tablet (7.5 mg total) by mouth daily., Disp: 12 tablet, Rfl: 0   methocarbamol (ROBAXIN) 500 MG tablet, Take 1 tablet (500 mg total) by mouth every 8 (eight) hours as needed for muscle spasms., Disp: 30 tablet, Rfl: 1   Multiple Vitamins-Minerals (MULTIVITAMIN GUMMIES WOMENS PO), Take 1 tablet by mouth daily., Disp: , Rfl:    pregabalin (LYRICA) 50 MG capsule, ONE CAPSULE 3 TIMES A DAY BY MOUTH, Disp: 90 capsule, Rfl: 5   rosuvastatin (CRESTOR) 5 MG tablet, Take 1 tablet (5 mg total) by mouth daily., Disp: 90 tablet, Rfl: 3  Allergies: No Known Allergies  Past Medical History, Surgical history, Social history, and Family History were reviewed and updated.  Review of Systems: Review of Systems  Constitutional:  Positive for malaise/fatigue.  HENT:  Positive for tinnitus.  Eyes:  Positive for blurred vision.  Respiratory: Negative.    Cardiovascular: Negative.   Gastrointestinal:  Positive for diarrhea.  Genitourinary: Negative.   Musculoskeletal:  Positive for myalgias.  Skin: Negative.   Neurological:  Positive for tingling and weakness.  Endo/Heme/Allergies: Negative.   Psychiatric/Behavioral: Negative.      Physical Exam:  height is 5\' 4"  (1.626 m) and weight is 89 lb (40.4 kg). Her oral temperature is 97.5 F (36.4 C) (abnormal). Her blood pressure is 133/50 (abnormal) and her pulse is 70. Her respiration is 18 and oxygen saturation is 100%.   Physical Exam Vitals reviewed.  Constitutional:      Comments: Very thin African-American  female.  She does have some temporal muscle wasting.  There is no adenopathy.  There is no rash.  She has had no palpable liver or spleen.  HENT:     Head: Normocephalic and atraumatic.  Eyes:     Pupils: Pupils are equal, round, and reactive to light.  Cardiovascular:     Rate and Rhythm: Normal rate and regular rhythm.     Heart sounds: Normal heart sounds.  Pulmonary:     Effort: Pulmonary effort is normal.     Breath sounds: Normal breath sounds.  Abdominal:     General: Bowel sounds are normal.     Palpations: Abdomen is soft.  Musculoskeletal:        General: No tenderness or deformity. Normal range of motion.     Cervical back: Normal range of motion.     Comments: She has symmetric muscle atrophy in upper and lower extremities.  She has marked limited range of motion of the right shoulder.  Lymphadenopathy:     Cervical: No cervical adenopathy.  Skin:    General: Skin is warm and dry.     Findings: No erythema or rash.  Neurological:     Mental Status: She is alert and oriented to person, place, and time.  Psychiatric:        Behavior: Behavior normal.        Thought Content: Thought content normal.        Judgment: Judgment normal.      Lab Results  Component Value Date   WBC 2.7 (L) 04/07/2022   HGB 11.8 (L) 04/07/2022   HCT 36.4 04/07/2022   MCV 93.1 04/07/2022   PLT 156 04/07/2022     Chemistry      Component Value Date/Time   NA 140 04/07/2022 0949   NA 144 06/12/2016 1151   NA 142 06/11/2015 1156   K 4.0 04/07/2022 0949   K 4.7 06/12/2016 1151   K 4.3 06/11/2015 1156   CL 104 04/07/2022 0949   CL 107 06/12/2016 1151   CO2 28 04/07/2022 0949   CO2 32 06/12/2016 1151   CO2 29 06/11/2015 1156   BUN 17 04/07/2022 0949   BUN 11 06/12/2016 1151   BUN 13.5 06/11/2015 1156   CREATININE 0.99 04/07/2022 0949   CREATININE 0.93 01/27/2022 0921   CREATININE 1.2 (H) 06/11/2015 1156      Component Value Date/Time   CALCIUM 9.0 04/07/2022 0949   CALCIUM  9.6 06/12/2016 1151   CALCIUM 9.7 06/11/2015 1156   ALKPHOS 65 04/07/2022 0949   ALKPHOS 97 (H) 06/12/2016 1151   ALKPHOS 111 06/11/2015 1156   AST 31 04/07/2022 0949   AST 30 06/11/2015 1156   ALT 23 04/07/2022 0949   ALT 26 06/12/2016 1151   ALT 23 06/11/2015 1156  BILITOT 1.0 04/07/2022 0949   BILITOT 1.03 06/11/2015 1156      Impression and Plan: Ms. Naatz is a 72 year old African [place] female. She had a history of stage II stomach cancer. She underwent resection. She had adjuvant therapy.  She  is now over 22 years out from treatment. She is cured of the stomach cancer.   I realize that she has other health issues.  The multiple sclerosis is probably the biggest long-term issue for her.  Currently, the issue with the right shoulder is a problem.  We will follow-up with her iron levels.  She has her vitamin B12 at home.  Her last vitamin B12 level back in November was 384.  We do do Prolia.  We will probably have her do Prolia when we see her back.  Hopefully, her weight will continue to improve.   Josph Macho, MD 4/5/202410:52 AM

## 2022-04-19 ENCOUNTER — Telehealth: Payer: Self-pay | Admitting: Internal Medicine

## 2022-04-19 NOTE — Telephone Encounter (Signed)
Prolia prior Authorization needed from [facility]  Patient has new insurance and needs PA, her next Prolia injection is due in May. The [employer] Summary of Benefits is under Media in Epic

## 2022-04-20 NOTE — Telephone Encounter (Signed)
Pharmacy Patient Advocate Encounter   Received notification that prior authorization for Prolia is required/requested.   PA submitted on 04/20/22 to (ins) [employer] via [employer] # [id number] Status is pending

## 2022-04-21 NOTE — Telephone Encounter (Signed)
PA approved.

## 2022-04-28 ENCOUNTER — Ambulatory Visit
Admission: RE | Admit: 2022-04-28 | Discharge: 2022-04-28 | Disposition: A | Discharge status: Home / Self Care | Payer: Medicare HMO | Source: Ambulatory Visit | Attending: Internal Medicine | Admitting: Internal Medicine

## 2022-04-28 DIAGNOSIS — Z1231 Encounter for screening mammogram for malignant neoplasm of breast: Secondary | ICD-10-CM

## 2022-05-11 NOTE — Telephone Encounter (Signed)
LVM to CB and schedule Prolia shot

## 2022-05-12 ENCOUNTER — Ambulatory Visit: Payer: Medicare HMO

## 2022-05-16 ENCOUNTER — Encounter: Payer: Self-pay | Admitting: Internal Medicine

## 2022-05-16 DIAGNOSIS — Z86010 Personal history of colonic polyps: Secondary | ICD-10-CM | POA: Diagnosis not present

## 2022-05-16 DIAGNOSIS — Z1211 Encounter for screening for malignant neoplasm of colon: Secondary | ICD-10-CM | POA: Diagnosis not present

## 2022-05-16 DIAGNOSIS — Z09 Encounter for follow-up examination after completed treatment for conditions other than malignant neoplasm: Secondary | ICD-10-CM | POA: Diagnosis not present

## 2022-05-16 DIAGNOSIS — Q438 Other specified congenital malformations of intestine: Secondary | ICD-10-CM | POA: Diagnosis not present

## 2022-05-16 LAB — HM COLONOSCOPY

## 2022-05-19 ENCOUNTER — Ambulatory Visit: Payer: Medicare HMO

## 2022-05-22 ENCOUNTER — Ambulatory Visit (INDEPENDENT_AMBULATORY_CARE_PROVIDER_SITE_OTHER): Payer: Medicare HMO

## 2022-05-22 VITALS — BP 122/78 | HR 64 | Temp 98.0°F | Ht 64.0 in | Wt 88.0 lb

## 2022-05-22 DIAGNOSIS — M810 Age-related osteoporosis without current pathological fracture: Secondary | ICD-10-CM | POA: Diagnosis not present

## 2022-05-22 MED ORDER — DENOSUMAB 60 MG/ML ~~LOC~~ SOSY
60.0000 mg | PREFILLED_SYRINGE | Freq: Once | SUBCUTANEOUS | Status: AC
Start: 2022-05-22 — End: 2022-05-22
  Administered 2022-05-22: 60 mg via SUBCUTANEOUS

## 2022-05-22 NOTE — Addendum Note (Signed)
Addended by: Mary Sella D on: 05/22/2022 02:38 PM   Modules accepted: Orders

## 2022-05-22 NOTE — Progress Notes (Signed)
Per order of Dr. Baxley patient received Prolia 60 mg/ml in left arm. Patient tolerated well. Vital signs are in within normal limits. 

## 2022-06-16 ENCOUNTER — Ambulatory Visit: Payer: Medicare HMO | Admitting: Hematology & Oncology

## 2022-06-16 ENCOUNTER — Ambulatory Visit: Payer: Medicare HMO

## 2022-06-16 ENCOUNTER — Inpatient Hospital Stay: Payer: Medicare HMO

## 2022-07-04 ENCOUNTER — Other Ambulatory Visit: Payer: Self-pay | Admitting: Internal Medicine

## 2022-07-07 ENCOUNTER — Other Ambulatory Visit: Payer: Self-pay

## 2022-07-07 ENCOUNTER — Encounter: Payer: Self-pay | Admitting: Hematology & Oncology

## 2022-07-07 ENCOUNTER — Inpatient Hospital Stay (HOSPITAL_BASED_OUTPATIENT_CLINIC_OR_DEPARTMENT_OTHER): Payer: Medicare HMO | Admitting: Hematology & Oncology

## 2022-07-07 ENCOUNTER — Inpatient Hospital Stay: Payer: Medicare HMO | Attending: Hematology & Oncology

## 2022-07-07 VITALS — BP 130/47 | HR 64 | Temp 98.8°F | Resp 18 | Ht 64.0 in | Wt 86.0 lb

## 2022-07-07 DIAGNOSIS — D509 Iron deficiency anemia, unspecified: Secondary | ICD-10-CM | POA: Insufficient documentation

## 2022-07-07 DIAGNOSIS — E538 Deficiency of other specified B group vitamins: Secondary | ICD-10-CM

## 2022-07-07 DIAGNOSIS — G35 Multiple sclerosis: Secondary | ICD-10-CM | POA: Insufficient documentation

## 2022-07-07 DIAGNOSIS — D510 Vitamin B12 deficiency anemia due to intrinsic factor deficiency: Secondary | ICD-10-CM | POA: Diagnosis not present

## 2022-07-07 DIAGNOSIS — D500 Iron deficiency anemia secondary to blood loss (chronic): Secondary | ICD-10-CM | POA: Diagnosis not present

## 2022-07-07 DIAGNOSIS — D002 Carcinoma in situ of stomach: Secondary | ICD-10-CM | POA: Diagnosis not present

## 2022-07-07 DIAGNOSIS — Z85028 Personal history of other malignant neoplasm of stomach: Secondary | ICD-10-CM | POA: Diagnosis not present

## 2022-07-07 LAB — CBC WITH DIFFERENTIAL (CANCER CENTER ONLY)
Abs Immature Granulocytes: 0.01 10*3/uL (ref 0.00–0.07)
Basophils Absolute: 0 10*3/uL (ref 0.0–0.1)
Basophils Relative: 0 %
Eosinophils Absolute: 0.1 10*3/uL (ref 0.0–0.5)
Eosinophils Relative: 3 %
HCT: 34.5 % — ABNORMAL LOW (ref 36.0–46.0)
Hemoglobin: 10.8 g/dL — ABNORMAL LOW (ref 12.0–15.0)
Immature Granulocytes: 0 %
Lymphocytes Relative: 53 %
Lymphs Abs: 1.4 10*3/uL (ref 0.7–4.0)
MCH: 29.7 pg (ref 26.0–34.0)
MCHC: 31.3 g/dL (ref 30.0–36.0)
MCV: 94.8 fL (ref 80.0–100.0)
Monocytes Absolute: 0.3 10*3/uL (ref 0.1–1.0)
Monocytes Relative: 13 %
Neutro Abs: 0.8 10*3/uL — ABNORMAL LOW (ref 1.7–7.7)
Neutrophils Relative %: 31 %
Platelet Count: 173 10*3/uL (ref 150–400)
RBC: 3.64 MIL/uL — ABNORMAL LOW (ref 3.87–5.11)
RDW: 12.3 % (ref 11.5–15.5)
WBC Count: 2.6 10*3/uL — ABNORMAL LOW (ref 4.0–10.5)
nRBC: 0 % (ref 0.0–0.2)

## 2022-07-07 LAB — CMP (CANCER CENTER ONLY)
ALT: 18 U/L (ref 0–44)
AST: 26 U/L (ref 15–41)
Albumin: 4 g/dL (ref 3.5–5.0)
Alkaline Phosphatase: 65 U/L (ref 38–126)
Anion gap: 6 (ref 5–15)
BUN: 15 mg/dL (ref 8–23)
CO2: 29 mmol/L (ref 22–32)
Calcium: 8.8 mg/dL — ABNORMAL LOW (ref 8.9–10.3)
Chloride: 107 mmol/L (ref 98–111)
Creatinine: 0.96 mg/dL (ref 0.44–1.00)
GFR, Estimated: >60 mL/min (ref >60)
Glucose, Bld: 88 mg/dL (ref 70–99)
Potassium: 4.1 mmol/L (ref 3.5–5.1)
Sodium: 142 mmol/L (ref 135–145)
Total Bilirubin: 0.7 mg/dL (ref 0.3–1.2)
Total Protein: 7.5 g/dL (ref 6.5–8.1)

## 2022-07-07 LAB — IRON AND IRON BINDING CAPACITY (CC-WL,HP ONLY)
Iron: 74 ug/dL (ref 28–170)
Saturation Ratios: 18 % (ref 10.4–31.8)
TIBC: 419 ug/dL (ref 250–450)
UIBC: 345 ug/dL (ref 148–442)

## 2022-07-07 LAB — VITAMIN B12: Vitamin B-12: 229 pg/mL (ref 180–914)

## 2022-07-07 LAB — FERRITIN: Ferritin: 25 ng/mL (ref 11–307)

## 2022-07-07 NOTE — Progress Notes (Signed)
Hematology and Oncology Follow Up Visit  Erika Murphy [id number] 1950/11/06 72 y.o. 07/07/2022   Principle Diagnosis:  Stage II (T3N0M0) adenocarcinoma of the stomach-remission Recurrent iron deficiency anemia  Pernicious anemia Multiple sclerosis Current Therapy:   IV iron as indicated -- Venofer given on 04/2019 Vitamin B-12 1 mg IM every month --done at home Prolia 60 mg IM q 6 months -- next dose in 11/2022     Interim History:  [name] is back for follow-up.  Her birthday is actually on [birth year].  She had a wonderful birthday yesterday.  Apparently, the whole country celebrated her birthday the fireworks.  She is doing okay.  She unfortunately has never had a good recovery from the right shoulder surgery.  She still has very limited range of motion of the right shoulder.  She is going to go on a cruise in 3 weeks.  I am sure she will enjoy the cruise.  She is doing her vitamin B12 at home.    Her last iron studies that were done back in April showed a ferritin of 24 with an iron saturation of 38%.  Her weight is still on the lower side.  She is eating okay.  She was up in [place] a few weeks ago.  She visited family up there.  Again, she had a good time.  Currently, there is been no flareups of the MS.  Overall, I would say that her performance status is probably ECOG 2.   Medications:  Current Outpatient Medications:    albuterol (VENTOLIN HFA) 108 (90 Base) MCG/ACT inhaler, INHALE 2 PUFFS INTO THE LUNGS EVERY 6 HOURS AS NEEDED FOR SHORTNESS OF BREATH/WHEEZING, Disp: 8.5 each, Rfl: 11   ALPRAZolam (XANAX) 0.5 MG tablet, Take one half tab twice daily as needed for anxiety., Disp: 60 tablet, Rfl: 1   amLODipine (NORVASC) 2.5 MG tablet, TAKE 1 TABLET BY MOUTH EVERY DAY, Disp: 90 tablet, Rfl: 3   cyanocobalamin (VITAMIN B12) 1000 MCG/ML injection, INJECT 1 ML (1,000 MCG TOTAL) INTO THE MUSCLE ONCE A MONTH AS DIRECTED, Disp: 3 mL, Rfl: 3   denosumab (PROLIA) 60 MG/ML SOSY  injection, Inject 60 mg into the skin every 6 (six) months., Disp: , Rfl:    esomeprazole (NEXIUM) 40 MG capsule, TAKE 1 CAPSULE (40 MG TOTAL) BY MOUTH DAILY AT 12 NOON., Disp: 90 capsule, Rfl: 3   meclizine (ANTIVERT) 25 MG tablet, Take 1 tablet (25 mg total) by mouth 3 (three) times daily as needed for dizziness., Disp: 30 tablet, Rfl: 0   meloxicam (MOBIC) 7.5 MG tablet, Take 1 tablet (7.5 mg total) by mouth daily., Disp: 12 tablet, Rfl: 0   methocarbamol (ROBAXIN) 500 MG tablet, Take 1 tablet (500 mg total) by mouth every 8 (eight) hours as needed for muscle spasms., Disp: 30 tablet, Rfl: 1   Multiple Vitamins-Minerals (MULTIVITAMIN GUMMIES WOMENS PO), Take 1 tablet by mouth daily., Disp: , Rfl:    pregabalin (LYRICA) 50 MG capsule, ONE CAPSULE 3 TIMES A DAY BY MOUTH, Disp: 90 capsule, Rfl: 5   rosuvastatin (CRESTOR) 5 MG tablet, Take 1 tablet (5 mg total) by mouth daily., Disp: 90 tablet, Rfl: 3  Allergies: No Known Allergies  Past Medical History, Surgical history, Social history, and Family History were reviewed and updated.  Review of Systems: Review of Systems  Constitutional:  Positive for malaise/fatigue.  HENT:  Positive for tinnitus.   Eyes:  Positive for blurred vision.  Respiratory: Negative.    Cardiovascular:  Negative.   Gastrointestinal:  Positive for diarrhea.  Genitourinary: Negative.   Musculoskeletal:  Positive for myalgias.  Skin: Negative.   Neurological:  Positive for tingling and weakness.  Endo/Heme/Allergies: Negative.   Psychiatric/Behavioral: Negative.      Physical Exam:  height is 5\' 4"  (1.626 m) and weight is 86 lb (39 kg). Her oral temperature is 98.8 F (37.1 C). Her blood pressure is 130/47 (abnormal) and her pulse is 64. Her respiration is 18 and oxygen saturation is 100%.   Physical Exam Vitals reviewed.  Constitutional:      Comments: Very thin African-American female.  She does have some temporal muscle wasting.  There is no adenopathy.   There is no rash.  She has had no palpable liver or spleen.  HENT:     Head: Normocephalic and atraumatic.  Eyes:     Pupils: Pupils are equal, round, and reactive to light.  Cardiovascular:     Rate and Rhythm: Normal rate and regular rhythm.     Heart sounds: Normal heart sounds.  Pulmonary:     Effort: Pulmonary effort is normal.     Breath sounds: Normal breath sounds.  Abdominal:     General: Bowel sounds are normal.     Palpations: Abdomen is soft.  Musculoskeletal:        General: No tenderness or deformity. Normal range of motion.     Cervical back: Normal range of motion.     Comments: She has symmetric muscle atrophy in upper and lower extremities.  She has marked limited range of motion of the right shoulder.  Lymphadenopathy:     Cervical: No cervical adenopathy.  Skin:    General: Skin is warm and dry.     Findings: No erythema or rash.  Neurological:     Mental Status: She is alert and oriented to person, place, and time.  Psychiatric:        Behavior: Behavior normal.        Thought Content: Thought content normal.        Judgment: Judgment normal.      Lab Results  Component Value Date   WBC 2.6 (L) 07/07/2022   HGB 10.8 (L) 07/07/2022   HCT 34.5 (L) 07/07/2022   MCV 94.8 07/07/2022   PLT 173 07/07/2022     Chemistry      Component Value Date/Time   NA 142 07/07/2022 1218   NA 144 06/12/2016 1151   NA 142 06/11/2015 1156   K 4.1 07/07/2022 1218   K 4.7 06/12/2016 1151   K 4.3 06/11/2015 1156   CL 107 07/07/2022 1218   CL 107 06/12/2016 1151   CO2 29 07/07/2022 1218   CO2 32 06/12/2016 1151   CO2 29 06/11/2015 1156   BUN 15 07/07/2022 1218   BUN 11 06/12/2016 1151   BUN 13.5 06/11/2015 1156   CREATININE 0.96 07/07/2022 1218   CREATININE 0.93 01/27/2022 0921   CREATININE 1.2 (H) 06/11/2015 1156      Component Value Date/Time   CALCIUM 8.8 (L) 07/07/2022 1218   CALCIUM 9.6 06/12/2016 1151   CALCIUM 9.7 06/11/2015 1156   ALKPHOS 65  07/07/2022 1218   ALKPHOS 97 (H) 06/12/2016 1151   ALKPHOS 111 06/11/2015 1156   AST 26 07/07/2022 1218   AST 30 06/11/2015 1156   ALT 18 07/07/2022 1218   ALT 26 06/12/2016 1151   ALT 23 06/11/2015 1156   BILITOT 0.7 07/07/2022 1218   BILITOT 1.03 06/11/2015 1156  Impression and Plan: Erika Murphy is a 72 year old African [place] female. She had a history of stage II stomach cancer. She underwent resection. She had adjuvant therapy.  She  is now over 23 years out from treatment. She is cured of the stomach cancer.   I noted that her hemoglobin is little bit lower.  We will have to see what her iron studies show.  I feel bad that her right shoulder is just not doing well.  I know she is done physical therapy.  Of note, we did give her Prolia back on 05/22/2022.  We will plan to get her back in the Fall.  Hopefully, she will have a wonderful Summer.  I know the cruise is important for her.     Josph Macho, MD 7/5/20241:07 PM

## 2022-07-13 ENCOUNTER — Telehealth: Payer: Self-pay | Admitting: Internal Medicine

## 2022-07-13 NOTE — Telephone Encounter (Signed)
Erika Murphy [phone]  Erika Murphy called to say she has had a finger nail last week that was infected and had some puss come out of it, then a couple of days ago, she had a bee sting close to same area. It is still red, but does not have anymore puss coming from it. She is leaving on Tuesday to go on a cruise, I suggested she come into be seen and she said she has an appointment with Dr Myna Hidalgo tomorrow and if it is not better she will call back. I see she is past due for TDAP

## 2022-07-14 ENCOUNTER — Inpatient Hospital Stay: Payer: Medicare HMO

## 2022-07-14 VITALS — BP 118/56 | HR 67 | Temp 98.3°F | Resp 19

## 2022-07-14 DIAGNOSIS — K909 Intestinal malabsorption, unspecified: Secondary | ICD-10-CM

## 2022-07-14 DIAGNOSIS — Z85028 Personal history of other malignant neoplasm of stomach: Secondary | ICD-10-CM | POA: Diagnosis not present

## 2022-07-14 DIAGNOSIS — D509 Iron deficiency anemia, unspecified: Secondary | ICD-10-CM | POA: Diagnosis not present

## 2022-07-14 DIAGNOSIS — G35 Multiple sclerosis: Secondary | ICD-10-CM | POA: Diagnosis not present

## 2022-07-14 DIAGNOSIS — D510 Vitamin B12 deficiency anemia due to intrinsic factor deficiency: Secondary | ICD-10-CM | POA: Diagnosis not present

## 2022-07-14 DIAGNOSIS — D500 Iron deficiency anemia secondary to blood loss (chronic): Secondary | ICD-10-CM

## 2022-07-14 MED ORDER — SODIUM CHLORIDE 0.9 % IV SOLN
200.0000 mg | Freq: Once | INTRAVENOUS | Status: AC
  Administered 2022-07-14: 200 mg via INTRAVENOUS
  Filled 2022-07-14: qty 200

## 2022-07-14 MED ORDER — SODIUM CHLORIDE 0.9 % IV SOLN: Freq: Once | INTRAVENOUS | Status: AC

## 2022-07-14 MED ORDER — CYANOCOBALAMIN 1000 MCG/ML IJ SOLN
1000.0000 ug | Freq: Once | INTRAMUSCULAR | Status: AC
  Administered 2022-07-14: 1000 ug via INTRAMUSCULAR
  Filled 2022-07-14: qty 1

## 2022-07-14 MED ORDER — DENOSUMAB 60 MG/ML ~~LOC~~ SOSY: 60.0000 mg | PREFILLED_SYRINGE | Freq: Once | SUBCUTANEOUS | Status: DC

## 2022-07-17 ENCOUNTER — Other Ambulatory Visit: Payer: Self-pay | Admitting: Family

## 2022-07-17 DIAGNOSIS — L089 Local infection of the skin and subcutaneous tissue, unspecified: Secondary | ICD-10-CM

## 2022-07-17 MED ORDER — CEPHALEXIN 500 MG PO CAPS: 500.0000 mg | ORAL_CAPSULE | Freq: Two times a day (BID) | ORAL | 0 refills | Status: DC

## 2022-07-21 ENCOUNTER — Inpatient Hospital Stay: Payer: Medicare HMO

## 2022-07-21 VITALS — BP 107/36 | HR 65 | Temp 98.2°F | Resp 18

## 2022-07-21 DIAGNOSIS — D510 Vitamin B12 deficiency anemia due to intrinsic factor deficiency: Secondary | ICD-10-CM | POA: Diagnosis not present

## 2022-07-21 DIAGNOSIS — G35 Multiple sclerosis: Secondary | ICD-10-CM | POA: Diagnosis not present

## 2022-07-21 DIAGNOSIS — D500 Iron deficiency anemia secondary to blood loss (chronic): Secondary | ICD-10-CM

## 2022-07-21 DIAGNOSIS — Z85028 Personal history of other malignant neoplasm of stomach: Secondary | ICD-10-CM | POA: Diagnosis not present

## 2022-07-21 DIAGNOSIS — D509 Iron deficiency anemia, unspecified: Secondary | ICD-10-CM | POA: Diagnosis not present

## 2022-07-21 DIAGNOSIS — K909 Intestinal malabsorption, unspecified: Secondary | ICD-10-CM

## 2022-07-21 MED ORDER — SODIUM CHLORIDE 0.9 % IV SOLN: Freq: Once | INTRAVENOUS | Status: AC

## 2022-07-21 MED ORDER — SODIUM CHLORIDE 0.9 % IV SOLN
200.0000 mg | Freq: Once | INTRAVENOUS | Status: AC
  Administered 2022-07-21: 200 mg via INTRAVENOUS
  Filled 2022-07-21: qty 200

## 2022-07-21 NOTE — Patient Instructions (Signed)
Iron Sucrose Injection What is this medication? IRON SUCROSE (EYE ern SOO krose) treats low levels of iron (iron deficiency anemia) in people with kidney disease. Iron is a mineral that plays an important role in making red blood cells, which carry oxygen from your lungs to the rest of your body. This medicine may be used for other purposes; ask your health care provider or pharmacist if you have questions. COMMON BRAND NAME(S): Venofer What should I tell my care team before I take this medication? They need to know if you have any of these conditions: Anemia not caused by low iron levels Heart disease High levels of iron in the blood Kidney disease Liver disease An unusual or allergic reaction to iron, other medications, foods, dyes, or preservatives Pregnant or trying to get pregnant Breastfeeding How should I use this medication? This medication is for infusion into a vein. It is given in a hospital or clinic setting. Talk to your care team about the use of this medication in children. While this medication may be prescribed for children as young as 2 years for selected conditions, precautions do apply. Overdosage: If you think you have taken too much of this medicine contact a poison control center or emergency room at once. NOTE: This medicine is only for you. Do not share this medicine with others. What if I miss a dose? Keep appointments for follow-up doses. It is important not to miss your dose. Call your care team if you are unable to keep an appointment. What may interact with this medication? Do not take this medication with any of the following: Deferoxamine Dimercaprol Other iron products This medication may also interact with the following: Chloramphenicol Deferasirox This list may not describe all possible interactions. Give your health care provider a list of all the medicines, herbs, non-prescription drugs, or dietary supplements you use. Also tell them if you smoke,  drink alcohol, or use illegal drugs. Some items may interact with your medicine. What should I watch for while using this medication? Visit your care team regularly. Tell your care team if your symptoms do not start to get better or if they get worse. You may need blood work done while you are taking this medication. You may need to follow a special diet. Talk to your care team. Foods that contain iron include: whole grains/cereals, dried fruits, beans, or peas, leafy green vegetables, and organ meats (liver, kidney). What side effects may I notice from receiving this medication? Side effects that you should report to your care team as soon as possible: Allergic reactions--skin rash, itching, hives, swelling of the face, lips, tongue, or throat Low blood pressure--dizziness, feeling faint or lightheaded, blurry vision Shortness of breath Side effects that usually do not require medical attention (report to your care team if they continue or are bothersome): Flushing Headache Joint pain Muscle pain Nausea Pain, redness, or irritation at injection site This list may not describe all possible side effects. Call your doctor for medical advice about side effects. You may report side effects to FDA at 1-800-FDA-1088. Where should I keep my medication? This medication is given in a hospital or clinic. It will not be stored at home. NOTE: This sheet is a summary. It may not cover all possible information. If you have questions about this medicine, talk to your doctor, pharmacist, or health care provider.  2024 Elsevier/Gold Standard (2022-05-26 00:00:00)

## 2022-08-04 ENCOUNTER — Other Ambulatory Visit: Payer: Medicare HMO

## 2022-08-04 DIAGNOSIS — I10 Essential (primary) hypertension: Secondary | ICD-10-CM | POA: Diagnosis not present

## 2022-08-04 DIAGNOSIS — E7800 Pure hypercholesterolemia, unspecified: Secondary | ICD-10-CM

## 2022-08-04 LAB — CBC WITH DIFFERENTIAL/PLATELET
Absolute Monocytes: 216 cells/uL (ref 200–950)
Basophils Absolute: 20 cells/uL (ref 0–200)
Basophils Relative: 1 %
Eosinophils Absolute: 82 cells/uL (ref 15–500)
Eosinophils Relative: 4.1 %
HCT: 33.5 % — ABNORMAL LOW (ref 35.0–45.0)
Hemoglobin: 11 g/dL — ABNORMAL LOW (ref 11.7–15.5)
Lymphs Abs: 934 cells/uL (ref 850–3900)
MCH: 30 pg (ref 27.0–33.0)
MCHC: 32.8 g/dL (ref 32.0–36.0)
MCV: 91.3 fL (ref 80.0–100.0)
MPV: 11.4 fL (ref 7.5–12.5)
Monocytes Relative: 10.8 %
Neutro Abs: 748 cells/uL — ABNORMAL LOW (ref 1500–7800)
Neutrophils Relative %: 37.4 %
Platelets: 152 10*3/uL (ref 140–400)
RBC: 3.67 10*6/uL — ABNORMAL LOW (ref 3.80–5.10)
RDW: 11.6 % (ref 11.0–15.0)
Total Lymphocyte: 46.7 %
WBC: 2 10*3/uL — ABNORMAL LOW (ref 3.8–10.8)

## 2022-08-04 LAB — LIPID PANEL
Cholesterol: 139 mg/dL (ref <200)
HDL: 51 mg/dL (ref >=50)
LDL Cholesterol (Calc): 71 mg/dL (calc)
Non-HDL Cholesterol (Calc): 88 mg/dL (calc) (ref <130)
Total CHOL/HDL Ratio: 2.7 (calc) (ref <5.0)
Triglycerides: 88 mg/dL (ref <150)

## 2022-08-04 LAB — COMPREHENSIVE METABOLIC PANEL
AG Ratio: 1.4 (calc) (ref 1.0–2.5)
ALT: 34 U/L — ABNORMAL HIGH (ref 6–29)
AST: 47 U/L — ABNORMAL HIGH (ref 10–35)
Albumin: 3.9 g/dL (ref 3.6–5.1)
Alkaline phosphatase (APISO): 60 U/L (ref 37–153)
BUN: 14 mg/dL (ref 7–25)
CO2: 25 mmol/L (ref 20–32)
Calcium: 8.8 mg/dL (ref 8.6–10.4)
Chloride: 111 mmol/L — ABNORMAL HIGH (ref 98–110)
Creat: 0.93 mg/dL (ref 0.60–1.00)
Globulin: 2.7 g/dL (calc) (ref 1.9–3.7)
Glucose, Bld: 95 mg/dL (ref 65–99)
Potassium: 4.1 mmol/L (ref 3.5–5.3)
Sodium: 143 mmol/L (ref 135–146)
Total Bilirubin: 0.8 mg/dL (ref 0.2–1.2)
Total Protein: 6.6 g/dL (ref 6.1–8.1)

## 2022-08-04 NOTE — Progress Notes (Signed)
Patient Care Team: Margaree Mackintosh, MD as PCP - General (Internal Medicine) Runell Gess, MD as PCP - Cardiology (Cardiology) Josph Macho, MD as Consulting Physician (Oncology)  Visit Date: 08/11/22  Subjective:    Patient ID: Erika Murphy , Female   DOB: 07-08-1950, 72 y.o.    MRN: [id number]   72 y.o. Female presents today for a 6 month follow-up.Lomgstanding history of MS with gait disturbance and weakness.  Requesting rolling walker.Needs one with a seat. Rx provided.  Ambulating now with a crutch type device that has an arm brace which she uses on left arm. She has multiple sclerosis and has issues ambulating. At risk for falling. Gets fatigued easily.  Appetite has been normal for her. She has lost 3 pounds between 05/22/22 and today.  Notes a painful mass on her right buttock for some time. She has tried using hot compresses, neosporin without relief. She is concerned about it and would like to have it removed. No drainage or erythema. Looks like a large skin tag.  She had a finger infection in 7/24 that was treated with Keflex.  History of anxiety treated with alprazolam 0.25 mg twice daily as needed for anxiety.   History of Multiple sclerosis and fibromyalgia treated with Lyrica 50 mg two to three times daily, Robaxin 500 mg every 8 hours as needed. She has not been taking Robaxin.    She has a history of right shoulder pain. She continues to experience limited range of motion. She cannot raise her arm to put her purse over her shoulder. She is followed by her orthopedist. Taking meloxicam 7.5 mg daily.  Followd by Dr. Myna Hidalgo, Oncologist, for stage 2 adenocarcinoma of the stomach. She is in remission.   History of anemia.    History of Vitamin B12 deficiency treated with Vitamin B12 1000 mcg once monthly as directed.   History of iron deficiency. Fe at 90 on 01/13/22. She has had iron infusions in the past.   History of benign leukopenia. Her last WBC measured  at 2.5 on 01/27/22.   History of asthma treated with albuterol 2 puffs every 6 hours as needed.  History of osteoporosis treated with Prolia 60 mg every 6 months.  History of GERD treated with esomeprazole 40 mg daily.  History of hypertension treated with amlodipine 2.5 mg daily. Blood pressure normal today at 100/80.  History of hyperlipidemia treated with rosuvastatin 5 mg daily. Lipid panel normal.  Glucose normal. Chloride elevated at 111. AST elevated at 47 and ALT at 34. She has not been drinking alcohol or taking NSAID medications. WBC low at 2. RBC low at 3.67. Hemoglobin low at 11. HCT low at 33.5. Absolute neutrophils at 748. TSH at 0.63.  Past Medical History:  Diagnosis Date   Anxiety    Arthritis    "right shoulder" (10/09/2013)   Asthma    pt denies   B12 deficiency anemia    pt denies   Fibromyalgia    GERD (gastroesophageal reflux disease)    History of blood transfusion 2001   "when I had my stomach cancer"   Hypertension    IBS (irritable bowel syndrome)    Iron deficiency anemia 12/11/2014   Malabsorption of iron 12/11/2014   MS (multiple sclerosis) (HCC) dx'd ~ 1998   Neurogenic bladder    due to MS   Osteopenia    Osteoporosis    PONV (postoperative nausea and vomiting)    Stomach cancer (HCC) 2001  Thyroid nodule    right     Family History  Problem Relation Age of Onset   Heart disease Mother    Diabetes Father    Stomach cancer Brother    Multiple sclerosis Other    Breast cancer Sister        2 sisters breast cancer    Breast cancer Maternal Aunt     Social History   Social History Narrative   Patient is retired.    Patient is married with 3 children.   Patient drinks caffeine daily.       Review of Systems  Constitutional:  Negative for fever and malaise/fatigue.  HENT:  Negative for congestion.   Eyes:  Negative for blurred vision.  Respiratory:  Negative for cough and shortness of breath.   Cardiovascular:  Negative for  chest pain, palpitations and leg swelling.  Gastrointestinal:  Negative for vomiting.  Musculoskeletal:  Negative for back pain.  Skin:  Negative for rash.       (+) Boil right buttock  Neurological:  Negative for loss of consciousness and headaches.        Objective:   Vitals: BP 100/80   Pulse 71   Ht 5\' 4"  (1.626 m)   Wt 85 lb (38.6 kg)   SpO2 96%   BMI 14.59 kg/m    Physical Exam Vitals and nursing note reviewed.  Constitutional:      General: She is not in acute distress.    Appearance: Normal appearance. She is not ill-appearing or toxic-appearing.  HENT:     Head: Normocephalic and atraumatic.     Right Ear: Hearing, tympanic membrane, ear canal and external ear normal.     Left Ear: Hearing, tympanic membrane, ear canal and external ear normal.     Mouth/Throat:     Pharynx: Oropharynx is clear.  Eyes:     Extraocular Movements: Extraocular movements intact.     Pupils: Pupils are equal, round, and reactive to light.  Neck:     Thyroid: No thyroid mass, thyromegaly or thyroid tenderness.     Vascular: No carotid bruit.  Cardiovascular:     Rate and Rhythm: Normal rate and regular rhythm. No extrasystoles are present.    Pulses:          Dorsalis pedis pulses are 1+ on the right side and 1+ on the left side.     Heart sounds: Normal heart sounds. No murmur heard.    No friction rub. No gallop.  Pulmonary:     Effort: Pulmonary effort is normal.     Breath sounds: Normal breath sounds. No decreased breath sounds, wheezing, rhonchi or rales.  Chest:     Chest wall: No mass.  Abdominal:     Palpations: Abdomen is soft. There is no hepatomegaly, splenomegaly or mass.     Tenderness: There is no abdominal tenderness.     Hernia: No hernia is present.  Musculoskeletal:     Cervical back: Normal range of motion.     Right lower leg: No edema.     Left lower leg: No edema.  Lymphadenopathy:     Cervical: No cervical adenopathy.     Upper Body:     Right  upper body: No supraclavicular adenopathy.     Left upper body: No supraclavicular adenopathy.  Skin:    General: Skin is warm and dry.     Comments: Smooth rounded benign appearing mass 7 mm diameter mid right buttock.  Neurological:  General: No focal deficit present.     Mental Status: She is alert and oriented to person, place, and time. Mental status is at baseline.     Sensory: Sensation is intact.     Motor: Motor function is intact. No weakness.     Deep Tendon Reflexes: Reflexes are normal and symmetric.  Psychiatric:        Attention and Perception: Attention normal.        Mood and Affect: Mood normal.        Speech: Speech normal.        Behavior: Behavior normal.        Thought Content: Thought content normal.        Cognition and Memory: Cognition normal.        Judgment: Judgment normal.      Results:   Studies obtained and personally reviewed by me:   Labs:       Component Value Date/Time   NA 143 08/04/2022 1118   NA 144 06/12/2016 1151   NA 142 06/11/2015 1156   K 4.1 08/04/2022 1118   K 4.7 06/12/2016 1151   K 4.3 06/11/2015 1156   CL 111 (H) 08/04/2022 1118   CL 107 06/12/2016 1151   CO2 25 08/04/2022 1118   CO2 32 06/12/2016 1151   CO2 29 06/11/2015 1156   GLUCOSE 95 08/04/2022 1118   GLUCOSE 88 06/12/2016 1151   BUN 14 08/04/2022 1118   BUN 11 06/12/2016 1151   BUN 13.5 06/11/2015 1156   CREATININE 0.93 08/04/2022 1118   CREATININE 1.2 (H) 06/11/2015 1156   CALCIUM 8.8 08/04/2022 1118   CALCIUM 9.6 06/12/2016 1151   CALCIUM 9.7 06/11/2015 1156   PROT 6.6 08/04/2022 1118   PROT 7.5 06/12/2016 1151   PROT 7.5 06/11/2015 1156   ALBUMIN 4.0 07/07/2022 1218   ALBUMIN 3.8 06/12/2016 1151   ALBUMIN 3.8 06/11/2015 1156   AST 47 (H) 08/04/2022 1118   AST 26 07/07/2022 1218   AST 30 06/11/2015 1156   ALT 34 (H) 08/04/2022 1118   ALT 18 07/07/2022 1218   ALT 26 06/12/2016 1151   ALT 23 06/11/2015 1156   ALKPHOS 65 07/07/2022 1218    ALKPHOS 97 (H) 06/12/2016 1151   ALKPHOS 111 06/11/2015 1156   BILITOT 0.8 08/04/2022 1118   BILITOT 0.7 07/07/2022 1218   BILITOT 1.03 06/11/2015 1156   GFRNONAA >60 07/07/2022 1218   GFRNONAA 73 01/16/2020 0949   GFRAA 85 01/16/2020 0949     Lab Results  Component Value Date   WBC 2.0 (L) 08/04/2022   HGB 11.0 (L) 08/04/2022   HCT 33.5 (L) 08/04/2022   MCV 91.3 08/04/2022   PLT 152 08/04/2022    Lab Results  Component Value Date   CHOL 139 08/04/2022   HDL 51 08/04/2022   LDLCALC 71 08/04/2022   TRIG 88 08/04/2022   CHOLHDL 2.7 08/04/2022    Lab Results  Component Value Date   HGBA1C 5.0 05/13/2021     Lab Results  Component Value Date   TSH 0.63 08/04/2022      Assessment & Plan:   Requesting rolling walker. We have written a RX. Needs one with a seat. Has generlized weakness and ambulation difficulty due to MS.  Appetite has been normal for her. She has lost 3 pounds between 05/22/22 and today.  Benign appearing mass mid right buttock 7mm: she would like to have this removed. Referral for general surgeon.  Anxiety: treated with  alprazolam 0.25 mg twice daily as needed for anxiety.   Multiple sclerosis and fibromyalgia: treated with Lyrica 50 mg two to three times daily, Robaxin 500 mg every 8 hours as needed. She has not been taking Robaxin.    Right shoulder pain: taking meloxicam 7.5 mg daily.  Stage 2 adenocarcinoma of the stomach: followed by Dr. Myna Hidalgo, oncologist. She is in remission.   Vitamin B12 deficiency: treated with Vitamin B12 1000 mcg once monthly as directed.   Iron deficiency anemia: Fe at 90 on 01/13/22. She has had iron infusions in the past. Hemoglobin low at 11. HCT low at 33.5.  Benign leukopenia: her last WBC measured low at 2 on 08/04/22. She will ask Dr. Myna Hidalgo about her low WBC.    Asthma: treated with albuterol 2 puffs every 6 hours as needed.  Osteoporosis: treated with Prolia 60 mg every 6 months.  GERD: treated with  esomeprazole 40 mg daily.  Mild elevation of LFTs- make Dr. Myna Hidalgo aware  Hypertension: treated with amlodipine 2.5 mg daily. Blood pressure normal today at 100/80.  Hyperlipidemia: treated with rosuvastatin 5 mg daily. Lipid panel normal.  Elevation of AST / ALT:  AST elevated at 47 and ALT at 34. She has not been drinking alcohol or taking NSAID medications.   Vaccine counseling: will go to the pharmacy for tetanus vaccine.  Return in 1 month to recheck liver functions- no office visit.    I,Alexander Ruley,acting as a Neurosurgeon for Margaree Mackintosh, MD.,have documented all relevant documentation on the behalf of Margaree Mackintosh, MD,as directed by  Margaree Mackintosh, MD while in the presence of Margaree Mackintosh, MD.   I, Margaree Mackintosh, MD, have reviewed all documentation for this visit. The documentation on 08/15/22 for the exam, diagnosis, procedures, and orders are all accurate and complete.

## 2022-08-11 ENCOUNTER — Ambulatory Visit (INDEPENDENT_AMBULATORY_CARE_PROVIDER_SITE_OTHER): Payer: Medicare HMO | Admitting: Internal Medicine

## 2022-08-11 ENCOUNTER — Encounter: Payer: Self-pay | Admitting: Internal Medicine

## 2022-08-11 VITALS — BP 100/80 | HR 71 | Ht 64.0 in | Wt 85.0 lb

## 2022-08-11 DIAGNOSIS — Z903 Acquired absence of stomach [part of]: Secondary | ICD-10-CM

## 2022-08-11 DIAGNOSIS — M810 Age-related osteoporosis without current pathological fracture: Secondary | ICD-10-CM

## 2022-08-11 DIAGNOSIS — I10 Essential (primary) hypertension: Secondary | ICD-10-CM

## 2022-08-11 DIAGNOSIS — G35 Multiple sclerosis: Secondary | ICD-10-CM | POA: Diagnosis not present

## 2022-08-11 DIAGNOSIS — E538 Deficiency of other specified B group vitamins: Secondary | ICD-10-CM

## 2022-08-11 DIAGNOSIS — Z85028 Personal history of other malignant neoplasm of stomach: Secondary | ICD-10-CM

## 2022-08-11 DIAGNOSIS — E7800 Pure hypercholesterolemia, unspecified: Secondary | ICD-10-CM | POA: Diagnosis not present

## 2022-08-11 DIAGNOSIS — D367 Benign neoplasm of other specified sites: Secondary | ICD-10-CM | POA: Diagnosis not present

## 2022-08-11 DIAGNOSIS — Z862 Personal history of diseases of the blood and blood-forming organs and certain disorders involving the immune mechanism: Secondary | ICD-10-CM

## 2022-08-11 DIAGNOSIS — Z9989 Dependence on other enabling machines and devices: Secondary | ICD-10-CM

## 2022-08-11 DIAGNOSIS — D708 Other neutropenia: Secondary | ICD-10-CM

## 2022-08-14 ENCOUNTER — Telehealth: Payer: Self-pay

## 2022-08-14 ENCOUNTER — Other Ambulatory Visit: Payer: Self-pay | Admitting: Internal Medicine

## 2022-08-14 NOTE — Telephone Encounter (Signed)
Patient called & spoke with Erika Murphy, registration questioning if her PCP had forwarded the labs she had drawn last week and with concerns related, including abd pain.   Dr Myna Hidalgo reviewed labs and Dr Beryle Quant plan to repeat LFTs. Dr Myna Hidalgo agrees with this plan. If patient has concerns beyond this, Dr Myna Hidalgo says he is willing to order an abd ultrasound to assess for further issues.   This information left on pt's personalized VM with instructions to contact our office if she wished to proceed with [place]. dph

## 2022-08-15 ENCOUNTER — Other Ambulatory Visit: Payer: Self-pay

## 2022-08-15 ENCOUNTER — Encounter (HOSPITAL_COMMUNITY): Payer: Self-pay

## 2022-08-15 ENCOUNTER — Telehealth: Payer: Self-pay | Admitting: Internal Medicine

## 2022-08-15 ENCOUNTER — Emergency Department (HOSPITAL_COMMUNITY): Payer: Medicare HMO

## 2022-08-15 ENCOUNTER — Encounter: Payer: Self-pay | Admitting: Licensed Clinical Social Worker

## 2022-08-15 ENCOUNTER — Emergency Department (HOSPITAL_COMMUNITY)
Admission: EM | Admit: 2022-08-15 | Discharge: 2022-08-15 | Disposition: A | Discharge status: Home / Self Care | Payer: Medicare HMO | Attending: Emergency Medicine | Admitting: Emergency Medicine

## 2022-08-15 DIAGNOSIS — R1033 Periumbilical pain: Secondary | ICD-10-CM | POA: Diagnosis not present

## 2022-08-15 DIAGNOSIS — R109 Unspecified abdominal pain: Secondary | ICD-10-CM | POA: Diagnosis present

## 2022-08-15 DIAGNOSIS — D002 Carcinoma in situ of stomach: Secondary | ICD-10-CM

## 2022-08-15 DIAGNOSIS — R1032 Left lower quadrant pain: Secondary | ICD-10-CM | POA: Insufficient documentation

## 2022-08-15 LAB — COMPREHENSIVE METABOLIC PANEL
ALT: 35 U/L (ref 0–44)
AST: 38 U/L (ref 15–41)
Albumin: 4.1 g/dL (ref 3.5–5.0)
Alkaline Phosphatase: 69 U/L (ref 38–126)
Anion gap: 7 (ref 5–15)
BUN: 16 mg/dL (ref 8–23)
CO2: 26 mmol/L (ref 22–32)
Calcium: 9.2 mg/dL (ref 8.9–10.3)
Chloride: 105 mmol/L (ref 98–111)
Creatinine, Ser: 1.09 mg/dL — ABNORMAL HIGH (ref 0.44–1.00)
GFR, Estimated: 53 mL/min — ABNORMAL LOW (ref >60)
Glucose, Bld: 87 mg/dL (ref 70–99)
Potassium: 3.9 mmol/L (ref 3.5–5.1)
Sodium: 138 mmol/L (ref 135–145)
Total Bilirubin: 0.7 mg/dL (ref 0.3–1.2)
Total Protein: 7.7 g/dL (ref 6.5–8.1)

## 2022-08-15 LAB — URINALYSIS, ROUTINE W REFLEX MICROSCOPIC
Bacteria, UA: NONE SEEN
Bilirubin Urine: NEGATIVE
Glucose, UA: NEGATIVE mg/dL
Hgb urine dipstick: NEGATIVE
Ketones, ur: 5 mg/dL — AB
Nitrite: NEGATIVE
Protein, ur: NEGATIVE mg/dL
Specific Gravity, Urine: 1.018 (ref 1.005–1.030)
pH: 5 (ref 5.0–8.0)

## 2022-08-15 LAB — CBC WITH DIFFERENTIAL/PLATELET
Abs Immature Granulocytes: 0 10*3/uL (ref 0.00–0.07)
Basophils Absolute: 0 10*3/uL (ref 0.0–0.1)
Basophils Relative: 1 %
Eosinophils Absolute: 0.1 10*3/uL (ref 0.0–0.5)
Eosinophils Relative: 6 %
HCT: 39.8 % (ref 36.0–46.0)
Hemoglobin: 12.6 g/dL (ref 12.0–15.0)
Lymphocytes Relative: 41 %
Lymphs Abs: 0.9 10*3/uL (ref 0.7–4.0)
MCH: 30.2 pg (ref 26.0–34.0)
MCHC: 31.7 g/dL (ref 30.0–36.0)
MCV: 95.4 fL (ref 80.0–100.0)
Monocytes Absolute: 0.2 10*3/uL (ref 0.1–1.0)
Monocytes Relative: 10 %
Neutro Abs: 1 10*3/uL — ABNORMAL LOW (ref 1.7–7.7)
Neutrophils Relative %: 42 %
Platelets: 147 10*3/uL — ABNORMAL LOW (ref 150–400)
RBC: 4.17 MIL/uL (ref 3.87–5.11)
RDW: 12.1 % (ref 11.5–15.5)
WBC: 2.3 10*3/uL — ABNORMAL LOW (ref 4.0–10.5)
nRBC: 0 % (ref 0.0–0.2)

## 2022-08-15 LAB — LIPASE, BLOOD: Lipase: 29 U/L (ref 11–51)

## 2022-08-15 MED ORDER — AZITHROMYCIN 250 MG PO TABS: 250.0000 mg | ORAL_TABLET | Freq: Every day | ORAL | 0 refills | Status: DC

## 2022-08-15 MED ORDER — MORPHINE SULFATE (PF) 4 MG/ML IV SOLN
4.0000 mg | Freq: Once | INTRAVENOUS | Status: AC
  Administered 2022-08-15: 4 mg via INTRAVENOUS
  Filled 2022-08-15: qty 1

## 2022-08-15 MED ORDER — ONDANSETRON HCL 4 MG/2ML IJ SOLN
4.0000 mg | Freq: Once | INTRAMUSCULAR | Status: AC
  Administered 2022-08-15: 4 mg via INTRAVENOUS
  Filled 2022-08-15: qty 2

## 2022-08-15 MED ORDER — SODIUM CHLORIDE 0.9 % IV BOLUS
1000.0000 mL | Freq: Once | INTRAVENOUS | Status: AC
  Administered 2022-08-15: 1000 mL via INTRAVENOUS

## 2022-08-15 MED ORDER — IOHEXOL 300 MG/ML  SOLN
80.0000 mL | Freq: Once | INTRAMUSCULAR | Status: AC | PRN
  Administered 2022-08-15: 80 mL via INTRAVENOUS

## 2022-08-15 NOTE — Patient Instructions (Addendum)
Rx for rolling walker with a seat. Make Dr. Myna Hidalgo aware of mild elevation LFTs. Has Xanax for anxiety. Continue Prolia. Continue amlodipine for mild hypertension. Tale Meloxicam for shoulder pain as needed.. RTC in 6 months.

## 2022-08-15 NOTE — Progress Notes (Signed)
Patient called stating she does request the abd [place]. Orders placed per MD

## 2022-08-15 NOTE — Discharge Instructions (Addendum)
Make sure to follow-up with primary care provider and your oncologist  As discussed in the room your CT scan of your abdomen did not show any significant abnormalities with relates to your abdomen however showed a possible developing pneumonia.  If you develop any cough, fever please start antibiotics as prescribed  Return for new or worsening symptoms

## 2022-08-15 NOTE — ED Triage Notes (Signed)
Lower abdominal and LLQ pains x 4 days.   Says she recently went on a cruise and upon arrival has had pain after eating.

## 2022-08-15 NOTE — ED Notes (Signed)
Attempted urine specimen. Unable to provide.

## 2022-08-15 NOTE — Telephone Encounter (Signed)
Misty Stanley, RN with [employer] called today to see if we had sent an order to [facility] for a rolling walker with a seat. I know we gave Ainslie the original prescription for the walker, it did not say anything about seat and I did put ICD 10 codes on it. Did she say anything to you about it needing to go to [facility]? If you have not finished the note from Friday will you put in the note as to why she will need this walker because that will the next thing Medicare will require, along with a form for [place] to fill out probably.Marland Kitchen

## 2022-08-15 NOTE — ED Provider Notes (Signed)
[place] EMERGENCY DEPARTMENT AT [facility] Provider Note   CSN: [id number] Arrival date & time: 08/15/22  1843    History {Add pertinent medical, surgical, social history, OB history to HPI:1} Chief Complaint  Patient presents with   Abdominal Pain    Erika Murphy is a 72 y.o. female here for evaluation of abdominal pain.  Patient states she has had left lower quadrant, lower and periumbilical abdominal pain over the last 4 days.  Pain is constant.  She initially thought she was constipated however proceeded had bowel movements over the last 2 days her pain did not improve.  She had 2 "normal" bowel movement today with any diarrhea or constipation.  No blood in stool.  Pain does not radiate into back.  She has extensive past medical history including prior abdominal surgeries for gastric cancer.  She denies any fever, nausea, vomiting, back pain, dysuria, hematuria, chest pain, shortness of breath, cough.  She denies any sick contacts.  States she recently went on a cruise when her symptoms began.  No other sick contacts.  Of note she had some mildly elevated LFTs 10 days ago at oncology.  She denies any EtOH use, pain worse with eating or drinking, pain to upper abdomen  HPI     Home Medications Prior to Admission medications   Medication Sig Start Date End Date Taking? Authorizing Provider  azithromycin (ZITHROMAX) 250 MG tablet Take 1 tablet (250 mg total) by mouth daily. Take first 2 tablets together, then 1 every day until finished. 08/15/22  Yes ,  A, PA-C  albuterol (VENTOLIN HFA) 108 (90 Base) MCG/ACT inhaler INHALE 2 PUFFS INTO THE LUNGS EVERY 6 HOURS AS NEEDED FOR SHORTNESS OF BREATH/WHEEZING 07/04/22   Margaree Mackintosh, MD  ALPRAZolam Prudy Feeler) 0.5 MG tablet Take one half tab twice daily as needed for anxiety. 06/08/21   Margaree Mackintosh, MD  amLODipine (NORVASC) 2.5 MG tablet TAKE 1 TABLET BY MOUTH EVERY DAY 01/02/22   Margaree Mackintosh, MD  cephALEXin (KEFLEX) 500  MG capsule Take 1 capsule (500 mg total) by mouth 2 (two) times daily. 07/17/22   Erenest Blank, NP  cyanocobalamin (VITAMIN B12) 1000 MCG/ML injection INJECT 1 ML (1,000 MCG TOTAL) INTO THE MUSCLE ONCE A MONTH AS DIRECTED 01/02/22   Margaree Mackintosh, MD  denosumab (PROLIA) 60 MG/ML SOSY injection Inject 60 mg into the skin every 6 (six) months.    [provider]  esomeprazole (NEXIUM) 40 MG capsule TAKE 1 CAPSULE (40 MG TOTAL) BY MOUTH DAILY AT 12 NOON. 01/02/22   Baxley, Luanna Cole, MD  meclizine (ANTIVERT) 25 MG tablet Take 1 tablet (25 mg total) by mouth 3 (three) times daily as needed for dizziness. 12/22/19   Margaree Mackintosh, MD  meloxicam (MOBIC) 7.5 MG tablet Take 1 tablet (7.5 mg total) by mouth daily. 02/27/22   Margaree Mackintosh, MD  methocarbamol (ROBAXIN) 500 MG tablet Take 1 tablet (500 mg total) by mouth every 8 (eight) hours as needed for muscle spasms. 05/27/21   Beverely Low, MD  Multiple Vitamins-Minerals (MULTIVITAMIN GUMMIES WOMENS PO) Take 1 tablet by mouth daily.    [provider]  pregabalin (LYRICA) 50 MG capsule ONE CAPSULE 3 TIMES A DAY BY MOUTH 04/09/22   Margaree Mackintosh, MD  rosuvastatin (CRESTOR) 5 MG tablet TAKE 1 TABLET (5 MG TOTAL) BY MOUTH DAILY. 08/14/22   Margaree Mackintosh, MD      Allergies    Patient  has no known allergies.    Review of Systems   Review of Systems  Constitutional: Negative.   HENT: Negative.    Respiratory: Negative.    Cardiovascular: Negative.   Gastrointestinal:  Positive for abdominal pain. Negative for abdominal distention, anal bleeding, blood in stool, constipation, diarrhea, nausea, rectal pain and vomiting.  Genitourinary: Negative.   Musculoskeletal: Negative.   Skin: Negative.   Neurological: Negative.   All other systems reviewed and are negative.   Physical Exam Updated Vital Signs BP (!) 108/92 (BP Location: Left Arm)   Pulse 79   Temp 98.1 F (36.7 C) (Oral)   Resp 17   Ht 5\' 4"  (1.626 m)   Wt 38.6 kg   SpO2  100%   BMI 14.59 kg/m  Physical Exam Vitals and nursing note reviewed.  Constitutional:      General: She is not in acute distress.    Appearance: She is not ill-appearing, toxic-appearing or diaphoretic.     Comments: Thin  HENT:     Head: Normocephalic and atraumatic.  Eyes:     Pupils: Pupils are equal, round, and reactive to light.  Cardiovascular:     Rate and Rhythm: Normal rate.     Heart sounds: Normal heart sounds.  Pulmonary:     Effort: Pulmonary effort is normal. No respiratory distress.     Breath sounds: Normal breath sounds.     Comments: Clear bilaterally, speaks in full sentences without difficulty Abdominal:     General: Bowel sounds are normal. There is no distension.     Palpations: Abdomen is soft.     Tenderness: There is abdominal tenderness in the periumbilical area, suprapubic area and left lower quadrant. There is no right CVA tenderness, left CVA tenderness, guarding or rebound. Negative signs include Murphy's sign and McBurney's sign.     Hernia: No hernia is present.  Musculoskeletal:        General: Normal range of motion.     Cervical back: Normal range of motion.  Skin:    General: Skin is warm and dry.  Neurological:     General: No focal deficit present.     Mental Status: She is alert.  Psychiatric:        Mood and Affect: Mood normal.    ED Results / Procedures / Treatments   Labs (all labs ordered are listed, but only abnormal results are displayed) Labs Reviewed  COMPREHENSIVE METABOLIC PANEL - Abnormal; Notable for the following components:      Result Value   Creatinine, Ser 1.09 (*)    GFR, Estimated 53 (*)    All other components within normal limits  URINALYSIS, ROUTINE W REFLEX MICROSCOPIC - Abnormal; Notable for the following components:   Ketones, ur 5 (*)    Leukocytes,Ua MODERATE (*)    All other components within normal limits  CBC WITH DIFFERENTIAL/PLATELET - Abnormal; Notable for the following components:   WBC 2.3  (*)    Platelets 147 (*)    Neutro Abs 1.0 (*)    All other components within normal limits  LIPASE, BLOOD    EKG None  Radiology CT ABDOMEN PELVIS W CONTRAST  Result Date: 08/15/2022 CLINICAL DATA:  Acute nonlocalized abdominal pain. Periumbilical abdominal pain. Lower abdominal pain. Left lower quadrant pain. Symptoms for 4 days. EXAM: CT ABDOMEN AND PELVIS WITH CONTRAST TECHNIQUE: Multidetector CT imaging of the abdomen and pelvis was performed using the standard protocol following bolus administration of intravenous contrast. RADIATION DOSE REDUCTION:  This exam was performed according to the departmental dose-optimization program which includes automated exposure control, adjustment of the mA and/or kV according to patient size and/or use of iterative reconstruction technique. CONTRAST:  80mL OMNIPAQUE IOHEXOL 300 MG/ML  SOLN COMPARISON:  Abdominal radiographs 02/27/2022. CT abdomen and pelvis 02/13/2020 FINDINGS: Lower chest: There is a focal area of poorly defined perivascular tree-in-bud infiltration in the right lower lung. This may represent early acute pneumonia. Heart is enlarged. Hepatobiliary: No focal liver abnormality is seen. No gallstones, gallbladder wall thickening, or biliary dilatation. Pancreas: Unremarkable. No pancreatic ductal dilatation or surrounding inflammatory changes. Spleen: Normal in size without focal abnormality. Adrenals/Urinary Tract: Adrenal glands are unremarkable. Kidneys are normal, without renal calculi, focal lesion, or hydronephrosis. Bladder is unremarkable. Stomach/Bowel: Stomach is decompressed. Surgical clips in the upper abdomen. Small bowel and colon are decompressed with scattered stool in the colon. No definitive evidence of wall thickening or inflammatory change. Appendix is not identified. Vascular/Lymphatic: Aortic atherosclerosis. No enlarged abdominal or pelvic lymph nodes. Reproductive: Visualization of the low pelvis is limited by streak  artifact from right hip arthroplasty. No pelvic masses identified. Other: No free air or free fluid in the abdomen. Abdominal wall musculature appears intact. Musculoskeletal: Degenerative changes. No destructive bone lesions. Right hip arthroplasty. IMPRESSION: 1. No evidence suggesting bowel obstruction or inflammation. Bowel are mostly decompressed, limiting evaluation. 2. Small focal area of infiltration demonstrated in the right lung base possibly representing early pneumonia. 3. Cardiac enlargement. 4. Aortic atherosclerosis. Electronically Signed   By: Burman Nieves M.D.   On: 08/15/2022 22:18    Procedures Procedures  {Document cardiac monitor, telemetry assessment procedure when appropriate:1}  Medications Ordered in ED Medications  sodium chloride 0.9 % bolus 1,000 mL (1,000 mLs Intravenous Bolus 08/15/22 2143)  ondansetron (ZOFRAN) injection 4 mg (4 mg Intravenous Given 08/15/22 2143)  morphine (PF) 4 MG/ML injection 4 mg (4 mg Intravenous Given 08/15/22 2143)  iohexol (OMNIPAQUE) 300 MG/ML solution 80 mL (80 mLs Intravenous Contrast Given 08/15/22 2159)    ED Course/ Medical Decision Making/ A&P   72 year old here for evaluation abdominal pain.  Has prior history of gastric cancer s/p multiple abdominal surgeries.  Symptoms started 4 days ago located to lower abdomen and left lower quadrant.  Initially thought she was constipated however subsequently had bowel movements however pain did not improve.  Describes pain as aching.  Does not radiate.  No urinary symptoms.  No bloody stool.  No chest pain, shortness of breath, fever, cough, congestion.  Having any diarrhea.  Family he went on a cruise with her did not have any symptoms.  Will plan on labs, imaging and reassess.  She has some mild tenderness to her suprapubic and periumbilical region however no rebound or guarding.  Labs and imaging personally viewed interpreted CBC WBC 2.3, chronic Metabolic panel creatinine 1.09, LFTs have  normalized since prior labs UA with moderate leuks, nitrite negative, no bacteria, will send for culture, she denies any UTI symptoms    {   Click here for ABCD2, HEART and other calculatorsREFRESH Note before signing :1}                              Medical Decision Making Amount and/or Complexity of Data Reviewed Labs: ordered. Radiology: ordered.  Risk Prescription drug management.     {Document critical care time when appropriate:1} {Document review of labs and clinical decision tools ie heart score, Chads2Vasc2 etc:1}  {  Document your independent review of radiology images, and any outside records:1} {Document your discussion with family members, caretakers, and with consultants:1} {Document social determinants of health affecting pt's care:1} {Document your decision making why or why not admission, treatments were needed:1} Final Clinical Impression(s) / ED Diagnoses Final diagnoses:  Periumbilical abdominal pain    Rx / DC Orders ED Discharge Orders          Ordered    azithromycin (ZITHROMAX) 250 MG tablet  Daily        08/15/22 2301

## 2022-08-16 ENCOUNTER — Telehealth: Payer: Self-pay

## 2022-08-16 ENCOUNTER — Telehealth: Payer: Self-pay | Admitting: Internal Medicine

## 2022-08-16 ENCOUNTER — Telehealth: Payer: Self-pay | Admitting: *Deleted

## 2022-08-16 NOTE — Telephone Encounter (Signed)
Erika Murphy [phone]  Jackelyn called to say she went to the emergency room last night with abdominal pain, and they said she was to follow up with her PCP, that she might have pneumonia. Do you want to see her on Friday for a follow up, I do see on discharge summary where it says follow up with PCP in 2 days

## 2022-08-16 NOTE — Telephone Encounter (Signed)
Patient called to follow up from ED yesterday. Dr.Ennever reviewed ED notes and CT scan, CT scan looked good per MD  but does show some possible pneumonia, MD recommended pt take abx prescribed by ED and follow up with PCP. Pt verbalized and declines any other questions or concerns at this time.

## 2022-08-16 NOTE — Telephone Encounter (Signed)
After talking with Dr Lenord Fellers scheduled ED follow up for Friday.

## 2022-08-16 NOTE — Progress Notes (Signed)
  Care Coordination   Note   08/16/2022 Name: Erika Murphy MRN: [id number] DOB: 1950-07-20  Erika Murphy is a 72 y.o. year old female who sees Baxley, Luanna Cole, MD for primary care. I reached out to Gilmore Laroche by phone today to offer care coordination services.  Ms. Braniff was given information about Care Coordination services today including:   The Care Coordination services include support from the care team which includes your Nurse Coordinator, Clinical Social Worker, or Pharmacist.  The Care Coordination team is here to help remove barriers to the health concerns and goals most important to you. Care Coordination services are voluntary, and the patient may decline or stop services at any time by request to their care team member.   Care Coordination Consent Status: Patient agreed to services and verbal consent obtained.   Follow up plan:  Telephone appointment with care coordination team member scheduled for:  08/23/2022  Encounter Outcome:  Pt. Scheduled from referral   Burman Nieves, [facility] Care Coordination Care Guide Direct Dial: [phone]

## 2022-08-18 ENCOUNTER — Emergency Department (HOSPITAL_COMMUNITY)
Admission: EM | Admit: 2022-08-18 | Discharge: 2022-08-18 | Disposition: A | Discharge status: Home / Self Care | Payer: Medicare HMO | Attending: Emergency Medicine | Admitting: Emergency Medicine

## 2022-08-18 ENCOUNTER — Other Ambulatory Visit: Payer: Self-pay

## 2022-08-18 ENCOUNTER — Emergency Department (HOSPITAL_COMMUNITY): Payer: Medicare HMO

## 2022-08-18 ENCOUNTER — Encounter: Payer: Self-pay | Admitting: Internal Medicine

## 2022-08-18 ENCOUNTER — Encounter (HOSPITAL_COMMUNITY): Payer: Self-pay

## 2022-08-18 ENCOUNTER — Ambulatory Visit: Payer: Self-pay

## 2022-08-18 ENCOUNTER — Ambulatory Visit (INDEPENDENT_AMBULATORY_CARE_PROVIDER_SITE_OTHER): Payer: Medicare HMO | Admitting: Internal Medicine

## 2022-08-18 VITALS — BP 110/80 | HR 80 | Temp 98.2°F | Ht 64.0 in | Wt 83.0 lb

## 2022-08-18 DIAGNOSIS — Z79899 Other long term (current) drug therapy: Secondary | ICD-10-CM | POA: Insufficient documentation

## 2022-08-18 DIAGNOSIS — R1013 Epigastric pain: Secondary | ICD-10-CM | POA: Diagnosis not present

## 2022-08-18 DIAGNOSIS — D72819 Decreased white blood cell count, unspecified: Secondary | ICD-10-CM | POA: Insufficient documentation

## 2022-08-18 DIAGNOSIS — Z20822 Contact with and (suspected) exposure to covid-19: Secondary | ICD-10-CM | POA: Diagnosis not present

## 2022-08-18 DIAGNOSIS — R54 Age-related physical debility: Secondary | ICD-10-CM | POA: Diagnosis not present

## 2022-08-18 DIAGNOSIS — R55 Syncope and collapse: Secondary | ICD-10-CM | POA: Insufficient documentation

## 2022-08-18 DIAGNOSIS — R109 Unspecified abdominal pain: Secondary | ICD-10-CM | POA: Diagnosis not present

## 2022-08-18 DIAGNOSIS — J479 Bronchiectasis, uncomplicated: Secondary | ICD-10-CM | POA: Insufficient documentation

## 2022-08-18 DIAGNOSIS — I700 Atherosclerosis of aorta: Secondary | ICD-10-CM | POA: Diagnosis not present

## 2022-08-18 DIAGNOSIS — N319 Neuromuscular dysfunction of bladder, unspecified: Secondary | ICD-10-CM

## 2022-08-18 DIAGNOSIS — R7309 Other abnormal glucose: Secondary | ICD-10-CM | POA: Diagnosis not present

## 2022-08-18 DIAGNOSIS — M810 Age-related osteoporosis without current pathological fracture: Secondary | ICD-10-CM | POA: Diagnosis not present

## 2022-08-18 DIAGNOSIS — Z85028 Personal history of other malignant neoplasm of stomach: Secondary | ICD-10-CM | POA: Diagnosis not present

## 2022-08-18 DIAGNOSIS — G35 Multiple sclerosis: Secondary | ICD-10-CM | POA: Diagnosis not present

## 2022-08-18 DIAGNOSIS — E041 Nontoxic single thyroid nodule: Secondary | ICD-10-CM | POA: Diagnosis not present

## 2022-08-18 DIAGNOSIS — D649 Anemia, unspecified: Secondary | ICD-10-CM

## 2022-08-18 DIAGNOSIS — Z96611 Presence of right artificial shoulder joint: Secondary | ICD-10-CM | POA: Diagnosis not present

## 2022-08-18 DIAGNOSIS — Z96612 Presence of left artificial shoulder joint: Secondary | ICD-10-CM | POA: Diagnosis not present

## 2022-08-18 LAB — COMPREHENSIVE METABOLIC PANEL
ALT: 36 U/L (ref 0–44)
AST: 43 U/L — ABNORMAL HIGH (ref 15–41)
Albumin: 3.8 g/dL (ref 3.5–5.0)
Alkaline Phosphatase: 68 U/L (ref 38–126)
Anion gap: 12 (ref 5–15)
BUN: 8 mg/dL (ref 8–23)
CO2: 27 mmol/L (ref 22–32)
Calcium: 9.3 mg/dL (ref 8.9–10.3)
Chloride: 103 mmol/L (ref 98–111)
Creatinine, Ser: 0.96 mg/dL (ref 0.44–1.00)
GFR, Estimated: >60 mL/min (ref >60)
Glucose, Bld: 109 mg/dL — ABNORMAL HIGH (ref 70–99)
Potassium: 4.1 mmol/L (ref 3.5–5.1)
Sodium: 142 mmol/L (ref 135–145)
Total Bilirubin: 0.9 mg/dL (ref 0.3–1.2)
Total Protein: 7.4 g/dL (ref 6.5–8.1)

## 2022-08-18 LAB — CBC
HCT: 37.5 % (ref 36.0–46.0)
Hemoglobin: 11.8 g/dL — ABNORMAL LOW (ref 12.0–15.0)
MCH: 29.5 pg (ref 26.0–34.0)
MCHC: 31.5 g/dL (ref 30.0–36.0)
MCV: 93.8 fL (ref 80.0–100.0)
Platelets: 141 10*3/uL — ABNORMAL LOW (ref 150–400)
RBC: 4 MIL/uL (ref 3.87–5.11)
RDW: 12.2 % (ref 11.5–15.5)
WBC: 2.3 10*3/uL — ABNORMAL LOW (ref 4.0–10.5)
nRBC: 0 % (ref 0.0–0.2)

## 2022-08-18 LAB — URINALYSIS, ROUTINE W REFLEX MICROSCOPIC
Bilirubin Urine: NEGATIVE
Glucose, UA: NEGATIVE mg/dL
Hgb urine dipstick: NEGATIVE
Ketones, ur: NEGATIVE mg/dL
Leukocytes,Ua: NEGATIVE
Nitrite: NEGATIVE
Protein, ur: NEGATIVE mg/dL
Specific Gravity, Urine: 1.01 (ref 1.005–1.030)
pH: 5 (ref 5.0–8.0)

## 2022-08-18 LAB — RESP PANEL BY RT-PCR (RSV, FLU A&B, COVID)  RVPGX2
Influenza A by PCR: NEGATIVE
Influenza B by PCR: NEGATIVE
Resp Syncytial Virus by PCR: NEGATIVE
SARS Coronavirus 2 by RT PCR: NEGATIVE

## 2022-08-18 LAB — TROPONIN I (HIGH SENSITIVITY): Troponin I (High Sensitivity): 4 ng/L (ref <18)

## 2022-08-18 LAB — MAGNESIUM: Magnesium: 2.4 mg/dL (ref 1.7–2.4)

## 2022-08-18 MED ORDER — ACETAMINOPHEN 325 MG PO TABS
650.0000 mg | ORAL_TABLET | Freq: Once | ORAL | Status: AC
  Administered 2022-08-18: 650 mg via ORAL
  Filled 2022-08-18: qty 2

## 2022-08-18 MED ORDER — LACTATED RINGERS IV BOLUS
1000.0000 mL | Freq: Once | INTRAVENOUS | Status: AC
  Administered 2022-08-18: 1000 mL via INTRAVENOUS

## 2022-08-18 NOTE — Patient Outreach (Signed)
  Care Coordination   Collaboration  Visit Note   08/15/2022 Name: Erika Murphy MRN: [id number] DOB: [birth year]  Erika Murphy is a 72 y.o. year old female who sees Baxley, Luanna Cole, MD for primary care. I  engaged with [name] from PCP office  What matters to the patients health and wellness today?   Pt was not engaged during this encounter    SDOH assessments and interventions completed:  No     Care Coordination Interventions:  Yes, provided  Interventions Today    Flowsheet Row Most Recent Value  Chronic Disease   Chronic disease during today's visit Other  [Multiple sclerosis and anxiety]  General Interventions   General Interventions Discussed/Reviewed Communication with  Communication with --  [LCSW collaborated with PCP office regarding patient needs and barriers to care. Strategies to assist pt with obtaining DME discussed]       Follow up plan:  LCSW will f/up with PCP office regarding next steps    Encounter Outcome:  Pt. Visit Completed   Jenel Lucks, MSW, LCSW [facility] Care Management [facility]  [facility] [email or web address] Phone [phone] 5:55 AM

## 2022-08-18 NOTE — Discharge Instructions (Addendum)
Thank you for allowing [place] to be a part of your care today.   Your chest CT scan did not demonstrate acute pneumonia.  There is likely scarring and chronic lung changes.   IMPRESSION:  1. No acute findings in the chest.  2. Unchanged mild right lower lobe reticular opacities and left  lower lobe subpleural band, likely scarring.  3. Bilateral cylindrical bronchiectasis, likely sequela of infection  or aspiration.  4. Small solid pulmonary nodules measuring up to 3 mm. No follow-up  needed if patient is low-risk. Non-contrast chest CT can be  considered in 12 months if patient is high-risk.    There was also evidence of a 2.2 cm thyroid nodule.  Recommended follow up is a non-emergent ultrasound, which you may arrange with your primary care doctor.   Please schedule a follow up with your PCP.  Return to the ED if you develop sudden worsening of your symptoms or if you have any new concerns.

## 2022-08-18 NOTE — Patient Outreach (Signed)
  Care Coordination   Chart Review/Provider Collaboration  Visit Note   08/18/2022 Name: Erika Murphy MRN: [id number] DOB: 07/26/1950  Erika Murphy is a 72 y.o. year old female who sees Baxley, Luanna Cole, MD for primary care. I  collaborated with Renae Fickle referral coordinator with Dr. Beryle Quant office today.   What matters to the patients health and wellness today?  N/a    Goals Addressed             This Visit's Progress    RN Care Coordination Activities: futher follow up needed       Care Coordination Interventions: Spoke with Renae Fickle referral coordinator with Dr. Beryle Quant office regarding patient's need for a new rollator  Discussed [name] will send an Rx to [facility] today Advised the [facility] [name] is scheduled to call patient on 08/23/22 to offer assistance if needed Sent an in basket message to [facility] BSW with an update regarding PCP will send Rx for a rollator walker    Interventions Today    Flowsheet Row Most Recent Value  General Interventions   General Interventions Discussed/Reviewed Communication with, Durable Medical Equipment (DME)  Durable Medical Equipment (DME) [name]  Communication with PCP/Specialists, Social Work  [name], referral coordinator w/Dr. Woodfin Ganja BSW]          SDOH assessments and interventions completed:  No     Care Coordination Interventions:  Yes, provided   Follow up plan: Follow up call scheduled for 08/23/22 1:00 PM w/[name] BSW     Encounter Outcome:  Pt. Visit Completed

## 2022-08-18 NOTE — Progress Notes (Signed)
Patient Care Team: Margaree Mackintosh, MD as PCP - General (Internal Medicine) Runell Gess, MD as PCP - Cardiology (Cardiology) Josph Macho, MD as Consulting Physician (Oncology)  Visit Date: 08/18/22  Subjective:    Patient ID: Erika Murphy , Female   DOB: [birth year], 73 y.o.    MRN: [id number]   72 y.o. Female presents today for hospital/ED follow-up. She is accompanied by her daughter. Seen in ED on 08/15/22 for periumbilical abdominal pain. She had been having lower abdominal pain for 4 days prior. CT head and pelvis showed no acute intra-abdominal process however does show possibly developing right lower lobe pneumonia. Urine culture showed multiple species. Given IV fluids, Zofran, morphine. Prescribed Z-Pak and has been taking this as directed. Her appetite is still diminished due to epigastric pain and she is still having shortness of breath. Denies cough. She is taking her esomeprazole daily as directed. She eats half a bowl of oatmeal for breakfast and light meals otherwise as well. History of gastric cancer status post partial gastrectomy. She has lost 2 pounds between 08/11/22 and today. Denies urinary symptoms. She urinates 5-6 times daily. Her daughter does not believe she drinks enough water. Last bowel movement was two days ago- not hard.  Past Medical History:  Diagnosis Date   Anxiety    Arthritis    "right shoulder" (10/09/2013)   Asthma    pt denies   B12 deficiency anemia    pt denies   Fibromyalgia    GERD (gastroesophageal reflux disease)    History of blood transfusion 2001   "when I had my stomach cancer"   Hypertension    IBS (irritable bowel syndrome)    Iron deficiency anemia 12/11/2014   Malabsorption of iron 12/11/2014   MS (multiple sclerosis) (HCC) dx'd ~ 1998   Neurogenic bladder    due to MS   Osteopenia    Osteoporosis    PONV (postoperative nausea and vomiting)    Stomach cancer (HCC) 2001   Thyroid nodule    right     Family History   Problem Relation Age of Onset   Heart disease Mother    Diabetes Father    Stomach cancer Brother    Multiple sclerosis Other    Breast cancer Sister        2 sisters breast cancer    Breast cancer Maternal Aunt     Social History   Social History Narrative   Patient is retired.    Patient is married with 3 children.   Patient drinks caffeine daily.       Review of Systems  Constitutional:  Negative for fever and malaise/fatigue.  HENT:  Negative for congestion.   Eyes:  Negative for blurred vision.  Respiratory:  Positive for shortness of breath. Negative for cough.   Cardiovascular:  Negative for chest pain, palpitations and leg swelling.  Gastrointestinal:  Positive for abdominal pain (Epigastric). Negative for vomiting.  Musculoskeletal:  Negative for back pain.  Skin:  Negative for rash.  Neurological:  Negative for loss of consciousness and headaches.        Objective:   Vitals: BP 110/80   Pulse 80   Temp 98.2 F (36.8 C)   Ht 5\' 4"  (1.626 m)   Wt 83 lb (37.6 kg)   SpO2 98%   BMI 14.25 kg/m    Physical Exam Vitals and nursing note reviewed.  Constitutional:      General: She is not in  acute distress.    Appearance: Normal appearance. She is not toxic-appearing.  HENT:     Head: Normocephalic and atraumatic.  Cardiovascular:     Rate and Rhythm: Normal rate and regular rhythm. No extrasystoles are present.    Pulses: Normal pulses.     Heart sounds: Normal heart sounds. No murmur heard.    No friction rub. No gallop.  Pulmonary:     Effort: Pulmonary effort is normal. No respiratory distress.     Breath sounds: Normal breath sounds. No wheezing or rales.  Abdominal:     Comments: Abdomen is scaphoid. Epigastric tenderness upon palpation. No masses.  Skin:    General: Skin is warm and dry.  Neurological:     Mental Status: She is alert and oriented to person, place, and time. Mental status is at baseline.  Psychiatric:        Mood and  Affect: Mood normal.        Behavior: Behavior normal.        Thought Content: Thought content normal.        Judgment: Judgment normal.       Results:   Studies obtained and personally reviewed by me:   Labs:       Component Value Date/Time   NA 138 08/15/2022 1926   NA 144 06/12/2016 1151   NA 142 06/11/2015 1156   K 3.9 08/15/2022 1926   K 4.7 06/12/2016 1151   K 4.3 06/11/2015 1156   CL 105 08/15/2022 1926   CL 107 06/12/2016 1151   CO2 26 08/15/2022 1926   CO2 32 06/12/2016 1151   CO2 29 06/11/2015 1156   GLUCOSE 87 08/15/2022 1926   GLUCOSE 88 06/12/2016 1151   BUN 16 08/15/2022 1926   BUN 11 06/12/2016 1151   BUN 13.5 06/11/2015 1156   CREATININE 1.09 (H) 08/15/2022 1926   CREATININE 0.93 08/04/2022 1118   CREATININE 1.2 (H) 06/11/2015 1156   CALCIUM 9.2 08/15/2022 1926   CALCIUM 9.6 06/12/2016 1151   CALCIUM 9.7 06/11/2015 1156   PROT 7.7 08/15/2022 1926   PROT 7.5 06/12/2016 1151   PROT 7.5 06/11/2015 1156   ALBUMIN 4.1 08/15/2022 1926   ALBUMIN 3.8 06/12/2016 1151   ALBUMIN 3.8 06/11/2015 1156   AST 38 08/15/2022 1926   AST 26 07/07/2022 1218   AST 30 06/11/2015 1156   ALT 35 08/15/2022 1926   ALT 18 07/07/2022 1218   ALT 26 06/12/2016 1151   ALT 23 06/11/2015 1156   ALKPHOS 69 08/15/2022 1926   ALKPHOS 97 (H) 06/12/2016 1151   ALKPHOS 111 06/11/2015 1156   BILITOT 0.7 08/15/2022 1926   BILITOT 0.7 07/07/2022 1218   BILITOT 1.03 06/11/2015 1156   GFRNONAA 53 (L) 08/15/2022 1926   GFRNONAA >60 07/07/2022 1218   GFRNONAA 73 01/16/2020 0949   GFRAA 85 01/16/2020 0949     Lab Results  Component Value Date   WBC 2.3 (L) 08/15/2022   HGB 12.6 08/15/2022   HCT 39.8 08/15/2022   MCV 95.4 08/15/2022   PLT 147 (L) 08/15/2022    Lab Results  Component Value Date   CHOL 139 08/04/2022   HDL 51 08/04/2022   LDLCALC 71 08/04/2022   TRIG 88 08/04/2022   CHOLHDL 2.7 08/04/2022    Lab Results  Component Value Date   HGBA1C 5.0 05/13/2021      Lab Results  Component Value Date   TSH 0.63 08/04/2022      Assessment &  Plan:   Epigastric pain: . She now has an appointment with Dr. Elnoria Howard, Gastroenterologist, 1:45pm on Monday, August 19. Decreased appetite which is concerning.    Near Syncope: She had a near syncopal episode in the bathroom, in which she became lightheaded and unbalanced. She was evaluated and is stable. Labs were postponed. She was sent to the ED to be evaluated.I think she has volume depletion and may need IVF and possibly admission to hospital. She appears frail and is ambulating slowly  Remote Hx on GI malignancy followed by Dr. Myna Hidalgo. Hx of Billroth II procedure. Dr. Elnoria Howard is gastroenterologist. Has appt for Monday to see him.  Hx of Multiple sclerosis- currently not on treatment. Was dx in 1998. Has gait disorder and generalized weakness. Pain treated with Lyrica.  Fraility  Hx of B12 deficiency  Hx of iron deficiency  Hx of neurogenic bladder- unable to get specimen today to rule out UTI- needs cath specimen  See CPE from 01/28/21 for more details. Referring her to ED for urgent evaluation. Suspect volume depletion and possible UTI. Needs cath specimen. Has appt with Dr. Elnoria Howard on Monday.  I,Alexander Ruley,acting as a Neurosurgeon for Margaree Mackintosh, MD.,have documented all relevant documentation on the behalf of Margaree Mackintosh, MD,as directed by  Margaree Mackintosh, MD while in the presence of Margaree Mackintosh, MD.   I, Margaree Mackintosh, MD, have reviewed all documentation for this visit. The documentation on 08/18/22 for the exam, diagnosis, procedures, and orders are all accurate and complete.

## 2022-08-18 NOTE — ED Provider Notes (Signed)
Accepted handoff at shift change from [facility], [place]. Please see prior provider note for more detail.   Briefly: Patient is 72 y.o. presents to the ED for near syncopal episode.  She was seen 2 days ago and had imaging done without acute finding with exception of potential developing pneumonia.  She followed up with PCP, had near syncopal episode when she went to give urine sample.  Still has some abdominal pain and decreased PO intake.   DDX: concern for orthostatic or postural hypotension, situational near syncope  Plan: follow up on labs, chest x-ray.      Results for orders placed or performed during the hospital encounter of 08/18/22  Comprehensive metabolic panel  Result Value Ref Range   Sodium 142 135 - 145 mmol/L   Potassium 4.1 3.5 - 5.1 mmol/L   Chloride 103 98 - 111 mmol/L   CO2 27 22 - 32 mmol/L   Glucose, Bld 109 (H) 70 - 99 mg/dL   BUN 8 8 - 23 mg/dL   Creatinine, Ser 1.61 0.44 - 1.00 mg/dL   Calcium 9.3 8.9 - 09.6 mg/dL   Total Protein 7.4 6.5 - 8.1 g/dL   Albumin 3.8 3.5 - 5.0 g/dL   AST 43 (H) 15 - 41 U/L   ALT 36 0 - 44 U/L   Alkaline Phosphatase 68 38 - 126 U/L   Total Bilirubin 0.9 0.3 - 1.2 mg/dL   GFR, Estimated >04 >54 mL/min   Anion gap 12 5 - 15  CBC  Result Value Ref Range   WBC 2.3 (L) 4.0 - 10.5 K/uL   RBC 4.00 3.87 - 5.11 MIL/uL   Hemoglobin 11.8 (L) 12.0 - 15.0 g/dL   HCT 09.8 11.9 - 14.7 %   MCV 93.8 80.0 - 100.0 fL   MCH 29.5 26.0 - 34.0 pg   MCHC 31.5 30.0 - 36.0 g/dL   RDW 82.9 56.2 - 13.0 %   Platelets 141 (L) 150 - 400 K/uL   nRBC 0.0 0.0 - 0.2 %  Magnesium  Result Value Ref Range   Magnesium 2.4 1.7 - 2.4 mg/dL  Urinalysis, Routine w reflex microscopic -Urine, Clean Catch  Result Value Ref Range   Color, Urine YELLOW YELLOW   APPearance CLEAR CLEAR   Specific Gravity, Urine 1.010 1.005 - 1.030   pH 5.0 5.0 - 8.0   Glucose, UA NEGATIVE NEGATIVE mg/dL   Hgb urine dipstick NEGATIVE NEGATIVE   Bilirubin Urine NEGATIVE NEGATIVE    Ketones, ur NEGATIVE NEGATIVE mg/dL   Protein, ur NEGATIVE NEGATIVE mg/dL   Nitrite NEGATIVE NEGATIVE   Leukocytes,Ua NEGATIVE NEGATIVE   RBC / HPF 0-5 0 - 5 RBC/hpf   WBC, UA 0-5 0 - 5 WBC/hpf   Bacteria, UA RARE (A) NONE SEEN   Squamous Epithelial / HPF 0-5 0 - 5 /HPF   Mucus PRESENT   Troponin I (High Sensitivity)  Result Value Ref Range   Troponin I (High Sensitivity) 4 <18 ng/L   DG Chest 2 View  Result Date: 08/18/2022 CLINICAL DATA:  Decreasing appetite. Previous history of gastric carcinoma with resection EXAM: CHEST - 2 VIEW COMPARISON:  X-ray 01/22/2020 FINDINGS: Hyperinflation. No consolidation, pneumothorax or effusion. No edema. Normal cardiopericardial silhouette. Calcified aorta surgical clips in the upper abdomen bilateral shoulder arthroplasty seen at the edge of the imaging field. IMPRESSION: Hyperinflation.  No acute cardiopulmonary disease Electronically Signed   By: Karen Kays M.D.   On: 08/18/2022 15:00   CT ABDOMEN PELVIS  W CONTRAST  Result Date: 08/15/2022 CLINICAL DATA:  Acute nonlocalized abdominal pain. Periumbilical abdominal pain. Lower abdominal pain. Left lower quadrant pain. Symptoms for 4 days. EXAM: CT ABDOMEN AND PELVIS WITH CONTRAST TECHNIQUE: Multidetector CT imaging of the abdomen and pelvis was performed using the standard protocol following bolus administration of intravenous contrast. RADIATION DOSE REDUCTION: This exam was performed according to the departmental dose-optimization program which includes automated exposure control, adjustment of the mA and/or kV according to patient size and/or use of iterative reconstruction technique. CONTRAST:  80mL OMNIPAQUE IOHEXOL 300 MG/ML  SOLN COMPARISON:  Abdominal radiographs 02/27/2022. CT abdomen and pelvis 02/13/2020 FINDINGS: Lower chest: There is a focal area of poorly defined perivascular tree-in-bud infiltration in the right lower lung. This may represent early acute pneumonia. Heart is enlarged.  Hepatobiliary: No focal liver abnormality is seen. No gallstones, gallbladder wall thickening, or biliary dilatation. Pancreas: Unremarkable. No pancreatic ductal dilatation or surrounding inflammatory changes. Spleen: Normal in size without focal abnormality. Adrenals/Urinary Tract: Adrenal glands are unremarkable. Kidneys are normal, without renal calculi, focal lesion, or hydronephrosis. Bladder is unremarkable. Stomach/Bowel: Stomach is decompressed. Surgical clips in the upper abdomen. Small bowel and colon are decompressed with scattered stool in the colon. No definitive evidence of wall thickening or inflammatory change. Appendix is not identified. Vascular/Lymphatic: Aortic atherosclerosis. No enlarged abdominal or pelvic lymph nodes. Reproductive: Visualization of the low pelvis is limited by streak artifact from right hip arthroplasty. No pelvic masses identified. Other: No free air or free fluid in the abdomen. Abdominal wall musculature appears intact. Musculoskeletal: Degenerative changes. No destructive bone lesions. Right hip arthroplasty. IMPRESSION: 1. No evidence suggesting bowel obstruction or inflammation. Bowel are mostly decompressed, limiting evaluation. 2. Small focal area of infiltration demonstrated in the right lung base possibly representing early pneumonia. 3. Cardiac enlargement. 4. Aortic atherosclerosis. Electronically Signed   By: Burman Nieves M.D.   On: 08/15/2022 22:18    Physical Exam  BP (!) 150/69 (BP Location: Right Arm)   Pulse 63   Temp 97.9 F (36.6 C) (Oral)   Resp 16   SpO2 100%   Physical Exam  Procedures  Procedures  ED Course / MDM    Medical Decision Making Amount and/or Complexity of Data Reviewed Labs: ordered. Radiology: ordered.  Risk OTC drugs.   3:32 PM CT chest wo contrast ordered to follow up on previously found developing pneumonia.   3:43 PM Resp panel returned and is negative for COVID, influenza, and RSV.  UA without evidence  of infection.  Labs are reassuring.   5:25 PM CT scan without acute findings.  There is likely scarring and bronchiectasis.  CT also demonstrates small solid pulmonary nodules measuring up to 3 mm and right thyroid nodule measuring 2.2 cm.   Patient has not had any further near syncopal episodes and reports feeling well at this time.  Patient was able to ambulate to the bathroom without difficulty.  I feel she is appropriate for discharge home.    The patient has been appropriately medically screened and/or stabilized in the ED. I have low suspicion for any other emergent medical condition which would require further screening, evaluation or treatment in the ED or require inpatient management. At time of discharge the patient is hemodynamically stable and in no acute distress. I have discussed work-up results and diagnosis with patient and answered all questions. Patient is agreeable with discharge plan. We discussed strict return precautions for returning to the emergency department and they verbalized understanding.  Lenard Simmer, PA-C 08/18/22 1751    Wynetta Fines, MD 08/18/22 2352

## 2022-08-18 NOTE — ED Provider Notes (Signed)
[place] EMERGENCY DEPARTMENT AT [facility] Provider Note   CSN: [id number] Arrival date & time: 08/18/22  1141     History  Chief Complaint  Patient presents with   Dizziness   Decreased Appetite   Abdominal Pain    KAMONI ARMANI is a 72 y.o. female.  73 year old female presents today for concern of near syncopal episode.  She was recently seen 2 days ago and had an imaging done without any acute finding with the exception of potential developing pneumonia.  She followed up with her PCP today but had a near syncopal episode when she went to the bathroom.  She denies cough or fever.  Still has some abdominal pain.  Endorses decreased p.o. intake.  Sisters are at bedside.  The history is provided by the patient. No language interpreter was used.       Home Medications Prior to Admission medications   Medication Sig Start Date End Date Taking? Authorizing Provider  albuterol (VENTOLIN HFA) 108 (90 Base) MCG/ACT inhaler INHALE 2 PUFFS INTO THE LUNGS EVERY 6 HOURS AS NEEDED FOR SHORTNESS OF BREATH/WHEEZING 07/04/22   Margaree Mackintosh, MD  ALPRAZolam Prudy Feeler) 0.5 MG tablet Take one half tab twice daily as needed for anxiety. 06/08/21   Margaree Mackintosh, MD  amLODipine (NORVASC) 2.5 MG tablet TAKE 1 TABLET BY MOUTH EVERY DAY 01/02/22   Margaree Mackintosh, MD  azithromycin (ZITHROMAX) 250 MG tablet Take 1 tablet (250 mg total) by mouth daily. Take first 2 tablets together, then 1 every day until finished. 08/15/22   Henderly, Britni A, PA-C  cephALEXin (KEFLEX) 500 MG capsule Take 1 capsule (500 mg total) by mouth 2 (two) times daily. 07/17/22   Erenest Blank, NP  cyanocobalamin (VITAMIN B12) 1000 MCG/ML injection INJECT 1 ML (1,000 MCG TOTAL) INTO THE MUSCLE ONCE A MONTH AS DIRECTED 01/02/22   Margaree Mackintosh, MD  denosumab (PROLIA) 60 MG/ML SOSY injection Inject 60 mg into the skin every 6 (six) months.    [provider]  esomeprazole (NEXIUM) 40 MG capsule TAKE 1 CAPSULE (40 MG  TOTAL) BY MOUTH DAILY AT 12 NOON. 01/02/22   Baxley, Luanna Cole, MD  meclizine (ANTIVERT) 25 MG tablet Take 1 tablet (25 mg total) by mouth 3 (three) times daily as needed for dizziness. 12/22/19   Margaree Mackintosh, MD  meloxicam (MOBIC) 7.5 MG tablet Take 1 tablet (7.5 mg total) by mouth daily. 02/27/22   Margaree Mackintosh, MD  methocarbamol (ROBAXIN) 500 MG tablet Take 1 tablet (500 mg total) by mouth every 8 (eight) hours as needed for muscle spasms. 05/27/21   Beverely Low, MD  Multiple Vitamins-Minerals (MULTIVITAMIN GUMMIES WOMENS PO) Take 1 tablet by mouth daily.    [provider]  pregabalin (LYRICA) 50 MG capsule ONE CAPSULE 3 TIMES A DAY BY MOUTH 04/09/22   Margaree Mackintosh, MD  rosuvastatin (CRESTOR) 5 MG tablet TAKE 1 TABLET (5 MG TOTAL) BY MOUTH DAILY. 08/14/22   Margaree Mackintosh, MD      Allergies    Patient has no known allergies.    Review of Systems   Review of Systems  Constitutional:  Negative for chills and fever.  Respiratory:  Negative for cough and shortness of breath.   Cardiovascular:  Negative for chest pain.  Gastrointestinal:  Positive for abdominal pain and nausea. Negative for vomiting.  Neurological:  Negative for syncope and light-headedness.  All other systems reviewed and are negative.  Physical Exam Updated Vital Signs BP (!) 150/69 (BP Location: Right Arm)   Pulse 63   Temp 97.9 F (36.6 C) (Oral)   Resp 16   SpO2 100%  Physical Exam Vitals and nursing note reviewed.  Constitutional:      General: She is not in acute distress.    Appearance: Normal appearance. She is not ill-appearing.  HENT:     Head: Normocephalic and atraumatic.     Nose: Nose normal.  Eyes:     General: No scleral icterus.    Extraocular Movements: Extraocular movements intact.     Conjunctiva/sclera: Conjunctivae normal.  Cardiovascular:     Rate and Rhythm: Normal rate and regular rhythm.     Heart sounds: Normal heart sounds.  Pulmonary:     Effort: Pulmonary effort is  normal. No respiratory distress.     Breath sounds: Normal breath sounds. No wheezing or rales.  Abdominal:     General: There is no distension.     Tenderness: There is no abdominal tenderness. There is no guarding.  Musculoskeletal:        General: Normal range of motion.     Cervical back: Normal range of motion.  Skin:    General: Skin is warm and dry.  Neurological:     General: No focal deficit present.     Mental Status: She is alert. Mental status is at baseline.     ED Results / Procedures / Treatments   Labs (all labs ordered are listed, but only abnormal results are displayed) Labs Reviewed  COMPREHENSIVE METABOLIC PANEL - Abnormal; Notable for the following components:      Result Value   Glucose, Bld 109 (*)    AST 43 (*)    All other components within normal limits  CBC - Abnormal; Notable for the following components:   WBC 2.3 (*)    Hemoglobin 11.8 (*)    Platelets 141 (*)    All other components within normal limits  RESP PANEL BY RT-PCR (RSV, FLU A&B, COVID)  RVPGX2  MAGNESIUM  URINALYSIS, ROUTINE W REFLEX MICROSCOPIC  TROPONIN I (HIGH SENSITIVITY)  TROPONIN I (HIGH SENSITIVITY)    EKG None  Radiology No results found.  Procedures Procedures    Medications Ordered in ED Medications  acetaminophen (TYLENOL) tablet 650 mg (650 mg Oral Given 08/18/22 1346)  lactated ringers bolus 1,000 mL (1,000 mLs Intravenous New Bag/Given 08/18/22 1346)    ED Course/ Medical Decision Making/ A&P                                 Medical Decision Making Amount and/or Complexity of Data Reviewed Labs: ordered. Radiology: ordered.  Risk OTC drugs.   72 year old female presents today for concern of near syncopal episode from PCP office.  She overall is well-appearing without acute distress.  Abdomen is without distention or tenderness or guarding.  Will obtain EKG, place patient on telemetry, obtain labs.  Chest x-ray ordered to evaluate for the developing  pneumonia that was noted on the CT.  If this is negative given the recent CT we will follow-up with CT chest to further evaluate. CBC shows leukopenia which is chronic.  Hemoglobin at 11.8 which is chronic.  CMP with glucose of 109 otherwise without acute concern.  Troponin of 4 initially.  UA without evidence of UTI.  Magnesium 2.4.  Patient at the time of signout is pending chest x-ray,  respiratory panel.  Signed out to oncoming provider to follow-up on these studies.   Final Clinical Impression(s) / ED Diagnoses Final diagnoses:  Near syncope    Rx / DC Orders ED Discharge Orders     None         Marita Kansas, PA-C 08/18/22 1512    Franne Forts, DO 08/19/22 [phone]

## 2022-08-18 NOTE — ED Triage Notes (Signed)
Pt came in via POV d/t the last week not being able to eat, decreased appetite, denies n/v but does endorse having 70% of her stomach removed d/tr Stomach CA 20 yrs ago. A/Ox4, rates her 7/10, also endorses dizziness.

## 2022-08-18 NOTE — Patient Instructions (Addendum)
She has appt with Dr, Elnoria Howard on Monday. Sent to ED for urgent evalaution for fraility, weakness, epigastric pain, possible UTI in setting of MS. Hx of GI malignancy seen by Dr. Myna Hidalgo with no recurrence s/p Bilroth II surgery years ago.

## 2022-08-21 DIAGNOSIS — R1033 Periumbilical pain: Secondary | ICD-10-CM | POA: Diagnosis not present

## 2022-08-21 DIAGNOSIS — R531 Weakness: Secondary | ICD-10-CM | POA: Diagnosis not present

## 2022-08-21 DIAGNOSIS — R634 Abnormal weight loss: Secondary | ICD-10-CM | POA: Diagnosis not present

## 2022-08-23 ENCOUNTER — Telehealth: Payer: Self-pay

## 2022-08-23 MED ORDER — LIDOCAINE VISCOUS HCL 2 % MT SOLN
15.0000 mL | Freq: Once | OROMUCOSAL | Status: AC
Start: 2022-08-23 — End: 2022-08-23
  Administered 2022-08-23: 15 mL via OROMUCOSAL

## 2022-08-23 MED ORDER — ALUM & MAG HYDROXIDE-SIMETH 200-200-20 MG/5ML PO SUSP
15.0000 mL | Freq: Once | ORAL | Status: AC
Start: 2022-08-23 — End: 2022-08-18
  Administered 2022-08-18: 15 mL via ORAL

## 2022-08-23 NOTE — Addendum Note (Signed)
Addended by: Gregery Na on: 08/23/2022 09:16 AM   Modules accepted: Orders

## 2022-08-23 NOTE — Patient Outreach (Signed)
  Care Coordination   08/23/2022 Name: Erika Murphy MRN: [id number] DOB: 1950-03-05   Care Coordination Outreach Attempts:  An unsuccessful telephone outreach was attempted for a scheduled appointment today.  Follow Up Plan:  Additional outreach attempts will be made to offer the patient care coordination information and services.   Encounter Outcome:  No Answer   Care Coordination Interventions:  No, not indicated    [name], BSW, CDP Social Worker, Certified Dementia Practitioner [facility] Care Management  Care Coordination [phone]

## 2022-08-24 ENCOUNTER — Encounter: Payer: Self-pay | Admitting: Gastroenterology

## 2022-08-24 DIAGNOSIS — K3189 Other diseases of stomach and duodenum: Secondary | ICD-10-CM | POA: Diagnosis not present

## 2022-08-24 DIAGNOSIS — K2970 Gastritis, unspecified, without bleeding: Secondary | ICD-10-CM | POA: Diagnosis not present

## 2022-08-24 DIAGNOSIS — Z934 Other artificial openings of gastrointestinal tract status: Secondary | ICD-10-CM | POA: Diagnosis not present

## 2022-08-24 DIAGNOSIS — R634 Abnormal weight loss: Secondary | ICD-10-CM | POA: Diagnosis not present

## 2022-08-24 DIAGNOSIS — K317 Polyp of stomach and duodenum: Secondary | ICD-10-CM | POA: Diagnosis not present

## 2022-08-24 DIAGNOSIS — K319 Disease of stomach and duodenum, unspecified: Secondary | ICD-10-CM | POA: Diagnosis not present

## 2022-08-24 DIAGNOSIS — R1033 Periumbilical pain: Secondary | ICD-10-CM | POA: Diagnosis not present

## 2022-08-24 DIAGNOSIS — D371 Neoplasm of uncertain behavior of stomach: Secondary | ICD-10-CM | POA: Diagnosis not present

## 2022-08-24 DIAGNOSIS — K2930 Chronic superficial gastritis without bleeding: Secondary | ICD-10-CM | POA: Diagnosis not present

## 2022-08-24 DIAGNOSIS — K219 Gastro-esophageal reflux disease without esophagitis: Secondary | ICD-10-CM | POA: Diagnosis not present

## 2022-08-24 DIAGNOSIS — Z980 Intestinal bypass and anastomosis status: Secondary | ICD-10-CM | POA: Diagnosis not present

## 2022-08-28 DIAGNOSIS — H02885 Meibomian gland dysfunction left lower eyelid: Secondary | ICD-10-CM | POA: Diagnosis not present

## 2022-09-01 ENCOUNTER — Ambulatory Visit: Payer: Self-pay

## 2022-09-01 NOTE — Patient Outreach (Signed)
  Care Coordination   Initial Visit Note   09/01/2022 Name: Erika Murphy MRN: [id number] DOB: 1950-01-07  Erika Murphy is a 72 y.o. year old female who sees Baxley, Luanna Cole, MD for primary care. I spoke with  Gilmore Laroche by phone today.  What matters to the patients health and wellness today?  To begin feeling better    Goals Addressed             This Visit's Progress    COMPLETED: Care Coordination Activities       Care Coordination Interventions: Determined the patient has obtained a rollator from [employer] Discussed the patient previously dropped off a document to request Medicaid pay her daughter to be her caregiver; patient reports she has since spoken with her Medicaid caseworker to determine she has Medicaid MQB which does not cover desired services Reviewed the patients children take turns staying with her to assist with care needs including transportation to medical appointments Education provided on alternative options such as adult day program and placement - patient is not interested in either of these options at this time Scheduled the patient to speak with RN Care Manager [name] Little on 9/10 Collaboration with patients primary care providers office as well as RN Care Manager to advise of interventions and plan for patient to remain in the home with family support         SDOH assessments and interventions completed:  Yes  SDOH Interventions Today    Flowsheet Row Most Recent Value  SDOH Interventions   Food Insecurity Interventions Other (Comment)  [has FNS,  budgetting to accomodate rising prices]  Housing Interventions Intervention Not Indicated  Transportation Interventions Intervention Not Indicated  [Son assists with transportation]        Care Coordination Interventions:  Yes, provided   Interventions Today    Flowsheet Row Most Recent Value  Chronic Disease   Chronic disease during today's visit Other  [Multiple Sclerosis]  General  Interventions   General Interventions Discussed/Reviewed General Interventions Discussed, Referral to Nurse, Durable Medical Equipment (DME), Communication with  [Scheduled to speak with RN Care Manager 9/10]  Durable Medical Equipment (DME) [name]  [confirmed walker obtained]  Communication with PCP/Specialists  [Advised primary care provider walker has been received]        Follow up plan: No further intervention required. The patient will be contacted by RN Care Manager to assess for ongoing care coordination needs.    Encounter Outcome:  Pt. Visit Completed   [name], BSW, CDP Social Worker, Certified Dementia Practitioner [facility] Care Management  Care Coordination [phone]

## 2022-09-01 NOTE — Patient Instructions (Signed)
Visit Information  Thank you for taking time to visit with me today. Please don't hesitate to contact me if I can be of assistance to you.   Following are the goals we discussed today:  - Contact your primary care providers office as needed  Your next appointment is by telephone on 9/10 at 12:00 with RN Care Manager [name] Little  Please call the care guide team at [phone] if you need to cancel or reschedule your appointment.   If you are experiencing a Mental Health or Behavioral Health Crisis or need someone to talk to, please call 1-800-273-TALK (toll free, 24 hour hotline) go to [facility] [address] [place] [phone]) call 911  Patient verbalizes understanding of instructions and care plan provided today and agrees to view in MyChart. Active MyChart status and patient understanding of how to access instructions and care plan via MyChart confirmed with patient.     No further follow up required: Please contact me as needed  [name], BSW, CDP Social Worker, Certified Dementia Practitioner [facility] Care Management  Care Coordination [phone]

## 2022-09-11 ENCOUNTER — Telehealth: Payer: Self-pay | Admitting: Internal Medicine

## 2022-09-11 NOTE — Telephone Encounter (Signed)
Patient called and said that a few weeks ago Dr Lenord Fellers talked about sending patient to surgeon about cyst and said she hasn't heard anything about scheduling yet. Please advise

## 2022-09-12 ENCOUNTER — Ambulatory Visit: Payer: Self-pay

## 2022-09-12 ENCOUNTER — Other Ambulatory Visit: Payer: Self-pay

## 2022-09-12 DIAGNOSIS — Z713 Dietary counseling and surveillance: Secondary | ICD-10-CM

## 2022-09-12 DIAGNOSIS — R634 Abnormal weight loss: Secondary | ICD-10-CM

## 2022-09-13 ENCOUNTER — Emergency Department (HOSPITAL_COMMUNITY)
Admission: EM | Admit: 2022-09-13 | Discharge: 2022-09-14 | Disposition: A | Discharge status: Home / Self Care | Payer: Medicare HMO | Attending: Emergency Medicine | Admitting: Emergency Medicine

## 2022-09-13 ENCOUNTER — Other Ambulatory Visit: Payer: Self-pay

## 2022-09-13 ENCOUNTER — Encounter (HOSPITAL_COMMUNITY): Payer: Self-pay

## 2022-09-13 ENCOUNTER — Emergency Department (HOSPITAL_COMMUNITY): Payer: Medicare HMO

## 2022-09-13 DIAGNOSIS — Z20822 Contact with and (suspected) exposure to covid-19: Secondary | ICD-10-CM | POA: Insufficient documentation

## 2022-09-13 DIAGNOSIS — R0602 Shortness of breath: Secondary | ICD-10-CM | POA: Insufficient documentation

## 2022-09-13 DIAGNOSIS — I700 Atherosclerosis of aorta: Secondary | ICD-10-CM | POA: Diagnosis not present

## 2022-09-13 DIAGNOSIS — R5383 Other fatigue: Secondary | ICD-10-CM | POA: Insufficient documentation

## 2022-09-13 DIAGNOSIS — Z96612 Presence of left artificial shoulder joint: Secondary | ICD-10-CM | POA: Diagnosis not present

## 2022-09-13 DIAGNOSIS — R0789 Other chest pain: Secondary | ICD-10-CM | POA: Diagnosis not present

## 2022-09-13 DIAGNOSIS — R079 Chest pain, unspecified: Secondary | ICD-10-CM | POA: Insufficient documentation

## 2022-09-13 DIAGNOSIS — Z96611 Presence of right artificial shoulder joint: Secondary | ICD-10-CM | POA: Diagnosis not present

## 2022-09-13 LAB — CBC
HCT: 36.4 % (ref 36.0–46.0)
Hemoglobin: 11.6 g/dL — ABNORMAL LOW (ref 12.0–15.0)
MCH: 30.2 pg (ref 26.0–34.0)
MCHC: 31.9 g/dL (ref 30.0–36.0)
MCV: 94.8 fL (ref 80.0–100.0)
Platelets: 160 10*3/uL (ref 150–400)
RBC: 3.84 MIL/uL — ABNORMAL LOW (ref 3.87–5.11)
RDW: 12.3 % (ref 11.5–15.5)
WBC: 2.4 10*3/uL — ABNORMAL LOW (ref 4.0–10.5)
nRBC: 0 % (ref 0.0–0.2)

## 2022-09-13 LAB — HEPATIC FUNCTION PANEL
ALT: 29 U/L (ref 0–44)
AST: 32 U/L (ref 15–41)
Albumin: 4.2 g/dL (ref 3.5–5.0)
Alkaline Phosphatase: 62 U/L (ref 38–126)
Bilirubin, Direct: 0.1 mg/dL (ref 0.0–0.2)
Indirect Bilirubin: 0.3 mg/dL (ref 0.3–0.9)
Total Bilirubin: 0.4 mg/dL (ref 0.3–1.2)
Total Protein: 8 g/dL (ref 6.5–8.1)

## 2022-09-13 LAB — BASIC METABOLIC PANEL
Anion gap: 6 (ref 5–15)
BUN: 12 mg/dL (ref 8–23)
CO2: 27 mmol/L (ref 22–32)
Calcium: 8.9 mg/dL (ref 8.9–10.3)
Chloride: 108 mmol/L (ref 98–111)
Creatinine, Ser: 0.93 mg/dL (ref 0.44–1.00)
GFR, Estimated: >60 mL/min (ref >60)
Glucose, Bld: 86 mg/dL (ref 70–99)
Potassium: 4.2 mmol/L (ref 3.5–5.1)
Sodium: 141 mmol/L (ref 135–145)

## 2022-09-13 LAB — TROPONIN I (HIGH SENSITIVITY): Troponin I (High Sensitivity): 3 ng/L (ref <18)

## 2022-09-13 NOTE — Patient Instructions (Signed)
Visit Information  Thank you for taking time to visit with me today. Please don't hesitate to contact me if I can be of assistance to you.   Following are the goals we discussed today:   Goals Addressed             This Visit's Progress    COMPLETED: RN Care Coordination Activities: futher follow up needed       Care Coordination Interventions: Assessed for DME needs, determined patient received her walker from [employer] and is utilizing it for ambulation outside of her home Discussed patient is using a cane when walking indoors, she denies having any falls or near falls  Fall education provided to patient  Instructed patient to use her DME at all times to help prevent falls  Instructed patient to notify her doctor of any/all falls when they occur     To have abdominal pain further evaluated and treated       Care Coordination Interventions: Evaluation of current treatment plan related to abdominal pain  and patient's adherence to plan as established by provider Reviewed and discussed with patient her recent ED visit for near syncope and weakness  Determined patient began to experience abdominal pain for approximately 2 weeks, resulting in the inability to stay hydrated and eat complete meals  Reviewed and discussed with patient her in person visit with Dr. Jeani Hawking, GI MD  Review of patient status, including review of consultant's reports, relevant laboratory and other test results, and medications completed Determined patient experienced severe constipation x 1 week with use of Cholestyramine as prescribed by Dr. Gregery Na with Dr. Elnoria Howard regarding patient's reported SE, reply received and patient advised of the following; [name],  Thanks for reaching out.  If she has severe constipation she can hold off until her bowels normalized.  Miralax is fine for now.  If she has diarrhea again, then she can use 1 gram every day.   Dr. Elnoria Howard  Discussed with patient she may  benefit from having a nutritionist consultation, collaborated with Dr. Lenord Fellers requesting a referral Determined patient's health plan does not cover this service, she will contact her health plan to inquire about covered benefits for a nutritionist Instructed patient to continue increasing her protein supplement as directed          Our next appointment is by telephone on 09/26/22 at 11:00 AM  Please call the care guide team at [phone] if you need to cancel or reschedule your appointment.   If you are experiencing a Mental Health or Behavioral Health Crisis or need someone to talk to, please call 1-800-273-TALK (toll free, 24 hour hotline)  Patient verbalizes understanding of instructions and care plan provided today and agrees to view in MyChart. Active MyChart status and patient understanding of how to access instructions and care plan via MyChart confirmed with patient.     Delsa Sale RN BSN CCM [place]  [facility], [facility] Nurse Care Coordinator  Direct Dial: [phone] Website: [email or web address]

## 2022-09-13 NOTE — ED Provider Notes (Signed)
[facility]-EMERGENCY DEPT [facility] Emergency Department Provider Note MRN:  [id number]  Arrival date & time: 09/14/22     Chief Complaint   Shortness of Breath, Chest Pain, and Leg Pain   History of Present Illness   Erika Murphy is a 72 y.o. year-old female presents to the ED with chief complaint of SOB and fatigue.  States that she felt like she was going to pass out.  States that these symptoms started over the past day or two.  She states that she has had a cough or fever.  States that breathing feels a little better right now.  Reports seasonal allergies .  History provided by patient.   Review of Systems  Pertinent positive and negative review of systems noted in HPI.    Physical Exam   Vitals:   09/13/22 2207 09/14/22 0232  BP: 136/68 138/68  Pulse: 61 61  Resp: 13 16  Temp: 98.2 F (36.8 C) 98 F (36.7 C)  SpO2: 100% 99%    CONSTITUTIONAL:  non toxic-appearing, NAD NEURO:  Alert and oriented x 3, CN 3-12 grossly intact EYES:  eyes equal and reactive ENT/NECK:  Supple, no stridor  CARDIO:  normal rate, regular rhythm, appears well-perfused  PULM:  No respiratory distress, CTAB GI/GU:  non-distended, no focal tenderness MSK/SPINE:  No gross deformities, no edema, moves all extremities  SKIN:  no rash, atraumatic   *Additional and/or pertinent findings included in MDM below  Diagnostic and Interventional Summary    EKG Interpretation Date/Time:  Wednesday September 13 2022 19:01:15 EDT Ventricular Rate:  73 PR Interval:  167 QRS Duration:  85 QT Interval:  386 QTC Calculation: 426 R Axis:   94  Text Interpretation: Right and left arm electrode reversal, interpretation assumes no reversal Sinus rhythm Right axis deviation Nonspecific T abnrm, anterolateral leads Artifact Confirmed by Tilden Fossa [phone]) on 09/14/2022 12:21:49 AM       Labs Reviewed  CBC - Abnormal; Notable for the following components:      Result Value   WBC 2.4 (*)    RBC  3.84 (*)    Hemoglobin 11.6 (*)    All other components within normal limits  BASIC METABOLIC PANEL  HEPATIC FUNCTION PANEL  TROPONIN I (HIGH SENSITIVITY)  TROPONIN I (HIGH SENSITIVITY)    DG Chest 2 View  Final Result      Medications - No data to display   Procedures  /  Critical Care Procedures  ED Course and Medical Decision Making  I have reviewed the triage vital signs, the nursing notes, and pertinent available records from the EMR.  Social Determinants Affecting Complexity of Care: Patient has no clinically significant social determinants affecting this chief complaint..   ED Course:    Medical Decision Making Patient here with some chest pain and shortness of breath and bilateral leg pain.  She states the symptoms just started over the past day or 2.  She has been sick over the past few weeks.  She states that she just wanted to come be checked out.  She states that her breathing is feeling better now.  On my exam, lung sounds are clear.  Chest x-ray is normal.  Patient states that she took a home COVID test that was negative.  She was offered to repeat here and is noted to have mild leukopenia.  She declined.  Patient's vitals have remained stable during evaluation.  She does not appear toxic.  Labs are fairly reassuring.  Will  recommend outpatient follow-up.    Amount and/or Complexity of Data Reviewed Labs: ordered. Radiology: ordered.         Consultants: No consultations were needed in caring for this patient.   Treatment and Plan: I considered admission due to patient's initial presentation, but after considering the examination and diagnostic results, patient will not require admission and can be discharged with outpatient follow-up.    Final Clinical Impressions(s) / ED Diagnoses     ICD-10-CM   1. Chest pain, unspecified type  R07.9       ED Discharge Orders     None         Discharge Instructions Discussed with and Provided to  Patient:     Discharge Instructions      Your tests look good tonight.  Please follow-up with your regular doctor.  If your symptoms change or worsen, please return.       Roxy Horseman, PA-C 09/14/22 0537    Tilden Fossa, MD 09/14/22 [id number]

## 2022-09-13 NOTE — ED Triage Notes (Signed)
Pt complaining of SOB/chest pain, and bilateral leg pain. States she has been sick for the last couple of weeks, but these s/s began today. Pt does endorse some coughing.

## 2022-09-13 NOTE — Patient Outreach (Signed)
Care Coordination   Initial Visit Note   09/13/2022 Name: Erika Murphy MRN: [id number] DOB: 1950/07/18  Erika Murphy is a 72 y.o. year old female who sees Baxley, Luanna Cole, MD for primary care. I spoke with  Erika Murphy by phone today.  What matters to the patients health and wellness today?  Patient would like to get relief from her abdominal pain. She would like to be able to gain weight.     Goals Addressed             This Visit's Progress    COMPLETED: RN Care Coordination Activities: futher follow up needed       Care Coordination Interventions: Assessed for DME needs, determined patient received her walker from [facility] and is utilizing it for ambulation outside of her home Discussed patient is using a cane when walking indoors, she denies having any falls or near falls  Fall education provided to patient  Instructed patient to use her DME at all times to help prevent falls  Instructed patient to notify her doctor of any/all falls when they occur     To have abdominal pain further evaluated and treated       Care Coordination Interventions: Evaluation of current treatment plan related to abdominal pain  and patient's adherence to plan as established by provider Reviewed and discussed with patient her recent ED visit for near syncope and weakness  Determined patient began to experience abdominal pain for approximately 2 weeks, resulting in the inability to stay hydrated and eat complete meals  Reviewed and discussed with patient her in person visit with Dr. Jeani Hawking, GI MD  Review of patient status, including review of consultant's reports, relevant laboratory and other test results, and medications completed Determined patient experienced severe constipation x 1 week with use of Cholestyramine as prescribed by Dr. Gregery Na with Dr. Elnoria Howard regarding patient's reported SE, reply received and patient advised of the following; [name],  Thanks for reaching out.   If she has severe constipation she can hold off until her bowels normalized.  Miralax is fine for now.  If she has diarrhea again, then she can use 1 gram every day.   Dr. Elnoria Howard  Discussed with patient she may benefit from having a nutritionist consultation, collaborated with Dr. Lenord Fellers requesting a referral Determined patient's health plan does not cover this service, she will contact her health plan to inquire about covered benefits for a nutritionist Instructed patient to continue increasing her protein supplement as directed     Interventions Today    Flowsheet Row Most Recent Value  Chronic Disease   Chronic disease during today's visit Other  [near syncope,  failure to thrive,  abdominal pain]  General Interventions   General Interventions Discussed/Reviewed Durable Medical Equipment (DME), General Interventions Discussed, General Interventions Reviewed, Doctor Visits, Communication with  Doctor Visits Discussed/Reviewed Doctor Visits Reviewed, Doctor Visits Discussed, Specialist, PCP  Durable Medical Equipment (DME) Raeanne Gathers crutch]  PCP/Specialist Visits Contact provider for referral to  [facility] provider for referral to PCP  [Dr. Lenord Fellers re: Nutritionist referral]  Communication with PCP/Specialists  [Dr. Neysa Hotter GI to report adverse SE to CHOLESTYRAMINE POWDER]  Exercise Interventions   Exercise Discussed/Reviewed Assistive device use and maintanence, Weight Managment  Weight Management Weight maintenance  Education Interventions   Education Provided Provided Education  Provided Verbal Education On Nutrition, When to see the doctor, Medication  Nutrition Interventions   Nutrition Discussed/Reviewed Nutrition Discussed, Nutrition  Reviewed, Increasing proteins, Supplemental nutrition  Pharmacy Interventions   Pharmacy Dicussed/Reviewed Pharmacy Topics Discussed, Pharmacy Topics Reviewed, Medications and their functions  Safety Interventions   Safety  Discussed/Reviewed Safety Discussed, Safety Reviewed, Fall Risk, Home Safety  Home Safety Assistive Devices          SDOH assessments and interventions completed:  No     Care Coordination Interventions:  Yes, provided   Follow up plan: Follow up call scheduled for 09/26/22 @11 :00 AM    Encounter Outcome:  Patient Visit Completed

## 2022-09-14 LAB — TROPONIN I (HIGH SENSITIVITY): Troponin I (High Sensitivity): 3 ng/L (ref <18)

## 2022-09-14 NOTE — Discharge Instructions (Signed)
Your tests look good tonight.  Please follow-up with your regular doctor.  If your symptoms change or worsen, please return.

## 2022-09-19 ENCOUNTER — Telehealth: Payer: Self-pay | Admitting: Internal Medicine

## 2022-09-19 NOTE — Telephone Encounter (Signed)
Erika Murphy [phone]  [name] called to day about an Emergency Room follow up visit, she was at the Emergency Room on 09/13/2022 with SOB, chest pain and leg pain, then she was there when you sent her on 08/18/2022 for the near syncope. Do we need to follow up with her about these? I scheduled her on 10/03/2022 about area on her right buttock.

## 2022-09-20 DIAGNOSIS — R634 Abnormal weight loss: Secondary | ICD-10-CM | POA: Diagnosis not present

## 2022-09-20 DIAGNOSIS — R1032 Left lower quadrant pain: Secondary | ICD-10-CM | POA: Diagnosis not present

## 2022-09-20 DIAGNOSIS — D131 Benign neoplasm of stomach: Secondary | ICD-10-CM | POA: Diagnosis not present

## 2022-09-20 NOTE — Telephone Encounter (Signed)
scheduled

## 2022-09-21 DIAGNOSIS — Z0289 Encounter for other administrative examinations: Secondary | ICD-10-CM

## 2022-09-22 ENCOUNTER — Encounter: Payer: Self-pay | Admitting: Internal Medicine

## 2022-09-22 ENCOUNTER — Ambulatory Visit: Payer: Medicare HMO | Admitting: Internal Medicine

## 2022-09-22 VITALS — BP 110/70 | HR 84 | Ht 64.0 in | Wt 84.0 lb

## 2022-09-22 DIAGNOSIS — Z85028 Personal history of other malignant neoplasm of stomach: Secondary | ICD-10-CM

## 2022-09-22 DIAGNOSIS — I10 Essential (primary) hypertension: Secondary | ICD-10-CM | POA: Diagnosis not present

## 2022-09-22 DIAGNOSIS — D708 Other neutropenia: Secondary | ICD-10-CM

## 2022-09-22 DIAGNOSIS — G35 Multiple sclerosis: Secondary | ICD-10-CM | POA: Diagnosis not present

## 2022-09-22 DIAGNOSIS — E538 Deficiency of other specified B group vitamins: Secondary | ICD-10-CM | POA: Diagnosis not present

## 2022-09-22 DIAGNOSIS — R5383 Other fatigue: Secondary | ICD-10-CM | POA: Diagnosis not present

## 2022-09-22 DIAGNOSIS — R531 Weakness: Secondary | ICD-10-CM | POA: Diagnosis not present

## 2022-09-22 DIAGNOSIS — E041 Nontoxic single thyroid nodule: Secondary | ICD-10-CM | POA: Diagnosis not present

## 2022-09-22 DIAGNOSIS — N319 Neuromuscular dysfunction of bladder, unspecified: Secondary | ICD-10-CM

## 2022-09-22 DIAGNOSIS — M810 Age-related osteoporosis without current pathological fracture: Secondary | ICD-10-CM

## 2022-09-22 DIAGNOSIS — Z903 Acquired absence of stomach [part of]: Secondary | ICD-10-CM

## 2022-09-22 DIAGNOSIS — Z8639 Personal history of other endocrine, nutritional and metabolic disease: Secondary | ICD-10-CM | POA: Diagnosis not present

## 2022-09-22 DIAGNOSIS — R54 Age-related physical debility: Secondary | ICD-10-CM

## 2022-09-22 DIAGNOSIS — Z9989 Dependence on other enabling machines and devices: Secondary | ICD-10-CM

## 2022-09-22 DIAGNOSIS — D696 Thrombocytopenia, unspecified: Secondary | ICD-10-CM

## 2022-09-22 NOTE — Progress Notes (Signed)
Patient Care Team: Margaree Mackintosh, MD as PCP - General (Internal Medicine) Myna Hidalgo Rose Phi, MD as Consulting Physician (Oncology) Riley Churches, RN as [facility] Care Management  Visit Date: 09/22/22  Subjective:    Patient ID: Erika Murphy , Female   DOB: August 24, 1950, 72 y.o.    MRN: [id number]   72 y.o. Female presents today for a hospital/ED follow-up. She was seen in [facility] ED on 09/14/22 for shortness of breath and fatigue. She was feeling as though she would pass out for two days prior. She had been experiencing cough and fever for the past few weeks. Chest X-ray normal. She had a negative at-home Covid-19 test. She was also seen in ED on 08/18/22 for near syncopal episode and CT chest showed small solid pulmonary nodules measuring up to 3 mm and right thyroid nodule measuring 2.2 cm. She is currently having shortness of breath that is worse when lying flat. She feels mucus in her throat. Denies dysphagia. She has some mild chest pain. Her abdominal pain has improved. She is eating more regularly. Had upper endoscopy on 08/24/22 that showed normal esophagus, gastritis, one gastric polyp, patent Billroth II gastrojejunostomy. Has been placed on Prevalite. She is drinking Boost protein drinks and taking a multivitamin. Iron level in 4/24 normal. She is on B12 1,000 mcg monthly injections.  Past Medical History:  Diagnosis Date   Anxiety    Arthritis    "right shoulder" (10/09/2013)   Asthma    pt denies   B12 deficiency anemia    pt denies   Fibromyalgia    GERD (gastroesophageal reflux disease)    History of blood transfusion 2001   "when I had my stomach cancer"   Hypertension    IBS (irritable bowel syndrome)    Iron deficiency anemia 12/11/2014   Malabsorption of iron 12/11/2014   MS (multiple sclerosis) (HCC) dx'd ~ 1998   Neurogenic bladder    due to MS   Osteopenia    Osteoporosis    PONV (postoperative nausea and vomiting)    Stomach cancer (HCC)  2001   Thyroid nodule    right     Family History  Problem Relation Age of Onset   Heart disease Mother    Diabetes Father    Stomach cancer Brother    Multiple sclerosis Other    Breast cancer Sister        2 sisters breast cancer    Breast cancer Maternal Aunt     Social History   Social History Narrative   Patient is retired.    Patient is married with 3 children.   Patient drinks caffeine daily.       Review of Systems  Constitutional:  Negative for fever and malaise/fatigue.  HENT:  Negative for congestion.   Eyes:  Negative for blurred vision.  Respiratory:  Positive for shortness of breath. Negative for cough.   Cardiovascular:  Positive for chest pain. Negative for palpitations and leg swelling.  Gastrointestinal:  Negative for vomiting.  Musculoskeletal:  Negative for back pain.  Skin:  Negative for rash.       (+) Lesion right buttock  Neurological:  Negative for loss of consciousness and headaches.        Objective:   Vitals: BP 110/70   Pulse 84   Ht 5\' 4"  (1.626 m)   Wt 84 lb (38.1 kg)   SpO2 97%   BMI 14.42 kg/m    Physical  Exam Vitals and nursing note reviewed.  Constitutional:      General: She is not in acute distress.    Appearance: Normal appearance. She is not toxic-appearing.  HENT:     Head: Normocephalic and atraumatic.  Cardiovascular:     Rate and Rhythm: Normal rate and regular rhythm. No extrasystoles are present.    Pulses: Normal pulses.     Heart sounds: Normal heart sounds. No murmur heard.    No friction rub. No gallop.  Pulmonary:     Effort: Pulmonary effort is normal. No respiratory distress.     Breath sounds: Normal breath sounds. No wheezing or rales.  Skin:    General: Skin is warm and dry.     Comments: Polypoid lesion right buttock.  Neurological:     Mental Status: She is alert and oriented to person, place, and time. Mental status is at baseline.  Psychiatric:        Mood and Affect: Mood normal.         Behavior: Behavior normal.        Thought Content: Thought content normal.        Judgment: Judgment normal.       Results:   Studies obtained and personally reviewed by me:  09/14/22 chest X-ray normal.   08/18/22 CT chest showed small solid pulmonary nodules measuring up to 3 mm and right thyroid nodule measuring 2.2 cm.  Upper endoscopy on 08/24/22 showed normal esophagus, gastritis, one gastric polyp, patent Billroth II gastrojejunostomy.   Labs:       Component Value Date/Time   NA 141 09/13/2022 1944   NA 144 06/12/2016 1151   NA 142 06/11/2015 1156   K 4.2 09/13/2022 1944   K 4.7 06/12/2016 1151   K 4.3 06/11/2015 1156   CL 108 09/13/2022 1944   CL 107 06/12/2016 1151   CO2 27 09/13/2022 1944   CO2 32 06/12/2016 1151   CO2 29 06/11/2015 1156   GLUCOSE 86 09/13/2022 1944   GLUCOSE 88 06/12/2016 1151   BUN 12 09/13/2022 1944   BUN 11 06/12/2016 1151   BUN 13.5 06/11/2015 1156   CREATININE 0.93 09/13/2022 1944   CREATININE 0.93 08/04/2022 1118   CREATININE 1.2 (H) 06/11/2015 1156   CALCIUM 8.9 09/13/2022 1944   CALCIUM 9.6 06/12/2016 1151   CALCIUM 9.7 06/11/2015 1156   PROT 8.0 09/13/2022 2300   PROT 7.5 06/12/2016 1151   PROT 7.5 06/11/2015 1156   ALBUMIN 4.2 09/13/2022 2300   ALBUMIN 3.8 06/12/2016 1151   ALBUMIN 3.8 06/11/2015 1156   AST 32 09/13/2022 2300   AST 26 07/07/2022 1218   AST 30 06/11/2015 1156   ALT 29 09/13/2022 2300   ALT 18 07/07/2022 1218   ALT 26 06/12/2016 1151   ALT 23 06/11/2015 1156   ALKPHOS 62 09/13/2022 2300   ALKPHOS 97 (H) 06/12/2016 1151   ALKPHOS 111 06/11/2015 1156   BILITOT 0.4 09/13/2022 2300   BILITOT 0.7 07/07/2022 1218   BILITOT 1.03 06/11/2015 1156   GFRNONAA >60 09/13/2022 1944   GFRNONAA >60 07/07/2022 1218   GFRNONAA 73 01/16/2020 0949   GFRAA 85 01/16/2020 0949     Lab Results  Component Value Date   WBC 2.4 (L) 09/13/2022   HGB 11.6 (L) 09/13/2022   HCT 36.4 09/13/2022   MCV 94.8 09/13/2022   PLT  160 09/13/2022    Lab Results  Component Value Date   CHOL 139 08/04/2022   HDL 51  08/04/2022   LDLCALC 71 08/04/2022   TRIG 88 08/04/2022   CHOLHDL 2.7 08/04/2022    Lab Results  Component Value Date   HGBA1C 5.0 05/13/2021     Lab Results  Component Value Date   TSH 0.63 08/04/2022      Assessment & Plan:   Generalized weakness: etiology unclear though this could be related to her MS. Ordered CBC with Diff/Plat, folate, iron/TIBC/ferritin, magnesium, Vitamin B12. Ordered thyroid ultrasound for follow-up on right thyroid nodule measuring 2.2 cm.  Hx B12 deficiency- B12 level is normal  Hx of iron deficiency- iron level is normal  Fatigue- folate is normal and Mg is normal, Hgb is 11 gram with normal MCV. WBC is always low around 2400-2700. Platelets are low at 117k and are usually normal. MCV is normal.  Vaccine counseling: she will go to the pharmacy for flu, Covid-19 boosters on separate days.  Dr. Elnoria Howard is following her closely and plans follow up endoscopy.  I,Alexander Ruley,acting as a Neurosurgeon for Margaree Mackintosh, MD.,have documented all relevant documentation on the behalf of Margaree Mackintosh, MD,as directed by  Margaree Mackintosh, MD while in the presence of Margaree Mackintosh, MD.   I, Margaree Mackintosh, MD, have reviewed all documentation for this visit. The documentation on 09/30/22 for the exam, diagnosis, procedures, and orders are all accurate and complete.

## 2022-09-23 LAB — VITAMIN B12: Vitamin B-12: 464 pg/mL (ref 200–1100)

## 2022-09-23 LAB — CBC WITH DIFFERENTIAL/PLATELET
Absolute Monocytes: 335 cells/uL (ref 200–950)
Basophils Absolute: 21 cells/uL (ref 0–200)
Basophils Relative: 0.8 %
Eosinophils Absolute: 109 cells/uL (ref 15–500)
Eosinophils Relative: 4.2 %
HCT: 35.2 % (ref 35.0–45.0)
Hemoglobin: 11 g/dL — ABNORMAL LOW (ref 11.7–15.5)
Lymphs Abs: 723 cells/uL — ABNORMAL LOW (ref 850–3900)
MCH: 29.3 pg (ref 27.0–33.0)
MCHC: 31.3 g/dL — ABNORMAL LOW (ref 32.0–36.0)
MCV: 93.9 fL (ref 80.0–100.0)
MPV: 11.4 fL (ref 7.5–12.5)
Monocytes Relative: 12.9 %
Neutro Abs: 1412 cells/uL — ABNORMAL LOW (ref 1500–7800)
Neutrophils Relative %: 54.3 %
Platelets: 117 10*3/uL — ABNORMAL LOW (ref 140–400)
RBC: 3.75 10*6/uL — ABNORMAL LOW (ref 3.80–5.10)
RDW: 11.7 % (ref 11.0–15.0)
Total Lymphocyte: 27.8 %
WBC: 2.6 10*3/uL — ABNORMAL LOW (ref 3.8–10.8)

## 2022-09-23 LAB — IRON,TIBC AND FERRITIN PANEL
%SAT: 32 % (calc) (ref 16–45)
Ferritin: 164 ng/mL (ref 16–288)
Iron: 97 ug/dL (ref 45–160)
TIBC: 303 mcg/dL (calc) (ref 250–450)

## 2022-09-23 LAB — FOLATE: Folate: 6.8 ng/mL

## 2022-09-23 LAB — MAGNESIUM: Magnesium: 1.8 mg/dL (ref 1.5–2.5)

## 2022-09-26 ENCOUNTER — Ambulatory Visit: Payer: Self-pay

## 2022-09-26 NOTE — Patient Outreach (Signed)
Care Coordination   09/26/2022 Name: Erika Murphy MRN: [id number] DOB: November 17, 1950   Care Coordination Outreach Attempts:  An unsuccessful telephone outreach was attempted for a scheduled appointment today.  Follow Up Plan:  Additional outreach attempts will be made to offer the patient care coordination information and services.   Encounter Outcome:  No Answer   Care Coordination Interventions:  No, not indicated    Delsa Sale RN BSN CCM [place]  [facility] Nurse Care Coordinator  Direct Dial: [phone] Website: [email or web address]

## 2022-09-29 ENCOUNTER — Ambulatory Visit
Admission: RE | Admit: 2022-09-29 | Discharge: 2022-09-29 | Disposition: A | Discharge status: Home / Self Care | Payer: Medicare HMO | Source: Ambulatory Visit | Attending: Internal Medicine | Admitting: Internal Medicine

## 2022-09-29 DIAGNOSIS — E041 Nontoxic single thyroid nodule: Secondary | ICD-10-CM | POA: Diagnosis not present

## 2022-09-30 NOTE — Patient Instructions (Signed)
Multiple labs have been checked.  May not be eating as well as she could.  Says she does try to consume supplements such as boost or Ensure.  Continue to monitor closely.  She is frail.  Dr. Elnoria Howard has follow-up planned for GI issues.  Dr. Myna Hidalgo is aware of these issues.

## 2022-10-03 ENCOUNTER — Ambulatory Visit: Payer: Medicare HMO | Admitting: Internal Medicine

## 2022-10-06 ENCOUNTER — Inpatient Hospital Stay: Payer: Medicare HMO | Admitting: Hematology & Oncology

## 2022-10-06 ENCOUNTER — Inpatient Hospital Stay: Payer: Medicare HMO | Attending: Hematology & Oncology

## 2022-10-06 VITALS — BP 149/50 | HR 65 | Temp 97.5°F | Resp 16 | Ht 64.0 in | Wt 84.0 lb

## 2022-10-06 DIAGNOSIS — D500 Iron deficiency anemia secondary to blood loss (chronic): Secondary | ICD-10-CM

## 2022-10-06 DIAGNOSIS — D002 Carcinoma in situ of stomach: Secondary | ICD-10-CM

## 2022-10-06 DIAGNOSIS — E538 Deficiency of other specified B group vitamins: Secondary | ICD-10-CM | POA: Diagnosis not present

## 2022-10-06 DIAGNOSIS — K317 Polyp of stomach and duodenum: Secondary | ICD-10-CM | POA: Insufficient documentation

## 2022-10-06 DIAGNOSIS — G35 Multiple sclerosis: Secondary | ICD-10-CM | POA: Insufficient documentation

## 2022-10-06 DIAGNOSIS — Z85028 Personal history of other malignant neoplasm of stomach: Secondary | ICD-10-CM | POA: Insufficient documentation

## 2022-10-06 DIAGNOSIS — D510 Vitamin B12 deficiency anemia due to intrinsic factor deficiency: Secondary | ICD-10-CM | POA: Diagnosis not present

## 2022-10-06 DIAGNOSIS — D509 Iron deficiency anemia, unspecified: Secondary | ICD-10-CM | POA: Insufficient documentation

## 2022-10-06 LAB — CBC WITH DIFFERENTIAL (CANCER CENTER ONLY)
Abs Immature Granulocytes: 0 10*3/uL (ref 0.00–0.07)
Basophils Absolute: 0 10*3/uL (ref 0.0–0.1)
Basophils Relative: 1 %
Eosinophils Absolute: 0.1 10*3/uL (ref 0.0–0.5)
Eosinophils Relative: 3 %
HCT: 36.4 % (ref 36.0–46.0)
Hemoglobin: 11.7 g/dL — ABNORMAL LOW (ref 12.0–15.0)
Immature Granulocytes: 0 %
Lymphocytes Relative: 35 %
Lymphs Abs: 0.8 10*3/uL (ref 0.7–4.0)
MCH: 30.2 pg (ref 26.0–34.0)
MCHC: 32.1 g/dL (ref 30.0–36.0)
MCV: 93.8 fL (ref 80.0–100.0)
Monocytes Absolute: 0.2 10*3/uL (ref 0.1–1.0)
Monocytes Relative: 8 %
Neutro Abs: 1.2 10*3/uL — ABNORMAL LOW (ref 1.7–7.7)
Neutrophils Relative %: 53 %
Platelet Count: 169 10*3/uL (ref 150–400)
RBC: 3.88 MIL/uL (ref 3.87–5.11)
RDW: 12.6 % (ref 11.5–15.5)
WBC Count: 2.2 10*3/uL — ABNORMAL LOW (ref 4.0–10.5)
nRBC: 0 % (ref 0.0–0.2)

## 2022-10-06 LAB — CMP (CANCER CENTER ONLY)
ALT: 29 U/L (ref 0–44)
AST: 42 U/L — ABNORMAL HIGH (ref 15–41)
Albumin: 4.2 g/dL (ref 3.5–5.0)
Alkaline Phosphatase: 72 U/L (ref 38–126)
Anion gap: 7 (ref 5–15)
BUN: 13 mg/dL (ref 8–23)
CO2: 29 mmol/L (ref 22–32)
Calcium: 9.8 mg/dL (ref 8.9–10.3)
Chloride: 106 mmol/L (ref 98–111)
Creatinine: 0.9 mg/dL (ref 0.44–1.00)
GFR, Estimated: >60 mL/min (ref >60)
Glucose, Bld: 129 mg/dL — ABNORMAL HIGH (ref 70–99)
Potassium: 3.8 mmol/L (ref 3.5–5.1)
Sodium: 142 mmol/L (ref 135–145)
Total Bilirubin: 0.8 mg/dL (ref 0.3–1.2)
Total Protein: 7.8 g/dL (ref 6.5–8.1)

## 2022-10-06 LAB — IRON AND IRON BINDING CAPACITY (CC-WL,HP ONLY)
Iron: 122 ug/dL (ref 28–170)
Saturation Ratios: 34 % — ABNORMAL HIGH (ref 10.4–31.8)
TIBC: 357 ug/dL (ref 250–450)
UIBC: 235 ug/dL (ref 148–442)

## 2022-10-06 LAB — LACTATE DEHYDROGENASE: LDH: 173 U/L (ref 98–192)

## 2022-10-06 LAB — VITAMIN B12: Vitamin B-12: 850 pg/mL (ref 180–914)

## 2022-10-06 LAB — FERRITIN: Ferritin: 179 ng/mL (ref 11–307)

## 2022-10-06 MED ORDER — MEGESTROL ACETATE 625 MG/5ML PO SUSP: 625.0000 mg | Freq: Every day | ORAL | 5 refills | Status: DC

## 2022-10-06 NOTE — Progress Notes (Signed)
Hematology and Oncology Follow Up Visit  LONELL KRAMM [id number] 11-27-1950 72 y.o. 10/06/2022   Principle Diagnosis:  Stage II (T3N0M0) adenocarcinoma of the stomach-remission Recurrent iron deficiency anemia  Pernicious anemia Multiple sclerosis Current Therapy:   IV iron as indicated -- Venofer given on 04/2019 Vitamin B-12 1 mg IM every month --done at home Prolia 60 mg IM q 6 months -- next dose in 11/2022     Interim History:  Ms.  Surratt is back for an early follow-up.  Apparently, she saw her family doctor.  Dr. Lenord Fellers, as well as so thorough, was worried about her blood counts.  She was losing weight.  She had a CT of the chest and abdomen and pelvis.  Thankfully, the CT scan did not show anything that looked suspicious.  She has some's small pulmonary nodules measuring over 3 mm.  Again these do not appear to be malignant.  She was referred to Dr. Elnoria Howard of Gastroenterology.  He did do a upper endoscopy.  He did find that she had a polyp in the stomach.  The polyp was biopsied and found to have some dysplasia.  Dr. Elnoria Howard has her set up with another upper endoscopy and November.  She had a CBC that was done back in August.  This showed a white cell count 2.3.  Hemoglobin 12.6.  Platelet count 147,000.  Her metabolic panel at that time showed a sodium 142.  Potassium 4.1.  BUN 8 creatinine 0.96.  Calcium 9.3 with an albumin of 3.8.  LFTs were essentially normal.  She really needs to have a better appetite.  She said that she just does not want to eat a lot.  I think it be worthwhile trying to get 7 to stimulate her appetite.  I will try her on Megace ES (5 cc p.o. daily) and let see if this may not help.  She has had no bleeding.  She has had no obvious change in bowel or bladder habits.  She has had no fever.  She has had no exacerbations of her MS.  Her last vitamin B12 level was 850.  This was actually done today.  Her iron studies that were done today showed iron saturation of  34%.  Currently, I would have to say that her performance status is probably ECOG 2.   Medications:  Current Outpatient Medications:    albuterol (VENTOLIN HFA) 108 (90 Base) MCG/ACT inhaler, INHALE 2 PUFFS INTO THE LUNGS EVERY 6 HOURS AS NEEDED FOR SHORTNESS OF BREATH/WHEEZING, Disp: 8.5 each, Rfl: 11   ALPRAZolam (XANAX) 0.5 MG tablet, Take one half tab twice daily as needed for anxiety. (Patient taking differently: Take 0.25-0.5 mg by mouth 2 (two) times daily as needed for anxiety. Take one half tab twice daily as needed for anxiety.), Disp: 60 tablet, Rfl: 1   amLODipine (NORVASC) 2.5 MG tablet, TAKE 1 TABLET BY MOUTH EVERY DAY, Disp: 90 tablet, Rfl: 3   cholestyramine light (PREVALITE) 4 g packet, Take 4 g by mouth daily., Disp: , Rfl:    cyanocobalamin (VITAMIN B12) 1000 MCG/ML injection, INJECT 1 ML (1,000 MCG TOTAL) INTO THE MUSCLE ONCE A MONTH AS DIRECTED, Disp: 3 mL, Rfl: 3   denosumab (PROLIA) 60 MG/ML SOSY injection, Inject 60 mg into the skin every 6 (six) months., Disp: , Rfl:    esomeprazole (NEXIUM) 40 MG capsule, TAKE 1 CAPSULE (40 MG TOTAL) BY MOUTH DAILY AT 12 NOON., Disp: 90 capsule, Rfl: 3   meclizine (ANTIVERT) 25  MG tablet, Take 1 tablet (25 mg total) by mouth 3 (three) times daily as needed for dizziness., Disp: 30 tablet, Rfl: 0   meloxicam (MOBIC) 7.5 MG tablet, Take 1 tablet (7.5 mg total) by mouth daily., Disp: 12 tablet, Rfl: 0   methocarbamol (ROBAXIN) 500 MG tablet, Take 1 tablet (500 mg total) by mouth every 8 (eight) hours as needed for muscle spasms., Disp: 30 tablet, Rfl: 1   Multiple Vitamins-Minerals (MULTIVITAMIN GUMMIES WOMENS PO), Take 1 tablet by mouth daily., Disp: , Rfl:    pregabalin (LYRICA) 50 MG capsule, ONE CAPSULE 3 TIMES A DAY BY MOUTH (Patient taking differently: Take 50 mg by mouth 2 (two) times daily. One capsule 3 times a day by mouth), Disp: 90 capsule, Rfl: 5   rosuvastatin (CRESTOR) 5 MG tablet, TAKE 1 TABLET (5 MG TOTAL) BY MOUTH DAILY.,  Disp: 90 tablet, Rfl: 3  Allergies: No Known Allergies  Past Medical History, Surgical history, Social history, and Family History were reviewed and updated.  Review of Systems: Review of Systems  Constitutional:  Positive for malaise/fatigue.  HENT:  Positive for tinnitus.   Eyes:  Positive for blurred vision.  Respiratory: Negative.    Cardiovascular: Negative.   Gastrointestinal:  Positive for diarrhea.  Genitourinary: Negative.   Musculoskeletal:  Positive for myalgias.  Skin: Negative.   Neurological:  Positive for tingling and weakness.  Endo/Heme/Allergies: Negative.   Psychiatric/Behavioral: Negative.      Physical Exam:  height is 5\' 4"  (1.626 m) and weight is 84 lb (38.1 kg). Her oral temperature is 97.5 F (36.4 C) (abnormal). Her blood pressure is 149/50 (abnormal) and her pulse is 65. Her respiration is 16 and oxygen saturation is 100%.   Physical Exam Vitals reviewed.  Constitutional:      Comments: Very thin African-American female.  She does have some temporal muscle wasting.  There is no adenopathy.  There is no rash.  She has had no palpable liver or spleen.  HENT:     Head: Normocephalic and atraumatic.  Eyes:     Pupils: Pupils are equal, round, and reactive to light.  Cardiovascular:     Rate and Rhythm: Normal rate and regular rhythm.     Heart sounds: Normal heart sounds.  Pulmonary:     Effort: Pulmonary effort is normal.     Breath sounds: Normal breath sounds.  Abdominal:     General: Bowel sounds are normal.     Palpations: Abdomen is soft.  Musculoskeletal:        General: No tenderness or deformity. Normal range of motion.     Cervical back: Normal range of motion.     Comments: She has symmetric muscle atrophy in upper and lower extremities.  She has marked limited range of motion of the right shoulder.  Lymphadenopathy:     Cervical: No cervical adenopathy.  Skin:    General: Skin is warm and dry.     Findings: No erythema or rash.   Neurological:     Mental Status: She is alert and oriented to person, place, and time.  Psychiatric:        Behavior: Behavior normal.        Thought Content: Thought content normal.        Judgment: Judgment normal.      Lab Results  Component Value Date   WBC 2.2 (L) 10/06/2022   HGB 11.7 (L) 10/06/2022   HCT 36.4 10/06/2022   MCV 93.8 10/06/2022   PLT  169 10/06/2022     Chemistry      Component Value Date/Time   NA 142 10/06/2022 1010   NA 144 06/12/2016 1151   NA 142 06/11/2015 1156   K 3.8 10/06/2022 1010   K 4.7 06/12/2016 1151   K 4.3 06/11/2015 1156   CL 106 10/06/2022 1010   CL 107 06/12/2016 1151   CO2 29 10/06/2022 1010   CO2 32 06/12/2016 1151   CO2 29 06/11/2015 1156   BUN 13 10/06/2022 1010   BUN 11 06/12/2016 1151   BUN 13.5 06/11/2015 1156   CREATININE 0.90 10/06/2022 1010   CREATININE 0.93 08/04/2022 1118   CREATININE 1.2 (H) 06/11/2015 1156      Component Value Date/Time   CALCIUM 9.8 10/06/2022 1010   CALCIUM 9.6 06/12/2016 1151   CALCIUM 9.7 06/11/2015 1156   ALKPHOS 72 10/06/2022 1010   ALKPHOS 97 (H) 06/12/2016 1151   ALKPHOS 111 06/11/2015 1156   AST 42 (H) 10/06/2022 1010   AST 30 06/11/2015 1156   ALT 29 10/06/2022 1010   ALT 26 06/12/2016 1151   ALT 23 06/11/2015 1156   BILITOT 0.8 10/06/2022 1010   BILITOT 1.03 06/11/2015 1156      Impression and Plan: Ms. Bemus is a 72 year old African [place] female. She had a history of stage II stomach cancer. She underwent resection. She had adjuvant therapy.  She  is now over 23 years out from treatment. She is cured of the stomach cancer.   I suppose that should not be surprised by the polyp in the stomach.  This certainly could be residual from her treatment that she had with radiation therapy.  I totally agree that she needs to have another endoscopy.  She probably needs to have scheduled endoscopies.  Again the weight is certainly troublesome.  I do not think this is anything  reflective of malignancy.  I do not see that she has a bone marrow problem.  Her blood counts are relatively stable.  A look at her blood under the microscope and I certainly do not see anything that looks unusual.  We will see if the Megace ES can help her.  I really think this is a good idea for her.  I would like to see her back in 6 weeks.  Hopefully, her weight will be up to 90 pounds by then.    Josph Macho, MD 10/4/202411:54 AM

## 2022-10-19 ENCOUNTER — Ambulatory Visit: Payer: Self-pay

## 2022-10-19 NOTE — Patient Instructions (Signed)
Visit Information  Thank you for taking time to visit with me today. Please don't hesitate to contact me if I can be of assistance to you.   Following are the goals we discussed today:   Goals Addressed             This Visit's Progress    To have abdominal pain further evaluated and treated   On track    Care Coordination Interventions: Evaluation of current treatment plan related to abdominal pain  and patient's adherence to plan as established by provider Reviewed and discussed with patient her recent follow up with Dr. Elnoria Howard, Gastroenterologist and Dr. Myna Hidalgo her Hematologist/Oncologist Review of patient status, including review of consultant's reports, relevant laboratory and other test results, and medications completed Megace ES (5 cc p.o. daily) Follow up in 6 weeks Complete scheduled endoscopies as recommended by Dr. Elnoria Howard  Determined patient verbalizes understanding of her prescribed treatment plan  Determined patient's daughter is meal prepping for her weekly, allowing her to heat up her meals easily and this providing her with more caloric intake and protein, patient reports having a 2 lb weight gain  Discussed with patient she has been unable to locate a Nutritionist who will provide services due to patient does not have dm or CHF diagnoses  Instructed patient to keep her doctor well informed of new symptoms or concerns         Our next appointment is by telephone on 11/21/22 at 12:30 PM   Please call the care guide team at [phone] if you need to cancel or reschedule your appointment.   If you are experiencing a Mental Health or Behavioral Health Crisis or need someone to talk to, please call 1-800-273-TALK (toll free, 24 hour hotline)  Patient verbalizes understanding of instructions and care plan provided today and agrees to view in MyChart. Active MyChart status and patient understanding of how to access instructions and care plan via MyChart confirmed with  patient.     Delsa Sale RN BSN CCM [place]  [facility], [facility] Nurse Care Coordinator  Direct Dial: [phone] Website: [email or web address]

## 2022-10-19 NOTE — Patient Outreach (Signed)
Care Coordination   Follow Up Visit Note   10/19/2022 Name: Erika Murphy MRN: [id number] DOB: 05-19-1950  Erika Murphy is a 72 y.o. year old female who sees Baxley, Luanna Cole, MD for primary care. I spoke with  Gilmore Laroche by phone today.  What matters to the patients health and wellness today?  Patient will continue to increase her caloric intake in order to gain weight.     Goals Addressed             This Visit's Progress    To have abdominal pain further evaluated and treated   On track    Care Coordination Interventions: Evaluation of current treatment plan related to abdominal pain  and patient's adherence to plan as established by provider Reviewed and discussed with patient her recent follow up with Dr. Elnoria Howard, Gastroenterologist and Dr. Myna Hidalgo her Hematologist/Oncologist Review of patient status, including review of consultant's reports, relevant laboratory and other test results, and medications completed Megace ES (5 cc p.o. daily) Follow up in 6 weeks Complete scheduled endoscopies as recommended by Dr. Elnoria Howard  Determined patient verbalizes understanding of her prescribed treatment plan  Determined patient's daughter is meal prepping for her weekly, allowing her to heat up her meals easily and this providing her with more caloric intake and protein, patient reports having a 2 lb weight gain  Discussed with patient she has been unable to locate a Nutritionist who will provide services due to patient does not have dm or CHF diagnoses  Instructed patient to keep her doctor well informed of new symptoms or concerns     Interventions Today    Flowsheet Row Most Recent Value  Chronic Disease   Chronic disease during today's visit Other  [poor appetite,  iron deficiency anemia,  MS]  General Interventions   General Interventions Discussed/Reviewed General Interventions Discussed, General Interventions Reviewed, Doctor Visits, Labs, Durable Medical Equipment (DME)  Doctor Visits  Discussed/Reviewed Doctor Visits Discussed, Doctor Visits Reviewed, Specialist  Durable Medical Equipment (DME) [name], Other  [crutch]  Education Interventions   Education Provided Provided Education  Provided Verbal Education On Nutrition, When to see the doctor, Medication, Labs  Nutrition Interventions   Nutrition Discussed/Reviewed Nutrition Discussed, Nutrition Reviewed, Increasing proteins  Pharmacy Interventions   Pharmacy Dicussed/Reviewed Pharmacy Topics Reviewed, Pharmacy Topics Discussed, Medications and their functions          SDOH assessments and interventions completed:  No     Care Coordination Interventions:  Yes, provided   Follow up plan: Follow up call scheduled for 11/21/22 @12 :30 PM    Encounter Outcome:  Patient Visit Completed

## 2022-10-30 DIAGNOSIS — H401131 Primary open-angle glaucoma, bilateral, mild stage: Secondary | ICD-10-CM | POA: Diagnosis not present

## 2022-11-06 ENCOUNTER — Other Ambulatory Visit (HOSPITAL_COMMUNITY): Payer: Self-pay

## 2022-11-06 ENCOUNTER — Telehealth: Payer: Self-pay

## 2022-11-06 ENCOUNTER — Encounter: Payer: Self-pay | Admitting: Hematology & Oncology

## 2022-11-06 NOTE — Telephone Encounter (Signed)
Pt ready for scheduling for PROLIA on or after : 11/20/22  Out-of-pocket cost due at time of visit: $346  Primary: [employer] Prolia co-insurance: 20% Admin fee co-insurance: 20%  Secondary: --- Prolia co-insurance:  Admin fee co-insurance:   Medical Benefit Details: Date Benefits were checked: 11/06/22 Deductible: NO/ Coinsurance: 20%/ Admin Fee: 20%  Prior Auth: APPROVED PA# [id number] Expiration Date: 01/03/23-01/02/24  # of doses approved:2  Pharmacy benefit: Copay $0 If patient wants fill through the pharmacy benefit please send prescription to: [employer], and include estimated need by date in rx notes. Pharmacy will ship medication directly to the office.  Patient NOT eligible for Prolia Copay Card. Copay Card can make patient's cost as little as $25. Link to apply: [email or web address]://www.amgensupportplus.[email or web address]/copay  ** This summary of benefits is an estimation of the patient's out-of-pocket cost. Exact cost may very based on individual plan coverage.

## 2022-11-07 DIAGNOSIS — K3189 Other diseases of stomach and duodenum: Secondary | ICD-10-CM | POA: Diagnosis not present

## 2022-11-07 DIAGNOSIS — Z85028 Personal history of other malignant neoplasm of stomach: Secondary | ICD-10-CM | POA: Diagnosis not present

## 2022-11-07 DIAGNOSIS — K219 Gastro-esophageal reflux disease without esophagitis: Secondary | ICD-10-CM | POA: Diagnosis not present

## 2022-11-07 DIAGNOSIS — K317 Polyp of stomach and duodenum: Secondary | ICD-10-CM | POA: Diagnosis not present

## 2022-11-07 DIAGNOSIS — Z934 Other artificial openings of gastrointestinal tract status: Secondary | ICD-10-CM | POA: Diagnosis not present

## 2022-11-07 DIAGNOSIS — K319 Disease of stomach and duodenum, unspecified: Secondary | ICD-10-CM | POA: Diagnosis not present

## 2022-11-07 NOTE — Telephone Encounter (Signed)
Effective 05/18/22-01/02/23 PA #: [id number]

## 2022-11-13 ENCOUNTER — Other Ambulatory Visit: Payer: Self-pay

## 2022-11-17 ENCOUNTER — Encounter: Payer: Self-pay | Admitting: Oncology

## 2022-11-17 ENCOUNTER — Inpatient Hospital Stay: Payer: Medicare HMO | Attending: Hematology & Oncology

## 2022-11-17 ENCOUNTER — Other Ambulatory Visit: Payer: Self-pay

## 2022-11-17 ENCOUNTER — Inpatient Hospital Stay (HOSPITAL_BASED_OUTPATIENT_CLINIC_OR_DEPARTMENT_OTHER): Payer: Medicare HMO | Admitting: Oncology

## 2022-11-17 VITALS — BP 141/55 | HR 68 | Resp 17 | Ht 64.0 in | Wt 83.0 lb

## 2022-11-17 DIAGNOSIS — D509 Iron deficiency anemia, unspecified: Secondary | ICD-10-CM | POA: Insufficient documentation

## 2022-11-17 DIAGNOSIS — D708 Other neutropenia: Secondary | ICD-10-CM

## 2022-11-17 DIAGNOSIS — R634 Abnormal weight loss: Secondary | ICD-10-CM | POA: Insufficient documentation

## 2022-11-17 DIAGNOSIS — Z85028 Personal history of other malignant neoplasm of stomach: Secondary | ICD-10-CM | POA: Diagnosis not present

## 2022-11-17 DIAGNOSIS — D500 Iron deficiency anemia secondary to blood loss (chronic): Secondary | ICD-10-CM

## 2022-11-17 DIAGNOSIS — E538 Deficiency of other specified B group vitamins: Secondary | ICD-10-CM

## 2022-11-17 DIAGNOSIS — Z79899 Other long term (current) drug therapy: Secondary | ICD-10-CM | POA: Diagnosis not present

## 2022-11-17 DIAGNOSIS — D002 Carcinoma in situ of stomach: Secondary | ICD-10-CM

## 2022-11-17 DIAGNOSIS — D510 Vitamin B12 deficiency anemia due to intrinsic factor deficiency: Secondary | ICD-10-CM | POA: Insufficient documentation

## 2022-11-17 DIAGNOSIS — G35 Multiple sclerosis: Secondary | ICD-10-CM | POA: Insufficient documentation

## 2022-11-17 LAB — CBC WITH DIFFERENTIAL (CANCER CENTER ONLY)
Abs Immature Granulocytes: 0.01 10*3/uL (ref 0.00–0.07)
Basophils Absolute: 0 10*3/uL (ref 0.0–0.1)
Basophils Relative: 1 %
Eosinophils Absolute: 0.1 10*3/uL (ref 0.0–0.5)
Eosinophils Relative: 2 %
HCT: 38.2 % (ref 36.0–46.0)
Hemoglobin: 12.1 g/dL (ref 12.0–15.0)
Immature Granulocytes: 0 %
Lymphocytes Relative: 47 %
Lymphs Abs: 1.5 10*3/uL (ref 0.7–4.0)
MCH: 30.2 pg (ref 26.0–34.0)
MCHC: 31.7 g/dL (ref 30.0–36.0)
MCV: 95.3 fL (ref 80.0–100.0)
Monocytes Absolute: 0.4 10*3/uL (ref 0.1–1.0)
Monocytes Relative: 12 %
Neutro Abs: 1.2 10*3/uL — ABNORMAL LOW (ref 1.7–7.7)
Neutrophils Relative %: 38 %
Platelet Count: 157 10*3/uL (ref 150–400)
RBC: 4.01 MIL/uL (ref 3.87–5.11)
RDW: 11.8 % (ref 11.5–15.5)
WBC Count: 3.2 10*3/uL — ABNORMAL LOW (ref 4.0–10.5)
nRBC: 0 % (ref 0.0–0.2)

## 2022-11-17 LAB — CMP (CANCER CENTER ONLY)
ALT: 18 U/L (ref 0–44)
AST: 24 U/L (ref 15–41)
Albumin: 4.6 g/dL (ref 3.5–5.0)
Alkaline Phosphatase: 66 U/L (ref 38–126)
Anion gap: 7 (ref 5–15)
BUN: 19 mg/dL (ref 8–23)
CO2: 32 mmol/L (ref 22–32)
Calcium: 10.5 mg/dL — ABNORMAL HIGH (ref 8.9–10.3)
Chloride: 104 mmol/L (ref 98–111)
Creatinine: 1.2 mg/dL — ABNORMAL HIGH (ref 0.44–1.00)
GFR, Estimated: 48 mL/min — ABNORMAL LOW (ref >60)
Glucose, Bld: 69 mg/dL — ABNORMAL LOW (ref 70–99)
Potassium: 4 mmol/L (ref 3.5–5.1)
Sodium: 143 mmol/L (ref 135–145)
Total Bilirubin: 0.6 mg/dL (ref <1.2)
Total Protein: 7.8 g/dL (ref 6.5–8.1)

## 2022-11-17 LAB — LACTATE DEHYDROGENASE: LDH: 142 U/L (ref 98–192)

## 2022-11-17 NOTE — Progress Notes (Signed)
Hematology and Oncology Follow Up Visit  Erika Murphy [id number] 1950-04-30 72 y.o. 11/17/2022   Principle Diagnosis:  Stage II (T3N0M0) adenocarcinoma of the stomach-remission Recurrent iron deficiency anemia  Pernicious anemia Multiple sclerosis Current Therapy:   IV iron as indicated -- Venofer was last given on 07/21/2022 Vitamin B-12 1 mg IM every month --done at home Prolia 60 mg IM q 6 months -- next dose in 11/2022 at primary care provider's office     Interim History:  [name] returns for scheduled follow-up accompanied by her son.  She reports that she is feeling fairly well.  She has a good appetite but has not gained much weight despite taking Megace.  She was seen by Dr. Elnoria Howard and underwent EGD 11/07/2022.  She reports that she was started on sucralfate but this makes her constipated.  She is not having any fevers, chills, chest pain, shortness of breath, abdominal pain, nausea, vomiting.  He is not having any bleeding such as melena or hematochezia.  She has had no exacerbations of her MS.  Her last vitamin B12 level was 850.  Repeat vitamin B12 level is pending today.  Currently, I would have to say that her performance status is probably ECOG 2.   Medications:  Current Outpatient Medications:    albuterol (VENTOLIN HFA) 108 (90 Base) MCG/ACT inhaler, INHALE 2 PUFFS INTO THE LUNGS EVERY 6 HOURS AS NEEDED FOR SHORTNESS OF BREATH/WHEEZING, Disp: 8.5 each, Rfl: 11   ALPRAZolam (XANAX) 0.5 MG tablet, Take one half tab twice daily as needed for anxiety. (Patient taking differently: Take 0.25-0.5 mg by mouth 2 (two) times daily as needed for anxiety. Take one half tab twice daily as needed for anxiety.), Disp: 60 tablet, Rfl: 1   amLODipine (NORVASC) 2.5 MG tablet, TAKE 1 TABLET BY MOUTH EVERY DAY, Disp: 90 tablet, Rfl: 3   cholestyramine light (PREVALITE) 4 g packet, Take 4 g by mouth daily., Disp: , Rfl:    cyanocobalamin (VITAMIN B12) 1000 MCG/ML injection, INJECT 1 ML  (1,000 MCG TOTAL) INTO THE MUSCLE ONCE A MONTH AS DIRECTED, Disp: 3 mL, Rfl: 3   denosumab (PROLIA) 60 MG/ML SOSY injection, Inject 60 mg into the skin every 6 (six) months., Disp: , Rfl:    esomeprazole (NEXIUM) 40 MG capsule, TAKE 1 CAPSULE (40 MG TOTAL) BY MOUTH DAILY AT 12 NOON., Disp: 90 capsule, Rfl: 3   meclizine (ANTIVERT) 25 MG tablet, Take 1 tablet (25 mg total) by mouth 3 (three) times daily as needed for dizziness., Disp: 30 tablet, Rfl: 0   megestrol (MEGACE ES) 625 MG/5ML suspension, Take 5 mLs (625 mg total) by mouth daily., Disp: 150 mL, Rfl: 5   meloxicam (MOBIC) 7.5 MG tablet, Take 1 tablet (7.5 mg total) by mouth daily., Disp: 12 tablet, Rfl: 0   methocarbamol (ROBAXIN) 500 MG tablet, Take 1 tablet (500 mg total) by mouth every 8 (eight) hours as needed for muscle spasms., Disp: 30 tablet, Rfl: 1   Multiple Vitamins-Minerals (MULTIVITAMIN GUMMIES WOMENS PO), Take 1 tablet by mouth daily., Disp: , Rfl:    pregabalin (LYRICA) 50 MG capsule, ONE CAPSULE 3 TIMES A DAY BY MOUTH (Patient taking differently: Take 50 mg by mouth 2 (two) times daily. One capsule 3 times a day by mouth), Disp: 90 capsule, Rfl: 5   rosuvastatin (CRESTOR) 5 MG tablet, TAKE 1 TABLET (5 MG TOTAL) BY MOUTH DAILY., Disp: 90 tablet, Rfl: 3  Allergies: No Known Allergies  Past Medical History, Surgical  history, Social history, and Family History were reviewed and updated.  Review of Systems: Review of Systems  Constitutional:  Positive for malaise/fatigue.  HENT:  Positive for tinnitus.   Eyes:  Positive for blurred vision.  Respiratory: Negative.    Cardiovascular: Negative.   Gastrointestinal:  Positive for constipation. Negative for diarrhea.  Genitourinary: Negative.   Musculoskeletal:  Positive for myalgias.  Skin: Negative.   Neurological:  Positive for tingling and weakness.  Endo/Heme/Allergies: Negative.   Psychiatric/Behavioral: Negative.      Physical Exam:  height is 5\' 4"  (1.626 m) and  weight is 37.6 kg. Her blood pressure is 141/55 (abnormal) and her pulse is 68. Her respiration is 17 and oxygen saturation is 100%.   Physical Exam Vitals reviewed.  Constitutional:      Comments: Very thin African-American female.  She does have some temporal muscle wasting.  There is no adenopathy.  There is no rash.  She has had no palpable liver or spleen.  HENT:     Head: Normocephalic and atraumatic.  Eyes:     Pupils: Pupils are equal, round, and reactive to light.  Cardiovascular:     Rate and Rhythm: Normal rate and regular rhythm.     Heart sounds: Normal heart sounds.  Pulmonary:     Effort: Pulmonary effort is normal.     Breath sounds: Normal breath sounds.  Abdominal:     General: Bowel sounds are normal.     Palpations: Abdomen is soft.  Musculoskeletal:        General: No tenderness or deformity. Normal range of motion.     Cervical back: Normal range of motion.     Comments: She has symmetric muscle atrophy in upper and lower extremities.  She has marked limited range of motion of the right shoulder.  Lymphadenopathy:     Cervical: No cervical adenopathy.  Skin:    General: Skin is warm and dry.     Findings: No erythema or rash.  Neurological:     Mental Status: She is alert and oriented to person, place, and time.  Psychiatric:        Behavior: Behavior normal.        Thought Content: Thought content normal.        Judgment: Judgment normal.      Lab Results  Component Value Date   WBC 3.2 (L) 11/17/2022   HGB 12.1 11/17/2022   HCT 38.2 11/17/2022   MCV 95.3 11/17/2022   PLT 157 11/17/2022     Chemistry      Component Value Date/Time   NA 143 11/17/2022 1516   NA 144 06/12/2016 1151   NA 142 06/11/2015 1156   K 4.0 11/17/2022 1516   K 4.7 06/12/2016 1151   K 4.3 06/11/2015 1156   CL 104 11/17/2022 1516   CL 107 06/12/2016 1151   CO2 32 11/17/2022 1516   CO2 32 06/12/2016 1151   CO2 29 06/11/2015 1156   BUN 19 11/17/2022 1516   BUN 11  06/12/2016 1151   BUN 13.5 06/11/2015 1156   CREATININE 1.20 (H) 11/17/2022 1516   CREATININE 0.93 08/04/2022 1118   CREATININE 1.2 (H) 06/11/2015 1156      Component Value Date/Time   CALCIUM 10.5 (H) 11/17/2022 1516   CALCIUM 9.6 06/12/2016 1151   CALCIUM 9.7 06/11/2015 1156   ALKPHOS 66 11/17/2022 1516   ALKPHOS 97 (H) 06/12/2016 1151   ALKPHOS 111 06/11/2015 1156   AST 24 11/17/2022 1516  AST 30 06/11/2015 1156   ALT 18 11/17/2022 1516   ALT 26 06/12/2016 1151   ALT 23 06/11/2015 1156   BILITOT 0.6 11/17/2022 1516   BILITOT 1.03 06/11/2015 1156      Impression and Plan: Erika Murphy is a 72 year old African [place] female. She had a history of stage II stomach cancer. She underwent resection. She had adjuvant therapy.  She  is now over 23 years out from treatment. She is cured of the stomach cancer.   She had a recent EGD which did not show any evidence of recurrent cancer.  He has been started on sucralfate by GI which is causing constipation so she has not been taking this routinely.  I advised her to contact Dr. Elnoria Howard to discuss this further.  She has had recent weight loss but reports a good appetite.  Blood sugar today was only 69 but she is asymptomatic.  She has eaten several times today.  We discussed eating small frequent meals throughout the day.  Continue Megace.  We will continue to monitor her weight.  Her CBC from today has been reviewed.  Hemoglobin is now normal at 12.1.  Her white blood cell count is mildly decreased but improved compared to previous reports.  Her white blood cell count today is 3.2.  Count is normal at 157,000.  Today, her vitamin B12, ferritin, iron studies are pending.  We will follow-up on these.  Will plan to bring her back in approximately 2 months for close monitoring.  Clenton Pare, NP 11/15/20244:29 PM

## 2022-11-18 LAB — VITAMIN B12: Vitamin B-12: 496 pg/mL (ref 180–914)

## 2022-11-18 LAB — FERRITIN: Ferritin: 106 ng/mL (ref 11–307)

## 2022-11-18 LAB — PREALBUMIN: Prealbumin: 23 mg/dL (ref 18–38)

## 2022-11-20 ENCOUNTER — Encounter: Payer: Self-pay | Admitting: *Deleted

## 2022-11-20 LAB — IRON AND IRON BINDING CAPACITY (CC-WL,HP ONLY)
Iron: 117 ug/dL (ref 28–170)
Saturation Ratios: 29 % (ref 10.4–31.8)
TIBC: 405 ug/dL (ref 250–450)
UIBC: 288 ug/dL (ref 148–442)

## 2022-11-21 ENCOUNTER — Ambulatory Visit: Payer: Self-pay

## 2022-11-21 NOTE — Patient Instructions (Signed)
Visit Information  Thank you for taking time to visit with me today. Please don't hesitate to contact me if I can be of assistance to you.   Following are the goals we discussed today:   Goals Addressed             This Visit's Progress    To have abdominal pain further evaluated and treated   On track    Care Coordination Interventions: Evaluation of current treatment plan related to abdominal pain  and patient's adherence to plan as established by provider Discussed with patient she underwent an EGD, having 1 benign polyp removed  Discussed patient's abdominal pain has subsided since having this polyp removed Reviewed medications with patient and discussed importance of medication adherence Determined patient's appetite has improved, she is sleeping well and maintaining her weight, states she feels her health has been restored back to what feels like her normal self Reviewed and discussed with patient her next scheduled upcoming visit with Dr. Myna Hidalgo for oncology follow up set for 01/19/23 @12 :15 labs/12:30 w/Dr. Myna Hidalgo Patient will continue to monitor her weight and report weight loss or change in appetite to her doctor Patient will continue to take her medications as prescribed Patient will continue to work with RN Care Coordination nurse for disease management and care coordination needs         Our next appointment is by telephone on 01/23/23 at 09:30 AM  Please call the care guide team at [phone] if you need to cancel or reschedule your appointment.   If you are experiencing a Mental Health or Behavioral Health Crisis or need someone to talk to, please call 1-800-273-TALK (toll free, 24 hour hotline)  Patient verbalizes understanding of instructions and care plan provided today and agrees to view in MyChart. Active MyChart status and patient understanding of how to access instructions and care plan via MyChart confirmed with patient.     Delsa Sale RN BSN CCM [facility]  [facility], [facility] Nurse Care Coordinator  Direct Dial: [phone] Website: [email or web address]

## 2022-11-21 NOTE — Patient Outreach (Signed)
  Care Coordination   Follow Up Visit Note   11/21/2022 Name: Erika Murphy MRN: [id number] DOB: [birth year]  Erika Murphy is a 72 y.o. year old female who sees Baxley, Luanna Cole, MD for primary care. I spoke with  Gilmore Laroche by phone today.  What matters to the patients health and wellness today?  Patient would like to continue feeling healthy and staying cancer free.     Goals Addressed             This Visit's Progress    To have abdominal pain further evaluated and treated   On track    Care Coordination Interventions: Evaluation of current treatment plan related to abdominal pain  and patient's adherence to plan as established by provider Discussed with patient she underwent an EGD, having 1 benign polyp removed  Discussed patient's abdominal pain has subsided since having this polyp removed Reviewed medications with patient and discussed importance of medication adherence Determined patient's appetite has improved, she is sleeping well and maintaining her weight, states she feels her health has been restored back to what feels like her normal self Reviewed and discussed with patient her next scheduled upcoming visit with Dr. Myna Hidalgo for oncology follow up set for 01/19/23 @12 :15 labs/12:30 w/Dr. Myna Hidalgo Patient will continue to monitor her weight and report weight loss or change in appetite to her doctor Patient will continue to take her medications as prescribed Patient will continue to work with RN Care Coordination nurse for disease management and care coordination needs     Interventions Today    Flowsheet Row Most Recent Value  Chronic Disease   Chronic disease during today's visit Other  [abdominal pain,  MS,  s/p stomach Cancer in remission]  General Interventions   General Interventions Discussed/Reviewed General Interventions Discussed, General Interventions Reviewed, Doctor Visits  Doctor Visits Discussed/Reviewed Doctor Visits Reviewed, Doctor Visits Discussed,  Specialist, PCP  Education Interventions   Education Provided Provided Education  Provided Verbal Education On When to see the doctor, Nutrition, Medication  Nutrition Interventions   Nutrition Discussed/Reviewed Nutrition Discussed, Nutrition Reviewed, Increasing proteins  Pharmacy Interventions   Pharmacy Dicussed/Reviewed Pharmacy Topics Discussed, Pharmacy Topics Reviewed, Medications and their functions          SDOH assessments and interventions completed:  No     Care Coordination Interventions:  Yes, provided   Follow up plan: Follow up call scheduled for 01/23/23 @09 :30 AM    Encounter Outcome:  Patient Visit Completed

## 2022-11-24 ENCOUNTER — Ambulatory Visit (INDEPENDENT_AMBULATORY_CARE_PROVIDER_SITE_OTHER): Payer: Medicare HMO

## 2022-11-24 VITALS — BP 100/70 | HR 80 | Ht 64.0 in | Wt 83.0 lb

## 2022-11-24 DIAGNOSIS — M810 Age-related osteoporosis without current pathological fracture: Secondary | ICD-10-CM | POA: Diagnosis not present

## 2022-11-24 MED ORDER — DENOSUMAB 60 MG/ML ~~LOC~~ SOSY
60.0000 mg | PREFILLED_SYRINGE | Freq: Once | SUBCUTANEOUS | Status: AC
  Administered 2022-11-24: 60 mg via SUBCUTANEOUS

## 2022-11-24 NOTE — Progress Notes (Signed)
Patient presents to clinic for a prolia injrction SQ. Patient tolerated well.

## 2022-11-24 NOTE — Patient Instructions (Signed)
Return in 6 months for next prolia injection.

## 2022-12-19 ENCOUNTER — Ambulatory Visit: Payer: Self-pay | Admitting: Internal Medicine

## 2022-12-19 NOTE — Telephone Encounter (Signed)
Copied from CRM [id number]. Topic: Clinical - Medication Question >> Dec 19, 2022 12:19 PM Nurse Corrie Dandy A wrote: Reason for CRM: Patient called to ask if she could take a Men's Multivitamin  Chief Complaint: Medication Question Disposition: [] ED /[] Urgent Care (no appt availability in office) / [] Appointment(In office/virtual)/ []  [facility]/ [] Home Care/ [] Refused Recommended Disposition /[] [facility]/ [x]  Follow-up with PCP Additional Notes: Patient called to ask if she could take a Men's One a Day Multivitamin.  This was accidentally purchased instead of the Women's One a Day Multivitamin and the store would not accept a return on it.  Patient stated since she had M.S. and was in remission for cancer, she wanted to check and make sure if it was okay to take and if she needed to take a different recommended amount? (She said she was told Mens vitamins had more Zinc in them than Womens)  I advised patient that someone would give her a call back with an answer after running this by her provider.  Reason for Disposition  [1] Caller requesting NON-URGENT health information AND [2] PCP's office is the best resource  Answer Assessment - Initial Assessment Questions 1. REASON FOR CALL or QUESTION: "What is your reason for calling today?" or "How can I best help you?" or "What question do you have that I can help answer?"     Patient called to ask if she could take a Men's One A Day Multivitamin.  She said it was accidentally purchased instead of the Women's and the store would not take it back so she just wanted to know with her medical history, if it was okay to take and if she needed to take a different recommended amount. (She said she was told Mens vitamins had more Zinc in them)  Protocols used: Information Only Call - No Triage-A-AH

## 2022-12-21 NOTE — Telephone Encounter (Signed)
Patient notified

## 2022-12-28 ENCOUNTER — Other Ambulatory Visit: Payer: Self-pay | Admitting: Internal Medicine

## 2022-12-29 MED ORDER — PREGABALIN 50 MG PO CAPS: ORAL_CAPSULE | ORAL | 1 refills | Status: DC

## 2022-12-29 NOTE — Addendum Note (Signed)
Addended by: Gregery Na on: 12/29/2022 10:22 AM   Modules accepted: Orders

## 2022-12-30 ENCOUNTER — Other Ambulatory Visit: Payer: Self-pay | Admitting: Internal Medicine

## 2023-01-18 ENCOUNTER — Other Ambulatory Visit: Payer: Self-pay | Admitting: *Deleted

## 2023-01-18 DIAGNOSIS — D500 Iron deficiency anemia secondary to blood loss (chronic): Secondary | ICD-10-CM

## 2023-01-18 DIAGNOSIS — K909 Intestinal malabsorption, unspecified: Secondary | ICD-10-CM

## 2023-01-18 DIAGNOSIS — E538 Deficiency of other specified B group vitamins: Secondary | ICD-10-CM

## 2023-01-19 ENCOUNTER — Encounter: Payer: Self-pay | Admitting: Hematology & Oncology

## 2023-01-19 ENCOUNTER — Inpatient Hospital Stay (HOSPITAL_BASED_OUTPATIENT_CLINIC_OR_DEPARTMENT_OTHER): Payer: Medicare HMO | Admitting: Hematology & Oncology

## 2023-01-19 ENCOUNTER — Other Ambulatory Visit: Payer: Self-pay

## 2023-01-19 ENCOUNTER — Inpatient Hospital Stay: Payer: Medicare HMO | Attending: Hematology & Oncology

## 2023-01-19 VITALS — BP 135/61 | HR 72 | Temp 98.7°F | Resp 18 | Ht 64.0 in | Wt 84.0 lb

## 2023-01-19 DIAGNOSIS — G35 Multiple sclerosis: Secondary | ICD-10-CM

## 2023-01-19 DIAGNOSIS — D510 Vitamin B12 deficiency anemia due to intrinsic factor deficiency: Secondary | ICD-10-CM | POA: Diagnosis not present

## 2023-01-19 DIAGNOSIS — D500 Iron deficiency anemia secondary to blood loss (chronic): Secondary | ICD-10-CM | POA: Diagnosis not present

## 2023-01-19 DIAGNOSIS — E538 Deficiency of other specified B group vitamins: Secondary | ICD-10-CM | POA: Diagnosis not present

## 2023-01-19 DIAGNOSIS — D509 Iron deficiency anemia, unspecified: Secondary | ICD-10-CM | POA: Diagnosis not present

## 2023-01-19 DIAGNOSIS — K909 Intestinal malabsorption, unspecified: Secondary | ICD-10-CM

## 2023-01-19 DIAGNOSIS — D002 Carcinoma in situ of stomach: Secondary | ICD-10-CM

## 2023-01-19 DIAGNOSIS — Z85820 Personal history of malignant melanoma of skin: Secondary | ICD-10-CM | POA: Insufficient documentation

## 2023-01-19 DIAGNOSIS — Z85028 Personal history of other malignant neoplasm of stomach: Secondary | ICD-10-CM | POA: Diagnosis present

## 2023-01-19 LAB — CMP (CANCER CENTER ONLY)
ALT: 24 U/L (ref 0–44)
AST: 32 U/L (ref 15–41)
Albumin: 4.5 g/dL (ref 3.5–5.0)
Alkaline Phosphatase: 72 U/L (ref 38–126)
Anion gap: 6 (ref 5–15)
BUN: 12 mg/dL (ref 8–23)
CO2: 30 mmol/L (ref 22–32)
Calcium: 9.1 mg/dL (ref 8.9–10.3)
Chloride: 106 mmol/L (ref 98–111)
Creatinine: 0.86 mg/dL (ref 0.44–1.00)
GFR, Estimated: >60 mL/min (ref >60)
Glucose, Bld: 82 mg/dL (ref 70–99)
Potassium: 4.2 mmol/L (ref 3.5–5.1)
Sodium: 142 mmol/L (ref 135–145)
Total Bilirubin: 0.8 mg/dL (ref 0.0–1.2)
Total Protein: 7.7 g/dL (ref 6.5–8.1)

## 2023-01-19 LAB — CBC WITH DIFFERENTIAL (CANCER CENTER ONLY)
Abs Immature Granulocytes: 0.01 10*3/uL (ref 0.00–0.07)
Basophils Absolute: 0 10*3/uL (ref 0.0–0.1)
Basophils Relative: 1 %
Eosinophils Absolute: 0 10*3/uL (ref 0.0–0.5)
Eosinophils Relative: 1 %
HCT: 37.5 % (ref 36.0–46.0)
Hemoglobin: 12.2 g/dL (ref 12.0–15.0)
Immature Granulocytes: 0 %
Lymphocytes Relative: 37 %
Lymphs Abs: 1 10*3/uL (ref 0.7–4.0)
MCH: 30.4 pg (ref 26.0–34.0)
MCHC: 32.5 g/dL (ref 30.0–36.0)
MCV: 93.5 fL (ref 80.0–100.0)
Monocytes Absolute: 0.3 10*3/uL (ref 0.1–1.0)
Monocytes Relative: 11 %
Neutro Abs: 1.4 10*3/uL — ABNORMAL LOW (ref 1.7–7.7)
Neutrophils Relative %: 50 %
Platelet Count: 160 10*3/uL (ref 150–400)
RBC: 4.01 MIL/uL (ref 3.87–5.11)
RDW: 11.9 % (ref 11.5–15.5)
WBC Count: 2.8 10*3/uL — ABNORMAL LOW (ref 4.0–10.5)
nRBC: 0 % (ref 0.0–0.2)

## 2023-01-19 LAB — LACTATE DEHYDROGENASE: LDH: 164 U/L (ref 98–192)

## 2023-01-19 LAB — FERRITIN: Ferritin: 106 ng/mL (ref 11–307)

## 2023-01-19 LAB — IRON AND IRON BINDING CAPACITY (CC-WL,HP ONLY)
Iron: 113 ug/dL (ref 28–170)
Saturation Ratios: 29 % (ref 10.4–31.8)
TIBC: 385 ug/dL (ref 250–450)
UIBC: 272 ug/dL (ref 148–442)

## 2023-01-19 LAB — VITAMIN B12: Vitamin B-12: 396 pg/mL (ref 180–914)

## 2023-01-19 NOTE — Progress Notes (Signed)
Hematology and Oncology Follow Up Visit  Erika Murphy [id number] 1950-01-18 73 y.o. 01/19/2023   Principle Diagnosis:  Stage II (T3N0M0) adenocarcinoma of the stomach-remission Recurrent iron deficiency anemia  Pernicious anemia Multiple sclerosis Current Therapy:   IV iron as indicated -- Venofer was last given on 07/21/2022 Vitamin B-12 1 mg IM every month --done at home Prolia 60 mg IM q 6 months -- next dose in 05/2023 at primary care provider's office     Interim History:  [name] returns for follow-up.  She still is not able to gain weight.  I do believe that a lot of this is from her having passed stomach surgery.  I still think she has a lot of surface area in the stomach to help absorb.  I am not sure what else can be done for this.  I know we gave her some Megace elixir.  I really do not think she is taking this all that much.  She has had no problems with nausea or vomiting.  She has had a couple episodes of chest discomfort.  I do not know if this might be reflux.  Thankfully, there is been no exacerbations of her multiple sclerosis.  She has had no rashes.  She has had no problems with COVID.    Her last vitamin B12 level was 496.  Repeat vitamin B12 level is pending today.  Her last iron studies that were done back in November showed a ferritin of 106 with an iron saturation of 29%.  Currently, I would have to say that her performance status is probably ECOG 2.   Medications:  Current Outpatient Medications:    nystatin (MYCOSTATIN) 500000 units TABS tablet, Take 1 tablet by mouth 4 (four) times daily., Disp: , Rfl:    albuterol (VENTOLIN HFA) 108 (90 Base) MCG/ACT inhaler, INHALE 2 PUFFS INTO THE LUNGS EVERY 6 HOURS AS NEEDED FOR SHORTNESS OF BREATH/WHEEZING, Disp: 8.5 each, Rfl: 11   ALPRAZolam (XANAX) 0.5 MG tablet, Take one half tab twice daily as needed for anxiety. (Patient taking differently: Take 0.25-0.5 mg by mouth 2 (two) times daily as needed for  anxiety. Take one half tab twice daily as needed for anxiety.), Disp: 60 tablet, Rfl: 1   amLODipine (NORVASC) 2.5 MG tablet, TAKE 1 TABLET BY MOUTH EVERY DAY, Disp: 90 tablet, Rfl: 3   cholestyramine light (PREVALITE) 4 g packet, Take 4 g by mouth daily., Disp: , Rfl:    cyanocobalamin (VITAMIN B12) 1000 MCG/ML injection, INJECT 1 ML (1,000 MCG TOTAL) INTO THE MUSCLE ONCE A MONTH AS DIRECTED, Disp: 3 mL, Rfl: 3   denosumab (PROLIA) 60 MG/ML SOSY injection, Inject 60 mg into the skin every 6 (six) months., Disp: , Rfl:    esomeprazole (NEXIUM) 40 MG capsule, TAKE 1 CAPSULE BY MOUTH DAILY AT 12 NOON, Disp: 90 capsule, Rfl: 3   meclizine (ANTIVERT) 25 MG tablet, Take 1 tablet (25 mg total) by mouth 3 (three) times daily as needed for dizziness., Disp: 30 tablet, Rfl: 0   megestrol (MEGACE ES) 625 MG/5ML suspension, Take 5 mLs (625 mg total) by mouth daily., Disp: 150 mL, Rfl: 5   meloxicam (MOBIC) 7.5 MG tablet, Take 1 tablet (7.5 mg total) by mouth daily., Disp: 12 tablet, Rfl: 0   methocarbamol (ROBAXIN) 500 MG tablet, Take 1 tablet (500 mg total) by mouth every 8 (eight) hours as needed for muscle spasms., Disp: 30 tablet, Rfl: 1   Multiple Vitamins-Minerals (MULTIVITAMIN GUMMIES WOMENS PO),  Take 1 tablet by mouth daily., Disp: , Rfl:    pregabalin (LYRICA) 50 MG capsule, TAKE 1 CAPSULE BY MOUTH THREE TIMES A DAY, Disp: 90 capsule, Rfl: 1   rosuvastatin (CRESTOR) 5 MG tablet, TAKE 1 TABLET (5 MG TOTAL) BY MOUTH DAILY., Disp: 90 tablet, Rfl: 3  Allergies: No Known Allergies  Past Medical History, Surgical history, Social history, and Family History were reviewed and updated.  Review of Systems: Review of Systems  Constitutional:  Positive for malaise/fatigue.  HENT:  Positive for tinnitus.   Eyes:  Positive for blurred vision.  Respiratory: Negative.    Cardiovascular: Negative.   Gastrointestinal:  Positive for constipation. Negative for diarrhea.  Genitourinary: Negative.    Musculoskeletal:  Positive for myalgias.  Skin: Negative.   Neurological:  Positive for tingling and weakness.  Endo/Heme/Allergies: Negative.   Psychiatric/Behavioral: Negative.      Physical Exam:  height is 5\' 4"  (1.626 m) and weight is 84 lb (38.1 kg). Her oral temperature is 98.7 F (37.1 C). Her blood pressure is 135/61 and her pulse is 72. Her respiration is 18 and oxygen saturation is 100%.   Physical Exam Vitals reviewed.  Constitutional:      Comments: Very thin African-American female.  She does have some temporal muscle wasting.  There is no adenopathy.  There is no rash.  She has had no palpable liver or spleen.  HENT:     Head: Normocephalic and atraumatic.  Eyes:     Pupils: Pupils are equal, round, and reactive to light.  Cardiovascular:     Rate and Rhythm: Normal rate and regular rhythm.     Heart sounds: Normal heart sounds.  Pulmonary:     Effort: Pulmonary effort is normal.     Breath sounds: Normal breath sounds.  Abdominal:     General: Bowel sounds are normal.     Palpations: Abdomen is soft.  Musculoskeletal:        General: No tenderness or deformity. Normal range of motion.     Cervical back: Normal range of motion.     Comments: She has symmetric muscle atrophy in upper and lower extremities.  She has marked limited range of motion of the right shoulder.  Lymphadenopathy:     Cervical: No cervical adenopathy.  Skin:    General: Skin is warm and dry.     Findings: No erythema or rash.  Neurological:     Mental Status: She is alert and oriented to person, place, and time.  Psychiatric:        Behavior: Behavior normal.        Thought Content: Thought content normal.        Judgment: Judgment normal.      Lab Results  Component Value Date   WBC 2.8 (L) 01/19/2023   HGB 12.2 01/19/2023   HCT 37.5 01/19/2023   MCV 93.5 01/19/2023   PLT 160 01/19/2023     Chemistry      Component Value Date/Time   NA 142 01/19/2023 1219   NA 144  06/12/2016 1151   NA 142 06/11/2015 1156   K 4.2 01/19/2023 1219   K 4.7 06/12/2016 1151   K 4.3 06/11/2015 1156   CL 106 01/19/2023 1219   CL 107 06/12/2016 1151   CO2 30 01/19/2023 1219   CO2 32 06/12/2016 1151   CO2 29 06/11/2015 1156   BUN 12 01/19/2023 1219   BUN 11 06/12/2016 1151   BUN 13.5 06/11/2015 1156  CREATININE 0.86 01/19/2023 1219   CREATININE 0.93 08/04/2022 1118   CREATININE 1.2 (H) 06/11/2015 1156      Component Value Date/Time   CALCIUM 9.1 01/19/2023 1219   CALCIUM 9.6 06/12/2016 1151   CALCIUM 9.7 06/11/2015 1156   ALKPHOS 72 01/19/2023 1219   ALKPHOS 97 (H) 06/12/2016 1151   ALKPHOS 111 06/11/2015 1156   AST 32 01/19/2023 1219   AST 30 06/11/2015 1156   ALT 24 01/19/2023 1219   ALT 26 06/12/2016 1151   ALT 23 06/11/2015 1156   BILITOT 0.8 01/19/2023 1219   BILITOT 1.03 06/11/2015 1156      Impression and Plan: Erika Murphy is a 73 year old African [place] female. She had a history of stage II stomach cancer. She underwent resection. She had adjuvant therapy.  She  is now over 23 years out from treatment. She is cured of the stomach cancer.   The big event this year will be her 73th birthday.  She has a twin sister that is going to come down for this.  I am so excited for her.  Again, I just feel bad that she is not able to gain weight.  I do not think she is losing them much weight.  Alona Bene has had the leukopenia.  I am really not surprised by this.  I would like to get her back in the Spring.  Hopefully, we will get her through the Winter and any potential bad weather.   Josph Macho, MD 1/17/20251:34 PM

## 2023-01-23 ENCOUNTER — Ambulatory Visit: Payer: Self-pay

## 2023-01-23 NOTE — Patient Outreach (Signed)
  Care Coordination   Follow Up Visit Note   01/23/2023 Name: Erika Murphy MRN: [id number] DOB: Oct 13, 1950  Erika Murphy is a 73 y.o. year old female who sees Baxley, Luanna Cole, MD for primary care. I spoke with  Gilmore Laroche by phone today.  What matters to the patients health and wellness today?  Patient would like to maintain her current weight and preserve her muscle mass in order to stay physically active.     Goals Addressed             This Visit's Progress    To have abdominal pain further evaluated and treated   On track    Care Coordination Interventions: Evaluation of current treatment plan related to abdominal pain  and patient's adherence to plan as established by provider Reviewed and discussed with patient her recent follow up with Dr. Twanna Hy for evaluation of stomach cancer Review of patient status, including review of consultant's reports, relevant laboratory and other test results, and medications completed Assessed for nutritional status, discussed patient's daughter continues to help with meal prep and meal planning, patient is taking in adequate protein and maintaining her weight Educated patient about the importance of exercise, including light weight lifting to help build muscle mass, strength and endurance  Reviewed and discussed with patient her next upcoming scheduled follow up with Dr. Twanna Hy scheduled for 03/23/23 @1 :30 PM for labs 03/23/23 @1 :45 PM  Instructed patient to contact her doctor to report new symptoms or concerns promptly     Interventions Today    Flowsheet Row Most Recent Value  Chronic Disease   Chronic disease during today's visit Other  [carcinoma of stomach in remission]  General Interventions   General Interventions Discussed/Reviewed General Interventions Discussed, General Interventions Reviewed, Doctor Visits  Doctor Visits Discussed/Reviewed Doctor Visits Discussed, Doctor Visits Reviewed, PCP, Specialist  Exercise Interventions    Exercise Discussed/Reviewed Physical Activity, Exercise Reviewed, Exercise Discussed, Weight Managment  Physical Activity Discussed/Reviewed Physical Activity Discussed, Physical Activity Reviewed, Types of exercise  Weight Management Weight maintenance  Education Interventions   Education Provided Provided Education  Provided Verbal Education On Nutrition, When to see the doctor, Medication, Labs, Exercise  Labs Reviewed --  [vitamin B12 and the iron levels]  Nutrition Interventions   Nutrition Discussed/Reviewed Nutrition Discussed, Nutrition Reviewed, Increasing proteins          SDOH assessments and interventions completed:  No     Care Coordination Interventions:  Yes, provided   Follow up plan: Follow up call scheduled for 03/27/23 @10 :30 AM    Encounter Outcome:  Patient Visit Completed

## 2023-01-23 NOTE — Patient Instructions (Signed)
Visit Information  Thank you for taking time to visit with me today. Please don't hesitate to contact me if I can be of assistance to you.   Following are the goals we discussed today:   Goals Addressed             This Visit's Progress    To have abdominal pain further evaluated and treated   On track    Care Coordination Interventions: Evaluation of current treatment plan related to abdominal pain  and patient's adherence to plan as established by provider Reviewed and discussed with patient her recent follow up with Dr. Twanna Hy for evaluation of stomach cancer Review of patient status, including review of consultant's reports, relevant laboratory and other test results, and medications completed Assessed for nutritional status, discussed patient's daughter continues to help with meal prep and meal planning, patient is taking in adequate protein and maintaining her weight Educated patient about the importance of exercise, including light weight lifting to help build muscle mass, strength and endurance  Reviewed and discussed with patient her next upcoming scheduled follow up with Dr. Twanna Hy scheduled for 03/23/23 @1 :30 PM for labs 03/23/23 @1 :45 PM  Instructed patient to contact her doctor to report new symptoms or concerns promptly         Our next appointment is by telephone on 03/27/23 at 10:30 AM  Please call the care guide team at [phone] if you need to cancel or reschedule your appointment.   If you are experiencing a Mental Health or Behavioral Health Crisis or need someone to talk to, please call 1-800-273-TALK (toll free, 24 hour hotline)  Patient verbalizes understanding of instructions and care plan provided today and agrees to view in MyChart. Active MyChart status and patient understanding of how to access instructions and care plan via MyChart confirmed with patient.     Delsa Sale RN BSN CCM [place]  [facility], [facility] Nurse Care  Coordinator  Direct Dial: [phone] Website: [email or web address]

## 2023-03-05 DIAGNOSIS — H401131 Primary open-angle glaucoma, bilateral, mild stage: Secondary | ICD-10-CM | POA: Diagnosis not present

## 2023-03-05 DIAGNOSIS — H25813 Combined forms of age-related cataract, bilateral: Secondary | ICD-10-CM | POA: Diagnosis not present

## 2023-03-05 DIAGNOSIS — G35 Multiple sclerosis: Secondary | ICD-10-CM | POA: Diagnosis not present

## 2023-03-06 ENCOUNTER — Telehealth: Payer: Self-pay | Admitting: Internal Medicine

## 2023-03-06 NOTE — Telephone Encounter (Signed)
 I scheduled her for Friday, this is the earliest she can come, to be seen for hand pain, this is what Dr Beverely Low gave her the Robaxin for, she has not had it filled sine 05/27/2021. She will also get her CPE fasting Labs then because her AMW is scheduled for 03/22/2023

## 2023-03-06 NOTE — Telephone Encounter (Signed)
 Copied from CRM [id number]. Topic: Clinical - Medication Question >> Mar 06, 2023 11:18 AM Erika Murphy wrote: Reason for CRM: Need to get a refill on methocarbamol (ROBAXIN) 500 MG tablet but it was prescribed by another doctor that she does not see anymore.  Asking if Dr Lenord Fellers will submit a new prescription for her. Please call [phone]

## 2023-03-06 NOTE — Progress Notes (Signed)
 Patient Care Team: Margaree Mackintosh, MD as PCP - General (Internal Medicine) Myna Hidalgo Rose Phi, MD as Consulting Physician (Oncology) Clarene Duke Karma Lew, RN as [facility] Care Management  Visit Date: 03/09/23  Subjective:   Chief Complaint  Patient presents with   Hand Pain  Patient BM:WUXL K Armstrong,Female DOB:09/02/1950,72 y.o. KGM:[id number]   73 y.o. Female presents today for Hand Pain. Patient has a past medical history of Arthritis; MS. Was being seen by Dr. Beverely Low with [facility], which is who initially prescribed Robaxin. Says that the Robaxin helps relieve muscle spasms/contractures when they occur. Notes that since her R. Reverse Shoulder Arthroplasty, her right arm has stayed constantly painful.   Also mentions that she thinks she may have a UTI as for a while now every time she goes to the bathroom she experiences burning dysuria and noticed that when wiping it feels sticky. Attributed this to her soaps, but symptoms did not improve after changing products, she says.  Past Medical History:  Diagnosis Date   Anxiety    Arthritis    "right shoulder" (10/09/2013)   Asthma    pt denies   B12 deficiency anemia    pt denies   Fibromyalgia    GERD (gastroesophageal reflux disease)    History of blood transfusion 2001   "when I had my stomach cancer"   Hypertension    IBS (irritable bowel syndrome)    Iron deficiency anemia 12/11/2014   Malabsorption of iron 12/11/2014   MS (multiple sclerosis) (HCC) dx'd ~ 1998   Neurogenic bladder    due to MS   Osteopenia    Osteoporosis    PONV (postoperative nausea and vomiting)    Stomach cancer (HCC) 2001   Thyroid nodule    right    No Known Allergies  Family History  Problem Relation Age of Onset   Heart disease Mother    Diabetes Father    Stomach cancer Brother    Multiple sclerosis Other    Breast cancer Sister        2 sisters breast cancer    Breast cancer Maternal Aunt    Social History   Social History Narrative    Patient is retired.    Patient is married with 3 children.   Patient drinks caffeine daily.    Review of Systems  Genitourinary:  Positive for dysuria (burning).  Musculoskeletal:  Positive for joint pain (hand).  All other systems reviewed and are negative.    Objective:  Vitals: BP 120/80   Pulse 72   Temp 97.7 F (36.5 C)   Ht 5\' 4"  (1.626 m)   Wt 83 lb (37.6 kg)   SpO2 98%   BMI 14.25 kg/m   Physical Exam Vitals and nursing note reviewed.  Constitutional:      General: She is not in acute distress.    Appearance: Normal appearance. She is not toxic-appearing.  HENT:     Head: Normocephalic and atraumatic.  Pulmonary:     Effort: Pulmonary effort is normal.  Skin:    General: Skin is warm and dry.  Neurological:     Mental Status: She is alert and oriented to person, place, and time. Mental status is at baseline.  Psychiatric:        Mood and Affect: Mood normal.        Behavior: Behavior normal.        Thought Content: Thought content normal.        Judgment: Judgment normal.  Results:  Studies Obtained And Personally Reviewed By Me: Labs:     Component Value Date/Time   NA 142 01/19/2023 1219   NA 144 06/12/2016 1151   NA 142 06/11/2015 1156   K 4.2 01/19/2023 1219   K 4.7 06/12/2016 1151   K 4.3 06/11/2015 1156   CL 106 01/19/2023 1219   CL 107 06/12/2016 1151   CO2 30 01/19/2023 1219   CO2 32 06/12/2016 1151   CO2 29 06/11/2015 1156   GLUCOSE 82 01/19/2023 1219   GLUCOSE 88 06/12/2016 1151   BUN 12 01/19/2023 1219   BUN 11 06/12/2016 1151   BUN 13.5 06/11/2015 1156   CREATININE 0.86 01/19/2023 1219   CREATININE 0.93 08/04/2022 1118   CREATININE 1.2 (H) 06/11/2015 1156   CALCIUM 9.1 01/19/2023 1219   CALCIUM 9.6 06/12/2016 1151   CALCIUM 9.7 06/11/2015 1156   PROT 7.7 01/19/2023 1219   PROT 7.5 06/12/2016 1151   PROT 7.5 06/11/2015 1156   ALBUMIN 4.5 01/19/2023 1219   ALBUMIN 3.8 06/12/2016 1151   ALBUMIN 3.8 06/11/2015 1156   AST  32 01/19/2023 1219   AST 30 06/11/2015 1156   ALT 24 01/19/2023 1219   ALT 26 06/12/2016 1151   ALT 23 06/11/2015 1156   ALKPHOS 72 01/19/2023 1219   ALKPHOS 97 (H) 06/12/2016 1151   ALKPHOS 111 06/11/2015 1156   BILITOT 0.8 01/19/2023 1219   BILITOT 1.03 06/11/2015 1156   GFRNONAA >60 01/19/2023 1219   GFRNONAA 73 01/16/2020 0949   GFRAA 85 01/16/2020 0949    Lab Results  Component Value Date   WBC 2.8 (L) 01/19/2023   HGB 12.2 01/19/2023   HCT 37.5 01/19/2023   MCV 93.5 01/19/2023   PLT 160 01/19/2023   Lab Results  Component Value Date   CHOL 139 08/04/2022   HDL 51 08/04/2022   LDLCALC 71 08/04/2022   TRIG 88 08/04/2022   CHOLHDL 2.7 08/04/2022   Lab Results  Component Value Date   HGBA1C 5.0 05/13/2021    Lab Results  Component Value Date   TSH 0.63 08/04/2022    Results for orders placed or performed in visit on 03/09/23  POCT URINALYSIS DIP (CLINITEK)  Result Value Ref Range   Color, UA yellow yellow   Clarity, UA clear clear   Glucose, UA negative negative mg/dL   Bilirubin, UA negative negative   Ketones, POC UA negative negative mg/dL   Spec Grav, UA 1.610 9.604 - 1.025   Blood, UA negative negative   pH, UA 6.0 5.0 - 8.0   POC PROTEIN,UA trace negative, trace   Urobilinogen, UA 0.2 0.2 or 1.0 E.U./dL   Nitrite, UA Negative Negative   Leukocytes, UA Negative Negative   Assessment & Plan:   Multiple Sclerosis: musculoskeletal pain managed with 500 mg Robaxin daily as needed when they occur. Refilled today. She says one tab daily is enough and does not need multiple doses daily.  Dysuria: c/o burning with urination. UA clear today.  Sending in Cipro 250 mg #14 tabs - take 1 tablet (500 mg total) by mouth 2 (two) times daily for 7 days. No culture was done.    I,Emily Lagle,acting as a Neurosurgeon for Margaree Mackintosh, MD.,have documented all relevant documentation on the behalf of Margaree Mackintosh, MD,as directed by  Margaree Mackintosh, MD while in the presence  of Margaree Mackintosh, MD.   I, Margaree Mackintosh, MD, have reviewed all documentation for this visit. The  documentation on 03/09/23 for the exam, diagnosis, procedures, and orders are all accurate and complete.

## 2023-03-09 ENCOUNTER — Encounter: Admitting: Internal Medicine

## 2023-03-09 ENCOUNTER — Ambulatory Visit (INDEPENDENT_AMBULATORY_CARE_PROVIDER_SITE_OTHER): Admitting: Internal Medicine

## 2023-03-09 ENCOUNTER — Other Ambulatory Visit

## 2023-03-09 ENCOUNTER — Encounter: Payer: Self-pay | Admitting: Internal Medicine

## 2023-03-09 VITALS — BP 120/80 | HR 72 | Temp 97.7°F | Ht 64.0 in | Wt 83.0 lb

## 2023-03-09 DIAGNOSIS — G35 Multiple sclerosis: Secondary | ICD-10-CM | POA: Diagnosis not present

## 2023-03-09 DIAGNOSIS — R300 Dysuria: Secondary | ICD-10-CM

## 2023-03-09 DIAGNOSIS — D649 Anemia, unspecified: Secondary | ICD-10-CM

## 2023-03-09 DIAGNOSIS — M810 Age-related osteoporosis without current pathological fracture: Secondary | ICD-10-CM | POA: Diagnosis not present

## 2023-03-09 DIAGNOSIS — M62838 Other muscle spasm: Secondary | ICD-10-CM | POA: Diagnosis not present

## 2023-03-09 DIAGNOSIS — Z0000 Encounter for general adult medical examination without abnormal findings: Secondary | ICD-10-CM | POA: Diagnosis not present

## 2023-03-09 DIAGNOSIS — I10 Essential (primary) hypertension: Secondary | ICD-10-CM | POA: Diagnosis not present

## 2023-03-09 LAB — POCT URINALYSIS DIP (CLINITEK)
Bilirubin, UA: NEGATIVE
Blood, UA: NEGATIVE
Glucose, UA: NEGATIVE mg/dL
Ketones, POC UA: NEGATIVE mg/dL
Leukocytes, UA: NEGATIVE
Nitrite, UA: NEGATIVE
Spec Grav, UA: 1.02 (ref 1.010–1.025)
Urobilinogen, UA: 0.2 U/dL
pH, UA: 6 (ref 5.0–8.0)

## 2023-03-09 MED ORDER — METHOCARBAMOL 500 MG PO TABS: 500.0000 mg | ORAL_TABLET | Freq: Three times a day (TID) | ORAL | 1 refills | Status: DC | PRN

## 2023-03-09 MED ORDER — CIPROFLOXACIN HCL 250 MG PO TABS: 250.0000 mg | ORAL_TABLET | Freq: Two times a day (BID) | ORAL | 0 refills | Status: DC

## 2023-03-10 LAB — CBC WITH DIFFERENTIAL/PLATELET
Absolute Lymphocytes: 1000 {cells}/uL (ref 850–3900)
Absolute Monocytes: 269 {cells}/uL (ref 200–950)
Basophils Absolute: 31 {cells}/uL (ref 0–200)
Basophils Relative: 0.9 %
Eosinophils Absolute: 31 {cells}/uL (ref 15–500)
Eosinophils Relative: 0.9 %
HCT: 38.8 % (ref 35.0–45.0)
Hemoglobin: 12.5 g/dL (ref 11.7–15.5)
MCH: 30.2 pg (ref 27.0–33.0)
MCHC: 32.2 g/dL (ref 32.0–36.0)
MCV: 93.7 fL (ref 80.0–100.0)
MPV: 11.2 fL (ref 7.5–12.5)
Monocytes Relative: 7.9 %
Neutro Abs: 2071 {cells}/uL (ref 1500–7800)
Neutrophils Relative %: 60.9 %
Platelets: 172 10*3/uL (ref 140–400)
RBC: 4.14 10*6/uL (ref 3.80–5.10)
RDW: 11.5 % (ref 11.0–15.0)
Total Lymphocyte: 29.4 %
WBC: 3.4 10*3/uL — ABNORMAL LOW (ref 3.8–10.8)

## 2023-03-10 LAB — COMPLETE METABOLIC PANEL WITH GFR
AG Ratio: 1.3 (calc) (ref 1.0–2.5)
ALT: 39 U/L — ABNORMAL HIGH (ref 6–29)
AST: 40 U/L — ABNORMAL HIGH (ref 10–35)
Albumin: 4.3 g/dL (ref 3.6–5.1)
Alkaline phosphatase (APISO): 68 U/L (ref 37–153)
BUN: 16 mg/dL (ref 7–25)
CO2: 28 mmol/L (ref 20–32)
Calcium: 9.7 mg/dL (ref 8.6–10.4)
Chloride: 103 mmol/L (ref 98–110)
Creat: 0.95 mg/dL (ref 0.60–1.00)
Globulin: 3.4 g/dL (ref 1.9–3.7)
Glucose, Bld: 86 mg/dL (ref 65–99)
Potassium: 4.5 mmol/L (ref 3.5–5.3)
Sodium: 140 mmol/L (ref 135–146)
Total Bilirubin: 0.7 mg/dL (ref 0.2–1.2)
Total Protein: 7.7 g/dL (ref 6.1–8.1)
eGFR: 63 mL/min/{1.73_m2} (ref >=60)

## 2023-03-10 LAB — LIPID PANEL
Cholesterol: 189 mg/dL (ref <200)
HDL: 70 mg/dL (ref >=50)
LDL Cholesterol (Calc): 99 mg/dL
Non-HDL Cholesterol (Calc): 119 mg/dL (ref <130)
Total CHOL/HDL Ratio: 2.7 (calc) (ref <5.0)
Triglycerides: 100 mg/dL (ref <150)

## 2023-03-10 LAB — TSH: TSH: 0.62 m[IU]/L (ref 0.40–4.50)

## 2023-03-11 NOTE — Patient Instructions (Signed)
 Robaxin has been refilled as requested.  Please take Cipro 250 mg twice daily for 7 days for dysuria.  No culture was done today.

## 2023-03-16 ENCOUNTER — Inpatient Hospital Stay: Payer: Medicare HMO | Admitting: Hematology & Oncology

## 2023-03-16 ENCOUNTER — Encounter: Payer: Self-pay | Admitting: *Deleted

## 2023-03-16 ENCOUNTER — Inpatient Hospital Stay: Payer: Medicare HMO | Attending: Hematology & Oncology

## 2023-03-16 ENCOUNTER — Encounter: Payer: Self-pay | Admitting: Hematology & Oncology

## 2023-03-16 VITALS — BP 127/58 | HR 67 | Temp 98.6°F | Resp 20 | Ht 64.0 in | Wt 85.4 lb

## 2023-03-16 DIAGNOSIS — D509 Iron deficiency anemia, unspecified: Secondary | ICD-10-CM | POA: Insufficient documentation

## 2023-03-16 DIAGNOSIS — E538 Deficiency of other specified B group vitamins: Secondary | ICD-10-CM

## 2023-03-16 DIAGNOSIS — D500 Iron deficiency anemia secondary to blood loss (chronic): Secondary | ICD-10-CM

## 2023-03-16 DIAGNOSIS — Z85028 Personal history of other malignant neoplasm of stomach: Secondary | ICD-10-CM | POA: Insufficient documentation

## 2023-03-16 DIAGNOSIS — G35 Multiple sclerosis: Secondary | ICD-10-CM | POA: Diagnosis not present

## 2023-03-16 DIAGNOSIS — D510 Vitamin B12 deficiency anemia due to intrinsic factor deficiency: Secondary | ICD-10-CM | POA: Diagnosis not present

## 2023-03-16 DIAGNOSIS — D002 Carcinoma in situ of stomach: Secondary | ICD-10-CM

## 2023-03-16 LAB — IRON AND IRON BINDING CAPACITY (CC-WL,HP ONLY)
Iron: 120 ug/dL (ref 28–170)
Saturation Ratios: 33 % — ABNORMAL HIGH (ref 10.4–31.8)
TIBC: 367 ug/dL (ref 250–450)
UIBC: 247 ug/dL (ref 148–442)

## 2023-03-16 LAB — CMP (CANCER CENTER ONLY)
ALT: 27 U/L (ref 0–44)
AST: 33 U/L (ref 15–41)
Albumin: 4.6 g/dL (ref 3.5–5.0)
Alkaline Phosphatase: 56 U/L (ref 38–126)
Anion gap: 7 (ref 5–15)
BUN: 24 mg/dL — ABNORMAL HIGH (ref 8–23)
CO2: 29 mmol/L (ref 22–32)
Calcium: 9.5 mg/dL (ref 8.9–10.3)
Chloride: 108 mmol/L (ref 98–111)
Creatinine: 0.99 mg/dL (ref 0.44–1.00)
GFR, Estimated: 60 mL/min — ABNORMAL LOW (ref >60)
Glucose, Bld: 99 mg/dL (ref 70–99)
Potassium: 4.3 mmol/L (ref 3.5–5.1)
Sodium: 144 mmol/L (ref 135–145)
Total Bilirubin: 0.7 mg/dL (ref 0.0–1.2)
Total Protein: 7.4 g/dL (ref 6.5–8.1)

## 2023-03-16 LAB — CBC WITH DIFFERENTIAL (CANCER CENTER ONLY)
Abs Immature Granulocytes: 0.01 10*3/uL (ref 0.00–0.07)
Basophils Absolute: 0 10*3/uL (ref 0.0–0.1)
Basophils Relative: 1 %
Eosinophils Absolute: 0.1 10*3/uL (ref 0.0–0.5)
Eosinophils Relative: 3 %
HCT: 36.9 % (ref 36.0–46.0)
Hemoglobin: 11.6 g/dL — ABNORMAL LOW (ref 12.0–15.0)
Immature Granulocytes: 0 %
Lymphocytes Relative: 35 %
Lymphs Abs: 1 10*3/uL (ref 0.7–4.0)
MCH: 30.1 pg (ref 26.0–34.0)
MCHC: 31.4 g/dL (ref 30.0–36.0)
MCV: 95.8 fL (ref 80.0–100.0)
Monocytes Absolute: 0.3 10*3/uL (ref 0.1–1.0)
Monocytes Relative: 10 %
Neutro Abs: 1.5 10*3/uL — ABNORMAL LOW (ref 1.7–7.7)
Neutrophils Relative %: 51 %
Platelet Count: 141 10*3/uL — ABNORMAL LOW (ref 150–400)
RBC: 3.85 MIL/uL — ABNORMAL LOW (ref 3.87–5.11)
RDW: 12.2 % (ref 11.5–15.5)
WBC Count: 3 10*3/uL — ABNORMAL LOW (ref 4.0–10.5)
nRBC: 0 % (ref 0.0–0.2)

## 2023-03-16 LAB — PREALBUMIN: Prealbumin: 22 mg/dL (ref 18–38)

## 2023-03-16 LAB — FERRITIN: Ferritin: 98 ng/mL (ref 11–307)

## 2023-03-16 LAB — LACTATE DEHYDROGENASE: LDH: 141 U/L (ref 98–192)

## 2023-03-16 NOTE — Progress Notes (Signed)
 Hematology and Oncology Follow Up Visit  Erika Murphy [id number] 1950/12/11 73 y.o. 03/16/2023   Principle Diagnosis:  Stage II (T3N0M0) adenocarcinoma of the stomach-remission Recurrent iron deficiency anemia  Pernicious anemia Multiple sclerosis Current Therapy:   IV iron as indicated -- Venofer was last given on 07/21/2022 Vitamin B-12 1 mg IM every month --done at home Prolia 60 mg IM q 6 months -- next dose in 05/2023 at primary care provider's office     Interim History:  Erika Murphy returns for follow-up.  She has gained a little bit of weight.  She did have a very nice dinner at a [employer] for her daughter's birthday I think yesterday.  I am glad that she is able to eat.  She has a good appetite.  She is eating pretty well.  She really is not using the Megace elixir.  She has had no fever.  She has had no change in bowel or bladder habits.  She has had no flareups of the multiple sclerosis.  She has had no rashes.  There has been no bleeding.  Her last iron studies that were done back in January showed a ferritin of 106 with an iron saturation of 29%.  Her last vitamin B12 level was 400 back in January.  She has had no cough.  She has had no headache.  There is been no mouth sores.  Her last scans were done back in August 2014.  Everything looked pretty good without any obvious evidence of any kind of cancer recurrence.  Has been over 20 years that she had stomach cancer.  Currently, I would say performance status is probably ECOG 2-3.  Medications:  Current Outpatient Medications:    albuterol (VENTOLIN HFA) 108 (90 Base) MCG/ACT inhaler, INHALE 2 PUFFS INTO THE LUNGS EVERY 6 HOURS AS NEEDED FOR SHORTNESS OF BREATH/WHEEZING, Disp: 8.5 each, Rfl: 11   ALPRAZolam (XANAX) 0.5 MG tablet, Take one half tab twice daily as needed for anxiety. (Patient taking differently: Take 0.25-0.5 mg by mouth 2 (two) times daily as needed for anxiety. Take one half tab twice daily as  needed for anxiety.), Disp: 60 tablet, Rfl: 1   amLODipine (NORVASC) 2.5 MG tablet, TAKE 1 TABLET BY MOUTH EVERY DAY, Disp: 90 tablet, Rfl: 3   cyanocobalamin (VITAMIN B12) 1000 MCG/ML injection, INJECT 1 ML (1,000 MCG TOTAL) INTO THE MUSCLE ONCE A MONTH AS DIRECTED, Disp: 3 mL, Rfl: 3   denosumab (PROLIA) 60 MG/ML SOSY injection, Inject 60 mg into the skin every 6 (six) months., Disp: , Rfl:    esomeprazole (NEXIUM) 40 MG capsule, TAKE 1 CAPSULE BY MOUTH DAILY AT 12 NOON, Disp: 90 capsule, Rfl: 3   Multiple Vitamins-Minerals (MULTIVITAMIN GUMMIES WOMENS PO), Take 1 tablet by mouth daily., Disp: , Rfl:    pregabalin (LYRICA) 50 MG capsule, TAKE 1 CAPSULE BY MOUTH THREE TIMES A DAY, Disp: 90 capsule, Rfl: 1   rosuvastatin (CRESTOR) 5 MG tablet, TAKE 1 TABLET (5 MG TOTAL) BY MOUTH DAILY., Disp: 90 tablet, Rfl: 3   cholestyramine light (PREVALITE) 4 g packet, Take 4 g by mouth daily. (Patient not taking: Reported on 03/16/2023), Disp: , Rfl:    ciprofloxacin (CIPRO) 250 MG tablet, Take 1 tablet (250 mg total) by mouth 2 (two) times daily. (Patient not taking: Reported on 03/16/2023), Disp: 14 tablet, Rfl: 0   meclizine (ANTIVERT) 25 MG tablet, Take 1 tablet (25 mg total) by mouth 3 (three) times daily as needed for dizziness. (  Patient not taking: Reported on 03/16/2023), Disp: 30 tablet, Rfl: 0   megestrol (MEGACE ES) 625 MG/5ML suspension, Take 5 mLs (625 mg total) by mouth daily. (Patient not taking: Reported on 03/16/2023), Disp: 150 mL, Rfl: 5   meloxicam (MOBIC) 7.5 MG tablet, Take 1 tablet (7.5 mg total) by mouth daily. (Patient not taking: Reported on 03/16/2023), Disp: 12 tablet, Rfl: 0   methocarbamol (ROBAXIN) 500 MG tablet, Take 1 tablet (500 mg total) by mouth every 8 (eight) hours as needed for muscle spasms. (Patient not taking: Reported on 03/16/2023), Disp: 60 tablet, Rfl: 1  Allergies: No Known Allergies  Past Medical History, Surgical history, Social history, and Family History were  reviewed and updated.  Review of Systems: Review of Systems  Constitutional:  Positive for malaise/fatigue.  HENT:  Positive for tinnitus.   Eyes:  Positive for blurred vision.  Respiratory: Negative.    Cardiovascular: Negative.   Gastrointestinal:  Positive for constipation. Negative for diarrhea.  Genitourinary: Negative.   Musculoskeletal:  Positive for myalgias.  Skin: Negative.   Neurological:  Positive for tingling and weakness.  Endo/Heme/Allergies: Negative.   Psychiatric/Behavioral: Negative.      Physical Exam:  height is 5\' 4"  (1.626 m) and weight is 85 lb 6.4 oz (38.7 kg). Her oral temperature is 98.6 F (37 C). Her blood pressure is 127/58 (abnormal) and her pulse is 67. Her respiration is 20 and oxygen saturation is 100%.   Physical Exam Vitals reviewed.  Constitutional:      Comments: Very thin African-American female.  She does have some temporal muscle wasting.  There is no adenopathy.  There is no rash.  She has had no palpable liver or spleen.  HENT:     Head: Normocephalic and atraumatic.  Eyes:     Pupils: Pupils are equal, round, and reactive to light.  Cardiovascular:     Rate and Rhythm: Normal rate and regular rhythm.     Heart sounds: Normal heart sounds.  Pulmonary:     Effort: Pulmonary effort is normal.     Breath sounds: Normal breath sounds.  Abdominal:     General: Bowel sounds are normal.     Palpations: Abdomen is soft.  Musculoskeletal:        General: No tenderness or deformity. Normal range of motion.     Cervical back: Normal range of motion.     Comments: She has symmetric muscle atrophy in upper and lower extremities.  She has marked limited range of motion of the right shoulder.  Lymphadenopathy:     Cervical: No cervical adenopathy.  Skin:    General: Skin is warm and dry.     Findings: No erythema or rash.  Neurological:     Mental Status: She is alert and oriented to person, place, and time.  Psychiatric:        Behavior:  Behavior normal.        Thought Content: Thought content normal.        Judgment: Judgment normal.      Lab Results  Component Value Date   WBC 3.0 (L) 03/16/2023   HGB 11.6 (L) 03/16/2023   HCT 36.9 03/16/2023   MCV 95.8 03/16/2023   PLT 141 (L) 03/16/2023     Chemistry      Component Value Date/Time   NA 144 03/16/2023 1026   NA 144 06/12/2016 1151   NA 142 06/11/2015 1156   K 4.3 03/16/2023 1026   K 4.7 06/12/2016 1151  K 4.3 06/11/2015 1156   CL 108 03/16/2023 1026   CL 107 06/12/2016 1151   CO2 29 03/16/2023 1026   CO2 32 06/12/2016 1151   CO2 29 06/11/2015 1156   BUN 24 (H) 03/16/2023 1026   BUN 11 06/12/2016 1151   BUN 13.5 06/11/2015 1156   CREATININE 0.99 03/16/2023 1026   CREATININE 0.95 03/09/2023 1202   CREATININE 1.2 (H) 06/11/2015 1156      Component Value Date/Time   CALCIUM 9.5 03/16/2023 1026   CALCIUM 9.6 06/12/2016 1151   CALCIUM 9.7 06/11/2015 1156   ALKPHOS 56 03/16/2023 1026   ALKPHOS 97 (H) 06/12/2016 1151   ALKPHOS 111 06/11/2015 1156   AST 33 03/16/2023 1026   AST 30 06/11/2015 1156   ALT 27 03/16/2023 1026   ALT 26 06/12/2016 1151   ALT 23 06/11/2015 1156   BILITOT 0.7 03/16/2023 1026   BILITOT 1.03 06/11/2015 1156      Impression and Plan: Erika Murphy is a 72 year old African [place] female. She had a history of stage II stomach cancer. She underwent resection. She had adjuvant therapy.  She  is now over 23 years out from treatment. She is cured of the stomach cancer.   The big event this year will be her 73th birthday.  She has a twin sister that is going to come down for this.  I am so excited for her.  I would like to see her back in about 6 or 7 weeks.  I would like to hope that she will gain a little bit of weight.  We will see what her iron studies look like.   Josph Macho, MD 3/14/202512:17 PM

## 2023-03-19 NOTE — Progress Notes (Signed)
 Annual Wellness Visit   Patient Care Team: Braiden Rodman, Luanna Cole, MD as PCP - General (Internal Medicine) Myna Hidalgo Rose Phi, MD as Consulting Physician (Oncology) Clarene Duke Karma Lew, RN as [facility] Care Management  Visit Date: 03/23/23   Chief Complaint  Patient presents with  . Medicare Wellness  . Annual Exam  . right hip pain   Subjective:  Patient: Erika Murphy, Female DOB: 19-Sep-1950, 73 y.o. MRN: [id number] BP Readings from Last 1 Encounters:  03/22/23 122/72   Erika Murphy is a 73 y.o. Female who presents today for her Annual Wellness Visit. Patient has history of iron deficiency anemia, osteoporosis,Multiple sclerosis, spastic gait disorder with lower distal extremity weakness and proximal weakness  She says that she has been having pain in her right hip and would like to see Dr. Samson Frederic regarding this as he also performed her Right Hip Arthroplasty in 2016 following a Right Femoral Fracture.  Also mentions that she has been having back pain affecting her ability to sleep. When asked where the pain is, she points the area of the left posteriorsuperior iliac-spine. She takes Robaxin for muscle spasms, and has been informed that she can also try this for her back pain.   Patient says Dr. Ranell Patrick has recommended she undergo right shoulder arthroplasty.  She saw Dr. Ranell Patrick in mid December with pain in right shoulder and neck.  Was thought to have rotator cuff tear arthropathy.  History of Iron Deficiency Anemia 03/2023 RBC 3.85, decreased from being within normal range of 3.88 - 4.14 from October 2024 to March 7th 2025 and fluctuated from being in range and below throughout 2024; Hemoglobin 11.6, decreased from being within normal range from November 2024 - March 7th and is not too low from prior trends; Platelets 141, decreased from being within normal range from October 2024 - March 7th, but an improvement from 117 in Sept 2024; NeutoAbs 1.5, decreased from within normal limits on 3/7, but  is an improvement from the prior trends of 1.2 - 1.4 Sept 2024 - January 17th 2025.     osteoporosis,Multiple sclerosis which was diagnosed in 1998.  Sometimes she falls.  History of spastic gait disorder with lower distal extremity weakness and proximal weakness.  This presented as weakness in the right lower extremity.  She took Betaseron for a while but did not like the way it made her feel and discontinued it.  She ambulates with a cane due to gait disorder.  History of neuropathic pain involving upper and lower extremities treated with Lyrica.  History of left shoulder arthroplasty by Dr. Ranell Patrick in 2011.  History of vitamin B-12 deficiency due to gastrectomy which she had due to gastric cancer.  Family member gives her monthly B12 injections.  She has had some memory issues in the past.  An MRI of the brain without contrast in January 2018 was stable compared to the one done in 2015.  Sometimes has urinary tract infection secondary to neurogenic bladder from multiple sclerosis.  Labs 03/2023 03/16/2023 CBC, compared to prior trends: WBC 3.0, down from 3.4 on 3/7, though elevated from 2.0 - 3.2 through 2024 into 01/2023; RBC 3.85, decreased from being within normal range of 3.88 - 4.14 from October 2024 to March 7th 2025 and fluctuated from being in range and below throughout 2024; Hemoglobin 11.6, decreased from being within normal range from November 2024 - March 7th and is not too low from prior trends; Platelets 141, decreased from being within normal range  from October 2024 - March 7th, but an improvement from 117 in Sept 2024; NeutoAbs 1.5, decreased from within normal limits on 3/7, but is an improvement from the prior trends of 1.2 - 1.4 Sept 2024 - January 17th 2025.    03/16/2023 CMP, compared to 03/09/2023: BUN 24, elevated from 16; otherwise WNL.   Lipid Panel: WNL TSH: 0.62  PAP Smear 02/24/2021 normal.  Mammogram 04/28/2022 normal with repeat recommendation of 2025.  Colonoscopy  05/16/2022 with repeat recommendation of 2034. History of Gastric Cancer followed by Dr. Myna Hidalgo, Oncologist, who she recently saw on 3/14. Dr. Elnoria Howard is her Gastroenterologist. Endoscopy on November 5th 2024 showing a normal esophagus, a single mucosal papule and erythematous mucosa in the stomach were both biopsied (papule biopsy found to be a gastric hyperplastic polyp and mucosa biopsy found to be reactive gastropathy; otherwise w/o any dysplasia or malignancy, no H. Pylori identified, or intestinal metaplasia observed) patent billroth II gastrojejunostomy, and a normal jejunum. She has difficulty gaining and maintaining weight due to her gastrojejunostomy, but has remained consistently above 80 pounds throughout 2024 and today is 85 pounds.   Bone Density 12/02/2021 T-score Forearm Radius -2.8 with repeat recommendation of 2025.   Vaccine Counseling: Due for Flu, Covid-19, Shingles 1/2, and PNA - postponed per patient preference. Past Medical History:  Diagnosis Date  . Anxiety   . Arthritis    "right shoulder" (10/09/2013)  . Asthma    pt denies  . B12 deficiency anemia    pt denies  . Fibromyalgia   . GERD (gastroesophageal reflux disease)   . History of blood transfusion 2001   "when I had my stomach cancer"  . Hypertension   . IBS (irritable bowel syndrome)   . Iron deficiency anemia 12/11/2014  . Malabsorption of iron 12/11/2014  . MS (multiple sclerosis) (HCC) dx'd ~ 1998  . Neurogenic bladder    due to MS  . Osteopenia   . Osteoporosis   . PONV (postoperative nausea and vomiting)   . Stomach cancer (HCC) 2001  . Thyroid nodule    right  Medical/Surgical History Narrative:   In January 2021 she weighed 100 pounds.  Maintaining her weight has been an issue since surgery for carcinoma of the stomach with subtotal gastrectomy and partial omentectomy in 2001.   History of H. pylori infection in 2006   History of osteopenia   No known drug allergies   History of multiple  sclerosis diagnosed in 1998.  She falls sometimes.  History of spastic gait disorder with lower distal weakness and proximal weakness.  Dr. Sandria Manly, neurologist diagnosed her with multiple sclerosis.  She presented with dragging her right leg.  She took Betaseron for a while but did not like the way it made her feel and she discontinued it.  She ambulates with a cane due to gait disorder.   History of neuropathic pain involving upper and lower extremities treated with Lyrica.   History of left shoulder arthroplasty by Dr. Ranell Patrick in 2011.   History of vitamin B12 deficiency due to gastrectomy which she had due to gastric cancer.  Family member gives her monthly B12 injections.   She has had some memory issues in the past.  An MRI of the brain without contrast in January 2018 was stable compared to one done in 2015.   In 2016 she had right total hip arthroplasty for right femoral fracture.   She is followed by Dr. Myna Hidalgo, oncologist for history of gastric cancer.  Sometimes has urinary tract infection secondary to neurogenic bladder from multiple sclerosis.   She had colonoscopy  and endoscopy in 2019 by Dr. Elnoria Howard with no evidence of recurrent cancer.   Family History  Problem Relation Age of Onset  . Heart disease Mother   . Diabetes Father   . Stomach cancer Brother   . Multiple sclerosis Other   . Breast cancer Sister        2 sisters breast cancer   . Breast cancer Maternal Aunt   Family History Narrative:  Family history: 1 sister died status post leg amputation with history of diabetes.  1 sister diagnosed with breast cancer.  Brother died with metastatic cancer but primary not known to patient. Social History   Social History Narrative   Patient is retired.    Patient is married with 3 children.   Patient drinks caffeine daily.   She does not drive.  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  She is now  disabled and retired.  She does not smoke or consume alcohol.  She resides alone.  Family is supportive. ROS  Objective:  Vitals: BP 122/72   Pulse 68   Temp 98 F (36.7 C)   Ht 5\' 5"  (1.651 m)   Wt 85 lb (38.6 kg)   SpO2 97%   BMI 14.14 kg/m  Physical Exam Most Recent Functional Status Assessment:    03/22/2023    3:10 PM  In your present state of health, do you have any difficulty performing the following activities:  Hearing? 0  Vision? 0  Difficulty concentrating or making decisions? 0  Walking or climbing stairs? 0  Dressing or bathing? 0  Doing errands, shopping? 0  Preparing Food and eating ? N  Using the Toilet? N  In the past six months, have you accidently leaked urine? N  Do you have problems with loss of bowel control? N  Managing your Medications? N  Managing your Finances? N  Housekeeping or managing your Housekeeping? N   Most Recent Fall Risk Assessment:    03/09/2023   11:55 AM  Fall Risk   Falls in the past year? 1  Number falls in past yr: 1  Injury with Fall? 1  Follow up Falls evaluation completed;Falls prevention discussed;Education provided   Most Recent Depression Screenings:    03/09/2023   11:55 AM 02/27/2022    2:05 PM  PHQ 2/9 Scores  PHQ - 2 Score 0 0   Most Recent Cognitive Screening:    02/03/2022   10:06 AM  6CIT Screen  What Year? 0 points  What month? 0 points  What time? 0 points  Count back from 20 0 points  Months in reverse 4 points  Repeat phrase 2 points  Total Score 6 points   Results:  Studies Obtained And Personally Reviewed By Me:  {Imaging, colonoscopy, mammogram, bone density scan, echocardiogram, heart cath, stress test, CT calcium score, etc.:32292}  Labs:     Component Value Date/Time   NA 144 03/16/2023 1026   NA 144 06/12/2016 1151   NA 142 06/11/2015 1156   K 4.3 03/16/2023 1026   K 4.7 06/12/2016 1151   K 4.3 06/11/2015 1156   CL 108 03/16/2023 1026   CL 107 06/12/2016 1151   CO2 29 03/16/2023  1026   CO2 32 06/12/2016 1151   CO2 29 06/11/2015 1156   GLUCOSE 99 03/16/2023 1026  GLUCOSE 88 06/12/2016 1151   BUN 24 (H) 03/16/2023 1026   BUN 11 06/12/2016 1151   BUN 13.5 06/11/2015 1156   CREATININE 0.99 03/16/2023 1026   CREATININE 0.95 03/09/2023 1202   CREATININE 1.2 (H) 06/11/2015 1156   CALCIUM 9.5 03/16/2023 1026   CALCIUM 9.6 06/12/2016 1151   CALCIUM 9.7 06/11/2015 1156   PROT 7.4 03/16/2023 1026   PROT 7.5 06/12/2016 1151   PROT 7.5 06/11/2015 1156   ALBUMIN 4.6 03/16/2023 1026   ALBUMIN 3.8 06/12/2016 1151   ALBUMIN 3.8 06/11/2015 1156   AST 33 03/16/2023 1026   AST 30 06/11/2015 1156   ALT 27 03/16/2023 1026   ALT 26 06/12/2016 1151   ALT 23 06/11/2015 1156   ALKPHOS 56 03/16/2023 1026   ALKPHOS 97 (H) 06/12/2016 1151   ALKPHOS 111 06/11/2015 1156   BILITOT 0.7 03/16/2023 1026   BILITOT 1.03 06/11/2015 1156   GFRNONAA 60 (L) 03/16/2023 1026   GFRNONAA 73 01/16/2020 0949   GFRAA 85 01/16/2020 0949    Lab Results  Component Value Date   WBC 3.0 (L) 03/16/2023   HGB 11.6 (L) 03/16/2023   HCT 36.9 03/16/2023   MCV 95.8 03/16/2023   PLT 141 (L) 03/16/2023   Lab Results  Component Value Date   CHOL 189 03/09/2023   HDL 70 03/09/2023   LDLCALC 99 03/09/2023   TRIG 100 03/09/2023   CHOLHDL 2.7 03/09/2023   Lab Results  Component Value Date   HGBA1C 5.0 05/13/2021    Lab Results  Component Value Date   TSH 0.62 03/09/2023     Assessment & Plan:   Multiple sclerosis diagnosed 1998  by Neurologist, Dr. Avie Echevaria, Stable. Currently takes Lyrica.Hx os spastic gait disorder with distal LE weakness and proximal weakness. Took Betaseron for a while, she did not like the way it made her feel and discontinued it. Ambulates with a cane.  Anxiety treated with Xanax twice daily as needed  Hx of  iron deficiency followed by Dr. Myna Hidalgo  Hx of osteoporosis- bone density 2021 showed T score -2.6 in forearm.  Done while  Hx of B12 deficiency due to  gastrectomy for gastric cancer. Family member gives her monthly B12 injections.  Hx of gastric cancer treated with gastrectomy-had endoscopy by Dr. Elnoria Howard November 2024.  Neuropathic pain from MS treated with Lyrica  Reverse right shoulder arthroplasty May 2023 by Dr. Ranell Patrick  Right hip arthroplasty for right femoral fracture 2016    Plan: Continue same meds and RTC in 6 months.        Annual wellness visit done today including the all of the following: Reviewed patient's Family Medical History Reviewed and updated list of patient's medical providers Assessment of cognitive impairment was done Assessed patient's functional ability Established a written schedule for health screening services Health Risk Assessent Completed and Reviewed  Discussed health benefits of physical activity, and encouraged her to engage in regular exercise appropriate for her age and condition.    I,Emily Lagle,acting as a Neurosurgeon for Margaree Mackintosh, MD.,have documented all relevant documentation on the behalf of Margaree Mackintosh, MD,as directed by  Margaree Mackintosh, MD while in the presence of Margaree Mackintosh, MD.   I, Margaree Mackintosh, MD, have reviewed all documentation for this visit. The documentation on 03/27/23 for the exam, diagnosis, procedures, and orders are all accurate and complete.

## 2023-03-22 ENCOUNTER — Encounter: Payer: Self-pay | Admitting: Internal Medicine

## 2023-03-22 ENCOUNTER — Ambulatory Visit (INDEPENDENT_AMBULATORY_CARE_PROVIDER_SITE_OTHER): Admitting: Internal Medicine

## 2023-03-22 VITALS — BP 122/72 | HR 68 | Temp 98.0°F | Ht 65.0 in | Wt 85.0 lb

## 2023-03-22 DIAGNOSIS — Z903 Acquired absence of stomach [part of]: Secondary | ICD-10-CM | POA: Diagnosis not present

## 2023-03-22 DIAGNOSIS — G35 Multiple sclerosis: Secondary | ICD-10-CM

## 2023-03-22 DIAGNOSIS — Z8639 Personal history of other endocrine, nutritional and metabolic disease: Secondary | ICD-10-CM

## 2023-03-22 DIAGNOSIS — E538 Deficiency of other specified B group vitamins: Secondary | ICD-10-CM | POA: Diagnosis not present

## 2023-03-22 DIAGNOSIS — E7800 Pure hypercholesterolemia, unspecified: Secondary | ICD-10-CM

## 2023-03-22 DIAGNOSIS — Z0000 Encounter for general adult medical examination without abnormal findings: Secondary | ICD-10-CM | POA: Diagnosis not present

## 2023-03-22 DIAGNOSIS — M810 Age-related osteoporosis without current pathological fracture: Secondary | ICD-10-CM

## 2023-03-22 DIAGNOSIS — N319 Neuromuscular dysfunction of bladder, unspecified: Secondary | ICD-10-CM | POA: Diagnosis not present

## 2023-03-22 DIAGNOSIS — Z9989 Dependence on other enabling machines and devices: Secondary | ICD-10-CM | POA: Diagnosis not present

## 2023-03-22 DIAGNOSIS — Z85028 Personal history of other malignant neoplasm of stomach: Secondary | ICD-10-CM

## 2023-03-22 DIAGNOSIS — Z96641 Presence of right artificial hip joint: Secondary | ICD-10-CM

## 2023-03-22 DIAGNOSIS — M19011 Primary osteoarthritis, right shoulder: Secondary | ICD-10-CM

## 2023-03-22 DIAGNOSIS — I10 Essential (primary) hypertension: Secondary | ICD-10-CM | POA: Diagnosis not present

## 2023-03-22 DIAGNOSIS — R54 Age-related physical debility: Secondary | ICD-10-CM

## 2023-03-22 DIAGNOSIS — M25551 Pain in right hip: Secondary | ICD-10-CM

## 2023-03-22 DIAGNOSIS — Z8781 Personal history of (healed) traumatic fracture: Secondary | ICD-10-CM

## 2023-03-23 ENCOUNTER — Ambulatory Visit: Payer: Medicare HMO | Admitting: Hematology & Oncology

## 2023-03-23 ENCOUNTER — Inpatient Hospital Stay: Payer: Medicare HMO

## 2023-03-27 ENCOUNTER — Encounter: Payer: Self-pay | Admitting: Internal Medicine

## 2023-03-27 ENCOUNTER — Ambulatory Visit: Payer: Self-pay

## 2023-03-27 NOTE — Patient Instructions (Signed)
 Visit Information  Thank you for taking time to visit with me today. Please don't hesitate to contact me if I can be of assistance to you.   Following are the goals we discussed today:   Goals Addressed             This Visit's Progress    COMPLETED: To have abdominal pain further evaluated and treated       Care Coordination Interventions: Evaluation of current treatment plan related to abdominal pain  and patient's adherence to plan as established by provider Reviewed and discussed with patient her recent follow up with Dr. Twanna Hy for evaluation of stomach cancer, determined patient's stomach Cancer remains to be in remission at this time  Assessed for nutritional status, discussed patient's daughter continues to help with meal prep and meal planning, patient is taking in adequate protein and maintaining her weight Educated patient about the importance of exercise, including light weight lifting to help build muscle mass, strength and endurance  Instructed patient to contact her doctor to report new symptoms or concerns promptly  Encouraged patient to contact Dr. Haywood Pao office to inquire about his recommendations for follow up following her last colonoscopy     To have right hip pain evaluated and treated       Care Coordination Interventions: Reviewed provider established plan for pain management Discussed importance of adherence to all scheduled medical appointments Counseled on the importance of reporting any/all new or changed pain symptoms or management strategies to pain management provider Advised patient to report to care team affect of pain on daily activities Reviewed with patient prescribed pharmacological and nonpharmacological pain relief strategies Reviewed and discussed MD follow up appointment with [facility] is scheduled for 04/06/23 Discussed plans with patient for ongoing care coordination follow up and provided patient with direct contact information for nurse care  coordinator Scheduled nurse follow up call for 04/10/23 @10 :30 AM           Our next appointment is by telephone on 04/10/23 at 10:30 AM  Please call the care guide team at [phone] if you need to cancel or reschedule your appointment.   If you are experiencing a Mental Health or Behavioral Health Crisis or need someone to talk to, please call 1-800-273-TALK (toll free, 24 hour hotline)  Patient verbalizes understanding of instructions and care plan provided today and agrees to view in MyChart. Active MyChart status and patient understanding of how to access instructions and care plan via MyChart confirmed with patient.     Delsa Sale RN BSN CCM [place]  [facility], [facility] Nurse Care Coordinator  Direct Dial: [phone] Website: [email or web address]

## 2023-03-27 NOTE — Patient Instructions (Addendum)
 It was a pleasure as always to see you today.  Continue follow-up with Dr. Myna Hidalgo.  Patient request referral to Dr. Linna Caprice regarding right hip pain.  She has a history of right hip arthroplasty which she performed years ago and is now having recurrent pain.  History of osteoporosis.  Would be a candidate for Prolia if insurance will pay for it.

## 2023-03-27 NOTE — Patient Outreach (Signed)
 Care Coordination   Follow Up Visit Note   03/27/2023 Name: Erika Murphy MRN: [id number] DOB: Mar 17, 1950  Erika Murphy is a 73 y.o. year old female who sees Baxley, Luanna Cole, MD for primary care. I spoke with  Gilmore Laroche by phone today.  What matters to the patients health and wellness today?  Patient would like to have her right hip pain evaluated and treated.     Goals Addressed             This Visit's Progress    COMPLETED: To have abdominal pain further evaluated and treated       Care Coordination Interventions: Evaluation of current treatment plan related to abdominal pain  and patient's adherence to plan as established by provider Reviewed and discussed with patient her recent follow up with Dr. Twanna Hy for evaluation of stomach cancer, determined patient's stomach Cancer remains to be in remission at this time  Assessed for nutritional status, discussed patient's daughter continues to help with meal prep and meal planning, patient is taking in adequate protein and maintaining her weight Educated patient about the importance of exercise, including light weight lifting to help build muscle mass, strength and endurance  Instructed patient to contact her doctor to report new symptoms or concerns promptly  Encouraged patient to contact Dr. Haywood Pao office to inquire about his recommendations for follow up following her last colonoscopy     To have right hip pain evaluated and treated       Care Coordination Interventions: Reviewed provider established plan for pain management Discussed importance of adherence to all scheduled medical appointments Counseled on the importance of reporting any/all new or changed pain symptoms or management strategies to pain management provider Advised patient to report to care team affect of pain on daily activities Reviewed with patient prescribed pharmacological and nonpharmacological pain relief strategies Reviewed and discussed MD follow up  appointment with [facility] is scheduled for 04/06/23 Discussed plans with patient for ongoing care coordination follow up and provided patient with direct contact information for nurse care coordinator Scheduled nurse follow up call for 04/10/23 @10 :30 AM       Interventions Today    Flowsheet Row Most Recent Value  Chronic Disease   Chronic disease during today's visit Other  [benign neoplasm of stomach,  abnormal weight loss,  right hip pain,  dysuria]  General Interventions   General Interventions Discussed/Reviewed General Interventions Discussed, General Interventions Reviewed, Doctor Visits  Doctor Visits Discussed/Reviewed Doctor Visits Discussed, Doctor Visits Reviewed, PCP, Specialist  Exercise Interventions   Exercise Discussed/Reviewed Physical Activity, Exercise Reviewed, Exercise Discussed  Physical Activity Discussed/Reviewed Physical Activity Discussed, Physical Activity Reviewed, Types of exercise  Education Interventions   Education Provided Provided Education  Provided Verbal Education On Nutrition, Exercise, Medication, When to see the doctor, Labs  Nutrition Interventions   Nutrition Discussed/Reviewed Nutrition Discussed, Nutrition Reviewed, Fluid intake  Pharmacy Interventions   Pharmacy Dicussed/Reviewed Pharmacy Topics Discussed, Pharmacy Topics Reviewed, Medications and their functions, Medication Adherence          SDOH assessments and interventions completed:  Yes  SDOH Interventions Today    Flowsheet Row Most Recent Value  SDOH Interventions   Food Insecurity Interventions Intervention Not Indicated  Housing Interventions Intervention Not Indicated  Utilities Interventions Intervention Not Indicated  Physical Activity Interventions Intervention Not Indicated        Care Coordination Interventions:  Yes, provided   Follow up plan: Follow up call scheduled for 04/10/23 @10 :30  AM    Encounter Outcome:  Patient Visit Completed

## 2023-03-28 ENCOUNTER — Encounter: Payer: Self-pay | Admitting: Internal Medicine

## 2023-04-06 DIAGNOSIS — M5410 Radiculopathy, site unspecified: Secondary | ICD-10-CM | POA: Diagnosis not present

## 2023-04-06 DIAGNOSIS — M25551 Pain in right hip: Secondary | ICD-10-CM | POA: Diagnosis not present

## 2023-04-06 DIAGNOSIS — Z96641 Presence of right artificial hip joint: Secondary | ICD-10-CM | POA: Diagnosis not present

## 2023-04-10 ENCOUNTER — Ambulatory Visit: Payer: Self-pay

## 2023-04-10 ENCOUNTER — Encounter: Payer: Self-pay | Admitting: Internal Medicine

## 2023-04-11 NOTE — Patient Outreach (Signed)
 Complex Care Management   Visit Note  04/11/2023  Name:  Erika Murphy MRN: [id number] DOB: 1950-01-29  Situation: Referral received for Complex Care Management related to  Multiple Sclerosis  I obtained verbal consent from patient.  Visit completed with patient  on the phone  Background:   Past Medical History:  Diagnosis Date   Anxiety    Arthritis    "right shoulder" (10/09/2013)   Asthma    pt denies   B12 deficiency anemia    pt denies   Fibromyalgia    GERD (gastroesophageal reflux disease)    History of blood transfusion 2001   "when I had my stomach cancer"   Hypertension    IBS (irritable bowel syndrome)    Iron deficiency anemia 12/11/2014   Malabsorption of iron 12/11/2014   MS (multiple sclerosis) (HCC) dx'd ~ 1998   Neurogenic bladder    due to MS   Osteopenia    Osteoporosis    PONV (postoperative nausea and vomiting)    Stomach cancer (HCC) 2001   Thyroid nodule    right    Assessment: Patient Reported Symptoms:  Cognitive    Neurological Numbness    HEENT Dryness    Cardiovascular No symptoms reported, Swelling in legs or feet    Respiratory No symptoms reported    Endocrine No symptoms reported    Gastrointestinal No symptoms reported    Genitourinary No symptoms reported    Integumentary No symptoms reported    Musculoskeletal Difficulty walking, Unsteady gait    Psychosocial No symptoms reported     There were no vitals filed for this visit.  Medications Reviewed Today     Reviewed by Riley Churches, RN (Registered Nurse) on 04/10/23 at 1050  Med List Status: <None>   Medication Order Taking? Sig Documenting Provider Last Dose Status Informant  albuterol (VENTOLIN HFA) 108 (90 Base) MCG/ACT inhaler [id number] Yes INHALE 2 PUFFS INTO THE LUNGS EVERY 6 HOURS AS NEEDED FOR SHORTNESS OF BREATH/WHEEZING Baxley, Luanna Cole, MD Taking Active Self  ALPRAZolam Prudy Feeler) 0.5 MG tablet [id number] Yes Take one half tab twice daily as needed for  anxiety.  Patient taking differently: Take 0.25-0.5 mg by mouth 2 (two) times daily as needed for anxiety. Take one half tab twice daily as needed for anxiety.   Margaree Mackintosh, MD Taking Active Self  amLODipine (NORVASC) 2.5 MG tablet [id number] Yes TAKE 1 TABLET BY MOUTH EVERY DAY Baxley, Luanna Cole, MD Taking Active   cholestyramine light (PREVALITE) 4 g packet [id number] Yes Take 4 g by mouth daily. Patient is taking as needed [provider] Taking Active Self  ciprofloxacin (CIPRO) 250 MG tablet [id number]  Take 1 tablet (250 mg total) by mouth 2 (two) times daily.  Patient not taking: Reported on 03/16/2023   Margaree Mackintosh, MD  Consider Medication Status and Discontinue (Completed Course)   cyanocobalamin (VITAMIN B12) 1000 MCG/ML injection [id number] Yes INJECT 1 ML (1,000 MCG TOTAL) INTO THE MUSCLE ONCE A MONTH AS DIRECTED Baxley, Luanna Cole, MD Taking Active Self  denosumab (PROLIA) 60 MG/ML SOSY injection [id number] Yes Inject 60 mg into the skin every 6 (six) months. [provider] Taking Active Self  esomeprazole (NEXIUM) 40 MG capsule [id number] Yes TAKE 1 CAPSULE BY MOUTH DAILY AT 12 NOON Baxley, Luanna Cole, MD Taking Active   meclizine (ANTIVERT) 25 MG tablet [id number] No Take 1 tablet (25 mg total) by mouth 3 (three) times daily as needed for dizziness.  Patient  not taking: Reported on 03/16/2023   Margaree Mackintosh, MD Not Taking Active Self  megestrol (MEGACE ES) 625 MG/5ML suspension [id number] No Take 5 mLs (625 mg total) by mouth daily.  Patient not taking: Reported on 03/16/2023   Josph Macho, MD Not Taking Active   meloxicam [facility]) 7.5 MG tablet [id number] No Take 1 tablet (7.5 mg total) by mouth daily.  Patient not taking: Reported on 03/16/2023   Margaree Mackintosh, MD Not Taking Active Self  methocarbamol (ROBAXIN) 500 MG tablet [id number] Yes Take 1 tablet (500 mg total) by mouth every 8 (eight) hours as needed for muscle spasms. Margaree Mackintosh, MD Taking Active   Multiple  Vitamins-Minerals (MULTIVITAMIN GUMMIES WOMENS PO) [id number] Yes Take 1 tablet by mouth daily. [provider] Taking Active Self  pregabalin (LYRICA) 50 MG capsule [id number] Yes TAKE 1 CAPSULE BY MOUTH THREE TIMES A DAY Baxley, Luanna Cole, MD Taking Active   rosuvastatin (CRESTOR) 5 MG tablet [id number] Yes TAKE 1 TABLET (5 MG TOTAL) BY MOUTH DAILY. Margaree Mackintosh, MD Taking Active Self            Recommendation:   Specialty provider follow-up Dr. Terrace Arabia with [facility]   Follow Up Plan:   Telephone follow up appointment date/time:  04/24/23 @1 :00 PM  Delsa Sale RN BSN CCM [place]  [facility], [facility] Nurse Care Coordinator  Direct Dial: [phone] Website: [name]

## 2023-04-11 NOTE — Patient Instructions (Signed)
 Visit Information  Thank you for taking time to visit with me today. Please don't hesitate to contact me if I can be of assistance to you before our next scheduled appointment.  Our next appointment is by telephone on 04/24/23 at 1:00 PM Please call the care guide team at [phone] if you need to cancel or reschedule your appointment.   Following is a copy of your care plan:   Goals Addressed             This Visit's Progress    COMPLETED: To have right hip pain evaluated and treated       Care Coordination Interventions: See new goal     VBCI RN Care Plan related to Multiple Sclerosis       Problems:  Chronic Disease Management support and education needs related to Multiple Sclerosis  Goal: Over the next 30 days the Patient will demonstrate ongoing self health care management ability to self-manage her MS as evidenced by    patient will schedule a follow up evaluation of her right leg numbness and pain with her Neurologist   Interventions:   Pain Interventions:  (Status:  New goal.) Short Term Goal Pain assessment performed Medications reviewed Reviewed provider established plan for pain management Discussed importance of adherence to all scheduled medical appointments Counseled on the importance of reporting any/all new or changed pain symptoms or management strategies to pain management provider Advised patient to report to care team affect of pain on daily activities Reviewed with patient prescribed pharmacological and nonpharmacological pain relief strategies Advised patient to discuss worsening symptoms of Sciatica pain and numbness to right leg with provider  Patient Self-Care Activities:  Attend all scheduled provider appointments Call pharmacy for medication refills 3-7 days in advance of running out of medications Call provider office for new concerns or questions  Take medications as prescribed    Plan:  Follow up with provider re: worsening Sciatica pain  and right leg numbness         Please call 1-800-273-TALK (toll free, 24 hour hotline) if you are experiencing a Mental Health or Behavioral Health Crisis or need someone to talk to.  Patient verbalizes understanding of instructions and care plan provided today and agrees to view in MyChart. Active MyChart status and patient understanding of how to access instructions and care plan via MyChart confirmed with patient.     Delsa Sale RN BSN CCM [place]  [facility], [facility] Nurse Care Coordinator  Direct Dial: [phone] Website: [email or web address]

## 2023-04-12 ENCOUNTER — Telehealth: Payer: Self-pay

## 2023-04-12 NOTE — Patient Outreach (Signed)
 Placed successful outbound call with patient to advise she will need to call Dr. Beryle Quant office to schedule an in person visit to evaluate for a Scooter and patient verbalizes understanding.   Delsa Sale RN BSN CCM [place]  [facility], [facility] Nurse Care Coordinator  Direct Dial: [phone] Website: [email or web address]

## 2023-04-16 ENCOUNTER — Other Ambulatory Visit: Payer: Self-pay | Admitting: *Deleted

## 2023-04-16 DIAGNOSIS — M810 Age-related osteoporosis without current pathological fracture: Secondary | ICD-10-CM

## 2023-04-16 MED ORDER — DENOSUMAB 60 MG/ML ~~LOC~~ SOSY
60.0000 mg | PREFILLED_SYRINGE | SUBCUTANEOUS | Status: AC
  Administered 2023-06-08: 60 mg via SUBCUTANEOUS

## 2023-04-24 ENCOUNTER — Telehealth: Payer: Self-pay

## 2023-04-24 ENCOUNTER — Ambulatory Visit: Payer: Self-pay

## 2023-04-24 DIAGNOSIS — G35 Multiple sclerosis: Secondary | ICD-10-CM

## 2023-04-24 NOTE — Patient Outreach (Signed)
 Complex Care Management   Visit Note  04/24/2023  Name:  Erika Murphy MRN: [id number] DOB: 17-Sep-1950  Situation: Referral received for Complex Care Management related to  Multiple Sclerosis  I obtained verbal consent from Patient.  Visit completed with patient  on the phone  Background:   Past Medical History:  Diagnosis Date   Anxiety    Arthritis    "right shoulder" (10/09/2013)   Asthma    pt denies   B12 deficiency anemia    pt denies   Fibromyalgia    GERD (gastroesophageal reflux disease)    History of blood transfusion 2001   "when I had my stomach cancer"   Hypertension    IBS (irritable bowel syndrome)    Iron  deficiency anemia 12/11/2014   Malabsorption of iron  12/11/2014   MS (multiple sclerosis) (HCC) dx'd ~ 1998   Neurogenic bladder    due to MS   Osteopenia    Osteoporosis    PONV (postoperative nausea and vomiting)    Stomach cancer (HCC) 2001   Thyroid  nodule    right    Assessment: Patient Reported Symptoms:  Cognitive Cognitive Status: Alert and oriented to person, place, and time Cognitive/Intellectual Conditions Management [RPT]: None reported or documented in medical history or problem list   Health Maintenance Behaviors: None Healing Pattern: Fast Health Facilitated by: Healthy diet, Prayer/meditation  Neurological Neurological Review of Symptoms: Numbness Neurological Conditions: Multiple sclerosis Neurological Management Strategies: Medication therapy, Routine screening (wears leggings to help keep right leg warm) Neurological Self-Management Outcome: 4 (good)  HEENT HEENT Symptoms Reported: No symptoms reported      Cardiovascular Cardiovascular Symptoms Reported: No symptoms reported Does patient have uncontrolled Hypertension?: No Cardiovascular Conditions: Hypertension Cardiovascular Management Strategies: Medication therapy, Routine screening Cardiovascular Self-Management Outcome: 4 (good)  Respiratory Respiratory Symptoms  Reported: No symptoms reported Respiratory Conditions: Seasonal allergies  Endocrine Patient reports the following symptoms related to hypoglycemia or hyperglycemia : No symptoms reported Is patient diabetic?: No    Gastrointestinal Gastrointestinal Symptoms Reported: No symptoms reported Gastrointestinal Conditions:  (s/p stomach cancer; malabsorption of iron ) Gastrointestinal Management Strategies: Medication therapy (routine screening) Gastrointestinal Self-Management Outcome: 4 (good) Nutrition Risk Screen (CP): No indicators present  Genitourinary Genitourinary Symptoms Reported: No symptoms reported    Integumentary Integumentary Symptoms Reported: No symptoms reported    Musculoskeletal Musculoskelatal Symptoms Reviewed: Difficulty walking, Muscle pain, Unsteady gait Additional Musculoskeletal Details: intermittent chronic right shoulder pain and right leg numbness related to MS Musculoskeletal Conditions: Mobility limited, Unsteady gait Other Musculoskeletal Conditions: Multiple Sclerosis Musculoskeletal Management Strategies: Routine screening (rest; uses cane and or walker for ambulation) Musculoskeletal Self-Management Outcome: 4 (good)   Patient at Risk for Falls Due to: History of fall(s), Impaired balance/gait, Impaired mobility Fall risk Follow up: Falls evaluation completed, Falls prevention discussed  Psychosocial Psychosocial Symptoms Reported: No symptoms reported   Major Change/Loss/Stressor/Fears (CP): Denies Quality of Family Relationships: supportive Do you feel physically threatened by others?: No      04/24/2023    1:32 PM  Depression screen PHQ 2/9  Decreased Interest 0  Down, Depressed, Hopeless 0  PHQ - 2 Score 0    There were no vitals filed for this visit.  Medications Reviewed Today     Reviewed by Kaylene Pascal, RN (Registered Nurse) on 04/24/23 at 1341  Med List Status: <None>   Medication Order Taking? Sig Documenting Provider Last Dose  Status Informant  albuterol  (VENTOLIN  HFA) 108 (90 Base) MCG/ACT inhaler [id number] No INHALE 2 PUFFS  INTO THE LUNGS EVERY 6 HOURS AS NEEDED FOR SHORTNESS OF BREATH/WHEEZING Sylvan Evener, MD Taking Active Self  ALPRAZolam  (XANAX ) 0.5 MG tablet [id number] No Take one half tab twice daily as needed for anxiety.  Patient taking differently: Take 0.25-0.5 mg by mouth 2 (two) times daily as needed for anxiety. Take one half tab twice daily as needed for anxiety.   Sylvan Evener, MD Taking Active Self  amLODipine  (NORVASC ) 2.5 MG tablet [id number] No TAKE 1 TABLET BY MOUTH EVERY DAY Baxley, Jaynie Meyers, MD Taking Active   cholestyramine light (PREVALITE) 4 g packet [id number] No Take 4 g by mouth daily. Patient is taking as needed [provider] Taking Active Self  ciprofloxacin  (CIPRO ) 250 MG tablet [id number] No Take 1 tablet (250 mg total) by mouth 2 (two) times daily.  Patient not taking: Reported on 03/16/2023   Sylvan Evener, MD Not Taking Active   cyanocobalamin  (VITAMIN B12) 1000 MCG/ML injection [id number] No INJECT 1 ML (1,000 MCG TOTAL) INTO THE MUSCLE ONCE A MONTH AS DIRECTED Baxley, Jaynie Meyers, MD Taking Active Self  denosumab  (PROLIA ) 60 MG/ML SOSY injection [id number] No Inject 60 mg into the skin every 6 (six) months. [provider] Taking Active Self  denosumab  (PROLIA ) injection 60 mg [id number]   Sylvan Evener, MD  Active   esomeprazole  (NEXIUM ) 40 MG capsule [id number] No TAKE 1 CAPSULE BY MOUTH DAILY AT 12 NOON Baxley, Jaynie Meyers, MD Taking Active   meclizine  (ANTIVERT ) 25 MG tablet [id number] No Take 1 tablet (25 mg total) by mouth 3 (three) times daily as needed for dizziness.  Patient not taking: Reported on 03/16/2023   Sylvan Evener, MD Not Taking Active Self  megestrol  (MEGACE  ES) 625 MG/5ML suspension [id number] No Take 5 mLs (625 mg total) by mouth daily.  Patient not taking: Reported on 03/16/2023   Ivor Mars, MD Not Taking Active   meloxicam  (MOBIC ) 7.5 MG tablet  [id number] No Take 1 tablet (7.5 mg total) by mouth daily.  Patient not taking: Reported on 03/16/2023   Sylvan Evener, MD Not Taking Active Self  methocarbamol  (ROBAXIN ) 500 MG tablet [id number] No Take 1 tablet (500 mg total) by mouth every 8 (eight) hours as needed for muscle spasms. Sylvan Evener, MD Taking Active   Multiple Vitamins-Minerals (MULTIVITAMIN GUMMIES WOMENS PO) [id number] No Take 1 tablet by mouth daily. [provider] Taking Active Self  pregabalin  (LYRICA ) 50 MG capsule [id number] No TAKE 1 CAPSULE BY MOUTH THREE TIMES A DAY Baxley, Jaynie Meyers, MD Taking Active   rosuvastatin  (CRESTOR ) 5 MG tablet [id number] No TAKE 1 TABLET (5 MG TOTAL) BY MOUTH DAILY. Sylvan Evener, MD Taking Active Self            Recommendation:   Specialty provider follow-up with Dr. Gracie Lav, Neurologist on 06/04/23 @1 :00 PM  Follow Up Plan:   Telephone follow up appointment date/time:  05/22/23 @2 :30 PM Referral to Care Guide to explore resources to help patient obtain air conditioning for her home   Louanne Roussel RN BSN CCM [facility]  [facility], [facility] Nurse Care Coordinator  Direct Dial: [phone] Website: [email or web address]

## 2023-04-24 NOTE — Addendum Note (Signed)
 Addended by: Kaylene Pascal on: 04/24/2023 02:07 PM   Modules accepted: Orders

## 2023-04-24 NOTE — Patient Instructions (Signed)
 Visit Information  Thank you for taking time to visit with me today. Please don't hesitate to contact me if I can be of assistance to you before our next scheduled appointment.  Your next care management appointment is by telephone on 05/22/23 at 2:30 PM  Referral to Care Guide to provide resources to help patient obtain air conditioning for her home   Please call the care guide team at [phone] if you need to cancel, schedule, or reschedule an appointment.   Please call 1-800-273-TALK (toll free, 24 hour hotline) if you are experiencing a Mental Health or Behavioral Health Crisis or need someone to talk to.  Louanne Roussel RN BSN CCM   [facility], [facility] Health Nurse Care Coordinator  Direct Dial: [phone] Website: [email or web address]

## 2023-04-26 ENCOUNTER — Telehealth: Payer: Self-pay | Admitting: Licensed Clinical Social Worker

## 2023-04-26 ENCOUNTER — Other Ambulatory Visit: Payer: Self-pay | Admitting: Internal Medicine

## 2023-04-26 DIAGNOSIS — Z1231 Encounter for screening mammogram for malignant neoplasm of breast: Secondary | ICD-10-CM

## 2023-04-27 ENCOUNTER — Other Ambulatory Visit: Payer: Self-pay

## 2023-04-27 NOTE — Patient Instructions (Signed)
 Visit Information  Thank you for taking time to visit with me today. Please don't hesitate to contact me if I can be of assistance to you before our next scheduled appointment.   Following is a copy of your care plan:   Goals Addressed             This Visit's Progress    VBCI Social Work Care Plan       Problems:   Patient does not have an Associate Professor unit in her rental.  CSW Clinical Goal(s):   Over the next 7 days the Patient will  speak to landlord and son to assist with obtaining a AC Unit .  Interventions:  SW recommended patient contact [employer] for additional options.  Patient Goals/Self-Care Activities:  Patient agreed to speak to landlord and son for additional assistance.  Plan:   No further follow up required: Patient does not require a follow up visit.        Please call 911 if you are experiencing a Mental Health or Behavioral Health Crisis or need someone to talk to.  Patient verbalizes understanding of instructions and care plan provided today and agrees to view in MyChart. Active MyChart status and patient understanding of how to access instructions and care plan via MyChart confirmed with patient.     Dallis Dues, BSW [place]  [facility], [facility] Social Worker Direct Dial: [phone]  Fax: [phone] Website: [email or web address]

## 2023-04-27 NOTE — Patient Outreach (Addendum)
 Complex Care Management   Visit Note  04/27/2023  Name:  Erika Murphy MRN: [id number] DOB: 1950-09-11  Situation: Referral received for Complex Care Management related to  obtain cooling unit  I obtained verbal consent from Patient.  Visit completed with patient  on the phone  Background:   Past Medical History:  Diagnosis Date   Anxiety    Arthritis    "right shoulder" (10/09/2013)   Asthma    pt denies   B12 deficiency anemia    pt denies   Fibromyalgia    GERD (gastroesophageal reflux disease)    History of blood transfusion 2001   "when I had my stomach cancer"   Hypertension    IBS (irritable bowel syndrome)    Iron  deficiency anemia 12/11/2014   Malabsorption of iron  12/11/2014   MS (multiple sclerosis) (HCC) dx'd ~ 1998   Neurogenic bladder    due to MS   Osteopenia    Osteoporosis    PONV (postoperative nausea and vomiting)    Stomach cancer (HCC) 2001   Thyroid  nodule    right    Assessment: Patient reports she rents but her unit does not have an A/C unit.  Patient has used DSS-CIP program in the past for her gas bill.  SW t/c DSS and informed that CIP funds are exhausted but can not be used to purchase an A/C.  SW directs patient to speak to [employer] for other options.  Patient plans to speak to landlord for options and son to assist with purchasing a window unit.  SW is unable to complete assessment as patient has not eaten and request to end call.  Recommendation:   No medical provider recommendations.  Follow Up Plan:   Patient does not require a follow up.  Dallis Dues, BSW [place]  [facility], [facility] Social Worker Direct Dial: [phone]  Fax: [phone] Website: [email or web address]

## 2023-05-04 ENCOUNTER — Inpatient Hospital Stay: Attending: Hematology & Oncology

## 2023-05-04 ENCOUNTER — Encounter: Payer: Self-pay | Admitting: Hematology & Oncology

## 2023-05-04 ENCOUNTER — Other Ambulatory Visit: Payer: Self-pay

## 2023-05-04 ENCOUNTER — Inpatient Hospital Stay (HOSPITAL_BASED_OUTPATIENT_CLINIC_OR_DEPARTMENT_OTHER): Admitting: Hematology & Oncology

## 2023-05-04 VITALS — BP 125/62 | HR 82 | Temp 97.8°F | Resp 16 | Ht 64.0 in | Wt 83.0 lb

## 2023-05-04 DIAGNOSIS — D500 Iron deficiency anemia secondary to blood loss (chronic): Secondary | ICD-10-CM

## 2023-05-04 DIAGNOSIS — K909 Intestinal malabsorption, unspecified: Secondary | ICD-10-CM

## 2023-05-04 DIAGNOSIS — E538 Deficiency of other specified B group vitamins: Secondary | ICD-10-CM

## 2023-05-04 DIAGNOSIS — D510 Vitamin B12 deficiency anemia due to intrinsic factor deficiency: Secondary | ICD-10-CM | POA: Diagnosis not present

## 2023-05-04 DIAGNOSIS — Z85028 Personal history of other malignant neoplasm of stomach: Secondary | ICD-10-CM | POA: Diagnosis not present

## 2023-05-04 DIAGNOSIS — D002 Carcinoma in situ of stomach: Secondary | ICD-10-CM | POA: Diagnosis not present

## 2023-05-04 DIAGNOSIS — D509 Iron deficiency anemia, unspecified: Secondary | ICD-10-CM | POA: Diagnosis not present

## 2023-05-04 DIAGNOSIS — G35 Multiple sclerosis: Secondary | ICD-10-CM | POA: Insufficient documentation

## 2023-05-04 LAB — CMP (CANCER CENTER ONLY)
ALT: 54 U/L — ABNORMAL HIGH (ref 0–44)
AST: 59 U/L — ABNORMAL HIGH (ref 15–41)
Albumin: 4.5 g/dL (ref 3.5–5.0)
Alkaline Phosphatase: 79 U/L (ref 38–126)
Anion gap: 7 (ref 5–15)
BUN: 21 mg/dL (ref 8–23)
CO2: 31 mmol/L (ref 22–32)
Calcium: 10.1 mg/dL (ref 8.9–10.3)
Chloride: 104 mmol/L (ref 98–111)
Creatinine: 1.24 mg/dL — ABNORMAL HIGH (ref 0.44–1.00)
GFR, Estimated: 46 mL/min — ABNORMAL LOW (ref >60)
Glucose, Bld: 113 mg/dL — ABNORMAL HIGH (ref 70–99)
Potassium: 4.5 mmol/L (ref 3.5–5.1)
Sodium: 142 mmol/L (ref 135–145)
Total Bilirubin: 0.6 mg/dL (ref 0.0–1.2)
Total Protein: 8.1 g/dL (ref 6.5–8.1)

## 2023-05-04 LAB — CBC WITH DIFFERENTIAL (CANCER CENTER ONLY)
Abs Immature Granulocytes: 0.03 10*3/uL (ref 0.00–0.07)
Basophils Absolute: 0 10*3/uL (ref 0.0–0.1)
Basophils Relative: 1 %
Eosinophils Absolute: 0.1 10*3/uL (ref 0.0–0.5)
Eosinophils Relative: 2 %
HCT: 38.3 % (ref 36.0–46.0)
Hemoglobin: 12.3 g/dL (ref 12.0–15.0)
Immature Granulocytes: 1 %
Lymphocytes Relative: 22 %
Lymphs Abs: 0.9 10*3/uL (ref 0.7–4.0)
MCH: 30.6 pg (ref 26.0–34.0)
MCHC: 32.1 g/dL (ref 30.0–36.0)
MCV: 95.3 fL (ref 80.0–100.0)
Monocytes Absolute: 0.4 10*3/uL (ref 0.1–1.0)
Monocytes Relative: 10 %
Neutro Abs: 2.5 10*3/uL (ref 1.7–7.7)
Neutrophils Relative %: 64 %
Platelet Count: 157 10*3/uL (ref 150–400)
RBC: 4.02 MIL/uL (ref 3.87–5.11)
RDW: 12 % (ref 11.5–15.5)
WBC Count: 3.9 10*3/uL — ABNORMAL LOW (ref 4.0–10.5)
nRBC: 0 % (ref 0.0–0.2)

## 2023-05-04 LAB — IRON AND IRON BINDING CAPACITY (CC-WL,HP ONLY)
Iron: 114 ug/dL (ref 28–170)
Saturation Ratios: 29 % (ref 10.4–31.8)
TIBC: 392 ug/dL (ref 250–450)
UIBC: 278 ug/dL (ref 148–442)

## 2023-05-04 LAB — RETICULOCYTES
Immature Retic Fract: 6.6 % (ref 2.3–15.9)
RBC.: 3.98 MIL/uL (ref 3.87–5.11)
Retic Count, Absolute: 39.8 10*3/uL (ref 19.0–186.0)
Retic Ct Pct: 1 % (ref 0.4–3.1)

## 2023-05-04 LAB — VITAMIN B12: Vitamin B-12: 320 pg/mL (ref 180–914)

## 2023-05-04 LAB — FERRITIN: Ferritin: 176 ng/mL (ref 11–307)

## 2023-05-04 NOTE — Progress Notes (Signed)
 Hematology and Oncology Follow Up Visit  Erika Murphy [id number] September 18, 1950 73 y.o. 05/04/2023   Principle Diagnosis:  Stage II (T3N0M0) adenocarcinoma of the stomach-remission Recurrent iron  deficiency anemia  Pernicious anemia Multiple sclerosis Current Therapy:   IV iron  as indicated -- Venofer  was last given on 07/21/2022 Vitamin B-12 1 mg IM every month --done at home Prolia  60 mg IM q 6 months -- next dose in 05/2023 at primary care provider's office     Interim History:  Ms.  Diggs returns for follow-up.  She is managing okay.  Her weight is down just a couple pounds.  She has had no problems with abdominal pain.  There is no nausea or vomiting..  She is having some back issues.  She may have little bit of sciatica.  I am not sure if the back issue might be related to the multiple sclerosis that she has.  She has had no weakness in her legs.  Has been no headache.  Has been no double vision.  Her last iron  studies that we did back in March showed a ferritin of 98 with an iron  saturation of 33%.  Her last vitamin B12 level was 400 pg/mL.  She has had no fever.  There has been no bleeding.  Overall, I would have to say that her performance status is probably ECOG 2.  Medications:  Current Outpatient Medications:    albuterol  (VENTOLIN  HFA) 108 (90 Base) MCG/ACT inhaler, INHALE 2 PUFFS INTO THE LUNGS EVERY 6 HOURS AS NEEDED FOR SHORTNESS OF BREATH/WHEEZING, Disp: 8.5 each, Rfl: 11   ALPRAZolam  (XANAX ) 0.5 MG tablet, Take one half tab twice daily as needed for anxiety. (Patient taking differently: Take 0.25-0.5 mg by mouth 2 (two) times daily as needed for anxiety. Take one half tab twice daily as needed for anxiety.), Disp: 60 tablet, Rfl: 1   amLODipine  (NORVASC ) 2.5 MG tablet, TAKE 1 TABLET BY MOUTH EVERY DAY, Disp: 90 tablet, Rfl: 3   cholestyramine light (PREVALITE) 4 g packet, Take 4 g by mouth daily. Patient is taking as needed, Disp: , Rfl:    ciprofloxacin  (CIPRO ) 250  MG tablet, Take 1 tablet (250 mg total) by mouth 2 (two) times daily. (Patient not taking: Reported on 03/16/2023), Disp: 14 tablet, Rfl: 0   cyanocobalamin  (VITAMIN B12) 1000 MCG/ML injection, INJECT 1 ML (1,000 MCG TOTAL) INTO THE MUSCLE ONCE A MONTH AS DIRECTED, Disp: 3 mL, Rfl: 3   denosumab  (PROLIA ) 60 MG/ML SOSY injection, Inject 60 mg into the skin every 6 (six) months., Disp: , Rfl:    esomeprazole  (NEXIUM ) 40 MG capsule, TAKE 1 CAPSULE BY MOUTH DAILY AT 12 NOON, Disp: 90 capsule, Rfl: 3   meclizine  (ANTIVERT ) 25 MG tablet, Take 1 tablet (25 mg total) by mouth 3 (three) times daily as needed for dizziness. (Patient not taking: Reported on 03/16/2023), Disp: 30 tablet, Rfl: 0   megestrol  (MEGACE  ES) 625 MG/5ML suspension, Take 5 mLs (625 mg total) by mouth daily. (Patient not taking: Reported on 03/16/2023), Disp: 150 mL, Rfl: 5   meloxicam  (MOBIC ) 7.5 MG tablet, Take 1 tablet (7.5 mg total) by mouth daily. (Patient not taking: Reported on 03/16/2023), Disp: 12 tablet, Rfl: 0   methocarbamol  (ROBAXIN ) 500 MG tablet, Take 1 tablet (500 mg total) by mouth every 8 (eight) hours as needed for muscle spasms., Disp: 60 tablet, Rfl: 1   Multiple Vitamins-Minerals (MULTIVITAMIN GUMMIES WOMENS PO), Take 1 tablet by mouth daily., Disp: , Rfl:    pregabalin  (  LYRICA ) 50 MG capsule, TAKE 1 CAPSULE BY MOUTH THREE TIMES A DAY, Disp: 90 capsule, Rfl: 1   rosuvastatin  (CRESTOR ) 5 MG tablet, TAKE 1 TABLET (5 MG TOTAL) BY MOUTH DAILY., Disp: 90 tablet, Rfl: 3   Sod Picosulfate-Mag Ox-Cit Acd (CLENPIQ) 10-3.5-12 MG-GM -GM/175ML SOLN, 175 ML ORALLY TWICE 2, Disp: , Rfl:   Current Facility-Administered Medications:    [START ON 05/14/2023] denosumab  (PROLIA ) injection 60 mg, 60 mg, Subcutaneous, Q6 months, Baxley, Jaynie Meyers, MD  Allergies:  Allergies  Allergen Reactions   Grass Pollen(K-O-R-T-Swt Vern) Cough    Past Medical History, Surgical history, Social history, and Family History were reviewed and  updated.  Review of Systems: Review of Systems  Constitutional:  Positive for malaise/fatigue.  HENT:  Positive for tinnitus.   Eyes:  Positive for blurred vision.  Respiratory: Negative.    Cardiovascular: Negative.   Gastrointestinal:  Positive for constipation. Negative for diarrhea.  Genitourinary: Negative.   Musculoskeletal:  Positive for myalgias.  Skin: Negative.   Neurological:  Positive for tingling and weakness.  Endo/Heme/Allergies: Negative.   Psychiatric/Behavioral: Negative.      Physical Exam:  height is 5\' 4"  (1.626 m) and weight is 83 lb (37.6 kg). Her oral temperature is 97.8 F (36.6 C). Her blood pressure is 125/62 and her pulse is 82. Her respiration is 16 and oxygen  saturation is 100%.   Physical Exam Vitals reviewed.  Constitutional:      Comments: Very thin African-American female.  She does have some temporal muscle wasting.  There is no adenopathy.  There is no rash.  She has had no palpable liver or spleen.  HENT:     Head: Normocephalic and atraumatic.  Eyes:     Pupils: Pupils are equal, round, and reactive to light.  Cardiovascular:     Rate and Rhythm: Normal rate and regular rhythm.     Heart sounds: Normal heart sounds.  Pulmonary:     Effort: Pulmonary effort is normal.     Breath sounds: Normal breath sounds.  Abdominal:     General: Bowel sounds are normal.     Palpations: Abdomen is soft.  Musculoskeletal:        General: No tenderness or deformity. Normal range of motion.     Cervical back: Normal range of motion.     Comments: She has symmetric muscle atrophy in upper and lower extremities.  She has marked limited range of motion of the right shoulder.  Lymphadenopathy:     Cervical: No cervical adenopathy.  Skin:    General: Skin is warm and dry.     Findings: No erythema or rash.  Neurological:     Mental Status: She is alert and oriented to person, place, and time.  Psychiatric:        Behavior: Behavior normal.         Thought Content: Thought content normal.        Judgment: Judgment normal.      Lab Results  Component Value Date   WBC 3.9 (L) 05/04/2023   HGB 12.3 05/04/2023   HCT 38.3 05/04/2023   MCV 95.3 05/04/2023   PLT 157 05/04/2023     Chemistry      Component Value Date/Time   NA 142 05/04/2023 1135   NA 144 06/12/2016 1151   NA 142 06/11/2015 1156   K 4.5 05/04/2023 1135   K 4.7 06/12/2016 1151   K 4.3 06/11/2015 1156   CL 104 05/04/2023 1135  CL 107 06/12/2016 1151   CO2 31 05/04/2023 1135   CO2 32 06/12/2016 1151   CO2 29 06/11/2015 1156   BUN 21 05/04/2023 1135   BUN 11 06/12/2016 1151   BUN 13.5 06/11/2015 1156   CREATININE 1.24 (H) 05/04/2023 1135   CREATININE 0.95 03/09/2023 1202   CREATININE 1.2 (H) 06/11/2015 1156      Component Value Date/Time   CALCIUM  10.1 05/04/2023 1135   CALCIUM  9.6 06/12/2016 1151   CALCIUM  9.7 06/11/2015 1156   ALKPHOS 79 05/04/2023 1135   ALKPHOS 97 (H) 06/12/2016 1151   ALKPHOS 111 06/11/2015 1156   AST 59 (H) 05/04/2023 1135   AST 30 06/11/2015 1156   ALT 54 (H) 05/04/2023 1135   ALT 26 06/12/2016 1151   ALT 23 06/11/2015 1156   BILITOT 0.6 05/04/2023 1135   BILITOT 1.03 06/11/2015 1156      Impression and Plan: Ms. Reich is a 73 year old African [place] female. She had a history of stage II stomach cancer. She underwent resection. She had adjuvant therapy.  She  is now over 23 years out from treatment. She is cured of the stomach cancer.   I noted that her renal function is a little bit down.  I told her to make sure she drinks enough fluid and to watch the salt intake.  We will see what her iron  studies look like.   I will plan to get her back in about 6 weeks.  Hopefully, her weight will go up a little bit more.  Ivor Mars, MD 5/2/20251:10 PM

## 2023-05-11 ENCOUNTER — Encounter (HOSPITAL_COMMUNITY): Payer: Self-pay

## 2023-05-12 ENCOUNTER — Other Ambulatory Visit: Payer: Self-pay | Admitting: Internal Medicine

## 2023-05-17 ENCOUNTER — Other Ambulatory Visit (HOSPITAL_COMMUNITY): Payer: Self-pay

## 2023-05-17 ENCOUNTER — Telehealth: Payer: Self-pay

## 2023-05-17 NOTE — Telephone Encounter (Signed)
 Erika Murphy

## 2023-05-17 NOTE — Telephone Encounter (Signed)
 Prolia VOB initiated via [email or web address]  Next Prolia inj DUE: 05/24/23

## 2023-05-17 NOTE — Telephone Encounter (Signed)
 Pt ready for scheduling for PROLIA  on or after : 05/24/23  Option# 1: Buy/Bill (Office supplied medication)  Out-of-pocket cost due at time of clinic visit: $332  Number of injection/visits approved: 2  Primary: [employer] Prolia  co-insurance: 20% Admin fee co-insurance: 0%  Secondary: --- Prolia  co-insurance:  Admin fee co-insurance:   Medical Benefit Details: Date Benefits were checked: 05/17/23 Deductible: NO/ Coinsurance: 20%/ Admin Fee: 0%  Prior Auth: APPROVED PA# [id number] Expiration Date: 01/03/23-01/02/24  # of doses approved: 2 ----------------------------------------------------------------------- Option# 2- Med Obtained from pharmacy:  Pharmacy benefit: Copay $0 (Paid to pharmacy) Admin Fee: 0% (Pay at clinic)  Prior Auth: N/A PA# Expiration Date:   # of doses approved:   If patient wants fill through the pharmacy benefit please send prescription to: [facility], and include estimated need by date in rx notes. Pharmacy will ship medication directly to the office.  Patient NOT eligible for Prolia  Copay Card. Copay Card can make patient's cost as little as $25. Link to apply: [email or web address]://www.amgensupportplus.[email or web address]/copay  ** This summary of benefits is an estimation of the patient's out-of-pocket cost. Exact cost may very based on individual plan coverage.

## 2023-05-18 ENCOUNTER — Ambulatory Visit
Admission: RE | Admit: 2023-05-18 | Discharge: 2023-05-18 | Disposition: A | Discharge status: Home / Self Care | Source: Ambulatory Visit | Attending: Internal Medicine | Admitting: Internal Medicine

## 2023-05-18 DIAGNOSIS — Z1231 Encounter for screening mammogram for malignant neoplasm of breast: Secondary | ICD-10-CM

## 2023-05-22 ENCOUNTER — Other Ambulatory Visit: Payer: Self-pay | Admitting: *Deleted

## 2023-05-22 ENCOUNTER — Other Ambulatory Visit: Payer: Self-pay

## 2023-05-22 DIAGNOSIS — M810 Age-related osteoporosis without current pathological fracture: Secondary | ICD-10-CM

## 2023-05-22 MED ORDER — DENOSUMAB 60 MG/ML ~~LOC~~ SOSY: 60.0000 mg | PREFILLED_SYRINGE | SUBCUTANEOUS | 0 refills | Status: DC

## 2023-05-22 NOTE — Patient Outreach (Signed)
 Complex Care Management   Visit Note  05/22/2023  Name:  Erika Murphy MRN: [id number] DOB: 06-21-1950  Situation: Referral received for Complex Care Management related to Multiple Sclerosis, low body weight, s/p stomach Cancer, Osteoporosis, elevated liver enzymes. I obtained verbal consent from Patient.  Visit completed with patient on the phone.  Background:   Past Medical History:  Diagnosis Date   Anxiety    Arthritis    "right shoulder" (10/09/2013)   Asthma    pt denies   B12 deficiency anemia    pt denies   Fibromyalgia    GERD (gastroesophageal reflux disease)    History of blood transfusion 2001   "when I had my stomach cancer"   Hypertension    IBS (irritable bowel syndrome)    Iron  deficiency anemia 12/11/2014   Malabsorption of iron  12/11/2014   MS (multiple sclerosis) (HCC) dx'd ~ 1998   Neurogenic bladder    due to MS   Osteopenia    Osteoporosis    PONV (postoperative nausea and vomiting)    Stomach cancer (HCC) 2001   Thyroid  nodule    right    Assessment: Patient Reported Symptoms:  Cognitive Cognitive Status: Alert and oriented to person, place, and time Cognitive/Intellectual Conditions Management [RPT]: None reported or documented in medical history or problem list   Health Maintenance Behaviors: Annual physical exam, Immunizations, Healthy diet, Social activities Health Facilitated by: Healthy diet, Rest, Prayer/meditation, Pain control  Neurological Neurological Review of Symptoms: Numbness Neurological Conditions: Multiple sclerosis Neurological Management Strategies: Medication therapy, Routine screening Neurological Self-Management Outcome: 3 (uncertain) Neurological Comment: Upcoming scheduled follow up with Dr. Gracie Lav, Neurologist is scheduled for 06/04/23  HEENT HEENT Symptoms Reported: No symptoms reported      Cardiovascular Cardiovascular Symptoms Reported: Not assessed    Respiratory Respiratory Symptoms Reported: Not assesed     Endocrine Patient reports the following symptoms related to hypoglycemia or hyperglycemia : No symptoms reported Is patient diabetic?: No Endocrine Conditions: Osteoporosis Endocrine Management Strategies: Medication therapy, Routine screening Endocrine Self-Management Outcome: 4 (good)  Gastrointestinal Gastrointestinal Symptoms Reported: Other Other Gastrointestinal Symptoms: low body weight Gastrointestinal Conditions: Other Other Gastrointestinal Conditions: s/p stomach Cancer Gastrointestinal Management Strategies:  (routine screening) Gastrointestinal Self-Management Outcome: 4 (good) Nutrition Risk Screen (CP): Reduced oral intake over the last month (patient states she eats what she likes, she has maintained low weight since having stomach Cancer)  Genitourinary Genitourinary Symptoms Reported: No symptoms reported Genitourinary Conditions: Chronic kidney disease Genitourinary Management Strategies: Fluid modification Genitourinary Self-Management Outcome: 3 (uncertain) Genitourinary Comment: Educated patient to aim for drinking 48-64 oz of water  daily unless otherwise directed  Integumentary Integumentary Symptoms Reported: Not assessed    Musculoskeletal Musculoskelatal Symptoms Reviewed: Not assessed        Psychosocial Psychosocial Symptoms Reported: Not assessed     Quality of Family Relationships: involved, helpful, supportive      04/24/2023    1:32 PM  Depression screen PHQ 2/9  Decreased Interest 0  Down, Depressed, Hopeless 0  PHQ - 2 Score 0    There were no vitals filed for this visit.  Medications Reviewed Today     Reviewed by Kaylene Pascal, RN (Registered Nurse) on 05/22/23 at 1447  Med List Status: <None>   Medication Order Taking? Sig Documenting Provider Last Dose Status Informant  albuterol  (VENTOLIN  HFA) 108 (90 Base) MCG/ACT inhaler [id number] Yes INHALE 2 PUFFS INTO THE LUNGS EVERY 6 HOURS AS NEEDED FOR SHORTNESS OF BREATH/WHEEZING Baxley,  Jaynie Meyers, MD  Taking Active Self  ALPRAZolam  (XANAX ) 0.5 MG tablet [id number] Yes Take one half tab twice daily as needed for anxiety.  Patient taking differently: Take 0.25-0.5 mg by mouth 2 (two) times daily as needed for anxiety. Take one half tab twice daily as needed for anxiety.   Sylvan Evener, MD Taking Active Self  amLODipine  (NORVASC ) 2.5 MG tablet [id number] Yes TAKE 1 TABLET BY MOUTH EVERY DAY Baxley, Jaynie Meyers, MD Taking Active   cholestyramine light (PREVALITE) 4 g packet [id number] Yes Take 4 g by mouth daily. Patient is taking as needed [provider] Taking Active Self  ciprofloxacin  (CIPRO ) 250 MG tablet [id number]  Take 1 tablet (250 mg total) by mouth 2 (two) times daily.  Patient not taking: Reported on 03/16/2023   Sylvan Evener, MD  Consider Medication Status and Discontinue (Completed Course)   cyanocobalamin  (VITAMIN B12) 1000 MCG/ML injection [id number] Yes INJECT 1 ML (1,000 MCG TOTAL) INTO THE MUSCLE ONCE A MONTH AS DIRECTED Baxley, Jaynie Meyers, MD Taking Active Self  denosumab  (PROLIA ) 60 MG/ML SOSY injection [id number] Yes Inject 60 mg into the skin every 6 (six) months. Dx code: m81.0.  pt has appt on 05/29/23 Sylvan Evener, MD Taking Active   denosumab  (PROLIA ) injection 60 mg [id number]   Sylvan Evener, MD  Active   esomeprazole  (NEXIUM ) 40 MG capsule [id number] Yes TAKE 1 CAPSULE BY MOUTH DAILY AT 12 NOON Baxley, Jaynie Meyers, MD Taking Active   meclizine  (ANTIVERT ) 25 MG tablet [id number] No Take 1 tablet (25 mg total) by mouth 3 (three) times daily as needed for dizziness.  Patient not taking: Reported on 03/16/2023   Sylvan Evener, MD Not Taking Active Self  megestrol  (MEGACE  ES) 625 MG/5ML suspension [id number] No Take 5 mLs (625 mg total) by mouth daily.  Patient not taking: Reported on 05/22/2023   Ivor Mars, MD Not Taking Active   meloxicam  (MOBIC ) 7.5 MG tablet [id number] No Take 1 tablet (7.5 mg total) by mouth daily.  Patient not taking: Reported on 03/16/2023    Sylvan Evener, MD Not Taking Active Self  methocarbamol  (ROBAXIN ) 500 MG tablet [id number] Yes TAKE 1 TABLET BY MOUTH EVERY 8 HOURS AS NEEDED FOR MUSCLE SPASMS. Sylvan Evener, MD Taking Active   Multiple Vitamins-Minerals (MULTIVITAMIN GUMMIES WOMENS PO) [id number] Yes Take 1 tablet by mouth daily. [provider] Taking Active Self  pregabalin  (LYRICA ) 50 MG capsule [id number] Yes TAKE 1 CAPSULE BY MOUTH THREE TIMES A DAY Baxley, Jaynie Meyers, MD Taking Active   rosuvastatin  (CRESTOR ) 5 MG tablet [id number] Yes TAKE 1 TABLET (5 MG TOTAL) BY MOUTH DAILY. Sylvan Evener, MD Taking Active Self  Sod Picosulfate-Mag Ox-Cit Acd [facility]) 10-3.5-12 MG-GM Garey Jung [id number] Yes 175 ML ORALLY TWICE 2 [provider] Taking Active             Recommendation:   Specialty provider follow-up Dr. Gracie Lav, Neurologist on 06/04/23 at 1 PM; Follow up with Dr. Bolling Bushy, Oncologist on 06/22/23 at 9:15 AM for labs; 9:30 AM with Dr. Bolling Bushy  Follow Up Plan:   Telephone follow up appointment date/time:  June 30 at 09:00 AM  Louanne Roussel RN BSN CCM [facility]  [facility], [facility] Nurse Care Coordinator  Direct Dial: [phone] Website: [email or web address]

## 2023-05-22 NOTE — Patient Instructions (Signed)
 Visit Information  Thank you for taking time to visit with me today. Please don't hesitate to contact me if I can be of assistance to you before our next scheduled appointment.  Your next care management appointment is by telephone on 07/02/23 at 09:00 AM  Please call the care guide team at [phone] if you need to cancel, schedule, or reschedule an appointment.   Please call [phone]-TALK (toll free, 24 hour hotline) if you are experiencing a Mental Health or Behavioral Health Crisis or need someone to talk to.  Louanne Roussel RN BSN CCM [place]  [facility], [facility] Nurse Care Coordinator  Direct Dial: [phone] Website: [email or web address]

## 2023-05-24 ENCOUNTER — Other Ambulatory Visit: Payer: Self-pay

## 2023-05-24 ENCOUNTER — Other Ambulatory Visit (HOSPITAL_COMMUNITY): Payer: Self-pay

## 2023-05-24 ENCOUNTER — Other Ambulatory Visit: Payer: Self-pay | Admitting: Internal Medicine

## 2023-05-24 MED ORDER — DENOSUMAB 60 MG/ML ~~LOC~~ SOSY
60.0000 mg | PREFILLED_SYRINGE | SUBCUTANEOUS | 0 refills | Status: DC
  Filled 2023-05-24 (×2): qty 1, 180d supply, fill #0

## 2023-05-24 NOTE — Addendum Note (Signed)
 Addended by: Marylou Sobers D on: 05/24/2023 01:22 PM   Modules accepted: Orders

## 2023-05-24 NOTE — Progress Notes (Signed)
 Pharmacy Patient Advocate Encounter  Insurance verification completed.   The patient is insured through [facility]   Ran test claim for Prolia . Co-pay is $0.  This test claim was processed through [facility]- copay amounts may vary at other pharmacies due to pharmacy/plan contracts, or as the patient moves through the different stages of their insurance plan.

## 2023-05-24 NOTE — Progress Notes (Signed)
 Specialty Pharmacy Initial Fill Coordination Note  Erika Murphy is a 73 y.o. female contacted today regarding initial fill of specialty medication(s) Denosumab  (PROLIA )   Patient requested Courier to Provider Office   Delivery date: 05/29/23   Verified address: [facility]   Medication will be filled on 5/23.   Patient is aware of $0 copayment.    Office is closed on Friday and Monday for Holiday. Spoke with CMA, Araceli and advised that we are unable to send this until 5/27, which is the day of pt's appointment (11:30), and there is no guarantee that it will arrive on time. They are unable to reschedule appt. I was told to send anyway.

## 2023-05-25 ENCOUNTER — Other Ambulatory Visit: Payer: Self-pay

## 2023-05-25 IMAGING — MG MM DIGITAL SCREENING BILAT W/ TOMO AND CAD
8 series · 9 of 24 positions shown · non-contrast
Comparison: Previous exam(s).

CLINICAL DATA: Screening.

EXAM:
DIGITAL SCREENING BILATERAL MAMMOGRAM WITH TOMOSYNTHESIS AND CAD
TECHNIQUE: Bilateral screening digital craniocaudal and mediolateral oblique
mammograms were obtained. Bilateral screening digital breast
tomosynthesis was performed. The images were evaluated with
computer-aided detection.

[L CC synth-2D]
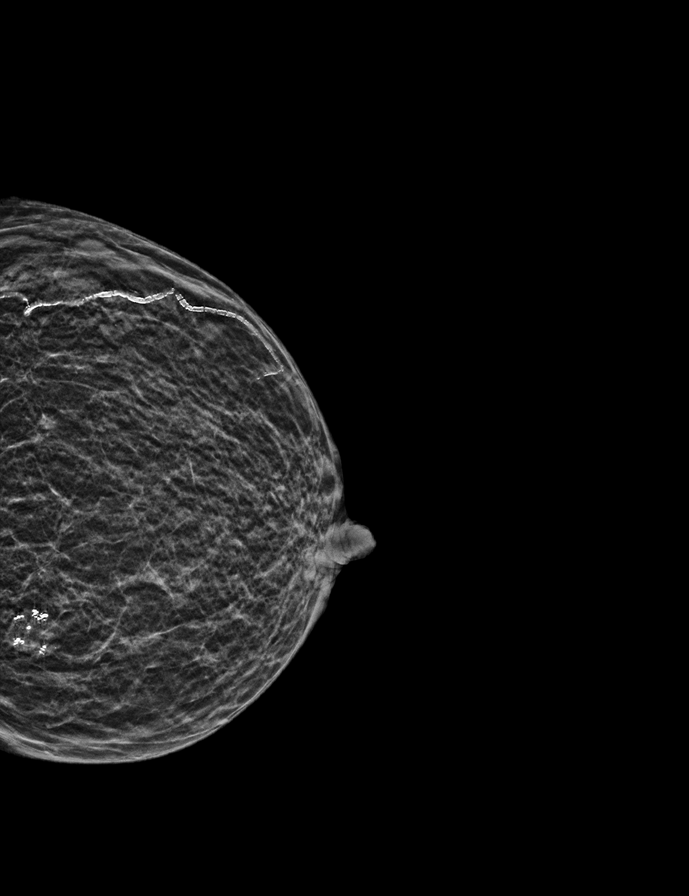

[R MLO synth-2D]
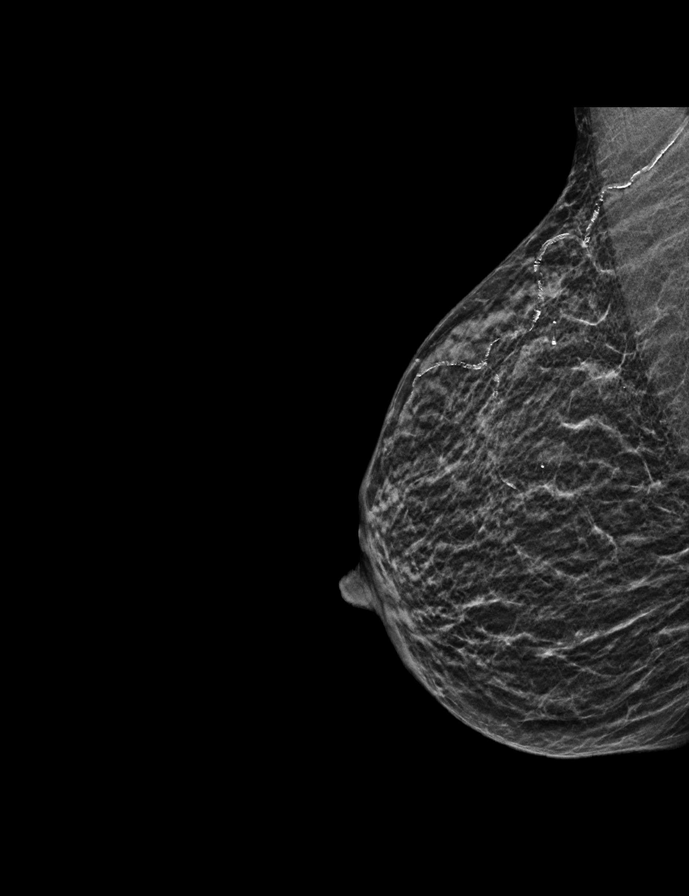

[L MLO synth-2D]
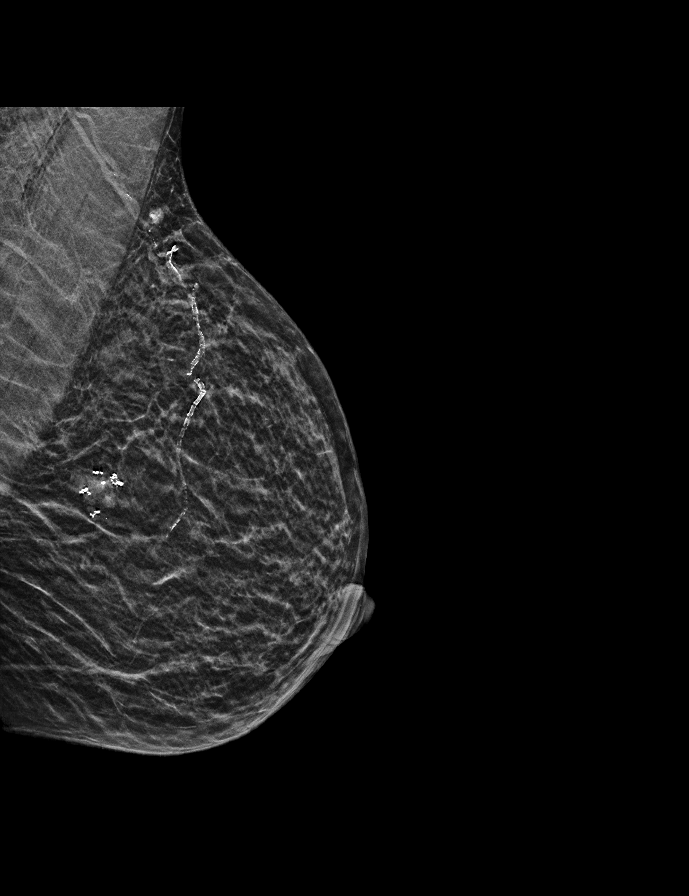

[R CC synth-2D]
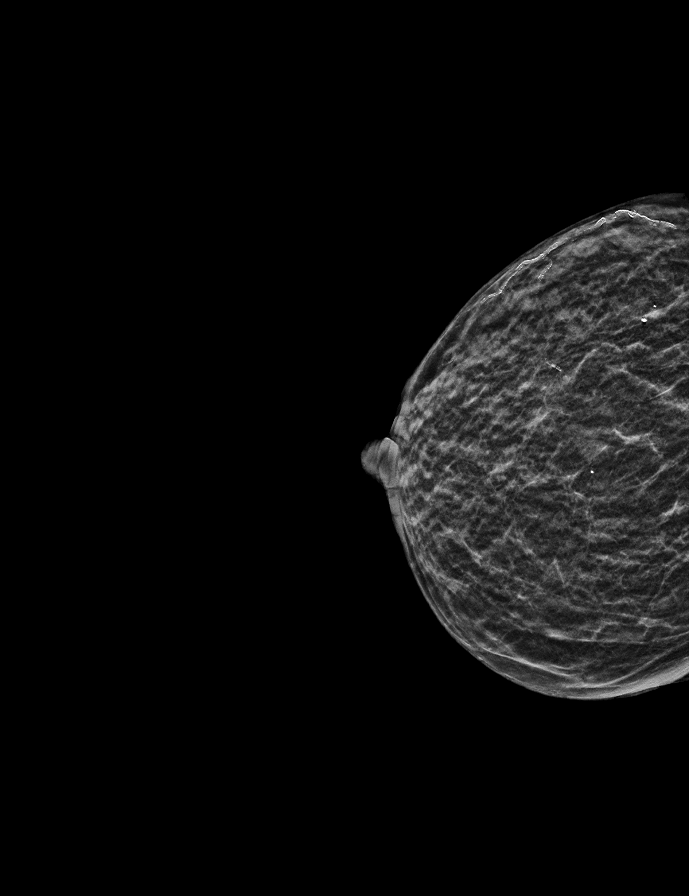

[L CC tomo · 2 of 18 frames shown]
[frame 6/18]
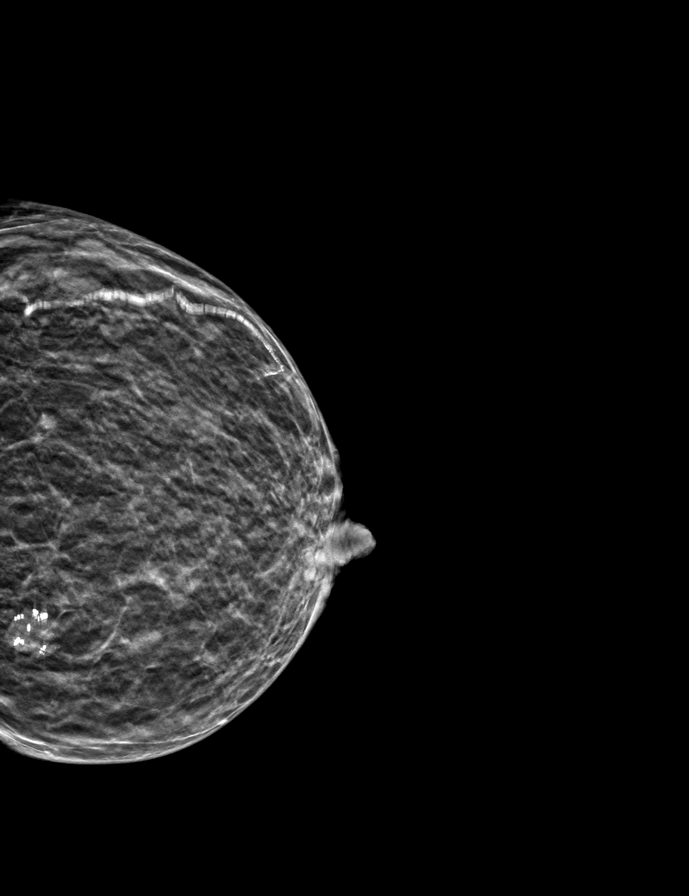
[frame 9/18]
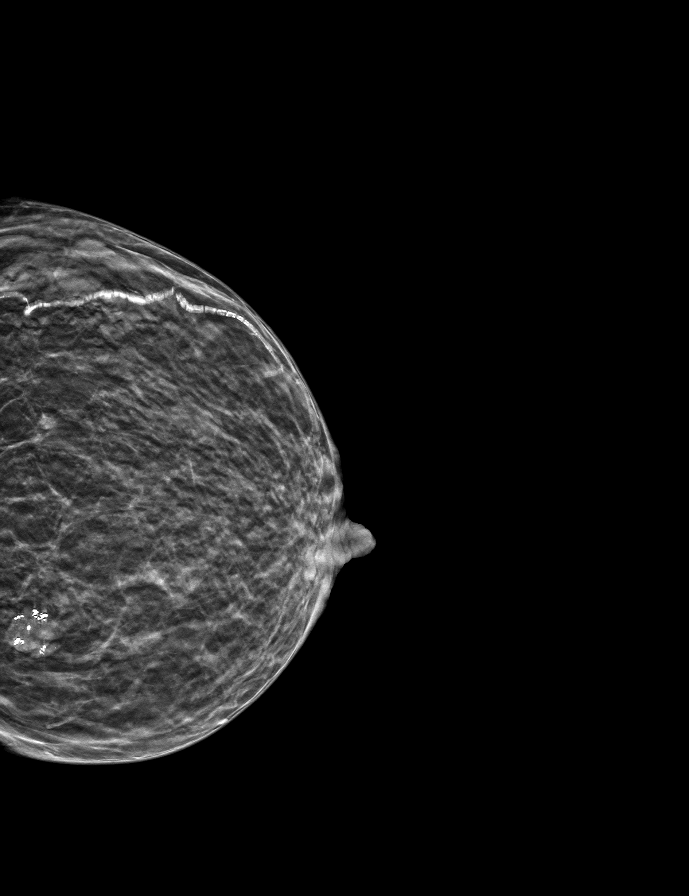

[R CC tomo · tomo slice 11/21.0]
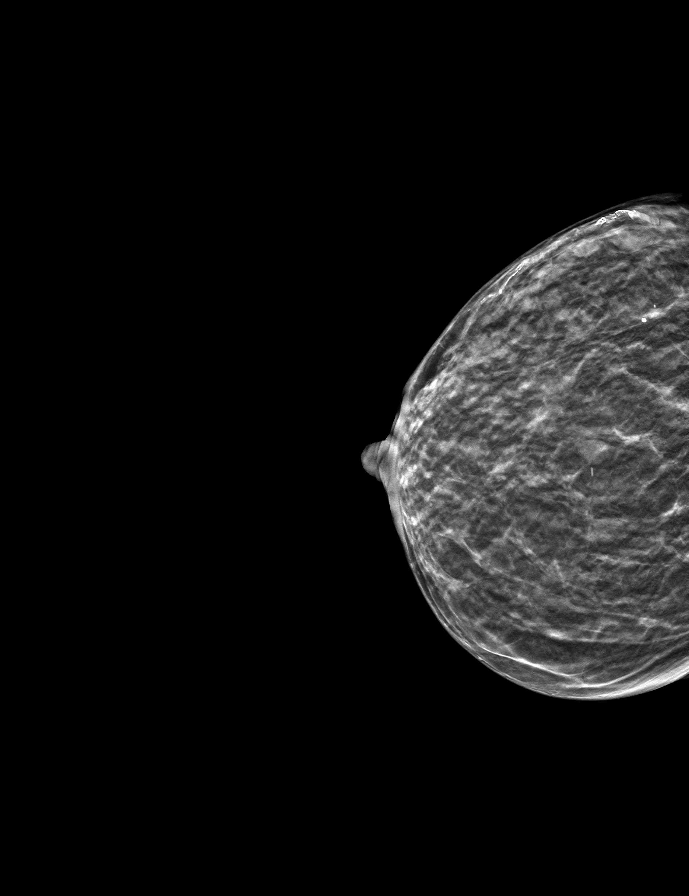

[L MLO tomo · tomo slice 11/22.0]
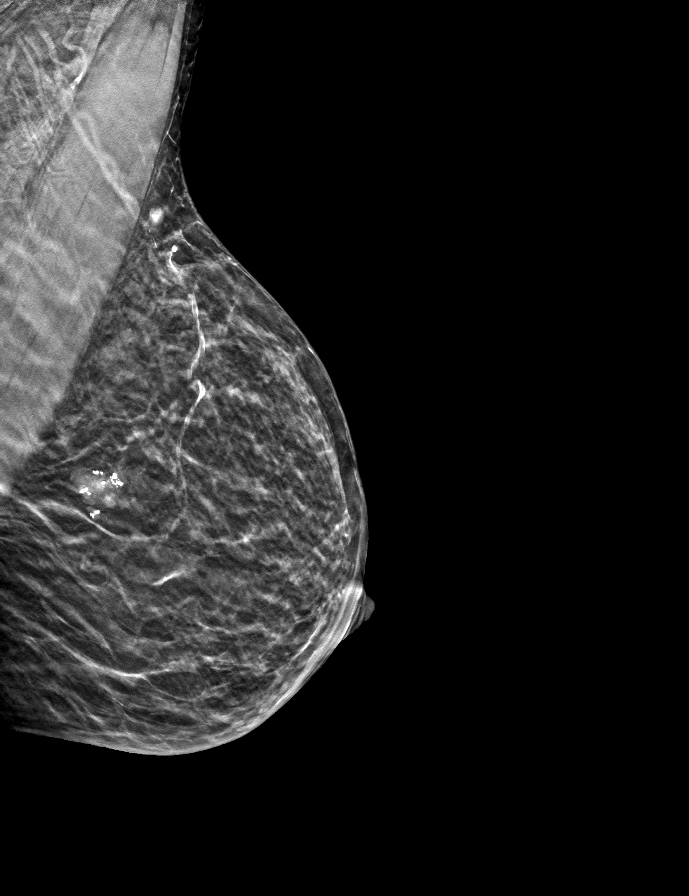

[R MLO tomo · tomo slice 10/19.0]
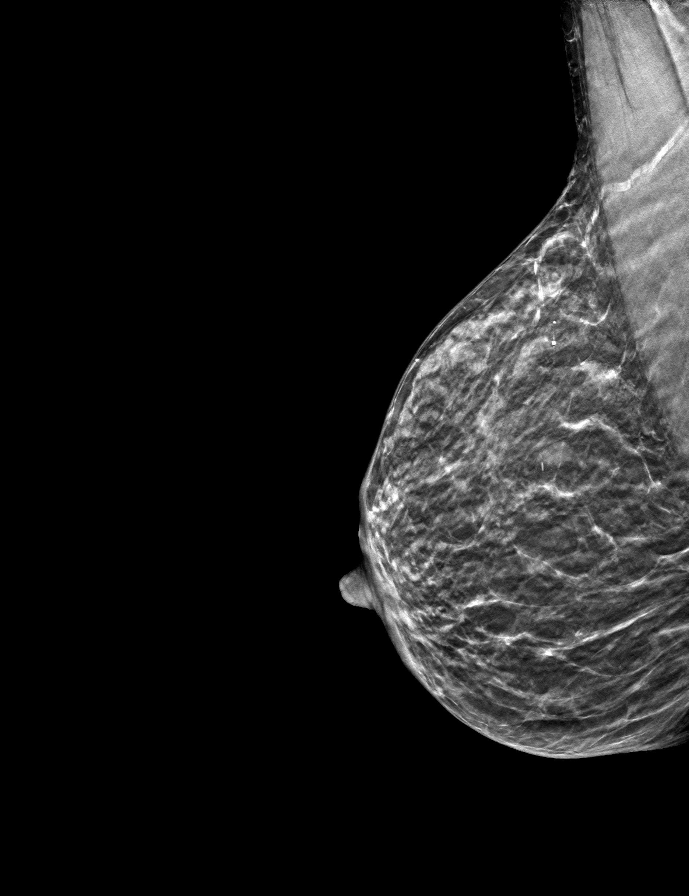

[9 of 24 positions shown; findings below may reference images not displayed]

ACR Breast Density Category c: The breast tissue is heterogeneously
dense, which may obscure small masses.
FINDINGS: There are no findings suspicious for malignancy.
IMPRESSION: No mammographic evidence of malignancy. A result letter of this
screening mammogram will be mailed directly to the patient.

RECOMMENDATION:
Screening mammogram in one year. (Code:Q3-W-BC3)

BI-RADS CATEGORY  1: Negative.

## 2023-05-29 ENCOUNTER — Ambulatory Visit: Payer: Self-pay | Admitting: Internal Medicine

## 2023-05-29 ENCOUNTER — Ambulatory Visit (INDEPENDENT_AMBULATORY_CARE_PROVIDER_SITE_OTHER): Admitting: Internal Medicine

## 2023-05-29 ENCOUNTER — Ambulatory Visit

## 2023-05-29 ENCOUNTER — Ambulatory Visit
Admission: RE | Admit: 2023-05-29 | Discharge: 2023-05-29 | Disposition: A | Discharge status: Home / Self Care | Source: Ambulatory Visit | Attending: Internal Medicine | Admitting: Internal Medicine

## 2023-05-29 ENCOUNTER — Encounter: Payer: Self-pay | Admitting: Internal Medicine

## 2023-05-29 VITALS — BP 130/80 | HR 76 | Ht 65.0 in | Wt 84.0 lb

## 2023-05-29 DIAGNOSIS — R748 Abnormal levels of other serum enzymes: Secondary | ICD-10-CM | POA: Diagnosis not present

## 2023-05-29 DIAGNOSIS — M25551 Pain in right hip: Secondary | ICD-10-CM | POA: Diagnosis not present

## 2023-05-29 DIAGNOSIS — Z96641 Presence of right artificial hip joint: Secondary | ICD-10-CM | POA: Diagnosis not present

## 2023-05-29 DIAGNOSIS — M1611 Unilateral primary osteoarthritis, right hip: Secondary | ICD-10-CM | POA: Diagnosis not present

## 2023-05-29 LAB — COMPLETE METABOLIC PANEL WITHOUT GFR
AG Ratio: 1.3 (calc) (ref 1.0–2.5)
ALT: 25 U/L (ref 6–29)
AST: 32 U/L (ref 10–35)
Albumin: 4 g/dL (ref 3.6–5.1)
Alkaline phosphatase (APISO): 71 U/L (ref 37–153)
BUN/Creatinine Ratio: 18 (calc) (ref 6–22)
BUN: 19 mg/dL (ref 7–25)
CO2: 29 mmol/L (ref 20–32)
Calcium: 9.6 mg/dL (ref 8.6–10.4)
Chloride: 102 mmol/L (ref 98–110)
Creat: 1.07 mg/dL — ABNORMAL HIGH (ref 0.60–1.00)
Globulin: 3.1 g/dL (ref 1.9–3.7)
Glucose, Bld: 80 mg/dL (ref 65–99)
Potassium: 4.1 mmol/L (ref 3.5–5.3)
Sodium: 140 mmol/L (ref 135–146)
Total Bilirubin: 0.7 mg/dL (ref 0.2–1.2)
Total Protein: 7.1 g/dL (ref 6.1–8.1)

## 2023-05-29 LAB — GAMMA GT: GGT: 21 U/L (ref 3–65)

## 2023-05-29 NOTE — Progress Notes (Signed)
 Patient Care Team: Sylvan Evener, MD as PCP - General (Internal Medicine) Maria Shiner Sherryll Donald, MD as Consulting Physician (Oncology) Nathen Balder Skeeter Dukes, RN as [facility] Care Management  Visit Date: 05/29/23  Subjective:   Chief Complaint  Patient presents with   Elevated Hepatic Enzymes  Patient WU:JWJX Erika Murphy,Female DOB:[birth year],72 y.o. BJY:[id number]   73 y.o.Female presents today for visit with Elevated LFTs. Patient has a past medical history of Multiple Sclerosis. 05/04/2023 AST 59 and ALT 54. Denies excessive Aleve, Tylenol , or Advil use - only uses Tylenol  every couple of months if needed; also denies drinking. Says that she uses her Pregabalin  50 mg as needed, mostly at night and Robaxin  500 mg as needed.   On physical exam she mentions pain in her right hip causing her difficulty sleeping and pain on palpation. Denies recent fall or injury.  Past Medical History:  Diagnosis Date   Anxiety    Arthritis    "right shoulder" (10/09/2013)   Asthma    pt denies   B12 deficiency anemia    pt denies   Fibromyalgia    GERD (gastroesophageal reflux disease)    History of blood transfusion 2001   "when I had my stomach cancer"   Hypertension    IBS (irritable bowel syndrome)    Iron  deficiency anemia 12/11/2014   Malabsorption of iron  12/11/2014   MS (multiple sclerosis) (HCC) dx'd ~ 1998   Neurogenic bladder    due to MS   Osteopenia    Osteoporosis    PONV (postoperative nausea and vomiting)    Stomach cancer (HCC) 2001   Thyroid  nodule    right    Allergies  Allergen Reactions   Grass Pollen(Erika-O-R-T-Swt Vern) Cough    Family History  Problem Relation Age of Onset   Heart disease Mother    Diabetes Father    Stomach cancer Brother    Multiple sclerosis Other    Breast cancer Sister        2 sisters breast cancer    Breast cancer Maternal Aunt    Social History   Social History Narrative   Patient is retired.    Patient is married with 3 children.   Patient  drinks caffeine daily.    Review of Systems  Musculoskeletal:  Positive for joint pain (right hip).  All other systems reviewed and are negative.    Objective:  Vitals: BP 130/80   Pulse 76   Ht 5\' 5"  (1.651 m)   Wt 84 lb (38.1 kg)   SpO2 99%   BMI 13.98 kg/m   Physical Exam Vitals and nursing note reviewed.  Constitutional:      General: She is not in acute distress.    Appearance: Normal appearance. She is not toxic-appearing.  HENT:     Head: Normocephalic and atraumatic.  Pulmonary:     Effort: Pulmonary effort is normal.  Abdominal:     Palpations: There is no hepatomegaly.  Musculoskeletal:     Right hip: Bony tenderness (on palpation) present. Normal range of motion.     Comments: No pain in hip on flexion, extension, or rotation of right leg  Skin:    General: Skin is warm and dry.  Neurological:     Mental Status: She is alert and oriented to person, place, and time. Mental status is at baseline.  Psychiatric:        Mood and Affect: Mood normal.        Behavior: Behavior normal.  Thought Content: Thought content normal.        Judgment: Judgment normal.     Results:  Studies Obtained And Personally Reviewed By Me:  CT ABDOMEN AND PELVIS WITH CONTRAST 08/15/2022   COMPARISON:  Abdominal radiographs 02/27/2022. CT abdomen and pelvis 02/13/2020   FINDINGS: Lower chest: There is a focal area of poorly defined perivascular tree-in-bud infiltration in the right lower lung. This may represent early acute pneumonia. Heart is enlarged.   Hepatobiliary: No focal liver abnormality is seen. No gallstones, gallbladder wall thickening, or biliary dilatation.   Pancreas: Unremarkable. No pancreatic ductal dilatation or surrounding inflammatory changes.   Spleen: Normal in size without focal abnormality.   Adrenals/Urinary Tract: Adrenal glands are unremarkable. Kidneys are normal, without renal calculi, focal lesion, or hydronephrosis. Bladder is  unremarkable.   Stomach/Bowel: Stomach is decompressed. Surgical clips in the upper abdomen. Small bowel and colon are decompressed with scattered stool in the colon. No definitive evidence of wall thickening or inflammatory change. Appendix is not identified.   Vascular/Lymphatic: Aortic atherosclerosis. No enlarged abdominal or pelvic lymph nodes.   Reproductive: Visualization of the low pelvis is limited by streak artifact from right hip arthroplasty. No pelvic masses identified.   Other: No free air or free fluid in the abdomen. Abdominal wall musculature appears intact.   Musculoskeletal: Degenerative changes. No destructive bone lesions. Right hip arthroplasty.   IMPRESSION: 1. No evidence suggesting bowel obstruction or inflammation. Bowel are mostly decompressed, limiting evaluation.  2. Small focal area of infiltration demonstrated in the right lung base possibly representing early pneumonia.  3. Cardiac enlargement.  4. Aortic atherosclerosis.   Labs:     Component Value Date/Time   NA 142 05/04/2023 1135   NA 144 06/12/2016 1151   NA 142 06/11/2015 1156   Erika 4.5 05/04/2023 1135   Erika 4.7 06/12/2016 1151   Erika 4.3 06/11/2015 1156   CL 104 05/04/2023 1135   CL 107 06/12/2016 1151   CO2 31 05/04/2023 1135   CO2 32 06/12/2016 1151   CO2 29 06/11/2015 1156   GLUCOSE 113 (H) 05/04/2023 1135   GLUCOSE 88 06/12/2016 1151   BUN 21 05/04/2023 1135   BUN 11 06/12/2016 1151   BUN 13.5 06/11/2015 1156   CREATININE 1.24 (H) 05/04/2023 1135   CREATININE 0.95 03/09/2023 1202   CREATININE 1.2 (H) 06/11/2015 1156   CALCIUM  10.1 05/04/2023 1135   CALCIUM  9.6 06/12/2016 1151   CALCIUM  9.7 06/11/2015 1156   PROT 8.1 05/04/2023 1135   PROT 7.5 06/12/2016 1151   PROT 7.5 06/11/2015 1156   ALBUMIN 4.5 05/04/2023 1135   ALBUMIN 3.8 06/12/2016 1151   ALBUMIN 3.8 06/11/2015 1156   AST 59 (H) 05/04/2023 1135   AST 30 06/11/2015 1156   ALT 54 (H) 05/04/2023 1135   ALT 26  06/12/2016 1151   ALT 23 06/11/2015 1156   ALKPHOS 79 05/04/2023 1135   ALKPHOS 97 (H) 06/12/2016 1151   ALKPHOS 111 06/11/2015 1156   BILITOT 0.6 05/04/2023 1135   BILITOT 1.03 06/11/2015 1156   GFRNONAA 46 (L) 05/04/2023 1135   GFRNONAA 73 01/16/2020 0949   GFRAA 85 01/16/2020 0949    Lab Results  Component Value Date   WBC 3.9 (L) 05/04/2023   HGB 12.3 05/04/2023   HCT 38.3 05/04/2023   MCV 95.3 05/04/2023   PLT 157 05/04/2023   Lab Results  Component Value Date   CHOL 189 03/09/2023   HDL 70 03/09/2023   LDLCALC 99  03/09/2023   TRIG 100 03/09/2023   CHOLHDL 2.7 03/09/2023   Lab Results  Component Value Date   HGBA1C 5.0 05/13/2021    Lab Results  Component Value Date   TSH 0.62 03/09/2023   Assessment & Plan:   Orders Placed This Encounter  Procedures   Gamma GT   COMPLETE METABOLIC PANEL WITHOUT GFR   Elevated LFTs: on 05/04/2023 AST 59 and ALT 54. Denies excessive Aleve, Tylenol , or Advil use - only uses Tylenol  every couple of months if needed; also denies drinking. Says that she uses her Pregabalin  50 mg as needed, mostly at night and Robaxin  500 mg as needed.   Right Hip Pain causing her difficulty sleeping and pain on palpation. Denies recent fall or injury.      I,Emily Lagle,acting as a Neurosurgeon for Sylvan Evener, MD.,have documented all relevant documentation on the behalf of Sylvan Evener, MD,as directed by  Sylvan Evener, MD while in the presence of Sylvan Evener, MD.   ***

## 2023-05-29 NOTE — Progress Notes (Unsigned)
 Received Prolia  today per CMA for osteoporosis. Next dose due in 6 months.

## 2023-05-30 ENCOUNTER — Encounter: Payer: Self-pay | Admitting: Internal Medicine

## 2023-05-30 ENCOUNTER — Ambulatory Visit: Payer: Self-pay | Admitting: Internal Medicine

## 2023-05-30 NOTE — Patient Instructions (Signed)
 See Dr. Carlye Child regarding right hip pain. Your liver functions are completely normal at this time.

## 2023-06-04 ENCOUNTER — Other Ambulatory Visit: Payer: Self-pay

## 2023-06-04 ENCOUNTER — Telehealth: Payer: Self-pay

## 2023-06-04 ENCOUNTER — Other Ambulatory Visit: Payer: Self-pay | Admitting: *Deleted

## 2023-06-04 ENCOUNTER — Other Ambulatory Visit (HOSPITAL_COMMUNITY): Payer: Self-pay

## 2023-06-04 ENCOUNTER — Ambulatory Visit: Admitting: Neurology

## 2023-06-04 ENCOUNTER — Telehealth: Payer: Self-pay | Admitting: *Deleted

## 2023-06-04 ENCOUNTER — Encounter: Payer: Self-pay | Admitting: Neurology

## 2023-06-04 VITALS — BP 127/62 | HR 79 | Resp 17 | Ht 64.0 in | Wt 83.0 lb

## 2023-06-04 DIAGNOSIS — G8929 Other chronic pain: Secondary | ICD-10-CM | POA: Diagnosis not present

## 2023-06-04 DIAGNOSIS — M5441 Lumbago with sciatica, right side: Secondary | ICD-10-CM | POA: Insufficient documentation

## 2023-06-04 DIAGNOSIS — R269 Unspecified abnormalities of gait and mobility: Secondary | ICD-10-CM

## 2023-06-04 DIAGNOSIS — R634 Abnormal weight loss: Secondary | ICD-10-CM

## 2023-06-04 DIAGNOSIS — M79671 Pain in right foot: Secondary | ICD-10-CM | POA: Diagnosis not present

## 2023-06-04 DIAGNOSIS — G35 Multiple sclerosis: Secondary | ICD-10-CM

## 2023-06-04 NOTE — Telephone Encounter (Signed)
 Can you book her for a prolia  injection? Any day is fine, thanks.   Copied from CRM [id number]. Topic: Clinical - Medication Question >> Jun 04, 2023  3:15 PM Ethelle Herb L wrote: Reason for CRM: Pt spoke to Noelle(nurse w/ [employer]) and advised she had an appointment w/ Dr. Liane Redman on 05/29/2023. Pt thought she was going to get her prolia  injection at visit, Noelle's records show prolia  injection was delivered to office on 05/29/23.   Noelle requesting patient be reached out to for prolia  injection

## 2023-06-04 NOTE — Telephone Encounter (Signed)
 Patient is scheduled

## 2023-06-04 NOTE — Progress Notes (Signed)
 Chief Complaint  Patient presents with   Multiple Sclerosis    Rm14, alone, MS f/u: pt reported hip pain (right), arm pain(right), left leg pain as well. Pt has right foot drop, denied vision issues w/glasses and urinary issues.       ASSESSMENT AND PLAN  Erika Murphy is a 73 y.o. female   Multiple sclerosis  Worsening gait abnormality Low back pain radiating pain to right hip, right lower extremity, right foot pain    Diagnosis since 1998,  Stable, MRI of the brain October 2023 showed no significant supratentorium involvement, evidence of chronic C5-6 lesion,  History of gastric cancer, status postgastrectomy, has not been on long-term immunomodulation therapy for many years, no clinical flareup  Recurrent gait abnormality likely combination of right hip pain, history of right hip replacement, right foot pain, low back pain, less likely MS flareup,  Proceed with MRI of lumbar spine  X-ray of the right foot to rule out foot pathology  Home physical therapy  DIAGNOSTIC DATA (LABS, IMAGING, TESTING) - I reviewed patient records, labs, notes, testing and imaging myself where available.   MEDICAL HISTORY:  Erika Murphy is a 73 year old female, following up for multiple sclerosis, she was seen by Dr. Dania Dupre in the past, her primary care physician is Dr. Liane Redman, Adriana Hopping      I reviewed and summarized the referring note. PMHX. HTN History of Gastric cancer, s/p gastrectomy,  Right shoulder reverse replacement, still has limited range of motion  She was diagnosed with multiple sclerosis since 1998, presented with spastic gait disorder, Betaseron from 1998-2011, was stopped due to abnormal liver functional test, which did recover after stopping progesterone,  Symptoms from multiple sclerosis has been fairly stable, mainly presenting with gait abnormality, some neuropathic pain, is taking Lyrica  50 mg 3 times a day,  Multiple repeat MRIs cervical and brain scan of the past many years  showed no significant change  She did have a history of right hip replacement, gastric cancer required partial gastrectomy, has not been on any long-term immunomodulation therapy for her MS  She was seen by Isa Manuel after lost follow-up for few years for intermittent right parietal area headaches, transient, only few seconds, but severe, there is no significant worsening of her functional status, able to ambulate with a cane, no bowel or bladder incontinence, complains of right shoulder pain, limited range of motion despite right reverse shoulder replacement  Personally reviewed MRI of the brain with without contrast October 2023, few periventricular subcortical nonspecific T2 hyperintensity  MRI of cervical spine, chronic demyelinating plaque anteriorly and centrally at C5-6 level, mild degenerative changes Laboratory evaluation in January 2024, normal lipid panel, TSH, CMP, CBC showed mild anemia hemoglobin of 11.5, leukocytosis WBC of 2.5,  UPDATE June 2nd 2025: She has right shoulder reverse replacement in May 2023, since then she has limited movement of right shoulder,  still have significant right shoulder pain, run down her arm, also complains of worsening gait abnormality, rely on her cane, walker, worsening right hip pain, history of right hip replacement.  She lives alone, never driven,  She also complains of low back pain, running down her right hip, lower extremity, right foot pain, concerned about her gait abnormality   PHYSICAL EXAM:   Vitals:   06/04/23 1301  BP: 127/62  Pulse: 79  Resp: 17  SpO2: 98%  Weight: 83 lb (37.6 kg)  Height: 5\' 4"  (1.626 m)   Body mass index is 14.25 kg/m.  PHYSICAL  EXAMNIATION:  Gen: NAD, conversant, well nourised, well groomed                     Cardiovascular: Regular rate rhythm, no peripheral edema, warm, nontender. Eyes: Conjunctivae clear without exudates or hemorrhage Neck: Supple, no carotid bruits. Pulmonary: Clear to  auscultation bilaterally   NEUROLOGICAL EXAM:  MENTAL STATUS: Speech/cognition: Very thin, colorful hair, awake, alert, oriented to history taking and casual conversation CRANIAL NERVES: CN II: Visual fields are full to confrontation. Pupils are round equal and briskly reactive to light. CN III, IV, VI: extraocular movement are normal. No ptosis. CN V: Facial sensation is intact to light touch CN VII: Face is symmetric with normal eye closure  CN VIII: Hearing is normal to causal conversation. CN IX, X: Phonation is normal. CN XI: Head turning and shoulder shrug are intact  MOTOR: Limited range of motion of right shoulder, moderate lower extremity spasticity, mild to moderate hip flexion weakness, also limited by her right hip pain, significant tenderness at the top of right foot, may have mild right ankle dorsiflexion weakness  REFLEXES: Reflexes are 2+ and symmetric at the biceps, triceps, 3/3 knees, and trace at ankles.  Extensor bilaterally, significant tenderness at the top of right foot upon deep palpitation  SENSORY: Intact to light touch, pinprick and vibratory sensation are intact in fingers and toes.  COORDINATION: There is no trunk or limb dysmetria noted.  GAIT/STANCE: Need push-up to get up from seated position, rely on her cane, spastic cautious unsteady gait, dragging right leg.   REVIEW OF SYSTEMS:  Full 14 system review of systems performed and notable only for as above All other review of systems were negative.   ALLERGIES: Allergies  Allergen Reactions   Grass Pollen(K-O-R-T-Swt Vern) Cough    HOME MEDICATIONS: Current Outpatient Medications  Medication Sig Dispense Refill   acetaminophen  (TYLENOL ) 500 MG tablet Take 500 mg by mouth every 6 (six) hours as needed for mild pain (pain score 1-3).     albuterol  (VENTOLIN  HFA) 108 (90 Base) MCG/ACT inhaler INHALE 2 PUFFS INTO THE LUNGS EVERY 6 HOURS AS NEEDED FOR SHORTNESS OF BREATH/WHEEZING 8.5 each 11    ALPRAZolam  (XANAX ) 0.5 MG tablet Take one half tab twice daily as needed for anxiety. (Patient taking differently: Take 0.25-0.5 mg by mouth 2 (two) times daily as needed for anxiety. Take one half tab twice daily as needed for anxiety.) 60 tablet 1   amLODipine  (NORVASC ) 2.5 MG tablet TAKE 1 TABLET BY MOUTH EVERY DAY 90 tablet 3   cholestyramine light (PREVALITE) 4 g packet Take 4 g by mouth daily. Patient is taking as needed     cyanocobalamin  (VITAMIN B12) 1000 MCG/ML injection INJECT 1 ML (1,000 MCG TOTAL) INTO THE MUSCLE ONCE A MONTH AS DIRECTED 3 mL 3   denosumab  (PROLIA ) 60 MG/ML SOSY injection Inject 60 mg into the skin every 6 (six) months. Dx code: m81.0.  pt has appt on 05/29/23 1 mL 0   esomeprazole  (NEXIUM ) 40 MG capsule TAKE 1 CAPSULE BY MOUTH DAILY AT 12 NOON 90 capsule 3   meclizine  (ANTIVERT ) 25 MG tablet Take 1 tablet (25 mg total) by mouth 3 (three) times daily as needed for dizziness. 30 tablet 0   methocarbamol  (ROBAXIN ) 500 MG tablet TAKE 1 TABLET BY MOUTH EVERY 8 HOURS AS NEEDED FOR MUSCLE SPASMS. 60 tablet 1   Multiple Vitamins-Minerals (MULTIVITAMIN GUMMIES WOMENS PO) Take 1 tablet by mouth daily.     pregabalin  (  LYRICA ) 50 MG capsule TAKE 1 CAPSULE BY MOUTH THREE TIMES A DAY 90 capsule 1   rosuvastatin  (CRESTOR ) 5 MG tablet TAKE 1 TABLET (5 MG TOTAL) BY MOUTH DAILY. 90 tablet 3   Current Facility-Administered Medications  Medication Dose Route Frequency Provider Last Rate Last Admin   denosumab  (PROLIA ) injection 60 mg  60 mg Subcutaneous Q6 months Sylvan Evener, MD        PAST MEDICAL HISTORY: Past Medical History:  Diagnosis Date   Anxiety    Arthritis    "right shoulder" (10/09/2013)   Asthma    pt denies   B12 deficiency anemia    pt denies   Fibromyalgia    GERD (gastroesophageal reflux disease)    History of blood transfusion 2001   "when I had my stomach cancer"   Hypertension    IBS (irritable bowel syndrome)    Iron  deficiency anemia 12/11/2014    Malabsorption of iron  12/11/2014   MS (multiple sclerosis) (HCC) dx'd ~ 1998   Neurogenic bladder    due to MS   Osteopenia    Osteoporosis    PONV (postoperative nausea and vomiting)    Stomach cancer (HCC) 2001   Thyroid  nodule    right    PAST SURGICAL HISTORY: Past Surgical History:  Procedure Laterality Date   BIOPSY THYROID  Right 09/02/1988   CESAREAN SECTION  1975; 1984   COLONOSCOPY WITH PROPOFOL  N/A 05/04/2017   Procedure: COLONOSCOPY WITH PROPOFOL ;  Surgeon: Alvis Jourdain, MD;  Location: [facility];  Service: Endoscopy;  Laterality: N/A;   ESOPHAGOGASTRODUODENOSCOPY (EGD) WITH PROPOFOL  N/A 05/04/2017   Procedure: ESOPHAGOGASTRODUODENOSCOPY (EGD) WITH PROPOFOL ;  Surgeon: Alvis Jourdain, MD;  Location: [facility];  Service: Endoscopy;  Laterality: N/A;   OMENTECTOMY  01/03/1999   partial   PARTIAL GASTRECTOMY  01/03/1999   REVERSE SHOULDER ARTHROPLASTY Right 05/27/2021   Procedure: REVERSE SHOULDER ARTHROPLASTY;  Surgeon: Winston Hawking, MD;  Location: [facility];  Service: Orthopedics;  Laterality: Right;  with ISB   TONSILLECTOMY  ~ 1959   TOTAL HIP ARTHROPLASTY Right 08/18/2014   Procedure: TOTAL HIP ARTHROPLASTY ANTERIOR APPROACH;  Surgeon: Adonica Hoose, MD;  Location: [facility];  Service: Orthopedics;  Laterality: Right;   TOTAL SHOULDER ARTHROPLASTY Left 01/02/2009    FAMILY HISTORY: Family History  Problem Relation Age of Onset   Heart disease Mother    Diabetes Father    Stomach cancer Brother    Multiple sclerosis Other    Breast cancer Sister        2 sisters breast cancer    Breast cancer Maternal Aunt     SOCIAL HISTORY: Social History   Socioeconomic History   Marital status: Divorced    Spouse name: Not on file   Number of children: 3    Years of education: Not on file   Highest education level: Not on file  Occupational History   Occupation: DISABLED    Employer: [employer]  Tobacco Use   Smoking status: Never   Smokeless tobacco: Never    Tobacco comments:    NEVER USED TOBACCO  Vaping Use   Vaping status: Never Used  Substance and Sexual Activity   Alcohol  use: No    Alcohol /week: 0.0 standard drinks of alcohol    Drug use: No   Sexual activity: Not Currently  Other Topics Concern   Not on file  Social History Narrative   Patient is retired.    Patient is married with 3 children.   Patient drinks caffeine daily.  Social Drivers of Corporate investment banker Strain: Not on file  Food Insecurity: No Food Insecurity (04/27/2023)   Hunger Vital Sign    Worried About Running Out of Food in the Last Year: Never true    Ran Out of Food in the Last Year: Never true  Transportation Needs: No Transportation Needs (04/10/2023)   PRAPARE - Administrator, Civil Service (Medical): No    Lack of Transportation (Non-Medical): No  Physical Activity: Sufficiently Active (03/27/2023)   Exercise Vital Sign    Days of Exercise per Week: 7 days    Minutes of Exercise per Session: 120 min  Stress: Not on file  Social Connections: Not on file  Intimate Partner Violence: Not At Risk (04/27/2023)   Humiliation, Afraid, Rape, and Kick questionnaire    Fear of Current or Ex-Partner: No    Emotionally Abused: No    Physically Abused: No    Sexually Abused: No      Phebe Brasil, M.D. Ph.D.  [facility] [address] [place], [place] [address] Ph: [phone] Fax: [phone]  CC:  Sylvan Evener, MD [address] [place],  [place] [address]  Sylvan Evener, MD

## 2023-06-04 NOTE — Telephone Encounter (Signed)
 Received call from [facility] stating that patient continues to lose weight and sees us  next week.  Requested nutrition referral. Nutrition referral place per request

## 2023-06-06 ENCOUNTER — Telehealth: Payer: Self-pay | Admitting: Neurology

## 2023-06-06 NOTE — Telephone Encounter (Signed)
 Pt has been accepted by [facility], they will reach out to get her set up.

## 2023-06-06 NOTE — Telephone Encounter (Signed)
 Elihu Grumet Siegfried Dress: [id number] exp. 06/06/23-08/03/23 sent to GI [phone]

## 2023-06-08 ENCOUNTER — Ambulatory Visit

## 2023-06-08 VITALS — BP 110/80 | HR 70 | Ht 64.0 in | Wt 83.0 lb

## 2023-06-08 DIAGNOSIS — M810 Age-related osteoporosis without current pathological fracture: Secondary | ICD-10-CM

## 2023-06-08 NOTE — Progress Notes (Signed)
 ISylvan Evener, MD, have reviewed all documentation for this visit. The documentation on 06/08/23 for the exam, diagnosis, procedures, and orders are all accurate and complete.

## 2023-06-08 NOTE — Progress Notes (Signed)
 Patient presents to clinic for a Prolia  injection SQ for osteoporosis. Patient tolerated well. Return in 6 months.

## 2023-06-13 ENCOUNTER — Inpatient Hospital Stay: Attending: Hematology & Oncology | Admitting: Dietician

## 2023-06-13 DIAGNOSIS — D510 Vitamin B12 deficiency anemia due to intrinsic factor deficiency: Secondary | ICD-10-CM | POA: Insufficient documentation

## 2023-06-13 DIAGNOSIS — D509 Iron deficiency anemia, unspecified: Secondary | ICD-10-CM | POA: Insufficient documentation

## 2023-06-13 DIAGNOSIS — G35 Multiple sclerosis: Secondary | ICD-10-CM | POA: Insufficient documentation

## 2023-06-13 DIAGNOSIS — R634 Abnormal weight loss: Secondary | ICD-10-CM | POA: Insufficient documentation

## 2023-06-13 DIAGNOSIS — Z85028 Personal history of other malignant neoplasm of stomach: Secondary | ICD-10-CM | POA: Insufficient documentation

## 2023-06-13 NOTE — Progress Notes (Signed)
 Nutrition Assessment Reached out to patient at home telephone#.  Reason for Assessment: Referral for weight loss.    ASSESSMENT: Patient is a 73 year old female with Stage II (T3N0M0) adenocarcinoma of the stomach-remission, recurrent iron  deficiency anemia , pernicious anemia, multiple sclerosis, osteopenia, arthritis, and Vit D deficiency.   Patient reports she wants to live to 100.  She has no appetite, and sometimes she sleeps through meals.  She has been thin all her life and was told to eat anything she wanted but now is concerned with renal and liver function. Seasoning foods with garlic powder, doesn't use salt or pepper. She admits she has always been a picky eater.  Daughter lives in [place] will bring her food sometimes.  Loves hot dogs, hamburgers, chicken.  Usual day intake:  Cereal in morning, (raisin bran or cheerios) , Sausage cheese dog, cheeseburger Will eat someone else's vegetables if someone else cooks it, likes few fruits (tangerines and applesauce). Admits she won't usually cook vegetables for herself as she doesn't want the leftovers in fridge. She does take a MVI to get Vitamins and minerals. Snacks:Potato chips, graham crackers, 2 peppermint patties,  and Pepsi (does diet sodas now) Fluids: cut down on coffees (uses creamer) now doing 3 bottles of water  per day now. 2% Milk twice a month, does like the [employer]life  Anthropometrics: holding within 3# range past year   Height: 64 Weight: 83# UBW: 95-100 (2 years ago) BMI: 14.25    NUTRITION DIAGNOSIS:  Food and Nutrition Related Knowledge Deficit related to cancer and associated treatments as evidenced by no prior need for nutrition related information.   INTERVENTION:  Relayed that nutrition services are wrap around service provided at no charge and encouraged continued communication if experiencing continued weight loss or any nutritional impact symptoms (NIS).  Encouraged continued attention to her fluid  intake, suggested she increase calories with diluted low acid juices (apple or grape) instead of one  bottle of water   Encouraged additional snack each day (fruit, pudding), glass of Fairlife milk  Discussed strategies for increasing nutrients with using frozen vegetables  Emailed Nutrition Tip sheet  for  High Protein High Calorie Snacking and adding protein and calories with contact information provided to preferred email [email or web address]    MONITORING, EVALUATION, GOAL: weight, PO intake, Nutrition Impact Symptoms, labs Goal is weight maintenance  Next Visit: Remote follow up in 2 months  Carleen Chary, RDN, LDN Registered Dietitian,  Cancer Center Part Time Remote (Usual office hours: Tuesday-Thursday) Cell: [phone]

## 2023-06-18 DIAGNOSIS — N319 Neuromuscular dysfunction of bladder, unspecified: Secondary | ICD-10-CM | POA: Diagnosis not present

## 2023-06-18 DIAGNOSIS — G35 Multiple sclerosis: Secondary | ICD-10-CM | POA: Diagnosis not present

## 2023-06-18 DIAGNOSIS — K589 Irritable bowel syndrome without diarrhea: Secondary | ICD-10-CM | POA: Diagnosis not present

## 2023-06-18 DIAGNOSIS — M797 Fibromyalgia: Secondary | ICD-10-CM | POA: Diagnosis not present

## 2023-06-18 DIAGNOSIS — J45909 Unspecified asthma, uncomplicated: Secondary | ICD-10-CM | POA: Diagnosis not present

## 2023-06-18 DIAGNOSIS — K219 Gastro-esophageal reflux disease without esophagitis: Secondary | ICD-10-CM | POA: Diagnosis not present

## 2023-06-18 DIAGNOSIS — I10 Essential (primary) hypertension: Secondary | ICD-10-CM | POA: Diagnosis not present

## 2023-06-18 DIAGNOSIS — M810 Age-related osteoporosis without current pathological fracture: Secondary | ICD-10-CM | POA: Diagnosis not present

## 2023-06-18 DIAGNOSIS — M8580 Other specified disorders of bone density and structure, unspecified site: Secondary | ICD-10-CM | POA: Diagnosis not present

## 2023-06-19 ENCOUNTER — Telehealth: Payer: Self-pay | Admitting: Neurology

## 2023-06-19 NOTE — Telephone Encounter (Signed)
 Marita Sidle from [facility] called needing VO for Home Health PT. Frequency: 1x1 2x4 1x4 She also is needing VO for OT Eval.

## 2023-06-19 NOTE — Telephone Encounter (Signed)
 Left detailed msg agreeable to Vo's

## 2023-06-21 ENCOUNTER — Ambulatory Visit
Admission: RE | Admit: 2023-06-21 | Discharge: 2023-06-21 | Disposition: A | Discharge status: Home / Self Care | Source: Ambulatory Visit | Attending: Neurology | Admitting: Neurology

## 2023-06-21 DIAGNOSIS — G8929 Other chronic pain: Secondary | ICD-10-CM

## 2023-06-21 DIAGNOSIS — R269 Unspecified abnormalities of gait and mobility: Secondary | ICD-10-CM | POA: Diagnosis not present

## 2023-06-21 DIAGNOSIS — M5441 Lumbago with sciatica, right side: Secondary | ICD-10-CM | POA: Diagnosis not present

## 2023-06-21 DIAGNOSIS — G35 Multiple sclerosis: Secondary | ICD-10-CM | POA: Diagnosis not present

## 2023-06-22 ENCOUNTER — Inpatient Hospital Stay

## 2023-06-22 ENCOUNTER — Other Ambulatory Visit: Payer: Self-pay

## 2023-06-22 ENCOUNTER — Inpatient Hospital Stay: Admitting: Hematology & Oncology

## 2023-06-22 ENCOUNTER — Encounter: Payer: Self-pay | Admitting: Hematology & Oncology

## 2023-06-22 VITALS — BP 137/57 | HR 65 | Temp 98.0°F | Resp 16 | Ht 64.0 in | Wt 82.0 lb

## 2023-06-22 DIAGNOSIS — D500 Iron deficiency anemia secondary to blood loss (chronic): Secondary | ICD-10-CM | POA: Diagnosis not present

## 2023-06-22 DIAGNOSIS — K909 Intestinal malabsorption, unspecified: Secondary | ICD-10-CM

## 2023-06-22 DIAGNOSIS — R634 Abnormal weight loss: Secondary | ICD-10-CM | POA: Diagnosis not present

## 2023-06-22 DIAGNOSIS — G35 Multiple sclerosis: Secondary | ICD-10-CM | POA: Diagnosis not present

## 2023-06-22 DIAGNOSIS — E559 Vitamin D deficiency, unspecified: Secondary | ICD-10-CM

## 2023-06-22 DIAGNOSIS — D510 Vitamin B12 deficiency anemia due to intrinsic factor deficiency: Secondary | ICD-10-CM | POA: Diagnosis not present

## 2023-06-22 DIAGNOSIS — D509 Iron deficiency anemia, unspecified: Secondary | ICD-10-CM | POA: Diagnosis not present

## 2023-06-22 DIAGNOSIS — D002 Carcinoma in situ of stomach: Secondary | ICD-10-CM

## 2023-06-22 DIAGNOSIS — Z85028 Personal history of other malignant neoplasm of stomach: Secondary | ICD-10-CM | POA: Diagnosis not present

## 2023-06-22 LAB — CBC WITH DIFFERENTIAL (CANCER CENTER ONLY)
Abs Immature Granulocytes: 0.02 10*3/uL (ref 0.00–0.07)
Basophils Absolute: 0 10*3/uL (ref 0.0–0.1)
Basophils Relative: 1 %
Eosinophils Absolute: 0.2 10*3/uL (ref 0.0–0.5)
Eosinophils Relative: 7 %
HCT: 36.3 % (ref 36.0–46.0)
Hemoglobin: 11.6 g/dL — ABNORMAL LOW (ref 12.0–15.0)
Immature Granulocytes: 1 %
Lymphocytes Relative: 34 %
Lymphs Abs: 1 10*3/uL (ref 0.7–4.0)
MCH: 30.3 pg (ref 26.0–34.0)
MCHC: 32 g/dL (ref 30.0–36.0)
MCV: 94.8 fL (ref 80.0–100.0)
Monocytes Absolute: 0.4 10*3/uL (ref 0.1–1.0)
Monocytes Relative: 13 %
Neutro Abs: 1.3 10*3/uL — ABNORMAL LOW (ref 1.7–7.7)
Neutrophils Relative %: 44 %
Platelet Count: 151 10*3/uL (ref 150–400)
RBC: 3.83 MIL/uL — ABNORMAL LOW (ref 3.87–5.11)
RDW: 12.1 % (ref 11.5–15.5)
WBC Count: 2.9 10*3/uL — ABNORMAL LOW (ref 4.0–10.5)
nRBC: 0 % (ref 0.0–0.2)

## 2023-06-22 LAB — CMP (CANCER CENTER ONLY)
ALT: 36 U/L (ref 0–44)
AST: 31 U/L (ref 15–41)
Albumin: 4.2 g/dL (ref 3.5–5.0)
Alkaline Phosphatase: 72 U/L (ref 38–126)
Anion gap: 6 (ref 5–15)
BUN: 19 mg/dL (ref 8–23)
CO2: 30 mmol/L (ref 22–32)
Calcium: 9.7 mg/dL (ref 8.9–10.3)
Chloride: 107 mmol/L (ref 98–111)
Creatinine: 1.07 mg/dL — ABNORMAL HIGH (ref 0.44–1.00)
GFR, Estimated: 55 mL/min — ABNORMAL LOW (ref >60)
Glucose, Bld: 95 mg/dL (ref 70–99)
Potassium: 5 mmol/L (ref 3.5–5.1)
Sodium: 143 mmol/L (ref 135–145)
Total Bilirubin: 0.8 mg/dL (ref 0.0–1.2)
Total Protein: 7.3 g/dL (ref 6.5–8.1)

## 2023-06-22 MED ORDER — OXANDROLONE 2.5 MG PO TABS: 5.0000 mg | ORAL_TABLET | Freq: Two times a day (BID) | ORAL | 0 refills | Status: DC

## 2023-06-22 NOTE — Progress Notes (Signed)
 Hematology and Oncology Follow Up Visit  Erika Murphy [id number] 15-Oct-1950 73 y.o. 06/22/2023   Principle Diagnosis:  Stage II (T3N0M0) adenocarcinoma of the stomach-remission Recurrent iron  deficiency anemia  Pernicious anemia Multiple sclerosis Current Therapy:   IV iron  as indicated -- Venofer  was last given on 07/21/2022 Vitamin B-12 1 mg IM every month --done at home Prolia  60 mg IM q 6 months -- next dose in 05/2023 at primary care provider's office     Interim History:  [name] returns for follow-up.  Fortunately, it is her weight loss as a real problem.  She continues to lose weight.  I still cannot figure out why she is losing the weight.  She is down 82 pounds.  So far, her tests really have not shown us  anything specific..  She did have an MRI of the lumbar spine yesterday.  The result is not back yet.  I did look at the MRI.  I did not see anything that was obvious.  She has had no problems with diarrhea.  There is been no nausea or vomiting.  She has had no rashes.  There is been no bleeding.  I think we may have to consider her for Oxandrin.  This may help her gain some weight.  Currently, I would say performance status about ECOG 2.     Medications:  Current Outpatient Medications:    acetaminophen  (TYLENOL ) 500 MG tablet, Take 500 mg by mouth every 6 (six) hours as needed for mild pain (pain score 1-3)., Disp: , Rfl:    albuterol  (VENTOLIN  HFA) 108 (90 Base) MCG/ACT inhaler, INHALE 2 PUFFS INTO THE LUNGS EVERY 6 HOURS AS NEEDED FOR SHORTNESS OF BREATH/WHEEZING, Disp: 8.5 each, Rfl: 11   ALPRAZolam  (XANAX ) 0.5 MG tablet, Take one half tab twice daily as needed for anxiety. (Patient taking differently: Take 0.25-0.5 mg by mouth 2 (two) times daily as needed for anxiety. Take one half tab twice daily as needed for anxiety.), Disp: 60 tablet, Rfl: 1   amLODipine  (NORVASC ) 2.5 MG tablet, TAKE 1 TABLET BY MOUTH EVERY DAY, Disp: 90 tablet, Rfl: 3   cholestyramine light  (PREVALITE) 4 g packet, Take 4 g by mouth daily. Patient is taking as needed, Disp: , Rfl:    cyanocobalamin  (VITAMIN B12) 1000 MCG/ML injection, INJECT 1 ML (1,000 MCG TOTAL) INTO THE MUSCLE ONCE A MONTH AS DIRECTED, Disp: 3 mL, Rfl: 3   denosumab  (PROLIA ) 60 MG/ML SOSY injection, Inject 60 mg into the skin every 6 (six) months. Dx code: m81.0.  pt has appt on 05/29/23, Disp: 1 mL, Rfl: 0   esomeprazole  (NEXIUM ) 40 MG capsule, TAKE 1 CAPSULE BY MOUTH DAILY AT 12 NOON, Disp: 90 capsule, Rfl: 3   meclizine  (ANTIVERT ) 25 MG tablet, Take 1 tablet (25 mg total) by mouth 3 (three) times daily as needed for dizziness., Disp: 30 tablet, Rfl: 0   methocarbamol  (ROBAXIN ) 500 MG tablet, TAKE 1 TABLET BY MOUTH EVERY 8 HOURS AS NEEDED FOR MUSCLE SPASMS., Disp: 60 tablet, Rfl: 1   Multiple Vitamins-Minerals (MULTIVITAMIN GUMMIES WOMENS PO), Take 1 tablet by mouth daily., Disp: , Rfl:    pregabalin  (LYRICA ) 50 MG capsule, TAKE 1 CAPSULE BY MOUTH THREE TIMES A DAY, Disp: 90 capsule, Rfl: 1   rosuvastatin  (CRESTOR ) 5 MG tablet, TAKE 1 TABLET (5 MG TOTAL) BY MOUTH DAILY., Disp: 90 tablet, Rfl: 3  Allergies:  Allergies  Allergen Reactions   Grass Pollen(K-O-R-T-Swt Vern) Cough    Past Medical History,  Surgical history, Social history, and Family History were reviewed and updated.  Review of Systems: Review of Systems  Constitutional:  Positive for malaise/fatigue.  HENT:  Positive for tinnitus.   Eyes:  Positive for blurred vision.  Respiratory: Negative.    Cardiovascular: Negative.   Gastrointestinal:  Positive for constipation. Negative for diarrhea.  Genitourinary: Negative.   Musculoskeletal:  Positive for myalgias.  Skin: Negative.   Neurological:  Positive for tingling and weakness.  Endo/Heme/Allergies: Negative.   Psychiatric/Behavioral: Negative.      Physical Exam:  Vital signs are temperature 98.  Pulse 65.  Blood pressure 137/57.  Weight is 82 pounds.  height is 5' 4 (1.626 m) and  weight is 82 lb (37.2 kg).   Physical Exam Vitals reviewed.  Constitutional:      Comments: Very thin African-American female.  She does have some temporal muscle wasting.  There is no adenopathy.  There is no rash.  She has had no palpable liver or spleen.  HENT:     Head: Normocephalic and atraumatic.   Eyes:     Pupils: Pupils are equal, round, and reactive to light.    Cardiovascular:     Rate and Rhythm: Normal rate and regular rhythm.     Heart sounds: Normal heart sounds.  Pulmonary:     Effort: Pulmonary effort is normal.     Breath sounds: Normal breath sounds.  Abdominal:     General: Bowel sounds are normal.     Palpations: Abdomen is soft.   Musculoskeletal:        General: No tenderness or deformity. Normal range of motion.     Cervical back: Normal range of motion.     Comments: She has symmetric muscle atrophy in upper and lower extremities.  She has marked limited range of motion of the right shoulder.  Lymphadenopathy:     Cervical: No cervical adenopathy.   Skin:    General: Skin is warm and dry.     Findings: No erythema or rash.   Neurological:     Mental Status: She is alert and oriented to person, place, and time.   Psychiatric:        Behavior: Behavior normal.        Thought Content: Thought content normal.        Judgment: Judgment normal.      Lab Results  Component Value Date   WBC 2.9 (L) 06/22/2023   HGB 11.6 (L) 06/22/2023   HCT 36.3 06/22/2023   MCV 94.8 06/22/2023   PLT 151 06/22/2023     Chemistry      Component Value Date/Time   NA 143 06/22/2023 0914   NA 144 06/12/2016 1151   NA 142 06/11/2015 1156   K 5.0 06/22/2023 0914   K 4.7 06/12/2016 1151   K 4.3 06/11/2015 1156   CL 107 06/22/2023 0914   CL 107 06/12/2016 1151   CO2 30 06/22/2023 0914   CO2 32 06/12/2016 1151   CO2 29 06/11/2015 1156   BUN 19 06/22/2023 0914   BUN 11 06/12/2016 1151   BUN 13.5 06/11/2015 1156   CREATININE 1.07 (H) 06/22/2023 0914    CREATININE 1.07 (H) 05/29/2023 1127   CREATININE 1.2 (H) 06/11/2015 1156      Component Value Date/Time   CALCIUM  9.7 06/22/2023 0914   CALCIUM  9.6 06/12/2016 1151   CALCIUM  9.7 06/11/2015 1156   ALKPHOS 72 06/22/2023 0914   ALKPHOS 97 (H) 06/12/2016 1151   ALKPHOS  111 06/11/2015 1156   AST 31 06/22/2023 0914   AST 30 06/11/2015 1156   ALT 36 06/22/2023 0914   ALT 26 06/12/2016 1151   ALT 23 06/11/2015 1156   BILITOT 0.8 06/22/2023 0914   BILITOT 1.03 06/11/2015 1156      Impression and Plan: Erika Murphy is a 73 year old African [place] female. She had a history of stage II stomach cancer. She underwent resection. She had adjuvant therapy.  She  is now over 23 years out from treatment. She is cured of the stomach cancer.   Again, I am not sure as to why she has a weight loss.  We will see if Oxandrin can work.  I will give her 5 mg p.o. twice daily.  Hopefully, insurance will pay for this.  I would like to see her back in 1 month.  Hopefully, the weight will be at least stable if not a little bit better.    Ivor Mars, MD 6/20/20259:50 AM

## 2023-06-25 ENCOUNTER — Ambulatory Visit: Payer: Self-pay | Admitting: Neurology

## 2023-06-26 DIAGNOSIS — J45909 Unspecified asthma, uncomplicated: Secondary | ICD-10-CM | POA: Diagnosis not present

## 2023-06-26 DIAGNOSIS — K589 Irritable bowel syndrome without diarrhea: Secondary | ICD-10-CM | POA: Diagnosis not present

## 2023-06-26 DIAGNOSIS — M810 Age-related osteoporosis without current pathological fracture: Secondary | ICD-10-CM | POA: Diagnosis not present

## 2023-06-26 DIAGNOSIS — K219 Gastro-esophageal reflux disease without esophagitis: Secondary | ICD-10-CM | POA: Diagnosis not present

## 2023-06-26 DIAGNOSIS — G35 Multiple sclerosis: Secondary | ICD-10-CM | POA: Diagnosis not present

## 2023-06-26 DIAGNOSIS — M8580 Other specified disorders of bone density and structure, unspecified site: Secondary | ICD-10-CM | POA: Diagnosis not present

## 2023-06-26 DIAGNOSIS — M797 Fibromyalgia: Secondary | ICD-10-CM | POA: Diagnosis not present

## 2023-06-26 DIAGNOSIS — I10 Essential (primary) hypertension: Secondary | ICD-10-CM | POA: Diagnosis not present

## 2023-06-26 DIAGNOSIS — N319 Neuromuscular dysfunction of bladder, unspecified: Secondary | ICD-10-CM | POA: Diagnosis not present

## 2023-06-27 ENCOUNTER — Telehealth: Payer: Self-pay

## 2023-06-27 NOTE — Telephone Encounter (Signed)
 Received phone call from patient stating that her pharmacy is unable to fill the Oxandrin. Upon further investigation, Oxandrin was discontinued by the manufacturer and no longer available. Dr. Timmy aware and no new orders received. Pt educated on high calorie foods and to increase protein intake. Pt states she has already spoken with a dietician and will continue to try to gain weight with food. Pt appreciative of call and had no further questions.

## 2023-06-28 DIAGNOSIS — J45909 Unspecified asthma, uncomplicated: Secondary | ICD-10-CM | POA: Diagnosis not present

## 2023-06-28 DIAGNOSIS — K219 Gastro-esophageal reflux disease without esophagitis: Secondary | ICD-10-CM | POA: Diagnosis not present

## 2023-06-28 DIAGNOSIS — M797 Fibromyalgia: Secondary | ICD-10-CM | POA: Diagnosis not present

## 2023-06-28 DIAGNOSIS — N319 Neuromuscular dysfunction of bladder, unspecified: Secondary | ICD-10-CM | POA: Diagnosis not present

## 2023-06-28 DIAGNOSIS — M8580 Other specified disorders of bone density and structure, unspecified site: Secondary | ICD-10-CM | POA: Diagnosis not present

## 2023-06-28 DIAGNOSIS — M810 Age-related osteoporosis without current pathological fracture: Secondary | ICD-10-CM | POA: Diagnosis not present

## 2023-06-28 DIAGNOSIS — I10 Essential (primary) hypertension: Secondary | ICD-10-CM | POA: Diagnosis not present

## 2023-06-28 DIAGNOSIS — G35 Multiple sclerosis: Secondary | ICD-10-CM | POA: Diagnosis not present

## 2023-06-28 DIAGNOSIS — K589 Irritable bowel syndrome without diarrhea: Secondary | ICD-10-CM | POA: Diagnosis not present

## 2023-06-29 DIAGNOSIS — G35 Multiple sclerosis: Secondary | ICD-10-CM | POA: Diagnosis not present

## 2023-06-29 DIAGNOSIS — M797 Fibromyalgia: Secondary | ICD-10-CM | POA: Diagnosis not present

## 2023-06-29 DIAGNOSIS — K219 Gastro-esophageal reflux disease without esophagitis: Secondary | ICD-10-CM | POA: Diagnosis not present

## 2023-06-29 DIAGNOSIS — M8580 Other specified disorders of bone density and structure, unspecified site: Secondary | ICD-10-CM | POA: Diagnosis not present

## 2023-06-29 DIAGNOSIS — I10 Essential (primary) hypertension: Secondary | ICD-10-CM | POA: Diagnosis not present

## 2023-06-29 DIAGNOSIS — K589 Irritable bowel syndrome without diarrhea: Secondary | ICD-10-CM | POA: Diagnosis not present

## 2023-06-29 DIAGNOSIS — N319 Neuromuscular dysfunction of bladder, unspecified: Secondary | ICD-10-CM | POA: Diagnosis not present

## 2023-06-29 DIAGNOSIS — M810 Age-related osteoporosis without current pathological fracture: Secondary | ICD-10-CM | POA: Diagnosis not present

## 2023-06-29 DIAGNOSIS — J45909 Unspecified asthma, uncomplicated: Secondary | ICD-10-CM | POA: Diagnosis not present

## 2023-07-02 ENCOUNTER — Telehealth: Payer: Self-pay

## 2023-07-03 DIAGNOSIS — J45909 Unspecified asthma, uncomplicated: Secondary | ICD-10-CM | POA: Diagnosis not present

## 2023-07-03 DIAGNOSIS — K589 Irritable bowel syndrome without diarrhea: Secondary | ICD-10-CM | POA: Diagnosis not present

## 2023-07-03 DIAGNOSIS — M8580 Other specified disorders of bone density and structure, unspecified site: Secondary | ICD-10-CM | POA: Diagnosis not present

## 2023-07-03 DIAGNOSIS — N319 Neuromuscular dysfunction of bladder, unspecified: Secondary | ICD-10-CM | POA: Diagnosis not present

## 2023-07-03 DIAGNOSIS — K219 Gastro-esophageal reflux disease without esophagitis: Secondary | ICD-10-CM | POA: Diagnosis not present

## 2023-07-03 DIAGNOSIS — I10 Essential (primary) hypertension: Secondary | ICD-10-CM | POA: Diagnosis not present

## 2023-07-03 DIAGNOSIS — M797 Fibromyalgia: Secondary | ICD-10-CM | POA: Diagnosis not present

## 2023-07-03 DIAGNOSIS — M810 Age-related osteoporosis without current pathological fracture: Secondary | ICD-10-CM | POA: Diagnosis not present

## 2023-07-03 DIAGNOSIS — G35 Multiple sclerosis: Secondary | ICD-10-CM | POA: Diagnosis not present

## 2023-07-04 DIAGNOSIS — M797 Fibromyalgia: Secondary | ICD-10-CM | POA: Diagnosis not present

## 2023-07-04 DIAGNOSIS — K589 Irritable bowel syndrome without diarrhea: Secondary | ICD-10-CM | POA: Diagnosis not present

## 2023-07-04 DIAGNOSIS — I10 Essential (primary) hypertension: Secondary | ICD-10-CM | POA: Diagnosis not present

## 2023-07-04 DIAGNOSIS — N319 Neuromuscular dysfunction of bladder, unspecified: Secondary | ICD-10-CM | POA: Diagnosis not present

## 2023-07-04 DIAGNOSIS — G35 Multiple sclerosis: Secondary | ICD-10-CM | POA: Diagnosis not present

## 2023-07-04 DIAGNOSIS — M8580 Other specified disorders of bone density and structure, unspecified site: Secondary | ICD-10-CM | POA: Diagnosis not present

## 2023-07-04 DIAGNOSIS — J45909 Unspecified asthma, uncomplicated: Secondary | ICD-10-CM | POA: Diagnosis not present

## 2023-07-04 DIAGNOSIS — K219 Gastro-esophageal reflux disease without esophagitis: Secondary | ICD-10-CM | POA: Diagnosis not present

## 2023-07-04 DIAGNOSIS — M810 Age-related osteoporosis without current pathological fracture: Secondary | ICD-10-CM | POA: Diagnosis not present

## 2023-07-09 DIAGNOSIS — N319 Neuromuscular dysfunction of bladder, unspecified: Secondary | ICD-10-CM | POA: Diagnosis not present

## 2023-07-09 DIAGNOSIS — G35 Multiple sclerosis: Secondary | ICD-10-CM | POA: Diagnosis not present

## 2023-07-09 DIAGNOSIS — M8580 Other specified disorders of bone density and structure, unspecified site: Secondary | ICD-10-CM | POA: Diagnosis not present

## 2023-07-09 DIAGNOSIS — M810 Age-related osteoporosis without current pathological fracture: Secondary | ICD-10-CM | POA: Diagnosis not present

## 2023-07-09 DIAGNOSIS — K589 Irritable bowel syndrome without diarrhea: Secondary | ICD-10-CM | POA: Diagnosis not present

## 2023-07-09 DIAGNOSIS — I10 Essential (primary) hypertension: Secondary | ICD-10-CM | POA: Diagnosis not present

## 2023-07-09 DIAGNOSIS — J45909 Unspecified asthma, uncomplicated: Secondary | ICD-10-CM | POA: Diagnosis not present

## 2023-07-09 DIAGNOSIS — M797 Fibromyalgia: Secondary | ICD-10-CM | POA: Diagnosis not present

## 2023-07-09 DIAGNOSIS — K219 Gastro-esophageal reflux disease without esophagitis: Secondary | ICD-10-CM | POA: Diagnosis not present

## 2023-07-11 DIAGNOSIS — J45909 Unspecified asthma, uncomplicated: Secondary | ICD-10-CM | POA: Diagnosis not present

## 2023-07-11 DIAGNOSIS — I10 Essential (primary) hypertension: Secondary | ICD-10-CM | POA: Diagnosis not present

## 2023-07-11 DIAGNOSIS — M797 Fibromyalgia: Secondary | ICD-10-CM | POA: Diagnosis not present

## 2023-07-11 DIAGNOSIS — K219 Gastro-esophageal reflux disease without esophagitis: Secondary | ICD-10-CM | POA: Diagnosis not present

## 2023-07-11 DIAGNOSIS — G35 Multiple sclerosis: Secondary | ICD-10-CM | POA: Diagnosis not present

## 2023-07-11 DIAGNOSIS — M8580 Other specified disorders of bone density and structure, unspecified site: Secondary | ICD-10-CM | POA: Diagnosis not present

## 2023-07-11 DIAGNOSIS — N319 Neuromuscular dysfunction of bladder, unspecified: Secondary | ICD-10-CM | POA: Diagnosis not present

## 2023-07-11 DIAGNOSIS — M810 Age-related osteoporosis without current pathological fracture: Secondary | ICD-10-CM | POA: Diagnosis not present

## 2023-07-11 DIAGNOSIS — K589 Irritable bowel syndrome without diarrhea: Secondary | ICD-10-CM | POA: Diagnosis not present

## 2023-07-12 DIAGNOSIS — I10 Essential (primary) hypertension: Secondary | ICD-10-CM | POA: Diagnosis not present

## 2023-07-12 DIAGNOSIS — M797 Fibromyalgia: Secondary | ICD-10-CM | POA: Diagnosis not present

## 2023-07-12 DIAGNOSIS — M8580 Other specified disorders of bone density and structure, unspecified site: Secondary | ICD-10-CM | POA: Diagnosis not present

## 2023-07-12 DIAGNOSIS — G35 Multiple sclerosis: Secondary | ICD-10-CM | POA: Diagnosis not present

## 2023-07-12 DIAGNOSIS — N319 Neuromuscular dysfunction of bladder, unspecified: Secondary | ICD-10-CM | POA: Diagnosis not present

## 2023-07-12 DIAGNOSIS — K589 Irritable bowel syndrome without diarrhea: Secondary | ICD-10-CM | POA: Diagnosis not present

## 2023-07-12 DIAGNOSIS — J45909 Unspecified asthma, uncomplicated: Secondary | ICD-10-CM | POA: Diagnosis not present

## 2023-07-12 DIAGNOSIS — K219 Gastro-esophageal reflux disease without esophagitis: Secondary | ICD-10-CM | POA: Diagnosis not present

## 2023-07-12 DIAGNOSIS — M810 Age-related osteoporosis without current pathological fracture: Secondary | ICD-10-CM | POA: Diagnosis not present

## 2023-07-17 DIAGNOSIS — M797 Fibromyalgia: Secondary | ICD-10-CM | POA: Diagnosis not present

## 2023-07-17 DIAGNOSIS — J45909 Unspecified asthma, uncomplicated: Secondary | ICD-10-CM | POA: Diagnosis not present

## 2023-07-17 DIAGNOSIS — M8580 Other specified disorders of bone density and structure, unspecified site: Secondary | ICD-10-CM | POA: Diagnosis not present

## 2023-07-17 DIAGNOSIS — K589 Irritable bowel syndrome without diarrhea: Secondary | ICD-10-CM | POA: Diagnosis not present

## 2023-07-17 DIAGNOSIS — M810 Age-related osteoporosis without current pathological fracture: Secondary | ICD-10-CM | POA: Diagnosis not present

## 2023-07-17 DIAGNOSIS — N319 Neuromuscular dysfunction of bladder, unspecified: Secondary | ICD-10-CM | POA: Diagnosis not present

## 2023-07-17 DIAGNOSIS — G35 Multiple sclerosis: Secondary | ICD-10-CM | POA: Diagnosis not present

## 2023-07-17 DIAGNOSIS — K219 Gastro-esophageal reflux disease without esophagitis: Secondary | ICD-10-CM | POA: Diagnosis not present

## 2023-07-17 DIAGNOSIS — I10 Essential (primary) hypertension: Secondary | ICD-10-CM | POA: Diagnosis not present

## 2023-07-18 DIAGNOSIS — G35 Multiple sclerosis: Secondary | ICD-10-CM | POA: Diagnosis not present

## 2023-07-18 DIAGNOSIS — N319 Neuromuscular dysfunction of bladder, unspecified: Secondary | ICD-10-CM | POA: Diagnosis not present

## 2023-07-18 DIAGNOSIS — K589 Irritable bowel syndrome without diarrhea: Secondary | ICD-10-CM | POA: Diagnosis not present

## 2023-07-18 DIAGNOSIS — M810 Age-related osteoporosis without current pathological fracture: Secondary | ICD-10-CM | POA: Diagnosis not present

## 2023-07-18 DIAGNOSIS — M797 Fibromyalgia: Secondary | ICD-10-CM | POA: Diagnosis not present

## 2023-07-18 DIAGNOSIS — J45909 Unspecified asthma, uncomplicated: Secondary | ICD-10-CM | POA: Diagnosis not present

## 2023-07-18 DIAGNOSIS — M8580 Other specified disorders of bone density and structure, unspecified site: Secondary | ICD-10-CM | POA: Diagnosis not present

## 2023-07-18 DIAGNOSIS — K219 Gastro-esophageal reflux disease without esophagitis: Secondary | ICD-10-CM | POA: Diagnosis not present

## 2023-07-18 DIAGNOSIS — I10 Essential (primary) hypertension: Secondary | ICD-10-CM | POA: Diagnosis not present

## 2023-07-19 DIAGNOSIS — M810 Age-related osteoporosis without current pathological fracture: Secondary | ICD-10-CM | POA: Diagnosis not present

## 2023-07-19 DIAGNOSIS — K589 Irritable bowel syndrome without diarrhea: Secondary | ICD-10-CM | POA: Diagnosis not present

## 2023-07-19 DIAGNOSIS — J45909 Unspecified asthma, uncomplicated: Secondary | ICD-10-CM | POA: Diagnosis not present

## 2023-07-19 DIAGNOSIS — M8580 Other specified disorders of bone density and structure, unspecified site: Secondary | ICD-10-CM | POA: Diagnosis not present

## 2023-07-19 DIAGNOSIS — G35 Multiple sclerosis: Secondary | ICD-10-CM | POA: Diagnosis not present

## 2023-07-19 DIAGNOSIS — M797 Fibromyalgia: Secondary | ICD-10-CM | POA: Diagnosis not present

## 2023-07-19 DIAGNOSIS — N319 Neuromuscular dysfunction of bladder, unspecified: Secondary | ICD-10-CM | POA: Diagnosis not present

## 2023-07-19 DIAGNOSIS — I10 Essential (primary) hypertension: Secondary | ICD-10-CM | POA: Diagnosis not present

## 2023-07-19 DIAGNOSIS — K219 Gastro-esophageal reflux disease without esophagitis: Secondary | ICD-10-CM | POA: Diagnosis not present

## 2023-07-26 DIAGNOSIS — G35 Multiple sclerosis: Secondary | ICD-10-CM | POA: Diagnosis not present

## 2023-07-26 DIAGNOSIS — K589 Irritable bowel syndrome without diarrhea: Secondary | ICD-10-CM | POA: Diagnosis not present

## 2023-07-26 DIAGNOSIS — M8580 Other specified disorders of bone density and structure, unspecified site: Secondary | ICD-10-CM | POA: Diagnosis not present

## 2023-07-26 DIAGNOSIS — K219 Gastro-esophageal reflux disease without esophagitis: Secondary | ICD-10-CM | POA: Diagnosis not present

## 2023-07-26 DIAGNOSIS — N319 Neuromuscular dysfunction of bladder, unspecified: Secondary | ICD-10-CM | POA: Diagnosis not present

## 2023-07-26 DIAGNOSIS — M810 Age-related osteoporosis without current pathological fracture: Secondary | ICD-10-CM | POA: Diagnosis not present

## 2023-07-26 DIAGNOSIS — J45909 Unspecified asthma, uncomplicated: Secondary | ICD-10-CM | POA: Diagnosis not present

## 2023-07-26 DIAGNOSIS — M797 Fibromyalgia: Secondary | ICD-10-CM | POA: Diagnosis not present

## 2023-07-26 DIAGNOSIS — I10 Essential (primary) hypertension: Secondary | ICD-10-CM | POA: Diagnosis not present

## 2023-07-27 ENCOUNTER — Inpatient Hospital Stay: Attending: Hematology & Oncology

## 2023-07-27 ENCOUNTER — Inpatient Hospital Stay: Admitting: Hematology & Oncology

## 2023-07-27 VITALS — BP 125/70 | HR 68 | Temp 98.1°F | Resp 17 | Wt 81.0 lb

## 2023-07-27 DIAGNOSIS — Z85028 Personal history of other malignant neoplasm of stomach: Secondary | ICD-10-CM | POA: Diagnosis not present

## 2023-07-27 DIAGNOSIS — D500 Iron deficiency anemia secondary to blood loss (chronic): Secondary | ICD-10-CM

## 2023-07-27 DIAGNOSIS — Z79899 Other long term (current) drug therapy: Secondary | ICD-10-CM | POA: Diagnosis not present

## 2023-07-27 DIAGNOSIS — R7989 Other specified abnormal findings of blood chemistry: Secondary | ICD-10-CM | POA: Diagnosis not present

## 2023-07-27 DIAGNOSIS — D510 Vitamin B12 deficiency anemia due to intrinsic factor deficiency: Secondary | ICD-10-CM | POA: Insufficient documentation

## 2023-07-27 DIAGNOSIS — D002 Carcinoma in situ of stomach: Secondary | ICD-10-CM

## 2023-07-27 DIAGNOSIS — D509 Iron deficiency anemia, unspecified: Secondary | ICD-10-CM | POA: Insufficient documentation

## 2023-07-27 DIAGNOSIS — R634 Abnormal weight loss: Secondary | ICD-10-CM | POA: Insufficient documentation

## 2023-07-27 DIAGNOSIS — G35 Multiple sclerosis: Secondary | ICD-10-CM | POA: Insufficient documentation

## 2023-07-27 DIAGNOSIS — E559 Vitamin D deficiency, unspecified: Secondary | ICD-10-CM

## 2023-07-27 DIAGNOSIS — M79606 Pain in leg, unspecified: Secondary | ICD-10-CM | POA: Diagnosis not present

## 2023-07-27 DIAGNOSIS — K909 Intestinal malabsorption, unspecified: Secondary | ICD-10-CM

## 2023-07-27 LAB — CMP (CANCER CENTER ONLY)
ALT: 50 U/L — ABNORMAL HIGH (ref 0–44)
AST: 50 U/L — ABNORMAL HIGH (ref 15–41)
Albumin: 4.4 g/dL (ref 3.5–5.0)
Alkaline Phosphatase: 83 U/L (ref 38–126)
Anion gap: 11 (ref 5–15)
BUN: 18 mg/dL (ref 8–23)
CO2: 28 mmol/L (ref 22–32)
Calcium: 9.7 mg/dL (ref 8.9–10.3)
Chloride: 105 mmol/L (ref 98–111)
Creatinine: 1.03 mg/dL — ABNORMAL HIGH (ref 0.44–1.00)
GFR, Estimated: 56 mL/min — ABNORMAL LOW (ref >60)
Glucose, Bld: 94 mg/dL (ref 70–99)
Potassium: 4.4 mmol/L (ref 3.5–5.1)
Sodium: 144 mmol/L (ref 135–145)
Total Bilirubin: 0.8 mg/dL (ref 0.0–1.2)
Total Protein: 7.7 g/dL (ref 6.5–8.1)

## 2023-07-27 LAB — CBC WITH DIFFERENTIAL (CANCER CENTER ONLY)
Abs Immature Granulocytes: 0.01 K/uL (ref 0.00–0.07)
Basophils Absolute: 0 K/uL (ref 0.0–0.1)
Basophils Relative: 1 %
Eosinophils Absolute: 0.1 K/uL (ref 0.0–0.5)
Eosinophils Relative: 3 %
HCT: 38.7 % (ref 36.0–46.0)
Hemoglobin: 12.6 g/dL (ref 12.0–15.0)
Immature Granulocytes: 0 %
Lymphocytes Relative: 36 %
Lymphs Abs: 1.1 K/uL (ref 0.7–4.0)
MCH: 30.5 pg (ref 26.0–34.0)
MCHC: 32.6 g/dL (ref 30.0–36.0)
MCV: 93.7 fL (ref 80.0–100.0)
Monocytes Absolute: 0.3 K/uL (ref 0.1–1.0)
Monocytes Relative: 9 %
Neutro Abs: 1.5 K/uL — ABNORMAL LOW (ref 1.7–7.7)
Neutrophils Relative %: 51 %
Platelet Count: 159 K/uL (ref 150–400)
RBC: 4.13 MIL/uL (ref 3.87–5.11)
RDW: 11.9 % (ref 11.5–15.5)
WBC Count: 2.9 K/uL — ABNORMAL LOW (ref 4.0–10.5)
nRBC: 0 % (ref 0.0–0.2)

## 2023-07-27 LAB — PREALBUMIN: Prealbumin: 20 mg/dL (ref 18–38)

## 2023-07-27 LAB — TSH: TSH: 0.57 u[IU]/mL (ref 0.350–4.500)

## 2023-07-27 NOTE — Progress Notes (Signed)
 Hematology and Oncology Follow Up Visit  Erika Murphy [id number] 10/18/1950 73 y.o. 07/27/2023   Principle Diagnosis:  Stage II (T3N0M0) adenocarcinoma of the stomach-remission Recurrent iron  deficiency anemia  Pernicious anemia Multiple sclerosis Current Therapy:   IV iron  as indicated -- Venofer  was last given on 07/21/2022 Vitamin B-12 1 mg IM every month --done at home Prolia  60 mg IM q 6 months -- next dose in 05/2023 at primary care provider's office     Interim History:  [name] returns for follow-up.  Her weight is stabilized a little bit.  Unfortunately, we cannot get her to the Oxandrin.  I thought this would really help her out..  She had her 73th birthday couple weeks ago.  About 200 people showed up.  I am really not surprised by this.  She is incredibly popular.  She has a very strong community member.  She is having no abdominal pain.  She is having no change in bowel or bladder habits.  What is interesting is that she has a lot of leg pain.  She says that when air blows on her legs, they hurt quite a bit.  I think she is going to see Orthopedic Surgery next week.  I am not sure what could be causing this.  She does have some slightly elevated LFTs on her exam today.  I will go ahead and get an ultrasound of her liver to make sure nothing is going on there.  She has had no issues with nausea or vomiting.  There is been no bleeding.  She had MRI of the lumbar spine on 06/21/2023.  This does show some degenerative changes.  Overall, I would say performance status of probably ECOG 2.      Medications:  Current Outpatient Medications:    acetaminophen  (TYLENOL ) 500 MG tablet, Take 500 mg by mouth every 6 (six) hours as needed for mild pain (pain score 1-3)., Disp: , Rfl:    albuterol  (VENTOLIN  HFA) 108 (90 Base) MCG/ACT inhaler, INHALE 2 PUFFS INTO THE LUNGS EVERY 6 HOURS AS NEEDED FOR SHORTNESS OF BREATH/WHEEZING, Disp: 8.5 each, Rfl: 11   ALPRAZolam  (XANAX ) 0.5 MG  tablet, Take one half tab twice daily as needed for anxiety. (Patient taking differently: Take 0.25-0.5 mg by mouth 2 (two) times daily as needed for anxiety. Take one half tab twice daily as needed for anxiety.), Disp: 60 tablet, Rfl: 1   amLODipine  (NORVASC ) 2.5 MG tablet, TAKE 1 TABLET BY MOUTH EVERY DAY, Disp: 90 tablet, Rfl: 3   cholestyramine light (PREVALITE) 4 g packet, Take 4 g by mouth daily. Patient is taking as needed, Disp: , Rfl:    cyanocobalamin  (VITAMIN B12) 1000 MCG/ML injection, INJECT 1 ML (1,000 MCG TOTAL) INTO THE MUSCLE ONCE A MONTH AS DIRECTED, Disp: 3 mL, Rfl: 3   denosumab  (PROLIA ) 60 MG/ML SOSY injection, Inject 60 mg into the skin every 6 (six) months. Dx code: m81.0.  pt has appt on 05/29/23, Disp: 1 mL, Rfl: 0   esomeprazole  (NEXIUM ) 40 MG capsule, TAKE 1 CAPSULE BY MOUTH DAILY AT 12 NOON, Disp: 90 capsule, Rfl: 3   meclizine  (ANTIVERT ) 25 MG tablet, Take 1 tablet (25 mg total) by mouth 3 (three) times daily as needed for dizziness., Disp: 30 tablet, Rfl: 0   methocarbamol  (ROBAXIN ) 500 MG tablet, TAKE 1 TABLET BY MOUTH EVERY 8 HOURS AS NEEDED FOR MUSCLE SPASMS., Disp: 60 tablet, Rfl: 1   Multiple Vitamins-Minerals (MULTIVITAMIN GUMMIES WOMENS PO), Take 1  tablet by mouth daily., Disp: , Rfl:    pregabalin  (LYRICA ) 50 MG capsule, TAKE 1 CAPSULE BY MOUTH THREE TIMES A DAY, Disp: 90 capsule, Rfl: 1   rosuvastatin  (CRESTOR ) 5 MG tablet, TAKE 1 TABLET (5 MG TOTAL) BY MOUTH DAILY., Disp: 90 tablet, Rfl: 3   oxandrolone  (OXANDRIN) 2.5 MG tablet, Take 2 tablets (5 mg total) by mouth 2 (two) times daily. (Patient not taking: Reported on 07/27/2023), Disp: 120 tablet, Rfl: 0  Allergies:  Allergies  Allergen Reactions   Grass Pollen(K-O-R-T-Swt Vern) Cough    Past Medical History, Surgical history, Social history, and Family History were reviewed and updated.  Review of Systems: Review of Systems  Constitutional:  Positive for malaise/fatigue.  HENT:  Positive for tinnitus.    Eyes:  Positive for blurred vision.  Respiratory: Negative.    Cardiovascular: Negative.   Gastrointestinal:  Positive for constipation. Negative for diarrhea.  Genitourinary: Negative.   Musculoskeletal:  Positive for myalgias.  Skin: Negative.   Neurological:  Positive for tingling and weakness.  Endo/Heme/Allergies: Negative.   Psychiatric/Behavioral: Negative.      Physical Exam:  Vital signs are temperature 98.  Pulse 65.  Blood pressure 137/57.  Weight is 81 pounds.  weight is 81 lb (36.7 kg). Her oral temperature is 98.1 F (36.7 C). Her blood pressure is 125/70 and her pulse is 68. Her respiration is 17 and oxygen  saturation is 100%.   Physical Exam Vitals reviewed.  Constitutional:      Comments: Very thin African-American female.  She does have some temporal muscle wasting.  There is no adenopathy.  There is no rash.  She has had no palpable liver or spleen.  HENT:     Head: Normocephalic and atraumatic.  Eyes:     Pupils: Pupils are equal, round, and reactive to light.  Cardiovascular:     Rate and Rhythm: Normal rate and regular rhythm.     Heart sounds: Normal heart sounds.  Pulmonary:     Effort: Pulmonary effort is normal.     Breath sounds: Normal breath sounds.  Abdominal:     General: Bowel sounds are normal.     Palpations: Abdomen is soft.  Musculoskeletal:        General: No tenderness or deformity. Normal range of motion.     Cervical back: Normal range of motion.     Comments: She has symmetric muscle atrophy in upper and lower extremities.  She has marked limited range of motion of the right shoulder.  Lymphadenopathy:     Cervical: No cervical adenopathy.  Skin:    General: Skin is warm and dry.     Findings: No erythema or rash.  Neurological:     Mental Status: She is alert and oriented to person, place, and time.  Psychiatric:        Behavior: Behavior normal.        Thought Content: Thought content normal.        Judgment: Judgment  normal.      Lab Results  Component Value Date   WBC 2.9 (L) 07/27/2023   HGB 12.6 07/27/2023   HCT 38.7 07/27/2023   MCV 93.7 07/27/2023   PLT 159 07/27/2023     Chemistry      Component Value Date/Time   NA 144 07/27/2023 0914   NA 144 06/12/2016 1151   NA 142 06/11/2015 1156   K 4.4 07/27/2023 0914   K 4.7 06/12/2016 1151   K 4.3 06/11/2015 1156  CL 105 07/27/2023 0914   CL 107 06/12/2016 1151   CO2 28 07/27/2023 0914   CO2 32 06/12/2016 1151   CO2 29 06/11/2015 1156   BUN 18 07/27/2023 0914   BUN 11 06/12/2016 1151   BUN 13.5 06/11/2015 1156   CREATININE 1.03 (H) 07/27/2023 0914   CREATININE 1.07 (H) 05/29/2023 1127   CREATININE 1.2 (H) 06/11/2015 1156      Component Value Date/Time   CALCIUM  9.7 07/27/2023 0914   CALCIUM  9.6 06/12/2016 1151   CALCIUM  9.7 06/11/2015 1156   ALKPHOS 83 07/27/2023 0914   ALKPHOS 97 (H) 06/12/2016 1151   ALKPHOS 111 06/11/2015 1156   AST 50 (H) 07/27/2023 0914   AST 30 06/11/2015 1156   ALT 50 (H) 07/27/2023 0914   ALT 26 06/12/2016 1151   ALT 23 06/11/2015 1156   BILITOT 0.8 07/27/2023 0914   BILITOT 1.03 06/11/2015 1156      Impression and Plan: Ms. Kuennen is a 73 year old African [place] female. She had a history of stage II stomach cancer. She underwent resection. She had adjuvant therapy.  She  is now over 23 years out from treatment. She is cured of the stomach cancer in my opinion.  Again, the weight loss certainly is an usual.  I really do not have a good explanation for this.  I cannot imagine this is anything related to her past cancer.  I do not know if this might be something with her the multiple sclerosis.  Again the LFTs were slightly elevated.  I will go ahead and get ultrasound.  Will plan to get her back in about 6 weeks or so.  Hopefully, her weight will be trending upward.   Maude JONELLE Crease, MD 7/25/202510:47 AM

## 2023-07-30 ENCOUNTER — Telehealth: Payer: Self-pay

## 2023-07-30 DIAGNOSIS — J45909 Unspecified asthma, uncomplicated: Secondary | ICD-10-CM | POA: Diagnosis not present

## 2023-07-30 DIAGNOSIS — G35 Multiple sclerosis: Secondary | ICD-10-CM | POA: Diagnosis not present

## 2023-07-30 DIAGNOSIS — K589 Irritable bowel syndrome without diarrhea: Secondary | ICD-10-CM | POA: Diagnosis not present

## 2023-07-30 DIAGNOSIS — M810 Age-related osteoporosis without current pathological fracture: Secondary | ICD-10-CM | POA: Diagnosis not present

## 2023-07-30 DIAGNOSIS — I10 Essential (primary) hypertension: Secondary | ICD-10-CM | POA: Diagnosis not present

## 2023-07-30 DIAGNOSIS — K219 Gastro-esophageal reflux disease without esophagitis: Secondary | ICD-10-CM | POA: Diagnosis not present

## 2023-07-30 DIAGNOSIS — M8580 Other specified disorders of bone density and structure, unspecified site: Secondary | ICD-10-CM | POA: Diagnosis not present

## 2023-07-30 DIAGNOSIS — M797 Fibromyalgia: Secondary | ICD-10-CM | POA: Diagnosis not present

## 2023-07-30 DIAGNOSIS — N319 Neuromuscular dysfunction of bladder, unspecified: Secondary | ICD-10-CM | POA: Diagnosis not present

## 2023-07-30 NOTE — Telephone Encounter (Signed)
Orders faxed to [facility] 

## 2023-07-31 ENCOUNTER — Other Ambulatory Visit: Payer: Self-pay

## 2023-07-31 NOTE — Patient Outreach (Signed)
 Complex Care Management   Visit Note  07/31/2023  Name:  Erika Murphy MRN: [id number] DOB: 11/05/1950  Situation: Referral received for Complex Care Management related to  Multiple Sclerosis, low body weight, Carcinoma in situ of stomach, Osteoporosis, elevated liver enzymes, lumbar degenerative changes with back pain. I obtained verbal consent from Patient.  Visit completed with patient on the phone.  Background:   Past Medical History:  Diagnosis Date   Anxiety    Arthritis    right shoulder (10/09/2013)   Asthma    pt denies   B12 deficiency anemia    pt denies   Fibromyalgia    GERD (gastroesophageal reflux disease)    History of blood transfusion 2001   when I had my stomach cancer   Hypertension    IBS (irritable bowel syndrome)    Iron  deficiency anemia 12/11/2014   Malabsorption of iron  12/11/2014   MS (multiple sclerosis) (HCC) dx'd ~ 1998   Neurogenic bladder    due to MS   Osteopenia    Osteoporosis    PONV (postoperative nausea and vomiting)    Stomach cancer (HCC) 2001   Thyroid  nodule    right    Assessment: Patient Reported Symptoms:  Cognitive Cognitive Status: Alert and oriented to person, place, and time Cognitive/Intellectual Conditions Management [RPT]: None reported or documented in medical history or problem list   Health Maintenance Behaviors: Annual physical exam Healing Pattern: Average  Neurological Neurological Review of Symptoms: Numbness Neurological Management Strategies: Routine screening, Medication therapy Neurological Self-Management Outcome: 3 (uncertain) Neurological Comment: reports numbness and pain to both legs when cold air hits them  HEENT HEENT Symptoms Reported: Not assessed      Cardiovascular Cardiovascular Symptoms Reported: No symptoms reported    Respiratory Respiratory Symptoms Reported: No symptoms reported    Endocrine Endocrine Symptoms Reported: No symptoms reported    Gastrointestinal Gastrointestinal  Symptoms Reported: Change in appetite, Unintentional weight loss Gastrointestinal Management Strategies: Diet modification (Routine Screening) Gastrointestinal Self-Management Outcome: 3 (uncertain)    Genitourinary Genitourinary Symptoms Reported: Frequency    Integumentary Integumentary Symptoms Reported: No symptoms reported    Musculoskeletal Musculoskelatal Symptoms Reviewed: Back pain, Difficulty walking Additional Musculoskeletal Details: recent lumbar MRI shows worsening multilevel degenerative changes, patient is scheduled to see Orthopedics on 08/01/23; patient is working with in home PT Musculoskeletal Management Strategies: Medical device, Routine screening, Adequate rest Musculoskeletal Self-Management Outcome: 3 (uncertain) Falls in the past year?: No Number of falls in past year: 1 or less Was there an injury with Fall?: No Fall Risk Category Calculator: 0 Patient Fall Risk Level: Low Fall Risk Patient at Risk for Falls Due to: Impaired balance/gait, Impaired mobility Fall risk Follow up: Falls evaluation completed, Falls prevention discussed  Psychosocial Psychosocial Symptoms Reported: No symptoms reported   Major Change/Loss/Stressor/Fears (CP): Medical condition, self Techniques to Cope with Loss/Stress/Change: Spiritual practice(s), Diversional activities, Exercise Quality of Family Relationships: supportive, involved, helpful Do you feel physically threatened by others?: No      07/27/2023    9:40 AM  Depression screen PHQ 2/9  Decreased Interest 0  Down, Depressed, Hopeless 0  PHQ - 2 Score 0    There were no vitals filed for this visit.  Medications Reviewed Today     Reviewed by Morgan Clayborne CROME, RN (Registered Nurse) on 07/31/23 at 1152  Med List Status: <None>   Medication Order Taking? Sig Documenting Provider Last Dose Status Informant  acetaminophen  (TYLENOL ) 500 MG tablet [id number]  Take 500 mg  by mouth every 6 (six) hours as needed for mild pain  (pain score 1-3).  Patient not taking: Reported on 07/31/2023   [provider]  Active   albuterol  (VENTOLIN  HFA) 108 (90 Base) MCG/ACT inhaler [id number]  INHALE 2 PUFFS INTO THE LUNGS EVERY 6 HOURS AS NEEDED FOR SHORTNESS OF BREATH/WHEEZING Perri Ronal PARAS, MD  Active Self  ALPRAZolam  (XANAX ) 0.5 MG tablet [id number]  Take one half tab twice daily as needed for anxiety.  Patient taking differently: Take 0.25-0.5 mg by mouth 2 (two) times daily as needed for anxiety. Take one half tab twice daily as needed for anxiety.   Perri Ronal PARAS, MD  Active Self  amLODipine  (NORVASC ) 2.5 MG tablet [id number] Yes TAKE 1 TABLET BY MOUTH EVERY DAY Baxley, Ronal PARAS, MD  Active   cholestyramine light (PREVALITE) 4 g packet [id number]  Take 4 g by mouth daily. Patient is taking as needed [provider]  Active Self  cyanocobalamin  (VITAMIN B12) 1000 MCG/ML injection [id number]  INJECT 1 ML (1,000 MCG TOTAL) INTO THE MUSCLE ONCE A MONTH AS DIRECTED Baxley, Ronal PARAS, MD  Active Self  denosumab  (PROLIA ) 60 MG/ML SOSY injection [id number]  Inject 60 mg into the skin every 6 (six) months. Dx code: m81.0.  pt has appt on 05/29/23 Perri Ronal PARAS, MD  Active   esomeprazole  (NEXIUM ) 40 MG capsule [id number]  TAKE 1 CAPSULE BY MOUTH DAILY AT 12 NOON Baxley, Ronal PARAS, MD  Active   meclizine  (ANTIVERT ) 25 MG tablet [id number]  Take 1 tablet (25 mg total) by mouth 3 (three) times daily as needed for dizziness. Perri Ronal PARAS, MD  Active Self  methocarbamol  (ROBAXIN ) 500 MG tablet [id number]  TAKE 1 TABLET BY MOUTH EVERY 8 HOURS AS NEEDED FOR MUSCLE SPASMS. Perri Ronal PARAS, MD  Active   Multiple Vitamins-Minerals (MULTIVITAMIN GUMMIES WOMENS PO) [id number]  Take 1 tablet by mouth daily. [provider]  Active Self  oxandrolone  (OXANDRIN) 2.5 MG tablet [id number]  Take 2 tablets (5 mg total) by mouth 2 (two) times daily.  Patient not taking: Reported on 07/27/2023   Timmy Maude SAUNDERS, MD  Consider Medication Status and  Discontinue (Discontinued by provider)   pregabalin  (LYRICA ) 50 MG capsule [id number]  TAKE 1 CAPSULE BY MOUTH THREE TIMES A DAY Baxley, Ronal PARAS, MD  Active   rosuvastatin  (CRESTOR ) 5 MG tablet [id number] Yes TAKE 1 TABLET (5 MG TOTAL) BY MOUTH DAILY.  Patient taking differently: Take 5 mg by mouth daily. Patient is taking sporadically.   Perri Ronal PARAS, MD  Active Self            Recommendation:   Specialty provider follow-up with Dr. Lonni Poli on 08/01/23 at 2:45 PM Keep your scheduled appointment on 08/02/23 at 10:30 AM for your   US  ABDOMEN LIMITED [facility]    Follow Up Plan:   Telephone follow up appointment date/time:  Thursday, August 28 at 1:00 PM  Clayborne Ly RN BSN CCM [place]  [facility], [facility] Nurse Care Coordinator  Direct Dial: [phone] Website: [email or web address]

## 2023-07-31 NOTE — Patient Instructions (Signed)
 Visit Information  Thank you for taking time to visit with me today. Please don't hesitate to contact me if I can be of assistance to you before our next scheduled appointment.  Your next care management appointment is by telephone on Thursday, August 28 at 1:00 PM  Please call the care guide team at [phone] if you need to cancel, schedule, or reschedule an appointment.   Please call [phone]-TALK (toll free, 24 hour hotline) if you are experiencing a Mental Health or Behavioral Health Crisis or need someone to talk to.  Clayborne Ly RN BSN CCM [place]  [facility], [facility] Nurse Care Coordinator  Direct Dial: [phone] Website: [email or web address]

## 2023-08-01 ENCOUNTER — Ambulatory Visit (INDEPENDENT_AMBULATORY_CARE_PROVIDER_SITE_OTHER): Admitting: Orthopaedic Surgery

## 2023-08-01 ENCOUNTER — Other Ambulatory Visit (INDEPENDENT_AMBULATORY_CARE_PROVIDER_SITE_OTHER): Payer: Self-pay

## 2023-08-01 DIAGNOSIS — M25562 Pain in left knee: Secondary | ICD-10-CM

## 2023-08-01 DIAGNOSIS — G8929 Other chronic pain: Secondary | ICD-10-CM

## 2023-08-01 DIAGNOSIS — M25561 Pain in right knee: Secondary | ICD-10-CM

## 2023-08-01 MED ORDER — METHYLPREDNISOLONE ACETATE 40 MG/ML IJ SUSP
40.0000 mg | INTRAMUSCULAR | Status: AC | PRN
  Administered 2023-08-01: 40 mg via INTRA_ARTICULAR

## 2023-08-01 MED ORDER — LIDOCAINE HCL 1 % IJ SOLN
3.0000 mL | INTRAMUSCULAR | Status: AC | PRN
Start: 2023-08-01 — End: 2023-08-01
  Administered 2023-08-01: 3 mL

## 2023-08-01 MED ORDER — LIDOCAINE HCL 1 % IJ SOLN
3.0000 mL | INTRAMUSCULAR | Status: AC | PRN
  Administered 2023-08-01: 3 mL

## 2023-08-01 NOTE — Progress Notes (Signed)
 The patient is a very pleasant 73 year old female who is also a patient of Dr. Ronal Amble Baxley.  She is sent to us  for evaluation treatment of bilateral knee pain.  She has had a MRI recently of her lumbar spine that shows some degenerative changes.  She is in physical therapy now as relates to fall issues with working on her balance and coordination.  She has a history of bilateral shoulder replacements as well as a right hip replacement.  The right hip was replaced after she sustained a femoral neck fracture after mechanical fall back in 2016.  She says both of her knees have been hurting with the right worse than the left and they cause some throbbing pain as well.  She has never had any type of surgery on her knees.  Examination of both knees today shows no effusion.  Both knees have slight flexion contracture but good range of motion.  Both knees are stable ligamentously.  An AP and lateral both knees today shows mild to moderate arthritic findings with some slight medial joint space narrowing and patellofemoral narrowing.  I did recommend a steroid injection in both knees today which she agreed to.  She is likely a candidate at some point for hyaluronic acid in her knees.  She will continue therapy for her balance and coordination.  She did tolerate steroid injections well in both her knees.  Will see her back in a month to see how she is doing overall.  She may be a candidate eventually for hyaluronic acid.    Procedure Note  Patient: Erika Murphy             Date of Birth: [birth year]           MRN: [id number]             Visit Date: 08/01/2023  Procedures: Visit Diagnoses:  1. Chronic pain of left knee   2. Chronic pain of right knee     Large Joint Inj: R knee on 08/01/2023 3:04 PM Indications: diagnostic evaluation and pain Details: 22 G 1.5 in needle, superolateral approach  Arthrogram: No  Medications: 3 mL lidocaine  1 %; 40 mg methylPREDNISolone  acetate 40 MG/ML Outcome:  tolerated well, no immediate complications Procedure, treatment alternatives, risks and benefits explained, specific risks discussed. Consent was given by the patient. Immediately prior to procedure a time out was called to verify the correct patient, procedure, equipment, support staff and site/side marked as required. Patient was prepped and draped in the usual sterile fashion.    Large Joint Inj: L knee on 08/01/2023 3:04 PM Indications: diagnostic evaluation and pain Details: 22 G 1.5 in needle, superolateral approach  Arthrogram: No  Medications: 3 mL lidocaine  1 %; 40 mg methylPREDNISolone  acetate 40 MG/ML Outcome: tolerated well, no immediate complications Procedure, treatment alternatives, risks and benefits explained, specific risks discussed. Consent was given by the patient. Immediately prior to procedure a time out was called to verify the correct patient, procedure, equipment, support staff and site/side marked as required. Patient was prepped and draped in the usual sterile fashion.

## 2023-08-02 ENCOUNTER — Ambulatory Visit (HOSPITAL_COMMUNITY)
Admission: RE | Admit: 2023-08-02 | Discharge: 2023-08-02 | Disposition: A | Discharge status: Home / Self Care | Source: Ambulatory Visit | Attending: Hematology & Oncology | Admitting: Hematology & Oncology

## 2023-08-02 DIAGNOSIS — D002 Carcinoma in situ of stomach: Secondary | ICD-10-CM | POA: Diagnosis not present

## 2023-08-02 DIAGNOSIS — R7989 Other specified abnormal findings of blood chemistry: Secondary | ICD-10-CM | POA: Diagnosis not present

## 2023-08-02 DIAGNOSIS — Z85028 Personal history of other malignant neoplasm of stomach: Secondary | ICD-10-CM | POA: Diagnosis not present

## 2023-08-07 DIAGNOSIS — M810 Age-related osteoporosis without current pathological fracture: Secondary | ICD-10-CM | POA: Diagnosis not present

## 2023-08-07 DIAGNOSIS — M8580 Other specified disorders of bone density and structure, unspecified site: Secondary | ICD-10-CM | POA: Diagnosis not present

## 2023-08-07 DIAGNOSIS — I10 Essential (primary) hypertension: Secondary | ICD-10-CM | POA: Diagnosis not present

## 2023-08-07 DIAGNOSIS — N319 Neuromuscular dysfunction of bladder, unspecified: Secondary | ICD-10-CM | POA: Diagnosis not present

## 2023-08-07 DIAGNOSIS — G35 Multiple sclerosis: Secondary | ICD-10-CM | POA: Diagnosis not present

## 2023-08-07 DIAGNOSIS — K589 Irritable bowel syndrome without diarrhea: Secondary | ICD-10-CM | POA: Diagnosis not present

## 2023-08-07 DIAGNOSIS — K219 Gastro-esophageal reflux disease without esophagitis: Secondary | ICD-10-CM | POA: Diagnosis not present

## 2023-08-07 DIAGNOSIS — M797 Fibromyalgia: Secondary | ICD-10-CM | POA: Diagnosis not present

## 2023-08-07 DIAGNOSIS — J45909 Unspecified asthma, uncomplicated: Secondary | ICD-10-CM | POA: Diagnosis not present

## 2023-08-08 ENCOUNTER — Ambulatory Visit: Payer: Self-pay | Admitting: Hematology & Oncology

## 2023-08-10 ENCOUNTER — Other Ambulatory Visit: Payer: Self-pay | Admitting: Pharmacist

## 2023-08-13 DIAGNOSIS — K589 Irritable bowel syndrome without diarrhea: Secondary | ICD-10-CM | POA: Diagnosis not present

## 2023-08-13 DIAGNOSIS — M797 Fibromyalgia: Secondary | ICD-10-CM | POA: Diagnosis not present

## 2023-08-13 DIAGNOSIS — G35 Multiple sclerosis: Secondary | ICD-10-CM | POA: Diagnosis not present

## 2023-08-13 DIAGNOSIS — N319 Neuromuscular dysfunction of bladder, unspecified: Secondary | ICD-10-CM | POA: Diagnosis not present

## 2023-08-13 DIAGNOSIS — J45909 Unspecified asthma, uncomplicated: Secondary | ICD-10-CM | POA: Diagnosis not present

## 2023-08-13 DIAGNOSIS — I10 Essential (primary) hypertension: Secondary | ICD-10-CM | POA: Diagnosis not present

## 2023-08-13 DIAGNOSIS — M810 Age-related osteoporosis without current pathological fracture: Secondary | ICD-10-CM | POA: Diagnosis not present

## 2023-08-13 DIAGNOSIS — M8580 Other specified disorders of bone density and structure, unspecified site: Secondary | ICD-10-CM | POA: Diagnosis not present

## 2023-08-13 DIAGNOSIS — K219 Gastro-esophageal reflux disease without esophagitis: Secondary | ICD-10-CM | POA: Diagnosis not present

## 2023-08-15 ENCOUNTER — Inpatient Hospital Stay: Attending: Hematology & Oncology | Admitting: Dietician

## 2023-08-15 DIAGNOSIS — D509 Iron deficiency anemia, unspecified: Secondary | ICD-10-CM | POA: Insufficient documentation

## 2023-08-15 DIAGNOSIS — R634 Abnormal weight loss: Secondary | ICD-10-CM | POA: Insufficient documentation

## 2023-08-15 DIAGNOSIS — D510 Vitamin B12 deficiency anemia due to intrinsic factor deficiency: Secondary | ICD-10-CM | POA: Insufficient documentation

## 2023-08-15 DIAGNOSIS — R109 Unspecified abdominal pain: Secondary | ICD-10-CM | POA: Insufficient documentation

## 2023-08-15 DIAGNOSIS — G35 Multiple sclerosis: Secondary | ICD-10-CM | POA: Insufficient documentation

## 2023-08-15 DIAGNOSIS — R519 Headache, unspecified: Secondary | ICD-10-CM | POA: Insufficient documentation

## 2023-08-15 DIAGNOSIS — K219 Gastro-esophageal reflux disease without esophagitis: Secondary | ICD-10-CM | POA: Insufficient documentation

## 2023-08-15 DIAGNOSIS — Z85028 Personal history of other malignant neoplasm of stomach: Secondary | ICD-10-CM | POA: Insufficient documentation

## 2023-08-15 DIAGNOSIS — M791 Myalgia, unspecified site: Secondary | ICD-10-CM | POA: Insufficient documentation

## 2023-08-15 DIAGNOSIS — Z7983 Long term (current) use of bisphosphonates: Secondary | ICD-10-CM | POA: Insufficient documentation

## 2023-08-15 DIAGNOSIS — Z79899 Other long term (current) drug therapy: Secondary | ICD-10-CM | POA: Insufficient documentation

## 2023-08-15 NOTE — Progress Notes (Signed)
 Nutrition Follow Up: Reached out to patient at home telephone#.  Patient reports feeling well, eating better, sleeping better with less pain since having steroid injections, bowels moving.  She also reports cooking more for herself. Using Fair life BID Started eating some trail mix with pecans and raisins.  Cooking more, fried chicken wings, mashed potato, and waffles with syrup, Spam and fried eggs,  Anthropometrics: weight down 2#   Height: 64 Weight:  07/27/23  81# 06/22/23  82# 06/08/23 83#  UBW: 95-100 (2 years ago) BMI: 13.9  NUTRITION DIAGNOSIS:  Food and Nutrition Related Knowledge Deficit related to cancer and associated treatments as evidenced by no prior need for nutrition related information.   INTERVENTION:  Encouraged continued attention to her fluid intake, suggested she increase calories with diluted low acid juices (apple or grape) instead of one  bottle of water   Encouraged using Fairlife BID  Encouraged patient to reach out if she has concerns.  MONITORING, EVALUATION, GOAL: weight, PO intake, Nutrition Impact Symptoms, labs Goal is weight maintenance  Next Visit: PRN at patient or provider request  Micheline Craven, RDN, LDN Registered Dietitian, [facility] Part Time Remote (Usual office hours: Tuesday-Thursday) Cell: [phone]

## 2023-08-15 NOTE — Progress Notes (Signed)
 Nutrition Follow Up: Reached out to patient at home telephone#.  Patient reports feeling well, eating better, sleeping better with less pain since having steroid injections, bowels moving.  She also reports cooking more for herself. Using Fair life BID Started eating some trail mix with pecans and raisins.  Cooking more, fried chicken wings, mashed potato, and waffles with syrup, Spam and fried eggs,  Anthropometrics: weight down 2#   Height: 64 Weight:  07/27/23  81# 06/22/23  82# 06/08/23 83#  UBW: 95-100 (2 years ago) BMI: 13.9  NUTRITION DIAGNOSIS:  Food and Nutrition Related Knowledge Deficit related to cancer and associated treatments as evidenced by no prior need for nutrition related information.   INTERVENTION:  Encouraged continued attention to her fluid intake, suggested she increase calories with diluted low acid juices (apple or grape) instead of one  bottle of water   Encouraged using Fairlife BID  Encouraged patient to reach out if she has concerns.  MONITORING, EVALUATION, GOAL: weight, PO intake, Nutrition Impact Symptoms, labs Goal is weight maintenance  Next Visit: PRN at patient or provider request  Micheline Craven, RDN, LDN Registered Dietitian, [facility] Part Time Remote (Usual office hours: Tuesday-Thursday) Cell: [phone]

## 2023-08-16 ENCOUNTER — Other Ambulatory Visit: Payer: Self-pay

## 2023-08-16 ENCOUNTER — Encounter: Payer: Self-pay | Admitting: Hematology & Oncology

## 2023-08-16 ENCOUNTER — Emergency Department (HOSPITAL_BASED_OUTPATIENT_CLINIC_OR_DEPARTMENT_OTHER)
Admission: EM | Admit: 2023-08-16 | Discharge: 2023-08-16 | Disposition: A | Discharge status: Home / Self Care | Source: Ambulatory Visit

## 2023-08-16 ENCOUNTER — Other Ambulatory Visit: Payer: Self-pay | Admitting: Internal Medicine

## 2023-08-16 ENCOUNTER — Emergency Department (HOSPITAL_BASED_OUTPATIENT_CLINIC_OR_DEPARTMENT_OTHER)

## 2023-08-16 ENCOUNTER — Encounter (HOSPITAL_BASED_OUTPATIENT_CLINIC_OR_DEPARTMENT_OTHER): Payer: Self-pay | Admitting: Emergency Medicine

## 2023-08-16 DIAGNOSIS — Z85028 Personal history of other malignant neoplasm of stomach: Secondary | ICD-10-CM | POA: Diagnosis not present

## 2023-08-16 DIAGNOSIS — D72819 Decreased white blood cell count, unspecified: Secondary | ICD-10-CM | POA: Diagnosis not present

## 2023-08-16 DIAGNOSIS — R591 Generalized enlarged lymph nodes: Secondary | ICD-10-CM | POA: Diagnosis not present

## 2023-08-16 DIAGNOSIS — R102 Pelvic and perineal pain: Secondary | ICD-10-CM | POA: Diagnosis not present

## 2023-08-16 DIAGNOSIS — R634 Abnormal weight loss: Secondary | ICD-10-CM | POA: Insufficient documentation

## 2023-08-16 DIAGNOSIS — I10 Essential (primary) hypertension: Secondary | ICD-10-CM | POA: Diagnosis not present

## 2023-08-16 DIAGNOSIS — R1013 Epigastric pain: Secondary | ICD-10-CM | POA: Insufficient documentation

## 2023-08-16 DIAGNOSIS — R0602 Shortness of breath: Secondary | ICD-10-CM | POA: Diagnosis not present

## 2023-08-16 DIAGNOSIS — E041 Nontoxic single thyroid nodule: Secondary | ICD-10-CM | POA: Diagnosis not present

## 2023-08-16 DIAGNOSIS — R109 Unspecified abdominal pain: Secondary | ICD-10-CM

## 2023-08-16 DIAGNOSIS — Z79899 Other long term (current) drug therapy: Secondary | ICD-10-CM | POA: Insufficient documentation

## 2023-08-16 LAB — COMPREHENSIVE METABOLIC PANEL WITH GFR
ALT: 42 U/L (ref 0–44)
AST: 39 U/L (ref 15–41)
Albumin: 4 g/dL (ref 3.5–5.0)
Alkaline Phosphatase: 69 U/L (ref 38–126)
Anion gap: 10 (ref 5–15)
BUN: 29 mg/dL — ABNORMAL HIGH (ref 8–23)
CO2: 24 mmol/L (ref 22–32)
Calcium: 9.7 mg/dL (ref 8.9–10.3)
Chloride: 107 mmol/L (ref 98–111)
Creatinine, Ser: 1.32 mg/dL — ABNORMAL HIGH (ref 0.44–1.00)
GFR, Estimated: 42 mL/min — ABNORMAL LOW (ref >60)
Glucose, Bld: 83 mg/dL (ref 70–99)
Potassium: 4.6 mmol/L (ref 3.5–5.1)
Sodium: 141 mmol/L (ref 135–145)
Total Bilirubin: 0.6 mg/dL (ref 0.0–1.2)
Total Protein: 6.9 g/dL (ref 6.5–8.1)

## 2023-08-16 LAB — LIPASE, BLOOD: Lipase: 25 U/L (ref 11–51)

## 2023-08-16 LAB — CBC WITH DIFFERENTIAL/PLATELET
Abs Immature Granulocytes: 0.02 K/uL (ref 0.00–0.07)
Basophils Absolute: 0 K/uL (ref 0.0–0.1)
Basophils Relative: 1 %
Eosinophils Absolute: 0 K/uL (ref 0.0–0.5)
Eosinophils Relative: 1 %
HCT: 38.6 % (ref 36.0–46.0)
Hemoglobin: 12.4 g/dL (ref 12.0–15.0)
Immature Granulocytes: 1 %
Lymphocytes Relative: 24 %
Lymphs Abs: 0.9 K/uL (ref 0.7–4.0)
MCH: 30.5 pg (ref 26.0–34.0)
MCHC: 32.1 g/dL (ref 30.0–36.0)
MCV: 95.1 fL (ref 80.0–100.0)
Monocytes Absolute: 0.3 K/uL (ref 0.1–1.0)
Monocytes Relative: 9 %
Neutro Abs: 2.5 K/uL (ref 1.7–7.7)
Neutrophils Relative %: 64 %
Platelets: 158 K/uL (ref 150–400)
RBC: 4.06 MIL/uL (ref 3.87–5.11)
RDW: 12.5 % (ref 11.5–15.5)
WBC: 3.8 K/uL — ABNORMAL LOW (ref 4.0–10.5)
nRBC: 0 % (ref 0.0–0.2)

## 2023-08-16 LAB — URINALYSIS, MICROSCOPIC (REFLEX)

## 2023-08-16 LAB — URINALYSIS, ROUTINE W REFLEX MICROSCOPIC
Bilirubin Urine: NEGATIVE
Glucose, UA: NEGATIVE mg/dL
Hgb urine dipstick: NEGATIVE
Ketones, ur: NEGATIVE mg/dL
Nitrite: NEGATIVE
Protein, ur: NEGATIVE mg/dL
Specific Gravity, Urine: 1.015 (ref 1.005–1.030)
pH: 5.5 (ref 5.0–8.0)

## 2023-08-16 LAB — TROPONIN T, HIGH SENSITIVITY: Troponin T High Sensitivity: <15 ng/L (ref 0–19)

## 2023-08-16 MED ORDER — IOHEXOL 350 MG/ML SOLN
100.0000 mL | Freq: Once | INTRAVENOUS | Status: AC | PRN
  Administered 2023-08-16: 75 mL via INTRAVENOUS

## 2023-08-16 NOTE — Telephone Encounter (Signed)
 Message received from patient stating that she is having an increase in abdominal pain and diarrhea, is down to 76 pounds and would like to know if she can move her appt scheduled in September up earlier.   Dr. Timmy notified.  Call placed back to patient and patient notified per Dr. Timmy that he would like her to have a CT scan of the abdomen and pelvis.  Pt states that she does not wish to wait for the CT scan to be scheduled and is going to go to the ER now.  Dr. Timmy notified.

## 2023-08-16 NOTE — ED Notes (Signed)
 Patient transported to CT

## 2023-08-16 NOTE — ED Provider Notes (Signed)
 [facility] Provider Note   CSN: [id number] Arrival date & time: 08/16/23  1305     Patient presents with: Abdominal Pain and Shortness of Breath   Erika Murphy is a 73 y.o. female.   This is a 73 year old female presenting emergency department for abdominal pain.  Center of abdomen.  Reports it feels like she is being kicked in the stomach.  No associated nausea or vomiting.  Had some crampy pain yesterday and large bowel movement.  Has not had a bowel movement today, still passing gas.  She notes that for the past several weeks as well she has also had some shortness of breath and dyspnea on exertion.  No chest pain.   Abdominal Pain Associated symptoms: shortness of breath   Shortness of Breath Associated symptoms: abdominal pain        Prior to Admission medications   Medication Sig Start Date End Date Taking? Authorizing Provider  acetaminophen  (TYLENOL ) 500 MG tablet Take 500 mg by mouth every 6 (six) hours as needed for mild pain (pain score 1-3). Patient not taking: Reported on 07/31/2023    [provider]  albuterol  (VENTOLIN  HFA) 108 (90 Base) MCG/ACT inhaler INHALE 2 PUFFS INTO THE LUNGS EVERY 6 HOURS AS NEEDED FOR SHORTNESS OF BREATH/WHEEZING 07/04/22   Perri Ronal PARAS, MD  ALPRAZolam  (XANAX ) 0.5 MG tablet Take one half tab twice daily as needed for anxiety. Patient taking differently: Take 0.25-0.5 mg by mouth 2 (two) times daily as needed for anxiety. Take one half tab twice daily as needed for anxiety. 06/08/21   Perri Ronal PARAS, MD  amLODipine  (NORVASC ) 2.5 MG tablet TAKE 1 TABLET BY MOUTH EVERY DAY 01/01/23   Perri Ronal PARAS, MD  cholestyramine light (PREVALITE) 4 g packet Take 4 g by mouth daily. Patient is taking as needed 08/24/22   [provider]  cyanocobalamin  (VITAMIN B12) 1000 MCG/ML injection INJECT 1 ML (1,000 MCG TOTAL) INTO THE MUSCLE ONCE A MONTH AS DIRECTED 01/02/22   Perri Ronal PARAS, MD  denosumab   (PROLIA ) 60 MG/ML SOSY injection Inject 60 mg into the skin every 6 (six) months. Dx code: m81.0.  pt has appt on 05/29/23 05/24/23   Perri Ronal PARAS, MD  esomeprazole  (NEXIUM ) 40 MG capsule TAKE 1 CAPSULE BY MOUTH DAILY AT 12 NOON 01/01/23   Perri Ronal PARAS, MD  meclizine  (ANTIVERT ) 25 MG tablet Take 1 tablet (25 mg total) by mouth 3 (three) times daily as needed for dizziness. 12/22/19   Perri Ronal PARAS, MD  methocarbamol  (ROBAXIN ) 500 MG tablet TAKE 1 TABLET BY MOUTH EVERY 8 HOURS AS NEEDED FOR MUSCLE SPASMS. 05/14/23   Perri Ronal PARAS, MD  Multiple Vitamins-Minerals (MULTIVITAMIN GUMMIES WOMENS PO) Take 1 tablet by mouth daily.    [provider]  oxandrolone  (OXANDRIN) 2.5 MG tablet Take 2 tablets (5 mg total) by mouth 2 (two) times daily. Patient not taking: Reported on 07/27/2023 06/22/23   Timmy Maude SAUNDERS, MD  pregabalin  (LYRICA ) 50 MG capsule TAKE 1 CAPSULE BY MOUTH THREE TIMES A DAY 08/17/23   Perri Ronal PARAS, MD  rosuvastatin  (CRESTOR ) 5 MG tablet TAKE 1 TABLET (5 MG TOTAL) BY MOUTH DAILY. Patient taking differently: Take 5 mg by mouth daily. Patient is taking sporadically. 08/14/22   Perri Ronal PARAS, MD    Allergies: Grass pollen([id number] vern)    Review of Systems  Respiratory:  Positive for shortness of breath.   Gastrointestinal:  Positive for abdominal  pain.    Updated Vital Signs BP 118/64 (BP Location: Right Arm)   Pulse 70   Temp 98.3 F (36.8 C) (Oral)   Resp 18   Ht 5' 5 (1.651 m)   Wt 34.5 kg   SpO2 99%   BMI 12.65 kg/m   Physical Exam Vitals reviewed.  Constitutional:      Comments: Cachectic  Cardiovascular:     Rate and Rhythm: Normal rate and regular rhythm.  Pulmonary:     Effort: Pulmonary effort is normal.     Breath sounds: Normal breath sounds.  Abdominal:     General: Abdomen is flat.     Palpations: Abdomen is soft.     Tenderness: There is abdominal tenderness in the epigastric area and periumbilical area.     Hernia: No hernia is  present.  Neurological:     General: No focal deficit present.  Psychiatric:        Mood and Affect: Mood normal.        Behavior: Behavior normal.     (all labs ordered are listed, but only abnormal results are displayed) Labs Reviewed  URINALYSIS, ROUTINE W REFLEX MICROSCOPIC - Abnormal; Notable for the following components:      Result Value   Leukocytes,Ua TRACE (*)    All other components within normal limits  COMPREHENSIVE METABOLIC PANEL WITH GFR - Abnormal; Notable for the following components:   BUN 29 (*)    Creatinine, Ser 1.32 (*)    GFR, Estimated 42 (*)    All other components within normal limits  CBC WITH DIFFERENTIAL/PLATELET - Abnormal; Notable for the following components:   WBC 3.8 (*)    All other components within normal limits  URINALYSIS, MICROSCOPIC (REFLEX) - Abnormal; Notable for the following components:   Bacteria, UA FEW (*)    All other components within normal limits  LIPASE, BLOOD  TROPONIN T, HIGH SENSITIVITY    EKG: EKG Interpretation Date/Time:  Thursday August 16 2023 13:20:10 EDT Ventricular Rate:  88 PR Interval:  155 QRS Duration:  85 QT Interval:  352 QTC Calculation: 426 R Axis:   94  Text Interpretation: Sinus rhythm Biatrial enlargement Right axis deviation Confirmed by Neysa Clap [id number]) on 08/16/2023 1:35:17 PM  Radiology: CT Angio Chest PE W and/or Wo Contrast Result Date: 08/16/2023 CLINICAL DATA:  High probability for PE. Abdominal pain and shortness of breath. EXAM: CT ANGIOGRAPHY CHEST WITH CONTRAST TECHNIQUE: Multidetector CT imaging of the chest was performed using the standard protocol during bolus administration of intravenous contrast. Multiplanar CT image reconstructions and MIPs were obtained to evaluate the vascular anatomy. RADIATION DOSE REDUCTION: This exam was performed according to the departmental dose-optimization program which includes automated exposure control, adjustment of the mA and/or kV according  to patient size and/or use of iterative reconstruction technique. CONTRAST:  75mL OMNIPAQUE  IOHEXOL  350 MG/ML SOLN COMPARISON:  CT chest 08/18/2022. FINDINGS: Cardiovascular: Satisfactory opacification of the pulmonary arteries to the segmental level. No evidence of pulmonary embolism. Normal heart size. No pericardial effusion. Mediastinum/Nodes: There is a heterogeneous nodule in the right thyroid  measuring 2.2 cm. There are multiple nonenlarged paratracheal, prevascular, AP window and right hilar lymph nodes. There is an enlarged subcarinal lymph node measuring 10 mm. Visualized esophagus is within normal limits. Lungs/Pleura: There is scarring in both lung apices. The lungs are otherwise clear. There is no pleural effusion or pneumothorax. Upper Abdomen: No acute abnormality. Musculoskeletal: No acute osseous abnormality. Bilateral shoulder arthroplasties are present. Review of  the MIP images confirms the above findings. IMPRESSION: 1. No evidence for pulmonary embolism. 2. Stable right thyroid  nodule measuring 2.2 cm. Please see dedicated thyroid  ultrasound for further recommendations from 09/29/2022. 3. Mild mediastinal and right hilar lymphadenopathy of uncertain etiology. Electronically Signed   By: Greig Pique M.D.   On: 08/16/2023 16:36   CT ABDOMEN PELVIS W CONTRAST Result Date: 08/16/2023 CLINICAL DATA:  Suprapubic abdominal pain. EXAM: CT ABDOMEN AND PELVIS WITH CONTRAST TECHNIQUE: Multidetector CT imaging of the abdomen and pelvis was performed using the standard protocol following bolus administration of intravenous contrast. RADIATION DOSE REDUCTION: This exam was performed according to the departmental dose-optimization program which includes automated exposure control, adjustment of the mA and/or kV according to patient size and/or use of iterative reconstruction technique. CONTRAST:  75mL OMNIPAQUE  IOHEXOL  350 MG/ML SOLN COMPARISON:  08/15/2022 FINDINGS: Lower chest: No acute findings  Hepatobiliary: No focal hepatic abnormality. Small soft tissue density area within the gallbladder lumen may reflect small stones or sludge. No evidence of acute cholecystitis or biliary ductal dilatation. Pancreas: No focal abnormality or ductal dilatation. Spleen: No focal abnormality.  Normal size. Adrenals/Urinary Tract: Adrenal glands normal. Small subcentimeter cysts in the right kidney are unchanged and compatible with benign cysts. No follow-up imaging recommended. No stones or hydronephrosis. Urinary bladder unremarkable. Stomach/Bowel: Postoperative changes in the stomach. No evidence of bowel obstruction or inflammatory process. Vascular/Lymphatic: Aortic atherosclerosis. No evidence of aneurysm or adenopathy. Reproductive: Obscured by beam hardening artifact from the right hip replacement. No visible pelvic mass. Other: No free fluid or free air. Musculoskeletal: No acute bony abnormality. Changes of right hip replacement. IMPRESSION: No acute findings in the abdomen or pelvis. Electronically Signed   By: Franky Crease M.D.   On: 08/16/2023 16:35     Procedures   Medications Ordered in the ED  iohexol  (OMNIPAQUE ) 350 MG/ML injection 100 mL (75 mLs Intravenous Contrast Given 08/16/23 1526)    Clinical Course as of 08/17/23 1608  Thu Aug 16, 2023  1508 Received sign out from Dr. Neysa pending CT scan. Hx of gastric cancer in remission with worsening SHOB, dyspnea, abdominal pain.  [WS]  1706 Imaging shows thyroid  nodule, nonspecific mediastinal lymphadenopathy, no PE, no intra-abdominal process.  Patient currently feeling well, feels comfortable going home.  Labs also with possible borderline AKI, discussed increase fluid intake.  Recommended follow-up with primary physician for incidental findings which I have discussed with the patient. Will discharge patient to home. All questions answered. Patient comfortable with plan of discharge. Return precautions discussed with patient and specified on  the after visit summary.  [WS]    Clinical Course User Index [WS] Francesca Elsie CROME, MD                                 Medical Decision Making 73 year old female with complicated past medical history to include MS, stomach cancer in remission, fibromyalgia, hypertension and anemia presenting to the emergency department for abdominal pain.  She is afebrile and nontachycardic.  Lungs are clear on exam.  Abdominal exam soft with some minor tenderness.  She is nontoxic-appearing.  Will get screening labs.  Given her history of stomach cancer will get CT abdomen.  Concern for possible PE as well, will get CTA.  Care signed out to afternoon team.  Disposition pending imaging.  Amount and/or Complexity of Data Reviewed Independent Historian:     Details: For member's note continues  to lose weight External Data Reviewed:     Details: Per her oncology note last month Principle Diagnosis:   Stage II (T3N0M0) adenocarcinoma of the stomach-remission Recurrent iron  deficiency anemia  Pernicious anemia Multiple sclerosis Current Therapy:         IV iron  as indicated -- Venofer  was last given on 07/21/2022  Vitamin B-12 1 mg IM every month --done at home  Prolia  60 mg IM q 6 months -- next dose in 05/2023 at primary care provider's office  Labs: ordered. Decision-making details documented in ED Course. Radiology: ordered and independent interpretation performed. Decision-making details documented in ED Course. ECG/medicine tests: independent interpretation performed.  Risk Prescription drug management. Decision regarding hospitalization. Diagnosis or treatment significantly limited by social determinants of health. Risk Details: Poor health literacy       Final diagnoses:  Abdominal pain, unspecified abdominal location  Weight loss    ED Discharge Orders     None          Neysa Caron PARAS, DO 08/17/23 1608

## 2023-08-16 NOTE — ED Triage Notes (Signed)
 Pt reports suprapubic ABD pain x 3 days, US  of stomach 2 weeks ago, also c/o ShoB  Denies n/v/d, cough, congestion, fever, constipation, or dysuria  LBM 08/15/23

## 2023-08-16 NOTE — Discharge Instructions (Addendum)
 We evaluated you for your abdominal pain and weight loss.  Your testing in the emergency department did not show any dangerous cause of your symptoms.  Your CT scan showed some small enlarged lymph nodes in your chest, please discuss this with Dr. Timmy.  We did not see signs of any infection or other dangerous process.  Your symptoms also may be related to gallstones, and we would recommend that you can follow-up with a general surgeon to discuss this.  You can follow-up with [facility].  If you have any new or worsening symptoms such as severe pain, lightheadedness or dizziness, fainting, fevers or chills, or any other new symptoms, please return to the emergency department

## 2023-08-16 NOTE — ED Notes (Signed)
 ED Provider at bedside.

## 2023-08-16 NOTE — ED Notes (Signed)
 Asked patient for urine sample. Pt did not have to use the bathroom at the moment but stated she would try to shortly.

## 2023-08-16 NOTE — ED Provider Notes (Signed)
    ED Course / MDM   Clinical Course as of 08/16/23 1712  Thu Aug 16, 2023  1508 Received sign out from Dr. Neysa pending CT scan. Hx of gastric cancer in remission with worsening SHOB, dyspnea, abdominal pain.  [WS]  1706 Imaging shows thyroid  nodule, nonspecific mediastinal lymphadenopathy, no PE, no intra-abdominal process.  Patient currently feeling well, feels comfortable going home.  Labs also with possible borderline AKI, discussed increase fluid intake.  Recommended follow-up with primary physician for incidental findings which I have discussed with the patient. Will discharge patient to home. All questions answered. Patient comfortable with plan of discharge. Return precautions discussed with patient and specified on the after visit summary.  [WS]    Clinical Course User Index [WS] Francesca Elsie CROME, MD   Medical Decision Making Amount and/or Complexity of Data Reviewed Labs: ordered. Radiology: ordered.  Risk Prescription drug management.      Francesca Elsie CROME, MD 08/16/23 [phone]

## 2023-08-17 ENCOUNTER — Encounter: Payer: Self-pay | Admitting: Internal Medicine

## 2023-08-17 ENCOUNTER — Ambulatory Visit (INDEPENDENT_AMBULATORY_CARE_PROVIDER_SITE_OTHER): Admitting: Internal Medicine

## 2023-08-17 ENCOUNTER — Telehealth: Payer: Self-pay

## 2023-08-17 VITALS — BP 120/60 | HR 82 | Temp 98.4°F | Wt 76.0 lb

## 2023-08-17 DIAGNOSIS — R634 Abnormal weight loss: Secondary | ICD-10-CM | POA: Diagnosis not present

## 2023-08-17 DIAGNOSIS — R1013 Epigastric pain: Secondary | ICD-10-CM | POA: Diagnosis not present

## 2023-08-17 DIAGNOSIS — E041 Nontoxic single thyroid nodule: Secondary | ICD-10-CM | POA: Diagnosis not present

## 2023-08-17 NOTE — Telephone Encounter (Signed)
 Received phone call from patient who stated she was evaluated in the ER yesterday with all negative scans. Pt states she is continuing to have abdominal pain. Pt states that while in the ER she did not receive compassionate care stating they told me they don't deal with cancer patients down there and I was supposed to go to [employer]. Pt states her daughter kept asking questions and it seemed like the provider didn't want to answer our questions  Dr. Timmy aware and plan made to move patient appointment up from September to next week.  Pt educated that if she has pain that is severe and does not go away to be evaluated again by the ER over the weekend.  Pt verbalized understanding and pt transferred to scheduling to move her appointment up. Pt appreciative of phone call and plan of action.

## 2023-08-17 NOTE — Progress Notes (Signed)
 Patient Care Team: Perri Ronal PARAS, MD as PCP - General (Internal Medicine) Timmy Maude SAUNDERS, MD as Consulting Physician (Oncology) Morgan Clayborne CROME, RN as [facility] Care Management  Visit Date: 08/17/23  Subjective:   Chief Complaint  Patient presents with   Weight Loss   ER follow up    Patient c/o low abdominal pain off and on for a few days. Patient went to the ER yesterday for abdominal pain and weight loss, patient would like to go over CT results. Discuss crestor .    Patient PI:Wpwj K Rudin,Female DOB:1950-08-06,73 y.o. FMW:[id number]   73 y.o.Female presents today with her daughter, accompanied by for ED follow-up from 08/16/2023. Patient has a past medical history of iron  deficiency anemia, osteoporosis ,Multiple sclerosis, spastic gait disorder with lower distal extremity weakness and proximal weakness. B12 normal in May. Prealbumin low-normal at 20 on 07/27/2023. Since 07/27/2023, when she weighed 81 lbs, she has lost 5 pounds and today weighs 76 lbsBMI 12.65. Gallbladder US  08/02/2023 did not show evidence of acute cholecystitis, however there was an area of 3 mm possible gallstone. Seen yesterday in ED for Abdominal Pain. On presentation Calcium , Sodium, Potassium, Protein, Lipase and Glucose normal.  No anemia, despite chronically low WBC. Creatinine of 1.32 on 08/16/2023 - apparently was not give any IV fluids. In June kidney functions were normal. Reportedly doesn't drink much, consists of 1 cup of coffee, 7-Up, and [place].  Last year, she had some left-sided abdominal pain in 08/2022, which prompted her to present to the ED. Subsequently underwent an upper endoscopy by Dr. Belvie Just at [facility] where diffuse moderate inflammation w/ erythema and friability found in gastric fundus and body; patent Biliroth II gastrojejunostomy visualized w/ a possible polyp at the anastomosis margin. Pathology determined Reactive Gastropathy w/ Gastric Xanthelasma (no H. Pylori identified and no  intestinal metaplasia detected) and Fragments of Pyloric Gland Adenoma w/ High-Grade Dysplasia (no malignancy or H. Pylori identified and no intestinal metaplasia detected).  Repeat Endoscopy in November w/ a single 2 mm mucosal papule w/o bleeding or stigmata found in gastric body (gastric hyperplastic polyp per pathology; no dysplasia, malignancy, or H. Pylori identified and no intestinal metaplasia detected) and diffusely severe erythematous mucosa w/ bleeding on contact found in entire examined stomach (reactive gastropathy per pathology; no dysplasia, malignancy, or H. Pylori identified and no intestinal metaplasia detected). Following this she was placed on Nexium  40 mg daily.   Describes diet usually consisting of: Oatmeal, Eggs, Elner Lehmann, Spam, Sausage, Spaghetti, Fair Life; has not really changed, she just can not tolerate large portions, so snacks throughout the day. Did apparently speak to a nutritionist on the phone who gave her some suggestions.    Says that lately her stomach has been bothering her with pain left-sided that will occasionally wrap around to her back. Past Medical History:  Diagnosis Date   Anxiety    Arthritis    right shoulder (10/09/2013)   Asthma    pt denies   B12 deficiency anemia    pt denies   Fibromyalgia    GERD (gastroesophageal reflux disease)    History of blood transfusion 2001   when I had my stomach cancer   Hypertension    IBS (irritable bowel syndrome)    Iron  deficiency anemia 12/11/2014   Malabsorption of iron  12/11/2014   MS (multiple sclerosis) (HCC) dx'd ~ 1998   Neurogenic bladder    due to MS   Osteopenia    Osteoporosis  PONV (postoperative nausea and vomiting)    Stomach cancer (HCC) 2001   Thyroid  nodule    right    Allergies  Allergen Reactions   Grass Pollen(K-O-R-T-Swt Vern) Cough   Immunization History  Administered Date(s) Administered   Fluad Quad(high Dose 65+) 12/09/2021   Influenza Split 11/03/2010    Influenza,inj,Quad PF,6+ Mos 10/17/2012, 09/25/2013, 10/15/2014, 12/02/2015, 10/03/2016, 11/26/2017, 10/11/2018, 09/10/2020   Influenza,inj,quad, With Preservative 10/03/2018   Influenza-Unspecified 09/25/2013   PFIZER Comirnaty(Gray Top)Covid-19 Tri-Sucrose Vaccine 05/27/2020   PFIZER(Purple Top)SARS-COV-2 Vaccination 03/02/2019, 03/31/2019, 10/18/2019   Pneumococcal Conjugate-13 10/28/2013   Pneumococcal Polysaccharide-23 10/25/2000   Tdap 07/04/2011   Past Surgical History:  Procedure Laterality Date   BIOPSY THYROID  Right 09/02/1988   CESAREAN SECTION  1975; 1984   COLONOSCOPY WITH PROPOFOL  N/A 05/04/2017   Procedure: COLONOSCOPY WITH PROPOFOL ;  Surgeon: Rollin Dover, MD;  Location: [facility];  Service: Endoscopy;  Laterality: N/A;   ESOPHAGOGASTRODUODENOSCOPY (EGD) WITH PROPOFOL  N/A 05/04/2017   Procedure: ESOPHAGOGASTRODUODENOSCOPY (EGD) WITH PROPOFOL ;  Surgeon: Rollin Dover, MD;  Location: [facility];  Service: Endoscopy;  Laterality: N/A;   OMENTECTOMY  01/03/1999   partial   PARTIAL GASTRECTOMY  01/03/1999   REVERSE SHOULDER ARTHROPLASTY Right 05/27/2021   Procedure: REVERSE SHOULDER ARTHROPLASTY;  Surgeon: Kay Kemps, MD;  Location: [facility];  Service: Orthopedics;  Laterality: Right;  with ISB   TONSILLECTOMY  ~ 1959   TOTAL HIP ARTHROPLASTY Right 08/18/2014   Procedure: TOTAL HIP ARTHROPLASTY ANTERIOR APPROACH;  Surgeon: Redell Shoals, MD;  Location: [facility];  Service: Orthopedics;  Laterality: Right;   TOTAL SHOULDER ARTHROPLASTY Left 01/02/2009    Family History  Problem Relation Age of Onset   Heart disease Mother    Diabetes Father    Stomach cancer Brother    Multiple sclerosis Other    Breast cancer Sister        2 sisters breast cancer    Breast cancer Maternal Aunt    SocialHx: resides alone. Retired from [employer]. Daughter is with her today.  Review of Systems  Constitutional:  Positive for weight loss. Negative for fever and malaise/fatigue.        (+) low appetite   HENT:  Negative for congestion.   Eyes:  Negative for blurred vision.  Respiratory:  Negative for cough and shortness of breath.   Cardiovascular:  Negative for chest pain, palpitations and leg swelling.  Gastrointestinal:  Negative for vomiting.  Musculoskeletal:  Negative for back pain.  Skin:  Negative for rash.  Neurological:  Negative for loss of consciousness and headaches.     Objective:  Vitals: BP 120/60   Pulse 82   Temp 98.4 F (36.9 C)   Wt 76 lb (34.5 kg)   SpO2 97%   BMI 12.65 kg/m   Physical Exam Vitals and nursing note reviewed.  Constitutional:      General: She is not in acute distress.    Appearance: Normal appearance. She is underweight. She is not toxic-appearing.  HENT:     Head: Normocephalic and atraumatic.  Pulmonary:     Effort: Pulmonary effort is normal.  Abdominal:     Tenderness: There is abdominal tenderness (mild) in the epigastric area.  Skin:    General: Skin is warm and dry.  Neurological:     Mental Status: She is alert and oriented to person, place, and time. Mental status is at baseline.  Psychiatric:        Mood and Affect: Mood normal.  Behavior: Behavior normal.        Thought Content: Thought content normal.        Judgment: Judgment normal.     Results:  Studies Obtained And Personally Reviewed By Me:  US  Abdomen Limited 08/08/2023 COMPARISON:  August 15, 2022.  FINDINGS:  Gallbladder: There is a small echogenic area within the gallbladder fundus measuring 3 mm which may reflect a small cholelithiasis versus sludge ball. No definitive wall thickening. No sonographic Murphy sign noted by sonographer.   Common bile duct: Diameter: Visualized portion measures 2 mm, within normal limits. Majority is not visualized. Liver: No focal lesion identified. Portions are suboptimally assessed secondary to shadowing bowel gas. Within normal limits in parenchymal echogenicity. Portal vein is patent on color  Doppler imaging with normal direction of blood flow towards the liver.   Other: None.   IMPRESSION:  1. There is a small echogenic area within the gallbladder fundus measuring 3 mm which may reflect a small cholelithiasis versus sludge ball. No definitive sonographic evidence of acute cholecystitis.   2. Given history of malignancy, if there is a persistent clinical concern for metastatic disease, this would be better assessed with contrast enhanced CT or MR.   CT Angio Chest PE W and/or Wo Contrast 08/16/2023 COMPARISON:  CT chest 08/18/2022.  FINDINGS:  Cardiovascular: Satisfactory opacification of the pulmonary arteries to the segmental level. No evidence of pulmonary embolism. Normal heart size. No pericardial effusion.   Mediastinum/Nodes: There is a heterogeneous nodule in the right thyroid  measuring 2.2 cm. There are multiple nonenlarged paratracheal, prevascular, AP window and right hilar lymph nodes. There is an enlarged subcarinal lymph node measuring 10 mm. Visualized esophagus is within normal limits.   Lungs/Pleura: There is scarring in both lung apices. The lungs are otherwise clear. There is no pleural effusion or pneumothorax.   Upper Abdomen: No acute abnormality.   Musculoskeletal: No acute osseous abnormality. Bilateral shoulder arthroplasties are present. Review of the MIP images confirms the above findings.   IMPRESSION:  1. No evidence for pulmonary embolism.   2. Stable right thyroid  nodule measuring 2.2 cm. Please see dedicated thyroid  ultrasound for further recommendations from 09/29/2022.   3. Mild mediastinal and right hilar lymphadenopathy of uncertain etiology.   CT ABDOMEN PELVIS W CONTRAST 08/16/2023 COMPARISON:  08/15/2022  FINDINGS:  Lower chest: No acute findings   Hepatobiliary: No focal hepatic abnormality. Small soft tissue density area within the gallbladder lumen may reflect small stones or sludge. No evidence of acute cholecystitis or biliary  ductal dilatation.   Pancreas: No focal abnormality or ductal dilatation.   Spleen: No focal abnormality.  Normal size.   Adrenals/Urinary Tract: Adrenal glands normal. Small subcentimeter cysts in the right kidney are unchanged and compatible with benign cysts. No follow-up imaging recommended. No stones or hydronephrosis. Urinary bladder unremarkable.   Stomach/Bowel: Postoperative changes in the stomach. No evidence of bowel obstruction or inflammatory process.   Vascular/Lymphatic: Aortic atherosclerosis. No evidence of aneurysm or adenopathy.   Reproductive: Obscured by beam hardening artifact from the right hip replacement. No visible pelvic mass.   Other: No free fluid or free air.   Musculoskeletal: No acute bony abnormality. Changes of right hip replacement.   IMPRESSION: No acute findings in the abdomen or pelvis.   Labs:  CBC w/ Differential Lab Results  Component Value Date   WBC 3.8 (L) 08/16/2023   RBC 4.06 08/16/2023   HGB 12.4 08/16/2023   HCT 38.6 08/16/2023   PLT 158 08/16/2023  MCV 95.1 08/16/2023   MCH 30.5 08/16/2023   MCHC 32.1 08/16/2023   RDW 12.5 08/16/2023   MPV 11.2 03/09/2023   LYMPHSABS 0.9 08/16/2023   MONOABS 0.3 08/16/2023   BASOSABS 0.0 08/16/2023    Comprehensive Metabolic Panel Lab Results  Component Value Date   NA 141 08/16/2023   K 4.6 08/16/2023   CL 107 08/16/2023   CO2 24 08/16/2023   GLUCOSE 83 08/16/2023   BUN 29 (H) 08/16/2023   CREATININE 1.32 (H) 08/16/2023   CALCIUM  9.7 08/16/2023   PROT 6.9 08/16/2023   ALBUMIN 4.0 08/16/2023   AST 39 08/16/2023   ALT 42 08/16/2023   ALKPHOS 69 08/16/2023   BILITOT 0.6 08/16/2023   EGFR 63 03/09/2023   GFRNONAA 42 (L) 08/16/2023   Lipid Panel  Lab Results  Component Value Date   CHOL 189 03/09/2023   HDL 70 03/09/2023   LDLCALC 99 03/09/2023   TRIG 100 03/09/2023   A1c Lab Results  Component Value Date   HGBA1C 5.0 05/13/2021    TSH Lab Results  Component Value  Date   TSH 0.570 07/27/2023   Vitamin-B12 Lab Results  Component Value Date   VITAMINB12 320 05/04/2023   Iron  Panel Lab Results  Component Value Date   IRON  114 05/04/2023   TIBC 392 05/04/2023   FERRITIN 176 05/04/2023   Hepatic Function Panel    Latest Ref Rng & Units 08/16/2023    2:24 PM 07/27/2023    9:14 AM 06/22/2023    9:14 AM  Hepatic Function  Total Protein 6.5 - 8.1 g/dL 6.9  7.7  7.3   Albumin 3.5 - 5.0 g/dL 4.0  4.4  4.2   AST 15 - 41 U/L 39  50  31   ALT 0 - 44 U/L 42  50  36   Alk Phosphatase 38 - 126 U/L 69  83  72   Total Bilirubin 0.0 - 1.2 mg/dL 0.6  0.8  0.8    No results found for any visits on 08/17/23. Assessment & Plan:   Orders Placed This Encounter  Procedures   US  THYROID     Reason for Exam (SYMPTOM  OR DIAGNOSIS REQUIRED):   evaluate 2.2 cm thyroid  nodule    Preferred imaging location?:   [facility]   [facility]    If indicated for the ordered procedure, I authorize the administration of a radiopharmaceutical per Radiology protocol:   Yes    Preferred imaging location?:   [facility]   Ambulatory referral to Gastroenterology    Referral Priority:   Urgent    Referral Type:   Consultation    Referral Reason:   Specialty Services Required    Referred to Provider:   Rollin Dover, MD    Requested Specialty:   Gastroenterology    Number of Visits Requested:   1  Thyroid  Nodule in the right thyroid  measuring 2.2 cm noted on CXR 08/16/2023. TSH 0.57.  Ordering US  Thyroid .   Weight Loss: S/p partial gastrectomy 2001. B12 normal in May. Prealbumin low-normal at 20 on 07/27/2023. Seen yesterday in ED for Abdominal Pain. On presentation Calcium , Sodium, Potassium, Protein, Lipase and Glucose normal.  No anemia, despite chronically low WBC. Diet usually consists of: Oatmeal, Eggs, Elner Lehmann, Spam, Sausage, Spaghetti, Fair Life; has not really changed, she just can not tolerate large portions, so snacks throughout the day. Did  apparently speak to a nutritionist on the phone who gave her some suggestions.  Dyspepsia: S/p partial gastrectomy 2001. Apparently lately her stomach has been bothering her with pain left-sided that will occasionally wrap around to her back. Reviewed 2 endoscopies from [facility] in 2024 when she was complaining of similar pain. At one point she was on Nexium  40 mg daily.  GI is Dr. Rollin, whom I have called to inform him of the situation and appointment has been made.     I,Emily Lagle,acting as a Neurosurgeon for Ronal JINNY Hailstone, MD.,have documented all relevant documentation on the behalf of Ronal JINNY Hailstone, MD,as directed by  Ronal JINNY Hailstone, MD while in the presence of Ronal JINNY Hailstone, MD.  I, Ronal JINNY Hailstone, MD, have reviewed all documentation for this visit. The documentation on 08/17/2023 for the exam, diagnosis, procedures, and orders are all accurate and complete.

## 2023-08-20 DIAGNOSIS — Z980 Intestinal bypass and anastomosis status: Secondary | ICD-10-CM | POA: Diagnosis not present

## 2023-08-20 DIAGNOSIS — R1033 Periumbilical pain: Secondary | ICD-10-CM | POA: Diagnosis not present

## 2023-08-20 DIAGNOSIS — Z85028 Personal history of other malignant neoplasm of stomach: Secondary | ICD-10-CM | POA: Diagnosis not present

## 2023-08-20 DIAGNOSIS — K2930 Chronic superficial gastritis without bleeding: Secondary | ICD-10-CM | POA: Diagnosis not present

## 2023-08-21 ENCOUNTER — Inpatient Hospital Stay
Admission: RE | Admit: 2023-08-21 | Discharge: 2023-08-21 | Discharge status: Home / Self Care | Source: Ambulatory Visit | Attending: Internal Medicine | Admitting: Internal Medicine

## 2023-08-21 DIAGNOSIS — E041 Nontoxic single thyroid nodule: Secondary | ICD-10-CM | POA: Diagnosis not present

## 2023-08-24 ENCOUNTER — Inpatient Hospital Stay: Admitting: Family

## 2023-08-24 ENCOUNTER — Other Ambulatory Visit: Payer: Self-pay | Admitting: Family

## 2023-08-24 ENCOUNTER — Inpatient Hospital Stay

## 2023-08-24 VITALS — BP 131/67 | HR 73 | Temp 98.1°F | Resp 17 | Ht 65.0 in | Wt 75.1 lb

## 2023-08-24 DIAGNOSIS — D002 Carcinoma in situ of stomach: Secondary | ICD-10-CM | POA: Diagnosis not present

## 2023-08-24 DIAGNOSIS — G35 Multiple sclerosis: Secondary | ICD-10-CM | POA: Diagnosis not present

## 2023-08-24 DIAGNOSIS — R634 Abnormal weight loss: Secondary | ICD-10-CM | POA: Diagnosis not present

## 2023-08-24 DIAGNOSIS — M791 Myalgia, unspecified site: Secondary | ICD-10-CM | POA: Diagnosis not present

## 2023-08-24 DIAGNOSIS — R109 Unspecified abdominal pain: Secondary | ICD-10-CM | POA: Diagnosis not present

## 2023-08-24 DIAGNOSIS — D509 Iron deficiency anemia, unspecified: Secondary | ICD-10-CM | POA: Diagnosis not present

## 2023-08-24 DIAGNOSIS — Z85028 Personal history of other malignant neoplasm of stomach: Secondary | ICD-10-CM | POA: Diagnosis not present

## 2023-08-24 DIAGNOSIS — D510 Vitamin B12 deficiency anemia due to intrinsic factor deficiency: Secondary | ICD-10-CM | POA: Diagnosis not present

## 2023-08-24 DIAGNOSIS — Z79899 Other long term (current) drug therapy: Secondary | ICD-10-CM | POA: Diagnosis not present

## 2023-08-24 DIAGNOSIS — R519 Headache, unspecified: Secondary | ICD-10-CM | POA: Diagnosis not present

## 2023-08-24 DIAGNOSIS — K219 Gastro-esophageal reflux disease without esophagitis: Secondary | ICD-10-CM | POA: Diagnosis not present

## 2023-08-24 DIAGNOSIS — Z7983 Long term (current) use of bisphosphonates: Secondary | ICD-10-CM | POA: Diagnosis not present

## 2023-08-24 LAB — LACTATE DEHYDROGENASE: LDH: 161 U/L (ref 98–192)

## 2023-08-24 LAB — CBC WITH DIFFERENTIAL (CANCER CENTER ONLY)
Abs Immature Granulocytes: 0.02 K/uL (ref 0.00–0.07)
Basophils Absolute: 0 K/uL (ref 0.0–0.1)
Basophils Relative: 1 %
Eosinophils Absolute: 0.1 K/uL (ref 0.0–0.5)
Eosinophils Relative: 2 %
HCT: 39.3 % (ref 36.0–46.0)
Hemoglobin: 12.9 g/dL (ref 12.0–15.0)
Immature Granulocytes: 1 %
Lymphocytes Relative: 33 %
Lymphs Abs: 1 K/uL (ref 0.7–4.0)
MCH: 30.7 pg (ref 26.0–34.0)
MCHC: 32.8 g/dL (ref 30.0–36.0)
MCV: 93.6 fL (ref 80.0–100.0)
Monocytes Absolute: 0.2 K/uL (ref 0.1–1.0)
Monocytes Relative: 8 %
Neutro Abs: 1.7 K/uL (ref 1.7–7.7)
Neutrophils Relative %: 55 %
Platelet Count: 149 K/uL — ABNORMAL LOW (ref 150–400)
RBC: 4.2 MIL/uL (ref 3.87–5.11)
RDW: 12.1 % (ref 11.5–15.5)
WBC Count: 3 K/uL — ABNORMAL LOW (ref 4.0–10.5)
nRBC: 0 % (ref 0.0–0.2)

## 2023-08-24 LAB — CMP (CANCER CENTER ONLY)
ALT: 34 U/L (ref 0–44)
AST: 37 U/L (ref 15–41)
Albumin: 4.4 g/dL (ref 3.5–5.0)
Alkaline Phosphatase: 63 U/L (ref 38–126)
Anion gap: 11 (ref 5–15)
BUN: 26 mg/dL — ABNORMAL HIGH (ref 8–23)
CO2: 24 mmol/L (ref 22–32)
Calcium: 9.6 mg/dL (ref 8.9–10.3)
Chloride: 107 mmol/L (ref 98–111)
Creatinine: 1.18 mg/dL — ABNORMAL HIGH (ref 0.44–1.00)
GFR, Estimated: 48 mL/min — ABNORMAL LOW (ref >60)
Glucose, Bld: 91 mg/dL (ref 70–99)
Potassium: 4.9 mmol/L (ref 3.5–5.1)
Sodium: 142 mmol/L (ref 135–145)
Total Bilirubin: 0.7 mg/dL (ref 0.0–1.2)
Total Protein: 7.7 g/dL (ref 6.5–8.1)

## 2023-08-24 LAB — TSH: TSH: 2.28 u[IU]/mL (ref 0.350–4.500)

## 2023-08-24 MED ORDER — MEGESTROL ACETATE 400 MG/10ML PO SUSP: 400.0000 mg | Freq: Two times a day (BID) | ORAL | 2 refills | Status: DC

## 2023-08-24 NOTE — Progress Notes (Signed)
 Hematology and Oncology Follow Up Visit  Erika Murphy [id number] 09/02/1950 73 y.o. 08/24/2023   Principle Diagnosis:  Stage II (T3N0M0) adenocarcinoma of the stomach-remission Recurrent iron  deficiency anemia  Pernicious anemia Multiple sclerosis  Current Therapy:        IV iron  as indicated Vitamin B-12 1 mg IM every month -- done at home Prolia  60 mg IM q 6 months -- with primary care provider's office   Interim History:  Erika Murphy is here today for early visit with granddaughter for c/o persistent abdominal pain, weight loss, right sided headaches and lower extremity aches and pains.  She states that she is scheduled next week for EGD to assess for cause of weight loss GERD and abdominal pain. CT of abdomen and pelvis was negative. CT angio showed stable thyroid  nodule and mild mediastinal and right hilar lymphadenopathy of uncertain etiology.  She has some tenderness over the right chest wall. No abnormality palpated on exam.  No adenopathy noted.  She is scheduled for HIDA scan Monday and has already had an US  of the thyroid  and result is pending.  She is taking Nexium  and also says she is taking Ascend daily for GERD.  She has a dry cough with the reflux.  She states that Neuro feels the pain in her right side is from sciatica.  She has received steroid injections in both knees.  She is down 10 lbs since last 5 months at 75 lbs.  She states that she has added fair life protein drinks to her diet and is trying to eat and hydrate more throughout the day.   She has not noted any blood loss. No bruising or petechiae.  No fever, chills, n/v, rash, dizziness, SOB, chest pain, palpitations or changes in bowel or bladder habits.  No swelling noted in her extremities. She wears compression stockings on both legs to help with pain.  No falls or syncope reported.  She is using a walker when ambulating for added support.   ECOG Performance Status: 1 - Symptomatic but completely  ambulatory  Medications:  Allergies as of 08/24/2023       Reactions   Grass Pollen(k-o-r-t-swt Vern) Cough        Medication List        Accurate as of August 24, 2023 10:55 AM. If you have any questions, ask your nurse or doctor.          acetaminophen  500 MG tablet Commonly known as: TYLENOL  Take 500 mg by mouth every 6 (six) hours as needed for mild pain (pain score 1-3).   albuterol  108 (90 Base) MCG/ACT inhaler Commonly known as: VENTOLIN  HFA INHALE 2 PUFFS INTO THE LUNGS EVERY 6 HOURS AS NEEDED FOR SHORTNESS OF BREATH/WHEEZING   ALPRAZolam  0.5 MG tablet Commonly known as: Xanax  Take one half tab twice daily as needed for anxiety. What changed:  how much to take how to take this when to take this reasons to take this   amLODipine  2.5 MG tablet Commonly known as: NORVASC  TAKE 1 TABLET BY MOUTH EVERY DAY   cholestyramine light 4 g packet Commonly known as: PREVALITE Take 4 g by mouth daily. Patient is taking as needed   cyanocobalamin  1000 MCG/ML injection Commonly known as: VITAMIN B12 INJECT 1 ML (1,000 MCG TOTAL) INTO THE MUSCLE ONCE A MONTH AS DIRECTED   esomeprazole  40 MG capsule Commonly known as: NEXIUM  TAKE 1 CAPSULE BY MOUTH DAILY AT 12 NOON   meclizine  25 MG tablet Commonly known  as: ANTIVERT  Take 1 tablet (25 mg total) by mouth 3 (three) times daily as needed for dizziness.   methocarbamol  500 MG tablet Commonly known as: ROBAXIN  TAKE 1 TABLET BY MOUTH EVERY 8 HOURS AS NEEDED FOR MUSCLE SPASMS.   MULTIVITAMIN GUMMIES WOMENS PO Take 1 tablet by mouth daily.   oxandrolone  2.5 MG tablet Commonly known as: OXANDRIN Take 2 tablets (5 mg total) by mouth 2 (two) times daily.   pregabalin  50 MG capsule Commonly known as: LYRICA  TAKE 1 CAPSULE BY MOUTH THREE TIMES A DAY   Prolia  60 MG/ML Sosy injection Generic drug: denosumab  Inject 60 mg into the skin every 6 (six) months. Dx code: m81.0.  pt has appt on 05/29/23   rosuvastatin  5 MG  tablet Commonly known as: CRESTOR  TAKE 1 TABLET (5 MG TOTAL) BY MOUTH DAILY. What changed: additional instructions        Allergies:  Allergies  Allergen Reactions   Grass Pollen(K-O-R-T-Swt Vern) Cough    Past Medical History, Surgical history, Social history, and Family History were reviewed and updated.  Review of Systems: All other 10 point review of systems is negative.   Physical Exam:  height is 5' 5 (1.651 m) and weight is 75 lb 1.9 oz (34.1 kg). Her oral temperature is 98.1 F (36.7 C). Her blood pressure is 131/67 and her pulse is 73. Her respiration is 17 and oxygen  saturation is 100%.   Wt Readings from Last 3 Encounters:  08/24/23 75 lb 1.9 oz (34.1 kg)  08/17/23 76 lb (34.5 kg)  08/16/23 76 lb (34.5 kg)    Ocular: Sclerae unicteric, pupils equal, round and reactive to light Ear-nose-throat: Oropharynx clear, dentition fair Lymphatic: No cervical or supraclavicular adenopathy Lungs no rales or rhonchi, good excursion bilaterally Heart regular rate and rhythm, no murmur appreciated Abd soft, nontender, positive bowel sounds MSK no focal spinal tenderness, no joint edema Neuro: non-focal, well-oriented, appropriate affect Breasts: Deferred   Lab Results  Component Value Date   WBC 3.0 (L) 08/24/2023   HGB 12.9 08/24/2023   HCT 39.3 08/24/2023   MCV 93.6 08/24/2023   PLT 149 (L) 08/24/2023   Lab Results  Component Value Date   FERRITIN 176 05/04/2023   IRON  114 05/04/2023   TIBC 392 05/04/2023   UIBC 278 05/04/2023   IRONPCTSAT 29 05/04/2023   Lab Results  Component Value Date   RETICCTPCT 1.0 05/04/2023   RBC 4.20 08/24/2023   RETICCTABS 45.7 06/12/2014   No results found for: KPAFRELGTCHN, LAMBDASER, KAPLAMBRATIO No results found for: IGGSERUM, IGA, IGMSERUM No results found for: Erika Murphy, Erika Murphy   Chemistry      Component Value Date/Time   NA 141  08/16/2023 1424   NA 144 06/12/2016 1151   NA 142 06/11/2015 1156   K 4.6 08/16/2023 1424   K 4.7 06/12/2016 1151   K 4.3 06/11/2015 1156   CL 107 08/16/2023 1424   CL 107 06/12/2016 1151   CO2 24 08/16/2023 1424   CO2 32 06/12/2016 1151   CO2 29 06/11/2015 1156   BUN 29 (H) 08/16/2023 1424   BUN 11 06/12/2016 1151   BUN 13.5 06/11/2015 1156   CREATININE 1.32 (H) 08/16/2023 1424   CREATININE 1.03 (H) 07/27/2023 0914   CREATININE 1.07 (H) 05/29/2023 1127   CREATININE 1.2 (H) 06/11/2015 1156      Component Value Date/Time   CALCIUM  9.7 08/16/2023 1424   CALCIUM  9.6 06/12/2016 1151   CALCIUM   9.7 06/11/2015 1156   ALKPHOS 69 08/16/2023 1424   ALKPHOS 97 (H) 06/12/2016 1151   ALKPHOS 111 06/11/2015 1156   AST 39 08/16/2023 1424   AST 50 (H) 07/27/2023 0914   AST 30 06/11/2015 1156   ALT 42 08/16/2023 1424   ALT 50 (H) 07/27/2023 0914   ALT 26 06/12/2016 1151   ALT 23 06/11/2015 1156   BILITOT 0.6 08/16/2023 1424   BILITOT 0.8 07/27/2023 0914   BILITOT 1.03 06/11/2015 1156       Impression and Plan: Ms. Cardon is a 73 yo [place] female with remote history of stage II stomach cancer. She underwent resection followed by adjuvant therapy. She is now over 23 years out from treatment.  She is having some issues with abdominal pain, weight loss and body aches and pains.  She was able to have the [facility] hepatobiliary scan which showed delayed filling of the gallbladder and may indicate cholecystitis. She an EGD coming up and appointment with Dr. Rollin for further eval.  We will get her onto Megace  to help increase her appetite and weight gain.  Follow-up in 2 months.   Lauraine Pepper, NP 8/22/202510:55 AM

## 2023-08-26 NOTE — Patient Instructions (Signed)
 Have ordered thyroid  ultrasound. Have contacted Dr. Curtis office for an appt about weight loss.

## 2023-08-27 ENCOUNTER — Ambulatory Visit: Payer: Self-pay | Admitting: Internal Medicine

## 2023-08-27 ENCOUNTER — Encounter (HOSPITAL_COMMUNITY)
Admission: RE | Admit: 2023-08-27 | Discharge: 2023-08-27 | Disposition: A | Discharge status: Home / Self Care | Source: Ambulatory Visit | Attending: Internal Medicine | Admitting: Internal Medicine

## 2023-08-27 DIAGNOSIS — R634 Abnormal weight loss: Secondary | ICD-10-CM | POA: Insufficient documentation

## 2023-08-27 DIAGNOSIS — K8050 Calculus of bile duct without cholangitis or cholecystitis without obstruction: Secondary | ICD-10-CM | POA: Diagnosis not present

## 2023-08-27 DIAGNOSIS — R1013 Epigastric pain: Secondary | ICD-10-CM | POA: Insufficient documentation

## 2023-08-27 MED ORDER — MORPHINE SULFATE (PF) 2 MG/ML IV SOLN
2.0000 mg | Freq: Once | INTRAVENOUS | Status: AC
  Administered 2023-08-27: 2 mg via INTRAVENOUS

## 2023-08-27 MED ORDER — MORPHINE SULFATE (PF) 2 MG/ML IV SOLN
INTRAVENOUS | Status: AC
  Filled 2023-08-27: qty 1

## 2023-08-27 MED ORDER — TECHNETIUM TC 99M MEBROFENIN IV KIT
5.4200 | PACK | Freq: Once | INTRAVENOUS | Status: AC
  Administered 2023-08-27: 5.42 via INTRAVENOUS

## 2023-08-29 ENCOUNTER — Encounter: Payer: Self-pay | Admitting: Orthopaedic Surgery

## 2023-08-29 ENCOUNTER — Encounter: Payer: Self-pay | Admitting: Hematology & Oncology

## 2023-08-29 ENCOUNTER — Ambulatory Visit: Admitting: Orthopaedic Surgery

## 2023-08-29 DIAGNOSIS — M25561 Pain in right knee: Secondary | ICD-10-CM | POA: Diagnosis not present

## 2023-08-29 DIAGNOSIS — M25562 Pain in left knee: Secondary | ICD-10-CM | POA: Diagnosis not present

## 2023-08-29 DIAGNOSIS — G8929 Other chronic pain: Secondary | ICD-10-CM

## 2023-08-29 NOTE — Progress Notes (Signed)
 The patient is a 73 year old patient who comes in for follow-up a month after having steroid injections in both her knees to treat inflammatory pain within her knees.  She does have mild to moderate arthritis he does walk with a arm walking cane.  She does have significant comorbidities and is dealing with some other health issues.  She does state her knees are doing better overall and seem to be better.  She may be eventually candidate for hyaluronic acid to treat the pain from the arthritis in her knees.  Both knees today showed no effusion.  Both knees have good range of motion and no significant pain today.  She knows to wait at least 3 months minimally between steroid injections.  However I do feel that she is a good candidate for hyaluronic acid for her knees if they start bothering her in the next few weeks.  She knows to call us  and let us  know and to specifically ask about hyaluronic acid injections so we can order those for her.

## 2023-08-30 ENCOUNTER — Telehealth: Payer: Self-pay

## 2023-08-30 DIAGNOSIS — Z980 Intestinal bypass and anastomosis status: Secondary | ICD-10-CM | POA: Diagnosis not present

## 2023-08-30 DIAGNOSIS — R1033 Periumbilical pain: Secondary | ICD-10-CM | POA: Diagnosis not present

## 2023-08-30 DIAGNOSIS — K319 Disease of stomach and duodenum, unspecified: Secondary | ICD-10-CM | POA: Diagnosis not present

## 2023-08-30 DIAGNOSIS — Z934 Other artificial openings of gastrointestinal tract status: Secondary | ICD-10-CM | POA: Diagnosis not present

## 2023-08-30 DIAGNOSIS — Z85028 Personal history of other malignant neoplasm of stomach: Secondary | ICD-10-CM | POA: Diagnosis not present

## 2023-08-30 DIAGNOSIS — K2951 Unspecified chronic gastritis with bleeding: Secondary | ICD-10-CM | POA: Diagnosis not present

## 2023-09-05 ENCOUNTER — Other Ambulatory Visit: Payer: Self-pay | Admitting: Internal Medicine

## 2023-09-07 ENCOUNTER — Encounter: Payer: Self-pay | Admitting: Internal Medicine

## 2023-09-13 ENCOUNTER — Telehealth: Payer: Self-pay | Admitting: Internal Medicine

## 2023-09-13 ENCOUNTER — Inpatient Hospital Stay

## 2023-09-13 ENCOUNTER — Ambulatory Visit: Admitting: Hematology & Oncology

## 2023-09-17 DIAGNOSIS — K2930 Chronic superficial gastritis without bleeding: Secondary | ICD-10-CM | POA: Diagnosis not present

## 2023-09-17 DIAGNOSIS — R1084 Generalized abdominal pain: Secondary | ICD-10-CM | POA: Diagnosis not present

## 2023-09-21 ENCOUNTER — Other Ambulatory Visit

## 2023-09-21 DIAGNOSIS — M810 Age-related osteoporosis without current pathological fracture: Secondary | ICD-10-CM

## 2023-09-21 DIAGNOSIS — E7800 Pure hypercholesterolemia, unspecified: Secondary | ICD-10-CM

## 2023-09-21 DIAGNOSIS — E041 Nontoxic single thyroid nodule: Secondary | ICD-10-CM | POA: Diagnosis not present

## 2023-09-22 LAB — HEPATIC FUNCTION PANEL
AG Ratio: 1.5 (calc) (ref 1.0–2.5)
ALT: 21 U/L (ref 6–29)
AST: 26 U/L (ref 10–35)
Albumin: 4.1 g/dL (ref 3.6–5.1)
Alkaline phosphatase (APISO): 53 U/L (ref 37–153)
Bilirubin, Direct: 0.2 mg/dL (ref 0.0–0.2)
Globulin: 2.8 g/dL (ref 1.9–3.7)
Indirect Bilirubin: 0.5 mg/dL (ref 0.2–1.2)
Total Bilirubin: 0.7 mg/dL (ref 0.2–1.2)
Total Protein: 6.9 g/dL (ref 6.1–8.1)

## 2023-09-22 LAB — LIPID PANEL
Cholesterol: 185 mg/dL (ref <200)
HDL: 76 mg/dL (ref >=50)
LDL Cholesterol (Calc): 90 mg/dL
Non-HDL Cholesterol (Calc): 109 mg/dL (ref <130)
Total CHOL/HDL Ratio: 2.4 (calc) (ref <5.0)
Triglycerides: 97 mg/dL (ref <150)

## 2023-09-22 LAB — TSH: TSH: 0.53 m[IU]/L (ref 0.40–4.50)

## 2023-09-27 NOTE — Progress Notes (Signed)
 Patient Care Team: Perri Ronal PARAS, MD as PCP - General (Internal Medicine) Timmy Maude SAUNDERS, MD as Consulting Physician (Oncology) Morgan Clayborne CROME, RN as [facility] Care Management  Visit Date: 09/28/23  Subjective:    Patient ID: Erika Murphy , Female   DOB: [birth year], 73 y.o.    MRN: [id number]   73 y.o. Female presents today for 6 month follow up.  Patient has a past medical history of Multiple sclerosis, Anxiety, Iron  deficiency, Osteoporosis, B12 deficiency, Gastric cancer.  History of weight loss. In 2001 she has a subtotal gastrectomy and partial omentectomy due to carcinoma of the stomach.   Last year, she had some left-sided abdominal pain and near syncope in 08/2022, which prompted her to present to the ED. Subsequently underwent an upper endoscopy by Dr. Belvie Just at [facility] where diffuse moderate inflammation w/ erythema and friability was found in gastric fundus and body; patent has hx  Biliroth II gastrojejunostomy  which was visualized w/ a possible polyp at the anastomosis margin. Pathology revealed Reactive Gastropathy w/ Gastric Xanthelasma (no H. Pylori identified and no intestinal metaplasia detected) and Fragments of Pyloric Gland Adenoma w/ High-Grade Dysplasia (no malignancy or H. Pylori identified and no intestinal metaplasia detected).             Repeat Endoscopy in November w/ a single 2 mm mucosal papule w/o bleeding or stigmata found in gastric body (gastric hyperplastic polyp per pathology; no dysplasia, malignancy, or H. Pylori identified and no intestinal metaplasia detected) and noted was diffusely severe erythematous mucosa w/ bleeding on contact found in entire examined stomach (reactive gastropathy per pathology; no dysplasia, malignancy, or H. Pylori identified and no intestinal metaplasia detected). Following this she was placed on Nexium  40 mg daily.   On 08/16/2023 She presented to the ED for abdominal pain and shortness of breath. A physical exam  showed her abdomen was soft but had some abdominal tenderness in the epigastric and Periumbilical areas. Labs were normal and CT was clear. She was discharged and told to make an appointment with her primary care physician.    On 08/30/23 She had an endoscopy by Dr. Just which found that she had chronic superficial gastritis and her stomach was biopsied. Biopsy revealed that she had reactive gastropathy with focal erosion. On 09/17/2023 She was prescribed hyoscyamine sulfate 0.125 mg sublingual every 4 hours for stomach cramping.  This has help tremendously. She is feeling better. Her appetite has improved and she has gained 4 pounds. No more complaint of epigastric pain. She looks well and seems much happier.   In May she was 84 pounds and decreased to 76 pounds in August and today has increased to 80 pounds.    Labs 09/21/2023 WNL  Vaccine counseling; Infleunza vaccine received today. Pneumonia and Covid 19 vaccine due.  Past Medical History:  Diagnosis Date   Anxiety    Arthritis    right shoulder (10/09/2013)   Asthma    pt denies   B12 deficiency anemia    pt denies   Fibromyalgia    GERD (gastroesophageal reflux disease)    History of blood transfusion 2001   when I had my stomach cancer   Hypertension    IBS (irritable bowel syndrome)    Iron  deficiency anemia 12/11/2014   Malabsorption of iron  12/11/2014   MS (multiple sclerosis) dx'd ~ 1998   Neurogenic bladder    due to MS   Osteopenia    Osteoporosis  PONV (postoperative nausea and vomiting)    Stomach cancer (HCC) 2001   Thyroid  nodule    right     Family History  Problem Relation Age of Onset   Heart disease Mother    Diabetes Father    Stomach cancer Brother    Multiple sclerosis Other    Breast cancer Sister        2 sisters breast cancer    Breast cancer Maternal Aunt     Social Hx: 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 Maurice, Neurologist. Nonsmoker. Does not consume alcohol .     Review of Systems  All other systems reviewed and are negative.       Objective:   Vitals: BP 108/70   Pulse 66   Ht 5' 5 (1.651 m)   Wt 80 lb (36.3 kg)   SpO2 98%   BMI 13.31 kg/m    Physical Exam Vitals and nursing note reviewed.  Constitutional:      General: She is not in acute distress.    Appearance: Normal appearance. She is not toxic-appearing.  HENT:     Head: Normocephalic and atraumatic.  Neck:     Vascular: No carotid bruit.  Cardiovascular:     Rate and Rhythm: Normal rate and regular rhythm. No extrasystoles are present.    Pulses: Normal pulses.     Heart sounds: Normal heart sounds. No murmur heard.    No friction rub. No gallop.  Pulmonary:     Effort: Pulmonary effort is normal. No respiratory distress.     Breath sounds: Normal breath sounds. No wheezing or rales.  Skin:    General: Skin is warm and dry.  Neurological:     Mental Status: She is alert and oriented to person, place, and time. Mental status is at baseline.  Psychiatric:        Mood and Affect: Mood normal.        Behavior: Behavior normal.        Thought Content: Thought content normal.        Judgment: Judgment normal.       Results:       Labs:       Component Value Date/Time   NA 142 08/24/2023 1018   NA 144 06/12/2016 1151   NA 142 06/11/2015 1156   K 4.9 08/24/2023 1018   K 4.7 06/12/2016 1151   K 4.3 06/11/2015 1156   CL 107 08/24/2023 1018   CL 107 06/12/2016 1151   CO2 24 08/24/2023 1018   CO2 32 06/12/2016 1151   CO2 29 06/11/2015 1156   GLUCOSE 91 08/24/2023 1018   GLUCOSE 88 06/12/2016 1151   BUN 26 (H) 08/24/2023 1018   BUN 11 06/12/2016 1151   BUN 13.5 06/11/2015 1156   CREATININE 1.18 (H) 08/24/2023 1018   CREATININE 1.07 (H) 05/29/2023 1127   CREATININE 1.2 (H) 06/11/2015 1156   CALCIUM  9.6 08/24/2023 1018   CALCIUM  9.6 06/12/2016 1151   CALCIUM  9.7  06/11/2015 1156   PROT 6.9 09/21/2023 1015   PROT 7.5 06/12/2016 1151   PROT 7.5 06/11/2015 1156   ALBUMIN 4.4 08/24/2023 1018   ALBUMIN 3.8 06/12/2016 1151   ALBUMIN 3.8 06/11/2015 1156   AST 26 09/21/2023 1015   AST 37 08/24/2023 1018   AST 30 06/11/2015 1156   ALT 21 09/21/2023 1015   ALT  34 08/24/2023 1018   ALT 26 06/12/2016 1151   ALT 23 06/11/2015 1156   ALKPHOS 63 08/24/2023 1018   ALKPHOS 97 (H) 06/12/2016 1151   ALKPHOS 111 06/11/2015 1156   BILITOT 0.7 09/21/2023 1015   BILITOT 0.7 08/24/2023 1018   BILITOT 1.03 06/11/2015 1156   GFRNONAA 48 (L) 08/24/2023 1018   GFRNONAA 73 01/16/2020 0949   GFRAA 85 01/16/2020 0949     Lab Results  Component Value Date   WBC 3.0 (L) 08/24/2023   HGB 12.9 08/24/2023   HCT 39.3 08/24/2023   MCV 93.6 08/24/2023   PLT 149 (L) 08/24/2023    Lab Results  Component Value Date   CHOL 185 09/21/2023   HDL 76 09/21/2023   LDLCALC 90 09/21/2023   TRIG 97 09/21/2023   CHOLHDL 2.4 09/21/2023    Lab Results  Component Value Date   HGBA1C 5.0 05/13/2021     Lab Results  Component Value Date   TSH 0.53 09/21/2023         Assessment & Plan:   Here for 6 month follow up visit. Abdominal pain markedly improved after endoscopy by Dr. Rollin and starting on Levsin. Has gained 4 pounds since August.Pathology round reactive gastropathy  Hx of hyperlipidemia treated with low dose Crestor . Lipid panel and liver functions are normal.  HTN- treated with low dose Norvasc   Hx of GI malignancy  Osteoporosis treated with Prolia   Health maintenance- received flu vaccine today  Abdominal Pain and Weight loss: In 2001 she has a subtotal gastrectomy and partial omentectomy due to carcinoma of the stomach. last year, she had some left-sided abdominal pain in 08/2022, which prompted her to present to the ED. Subsequently underwent an upper endoscopy by Dr. Belvie Rollin at [facility] where diffuse moderate inflammation w/ erythema and  friability found in gastric fundus and body; patent Biliroth II gastrojejunostomy visualized w/ a possible polyp at the anastomosis margin. Pathology determined Reactive Gastropathy w/ Gastric Xanthelasma (no H. Pylori identified and no intestinal metaplasia detected) and Fragments of Pyloric Gland Adenoma w/ High-Grade Dysplasia (no malignancy or H. Pylori identified and no intestinal metaplasia detected).             Repeat Endoscopy in November w/ a single 2 mm mucosal papule w/o bleeding or stigmata found in gastric body (gastric hyperplastic polyp per pathology; no dysplasia, malignancy, or H. Pylori identified and no intestinal metaplasia detected) and diffusely severe erythematous mucosa w/ bleeding on contact found in entire examined stomach (reactive gastropathy per pathology; no dysplasia, malignancy, or H. Pylori identified and no intestinal metaplasia detected). Following this she was placed on Nexium  40 mg daily.  On 08/16/2023 She presented to the ED for abdominal pain and shortness of breath. A physical exam showed her abdomen was soft but had some abdominal tenderness in the epigastric and Periumbilical areas. Labs were normal and CT was clear. She was discharged and told to make an appointment with her primary care physician.   On 08/30/2023 She had an endoscopy which found that she had chronic superficial gastritis and her stomach was biopsied. Biopsy revealed that she had reactive gastropathy with focal erosion. On 09/17/2023 She was prescribed hyoscyamine sulfate 0.125 mg sublingual every 4 hours for stomach cramping.   In today's visit she said that she feels much better. In May she was 84 pounds and decreased to 76 pounds in August and today has increased to 80 pounds. The goal is to be 80 pounds or above.  Labs 09/21/2023 WNL  Vaccine counseling: Infleunza vaccine received today. Pneumonia and Covid 19 vaccine due.    I,Makayla C Reid,acting as a scribe for Ronal JINNY Hailstone,  MD.,have documented all relevant documentation on the behalf of Ronal JINNY Hailstone, MD,as directed by  Ronal JINNY Hailstone, MD while in the presence of Ronal JINNY Hailstone, MD.

## 2023-09-28 ENCOUNTER — Encounter: Payer: Self-pay | Admitting: Internal Medicine

## 2023-09-28 ENCOUNTER — Ambulatory Visit: Admitting: Internal Medicine

## 2023-09-28 VITALS — BP 108/70 | HR 66 | Ht 65.0 in | Wt 80.0 lb

## 2023-09-28 DIAGNOSIS — Z23 Encounter for immunization: Secondary | ICD-10-CM | POA: Diagnosis not present

## 2023-09-28 DIAGNOSIS — I10 Essential (primary) hypertension: Secondary | ICD-10-CM | POA: Diagnosis not present

## 2023-09-28 DIAGNOSIS — Z8639 Personal history of other endocrine, nutritional and metabolic disease: Secondary | ICD-10-CM

## 2023-09-28 DIAGNOSIS — M810 Age-related osteoporosis without current pathological fracture: Secondary | ICD-10-CM

## 2023-09-28 DIAGNOSIS — E538 Deficiency of other specified B group vitamins: Secondary | ICD-10-CM | POA: Diagnosis not present

## 2023-09-28 DIAGNOSIS — R634 Abnormal weight loss: Secondary | ICD-10-CM

## 2023-09-28 DIAGNOSIS — Z85028 Personal history of other malignant neoplasm of stomach: Secondary | ICD-10-CM | POA: Diagnosis not present

## 2023-09-28 DIAGNOSIS — Z96641 Presence of right artificial hip joint: Secondary | ICD-10-CM

## 2023-09-28 DIAGNOSIS — Z903 Acquired absence of stomach [part of]: Secondary | ICD-10-CM | POA: Diagnosis not present

## 2023-09-28 DIAGNOSIS — G35 Multiple sclerosis: Secondary | ICD-10-CM | POA: Diagnosis not present

## 2023-09-28 DIAGNOSIS — K3189 Other diseases of stomach and duodenum: Secondary | ICD-10-CM | POA: Diagnosis not present

## 2023-09-28 DIAGNOSIS — E7800 Pure hypercholesterolemia, unspecified: Secondary | ICD-10-CM

## 2023-09-28 NOTE — Patient Instructions (Addendum)
 Flu vaccine given today. Covid vaccine to be obtained at pharmacy. Consider Pneumococcal vaccine soon. Return here in 6 months or as needed. We are glad you are feeling much better.

## 2023-09-30 ENCOUNTER — Encounter: Payer: Self-pay | Admitting: Internal Medicine

## 2023-10-02 ENCOUNTER — Other Ambulatory Visit: Payer: Self-pay

## 2023-10-02 NOTE — Patient Instructions (Signed)
 Visit Information  Thank you for taking time to visit with me today. Please don't hesitate to contact me if I can be of assistance to you before our next scheduled appointment.  Your next care management appointment is by telephone on Wednesday, November 5 at 1:00 PM  Please call the care guide team at [phone] if you need to cancel, schedule, or reschedule an appointment.   Please call 1-800-273-TALK (toll free, 24 hour hotline) if you are experiencing a Mental Health or Behavioral Health Crisis or need someone to talk to.  Clayborne Ly RN BSN CCM [place]  [facility], [facility] Health Nurse Care Coordinator  Direct Dial: [phone] Website: [email or web address]

## 2023-10-02 NOTE — Patient Outreach (Signed)
 Complex Care Management   Visit Note  10/02/2023  Name:  Erika Murphy MRN: [id number] DOB: [birth year]  Situation: Referral received for Complex Care Management related to  Multiple Sclerosis, low body weight, Carcinoma in situ of stomach, Osteoporosis, Reactive gastropathy, weight loss, lumbar degenerative changes with back pain, bilateral knee pain. I obtained verbal consent from Patient.  Visit completed with Patient on the phone.  Background:   Past Medical History:  Diagnosis Date   Anxiety    Arthritis    right shoulder (10/09/2013)   Asthma    pt denies   B12 deficiency anemia    pt denies   Fibromyalgia    GERD (gastroesophageal reflux disease)    History of blood transfusion 2001   when I had my stomach cancer   Hypertension    IBS (irritable bowel syndrome)    Iron  deficiency anemia 12/11/2014   Malabsorption of iron  12/11/2014   MS (multiple sclerosis) dx'd ~ 1998   Neurogenic bladder    due to MS   Osteopenia    Osteoporosis    PONV (postoperative nausea and vomiting)    Stomach cancer (HCC) 2001   Thyroid  nodule    right    Assessment: Patient Reported Symptoms:  Cognitive Cognitive Status: Alert and oriented to person, place, and time, Normal speech and language skills Cognitive/Intellectual Conditions Management [RPT]: None reported or documented in medical history or problem list   Health Maintenance Behaviors: Annual physical exam, Healthy diet Health Facilitated by: Healthy diet, Rest, Pain control  Neurological Neurological Review of Symptoms: No symptoms reported    HEENT HEENT Symptoms Reported: No symptoms reported      Cardiovascular Cardiovascular Symptoms Reported: No symptoms reported    Respiratory Respiratory Symptoms Reported: No symptoms reported    Endocrine Endocrine Symptoms Reported: No symptoms reported Is patient diabetic?: No    Gastrointestinal Gastrointestinal Symptoms Reported: Change in appetite, Unintentional weight  loss, Cramping Additional Gastrointestinal Details: patient completed recent endoscopy with 1 non-cancerous polyp removed, newly prescribed medication is improving symptoms Gastrointestinal Management Strategies: Diet modification, Medication therapy Gastrointestinal Self-Management Outcome: 4 (good)    Genitourinary Genitourinary Symptoms Reported: No symptoms reported    Integumentary Integumentary Symptoms Reported: No symptoms reported    Musculoskeletal Musculoskelatal Symptoms Reviewed: Limited mobility, Joint pain Additional Musculoskeletal Details: bilateral knee pain improved with corticosteroid injections, patient is using her Rollator when walking outdoors Musculoskeletal Management Strategies: Adequate rest, Routine screening, Medical device Musculoskeletal Self-Management Outcome: 4 (good) Falls in the past year?: No Number of falls in past year: 1 or less Was there an injury with Fall?: No Fall Risk Category Calculator: 0 Patient Fall Risk Level: Low Fall Risk Patient at Risk for Falls Due to: Impaired mobility Fall risk Follow up: Falls evaluation completed, Education provided, Falls prevention discussed  Psychosocial Psychosocial Symptoms Reported: No symptoms reported   Major Change/Loss/Stressor/Fears (CP): Denies Quality of Family Relationships: supportive, involved, helpful Do you feel physically threatened by others?: No    10/02/2023    PHQ2-9 Depression Screening   Erika Murphy interest or pleasure in doing things    Feeling down, depressed, or hopeless    PHQ-2 - Total Score    Trouble falling or staying asleep, or sleeping too much    Feeling tired or having Erika Murphy energy    Poor appetite or overeating     Feeling bad about yourself - or that you are a failure or have let yourself or your family down    Trouble concentrating on  things, such as reading the newspaper or watching television    Moving or speaking so slowly that other people could have noticed.  Or  the opposite - being so fidgety or restless that you have been moving around a lot more than usual    Thoughts that you would be better off dead, or hurting yourself in some way    PHQ2-9 Total Score    If you checked off any problems, how difficult have these problems made it for you to do your work, take care of things at home, or get along with other people    Depression Interventions/Treatment      There were no vitals filed for this visit.  Medications Reviewed Today     Reviewed by Erika Clayborne CROME, RN (Registered Nurse) on 10/02/23 at 1309  Med List Status: <None>   Medication Order Taking? Sig Documenting Provider Last Dose Status Informant  acetaminophen  (TYLENOL ) 500 MG tablet [id number]  Take 500 mg by mouth every 6 (six) hours as needed for mild pain (pain score 1-3). Provider, Historical, Murphy  Active   albuterol  (VENTOLIN  HFA) 108 (90 Base) MCG/ACT inhaler [id number]  INHALE 2 PUFFS INTO THE LUNGS EVERY 6 HOURS AS NEEDED FOR SHORTNESS OF BREATH/WHEEZING Erika Erika PARAS, Murphy  Active Self  ALPRAZolam  (XANAX ) 0.5 MG tablet [id number]  Take one half tab twice daily as needed for anxiety.  Patient taking differently: Take 0.25-0.5 mg by mouth 2 (two) times daily as needed for anxiety. Take one half tab twice daily as needed for anxiety.   Erika Erika PARAS, Murphy  Active Self  amLODipine  (NORVASC ) 2.5 MG tablet [id number]  TAKE 1 TABLET BY MOUTH EVERY DAY Erika Murphy  Active   cholestyramine light (PREVALITE) 4 g packet [id number]  Take 4 g by mouth daily. Patient is taking as needed Provider, Historical, Murphy  Active Self  cyanocobalamin  (VITAMIN B12) 1000 MCG/ML injection [id number]  INJECT 1ML INTO THE MUSCLE ONCE MONTHLY AS DIRECTED Erika Murphy  Active   denosumab  (PROLIA ) 60 MG/ML SOSY injection [id number]  Inject 60 mg into the skin every 6 (six) months. Dx code: m81.0.  pt has appt on 05/29/23 Erika Erika PARAS, Murphy  Active   esomeprazole  (NEXIUM ) 40 MG capsule [id number]  TAKE 1 CAPSULE  BY MOUTH DAILY AT 12 NOON Erika Murphy  Active   hyoscyamine (LEVSIN SL) 0.125 MG SL tablet [id number]  Take 0.125 mg by mouth every 4 (four) hours as needed for cramping. Provider, Historical, Murphy  Active   meclizine  (ANTIVERT ) 25 MG tablet [id number]  Take 1 tablet (25 mg total) by mouth 3 (three) times daily as needed for dizziness. Erika Erika PARAS, Murphy  Active Self  megestrol  (MEGACE ) 400 MG/10ML suspension [id number]  Take 10 mLs (400 mg total) by mouth 2 (two) times daily. Franchot Lauraine HERO, NP  Active   methocarbamol  (ROBAXIN ) 500 MG tablet [id number]  TAKE 1 TABLET BY MOUTH EVERY 8 HOURS AS NEEDED FOR MUSCLE SPASMS. Erika Erika PARAS, Murphy  Active   Multiple Vitamins-Minerals (MULTIVITAMIN GUMMIES WOMENS PO) [id number]  Take 1 tablet by mouth daily. Provider, Historical, Murphy  Active Self  oxandrolone  (OXANDRIN) 2.5 MG tablet [id number]  Take 2 tablets (5 mg total) by mouth 2 (two) times daily. Timmy Maude SAUNDERS, Murphy  Active   pregabalin  (LYRICA ) 50 MG capsule [id number]  TAKE 1 CAPSULE BY MOUTH THREE TIMES A DAY Erika Murphy  Active   rosuvastatin  (CRESTOR ) 5 MG  tablet [id number]  TAKE 1 TABLET (5 MG TOTAL) BY MOUTH DAILY.  Patient taking differently: Take 5 mg by mouth daily. Patient is taking sporadically.   Erika Erika PARAS, Murphy  Active Self            Recommendation:   Specialty provider follow-up with Oncology on 11/02/23 at 12:00 PM for labs/12:15 PM in person with Dr. Timmy   Follow Up Plan:   Telephone follow up appointment with social worker Tobias Moose BSW on date/time:  Friday, October 3 at 2:00 PM Telephone follow up appointment with nurse care manager on Wednesday, November 5 at 1:00 PM  Clayborne Ly RN BSN CCM [place]  [facility], [facility] Health Nurse Care Coordinator  Direct Dial: [phone] Website: [email or web address]

## 2023-10-05 ENCOUNTER — Telehealth: Payer: Self-pay | Admitting: Licensed Clinical Social Worker

## 2023-10-10 ENCOUNTER — Other Ambulatory Visit: Payer: Self-pay

## 2023-10-10 NOTE — Patient Instructions (Signed)
 Visit Information  Thank you for taking time to visit with me today. Please don't hesitate to contact me if I can be of assistance to you before our next scheduled appointment.  Our next appointment is by telephone on 10/17/23 at 11:30am Please call the care guide team at [phone] if you need to cancel or reschedule your appointment.   Following is a copy of your care plan:   Goals Addressed             This Visit's Progress    BSW VBCI Social Work Care Plan       Problems:   PCS/housekeeping  CSW Clinical Goal(s):   Over the next 30 days the Patient will work with Child psychotherapist to address concerns related to PCS/houskeeping.  Interventions:  SW will follow up with patient on Medicaid  Patient Goals/Self-Care Activities:  Patient will gather information needed for next appointment  Plan:   No recommendations at this time.        Please call the Suicide and Crisis Lifeline: 988 call the [place]  National Suicide Prevention Lifeline: 601-479-4498 or TTY: 709 191 8385 TTY 360-362-4739) to talk to a trained counselor call 1-800-273-TALK (toll free, 24 hour hotline) call 911 if you are experiencing a Mental Health or Behavioral Health Crisis or need someone to talk to.  Patient verbalizes understanding of instructions and care plan provided today and agrees to view in MyChart. Active MyChart status and patient understanding of how to access instructions and care plan via MyChart confirmed with patient.     Thersia Hoar, HEDWIG, MHA [place]  [facility] Social Worker, Population Health [phone]

## 2023-10-10 NOTE — Patient Outreach (Signed)
 Complex Care Management   Visit Note  10/10/2023  Name:  Erika Murphy MRN: [id number] DOB: 12-Aug-1950  Situation: Referral received for Complex Care Management related to SDOH Barriers:  PCS/housekeeping I obtained verbal consent from Patient.  Visit completed with Patient  on the phone  Background:   Past Medical History:  Diagnosis Date   Anxiety    Arthritis    right shoulder (10/09/2013)   Asthma    pt denies   B12 deficiency anemia    pt denies   Fibromyalgia    GERD (gastroesophageal reflux disease)    History of blood transfusion 2001   when I had my stomach cancer   Hypertension    IBS (irritable bowel syndrome)    Iron  deficiency anemia 12/11/2014   Malabsorption of iron  12/11/2014   MS (multiple sclerosis) dx'd ~ 1998   Neurogenic bladder    due to MS   Osteopenia    Osteoporosis    PONV (postoperative nausea and vomiting)    Stomach cancer (HCC) 2001   Thyroid  nodule    right    Assessment: SW completed a telephone outreach with patient, she states she would like someone to come in and assist her with cleaning. SW explained that her Medicare would not cover PCS/housekeeping services. Patient stated that she does have Medicaid but does not have her medicaid information. SW and patient agreed for a follow up call next week to give patient time to gather all Medicaid information. No other resources are needed at this time.  SDOH Interventions    Flowsheet Row Patient Outreach Telephone from 04/27/2023 in [place] [facility]PARTMENT Care Coordination from 04/10/2023 in [place] POPULATION HEALTH DEPARTMENT Care Coordination from 03/27/2023 in [facility] Care Coordination Care Coordination from 09/01/2022 in [facility] Care Coordination  SDOH Interventions      Food Insecurity Interventions Intervention Not Indicated Intervention Not Indicated Intervention Not Indicated Other (Comment)  [has FNS,   budgetting to accomodate rising prices]  Housing Interventions Intervention Not Indicated Intervention Not Indicated Intervention Not Indicated Intervention Not Indicated  Transportation Interventions -- Intervention Not Indicated -- Intervention Not Indicated  [Son assists with transportation]  Utilities Interventions Intervention Not Indicated Intervention Not Indicated Intervention Not Indicated --  Physical Activity Interventions -- -- Intervention Not Indicated --    Recommendation:   No recommendations at this time  Follow Up Plan:   Telephone follow-up 10/17/23 at 1130  Thersia Hoar, BSW, [facility]  [facility] Social Worker, Population Health [phone]

## 2023-10-15 DIAGNOSIS — R634 Abnormal weight loss: Secondary | ICD-10-CM | POA: Diagnosis not present

## 2023-10-15 DIAGNOSIS — K2930 Chronic superficial gastritis without bleeding: Secondary | ICD-10-CM | POA: Diagnosis not present

## 2023-10-15 DIAGNOSIS — R1033 Periumbilical pain: Secondary | ICD-10-CM | POA: Diagnosis not present

## 2023-10-17 ENCOUNTER — Other Ambulatory Visit: Payer: Self-pay

## 2023-10-17 NOTE — Patient Outreach (Signed)
 Complex Care Management   Visit Note  10/17/2023  Name:  Erika Murphy MRN: [id number] DOB: January 10, 1950  Situation: Referral received for Complex Care Management related to PCS I obtained verbal consent from Patient.  Visit completed with Patient  on the phone  Background:   Past Medical History:  Diagnosis Date   Anxiety    Arthritis    right shoulder (10/09/2013)   Asthma    pt denies   B12 deficiency anemia    pt denies   Fibromyalgia    GERD (gastroesophageal reflux disease)    History of blood transfusion 2001   when I had my stomach cancer   Hypertension    IBS (irritable bowel syndrome)    Iron  deficiency anemia 12/11/2014   Malabsorption of iron  12/11/2014   MS (multiple sclerosis) dx'd ~ 1998   Neurogenic bladder    due to MS   Osteopenia    Osteoporosis    PONV (postoperative nausea and vomiting)    Stomach cancer (HCC) 2001   Thyroid  nodule    right    Assessment: SW completed a telephone outreach with patient, she states she was unable to locate any information on her Medicaid number, but states she has it. SW informed patient it looks like she only has Medicare and they do not cover PCS. Patient states she is aware they do not cover PCS. Patient has no sdoh needs at this time.  SDOH Interventions    Flowsheet Row Patient Outreach Telephone from 04/27/2023 in [place] [facility]PARTMENT Care Coordination from 04/10/2023 in [place] POPULATION HEALTH DEPARTMENT Care Coordination from 03/27/2023 in [facility] Care Coordination Care Coordination from 09/01/2022 in [facility] Care Coordination  SDOH Interventions      Food Insecurity Interventions Intervention Not Indicated Intervention Not Indicated Intervention Not Indicated Other (Comment)  [has FNS,  budgetting to accomodate rising prices]  Housing Interventions Intervention Not Indicated Intervention Not Indicated Intervention Not Indicated  Intervention Not Indicated  Transportation Interventions -- Intervention Not Indicated -- Intervention Not Indicated  [Son assists with transportation]  Utilities Interventions Intervention Not Indicated Intervention Not Indicated Intervention Not Indicated --  Physical Activity Interventions -- -- Intervention Not Indicated --    Recommendation:   No recommendations at this time  Follow Up Plan:   Patient has been provided with SW contact information for any future needs  Thersia Hoar, BSW, [facility] [place]  [facility] Social Worker, Population Health [phone]

## 2023-10-17 NOTE — Patient Instructions (Signed)

## 2023-10-21 ENCOUNTER — Other Ambulatory Visit: Payer: Self-pay | Admitting: Internal Medicine

## 2023-10-23 ENCOUNTER — Other Ambulatory Visit: Payer: Self-pay | Admitting: *Deleted

## 2023-10-23 DIAGNOSIS — M810 Age-related osteoporosis without current pathological fracture: Secondary | ICD-10-CM

## 2023-10-23 MED ORDER — DENOSUMAB 60 MG/ML ~~LOC~~ SOSY
60.0000 mg | PREFILLED_SYRINGE | SUBCUTANEOUS | Status: AC
  Administered 2023-12-10: 60 mg via SUBCUTANEOUS

## 2023-11-02 ENCOUNTER — Inpatient Hospital Stay (HOSPITAL_BASED_OUTPATIENT_CLINIC_OR_DEPARTMENT_OTHER): Admitting: Family

## 2023-11-02 ENCOUNTER — Encounter: Payer: Self-pay | Admitting: Family

## 2023-11-02 ENCOUNTER — Inpatient Hospital Stay: Attending: Hematology & Oncology

## 2023-11-02 VITALS — BP 96/53 | HR 84 | Temp 98.7°F | Resp 18 | Ht 65.0 in | Wt 83.1 lb

## 2023-11-02 DIAGNOSIS — Z85028 Personal history of other malignant neoplasm of stomach: Secondary | ICD-10-CM | POA: Diagnosis not present

## 2023-11-02 DIAGNOSIS — R634 Abnormal weight loss: Secondary | ICD-10-CM

## 2023-11-02 DIAGNOSIS — D500 Iron deficiency anemia secondary to blood loss (chronic): Secondary | ICD-10-CM | POA: Diagnosis not present

## 2023-11-02 DIAGNOSIS — D509 Iron deficiency anemia, unspecified: Secondary | ICD-10-CM | POA: Diagnosis not present

## 2023-11-02 DIAGNOSIS — Z79899 Other long term (current) drug therapy: Secondary | ICD-10-CM | POA: Insufficient documentation

## 2023-11-02 DIAGNOSIS — G35D Multiple sclerosis, unspecified: Secondary | ICD-10-CM | POA: Diagnosis not present

## 2023-11-02 DIAGNOSIS — D510 Vitamin B12 deficiency anemia due to intrinsic factor deficiency: Secondary | ICD-10-CM | POA: Insufficient documentation

## 2023-11-02 DIAGNOSIS — D002 Carcinoma in situ of stomach: Secondary | ICD-10-CM

## 2023-11-02 DIAGNOSIS — K909 Intestinal malabsorption, unspecified: Secondary | ICD-10-CM | POA: Diagnosis not present

## 2023-11-02 LAB — CBC WITH DIFFERENTIAL (CANCER CENTER ONLY)
Abs Immature Granulocytes: 0.01 K/uL (ref 0.00–0.07)
Basophils Absolute: 0 K/uL (ref 0.0–0.1)
Basophils Relative: 1 %
Eosinophils Absolute: 0.1 K/uL (ref 0.0–0.5)
Eosinophils Relative: 2 %
HCT: 34.5 % — ABNORMAL LOW (ref 36.0–46.0)
Hemoglobin: 11 g/dL — ABNORMAL LOW (ref 12.0–15.0)
Immature Granulocytes: 0 %
Lymphocytes Relative: 29 %
Lymphs Abs: 0.9 K/uL (ref 0.7–4.0)
MCH: 30.7 pg (ref 26.0–34.0)
MCHC: 31.9 g/dL (ref 30.0–36.0)
MCV: 96.4 fL (ref 80.0–100.0)
Monocytes Absolute: 0.3 K/uL (ref 0.1–1.0)
Monocytes Relative: 10 %
Neutro Abs: 1.9 K/uL (ref 1.7–7.7)
Neutrophils Relative %: 58 %
Platelet Count: 155 K/uL (ref 150–400)
RBC: 3.58 MIL/uL — ABNORMAL LOW (ref 3.87–5.11)
RDW: 12.1 % (ref 11.5–15.5)
WBC Count: 3.2 K/uL — ABNORMAL LOW (ref 4.0–10.5)
nRBC: 0 % (ref 0.0–0.2)

## 2023-11-02 LAB — CMP (CANCER CENTER ONLY)
ALT: 21 U/L (ref 0–44)
AST: 29 U/L (ref 15–41)
Albumin: 4.1 g/dL (ref 3.5–5.0)
Alkaline Phosphatase: 59 U/L (ref 38–126)
Anion gap: 9 (ref 5–15)
BUN: 19 mg/dL (ref 8–23)
CO2: 26 mmol/L (ref 22–32)
Calcium: 9.4 mg/dL (ref 8.9–10.3)
Chloride: 106 mmol/L (ref 98–111)
Creatinine: 0.98 mg/dL (ref 0.44–1.00)
GFR, Estimated: 60 mL/min — ABNORMAL LOW (ref >60)
Glucose, Bld: 128 mg/dL — ABNORMAL HIGH (ref 70–99)
Potassium: 4.4 mmol/L (ref 3.5–5.1)
Sodium: 141 mmol/L (ref 135–145)
Total Bilirubin: 0.6 mg/dL (ref 0.0–1.2)
Total Protein: 7.3 g/dL (ref 6.5–8.1)

## 2023-11-02 LAB — LACTATE DEHYDROGENASE: LDH: 194 U/L — ABNORMAL HIGH (ref 98–192)

## 2023-11-02 LAB — PREALBUMIN: Prealbumin: 22 mg/dL (ref 18–38)

## 2023-11-02 LAB — TSH: TSH: <0.1 u[IU]/mL — ABNORMAL LOW (ref 0.350–4.500)

## 2023-11-02 NOTE — Progress Notes (Signed)
 Hematology and Oncology Follow Up Visit  Erika Murphy [id number] January 20, 1950 73 y.o. 11/02/2023   Principle Diagnosis:  Stage II (T3N0M0) adenocarcinoma of the stomach-remission Recurrent iron  deficiency anemia  Pernicious anemia Multiple sclerosis   Current Therapy:        IV iron  as indicated Vitamin B-12 1 mg IM every month -- done at home Prolia  60 mg IM q 6 months -- with primary care provider's office   Interim History:  Erika Murphy is here today for follow-up. She states that she had her EGD with Dr. Rollin recently. She states that he removed a benign gastric polyp and found that she had high amount of stomach acid effecting her digestion and causing pain.  She has since started taking Creon  TID as well as adding cholestyramine powder to her food and drinks. She states that her abdominal pain is much improved and not occurring as often.  Her weight is up 3 lbs since her last visit.  Minimal nausea that resolves with taking an antiemetic.  No fever, chills, vomiting, cough, rash, dizziness, SOB, chest pain, palpitations, abdominal pain or changes in bowel or bladder habits.  No swelling in her extremities.  No falls or syncope reported. She ambulates with a cane for added support.  She has not noted any blood loss. No bruising or petechiae.   ECOG Performance Status: 1 - Symptomatic but completely ambulatory  Medications:  Allergies as of 11/02/2023       Reactions   Grass Pollen(k-[id number] Vern) Cough        Medication List        Accurate as of November 02, 2023  1:02 PM. If you have any questions, ask your nurse or doctor.          acetaminophen  500 MG tablet Commonly known as: TYLENOL  Take 500 mg by mouth every 6 (six) hours as needed for mild pain (pain score 1-3).   albuterol  108 (90 Base) MCG/ACT inhaler Commonly known as: VENTOLIN  HFA INHALE 2 PUFFS INTO THE LUNGS EVERY 6 HOURS AS NEEDED FOR SHORTNESS OF BREATH/WHEEZING   ALPRAZolam  0.5 MG  tablet Commonly known as: Xanax  Take one half tab twice daily as needed for anxiety. What changed:  how much to take how to take this when to take this reasons to take this   amLODipine  2.5 MG tablet Commonly known as: NORVASC  TAKE 1 TABLET BY MOUTH EVERY DAY   cholestyramine light 4 g packet Commonly known as: PREVALITE Take 4 g by mouth daily. Patient is taking as needed   cyanocobalamin  1000 MCG/ML injection Commonly known as: VITAMIN B12 INJECT 1ML INTO THE MUSCLE ONCE MONTHLY AS DIRECTED   esomeprazole  40 MG capsule Commonly known as: NEXIUM  TAKE 1 CAPSULE BY MOUTH DAILY AT 12 NOON   hyoscyamine 0.125 MG SL tablet Commonly known as: LEVSIN SL Take 0.125 mg by mouth every 4 (four) hours as needed for cramping.   meclizine  25 MG tablet Commonly known as: ANTIVERT  Take 1 tablet (25 mg total) by mouth 3 (three) times daily as needed for dizziness.   megestrol  400 MG/10ML suspension Commonly known as: MEGACE  Take 10 mLs (400 mg total) by mouth 2 (two) times daily.   methocarbamol  500 MG tablet Commonly known as: ROBAXIN  TAKE 1 TABLET BY MOUTH EVERY 8 HOURS AS NEEDED FOR MUSCLE SPASMS.   MULTIVITAMIN GUMMIES WOMENS PO Take 1 tablet by mouth daily.   oxandrolone  2.5 MG tablet Commonly known as: OXANDRIN Take 2 tablets (5 mg total) by  mouth 2 (two) times daily.   pregabalin  50 MG capsule Commonly known as: LYRICA  TAKE 1 CAPSULE BY MOUTH THREE TIMES A DAY   Prolia  60 MG/ML Sosy injection Generic drug: denosumab  Inject 60 mg into the skin every 6 (six) months. Dx code: m81.0.  pt has appt on 05/29/23   rosuvastatin  5 MG tablet Commonly known as: CRESTOR  TAKE 1 TABLET (5 MG TOTAL) BY MOUTH DAILY.        Allergies:  Allergies  Allergen Reactions   Grass Pollen(K-O-R-T-Swt Vern) Cough    Past Medical History, Surgical history, Social history, and Family History were reviewed and updated.  Review of Systems: All other 10 point review of systems is  negative.   Physical Exam:  vitals were not taken for this visit.   Wt Readings from Last 3 Encounters:  09/28/23 80 lb (36.3 kg)  08/24/23 75 lb 1.9 oz (34.1 kg)  08/17/23 76 lb (34.5 kg)    Ocular: Sclerae unicteric, pupils equal, round and reactive to light Ear-nose-throat: Oropharynx clear, dentition fair Lymphatic: No cervical or supraclavicular adenopathy Lungs no rales or rhonchi, good excursion bilaterally Heart regular rate and rhythm, no murmur appreciated Abd soft, nontender, positive bowel sounds MSK no focal spinal tenderness, no joint edema Neuro: non-focal, well-oriented, appropriate affect Breasts: Deferred   Lab Results  Component Value Date   WBC 3.2 (L) 11/02/2023   HGB 11.0 (L) 11/02/2023   HCT 34.5 (L) 11/02/2023   MCV 96.4 11/02/2023   PLT 155 11/02/2023   Lab Results  Component Value Date   FERRITIN 176 05/04/2023   IRON  114 05/04/2023   TIBC 392 05/04/2023   UIBC 278 05/04/2023   IRONPCTSAT 29 05/04/2023   Lab Results  Component Value Date   RETICCTPCT 1.0 05/04/2023   RBC 3.58 (L) 11/02/2023   RETICCTABS 45.7 06/12/2014   No results found for: KPAFRELGTCHN, LAMBDASER, KAPLAMBRATIO No results found for: IGGSERUM, IGA, IGMSERUM No results found for: STEPHANY CARLOTA BENSON MARKEL EARLA JOANNIE DOC VICK, SPEI   Chemistry      Component Value Date/Time   NA 142 08/24/2023 1018   NA 144 06/12/2016 1151   NA 142 06/11/2015 1156   K 4.9 08/24/2023 1018   K 4.7 06/12/2016 1151   K 4.3 06/11/2015 1156   CL 107 08/24/2023 1018   CL 107 06/12/2016 1151   CO2 24 08/24/2023 1018   CO2 32 06/12/2016 1151   CO2 29 06/11/2015 1156   BUN 26 (H) 08/24/2023 1018   BUN 11 06/12/2016 1151   BUN 13.5 06/11/2015 1156   CREATININE 1.18 (H) 08/24/2023 1018   CREATININE 1.07 (H) 05/29/2023 1127   CREATININE 1.2 (H) 06/11/2015 1156      Component Value Date/Time   CALCIUM  9.6 08/24/2023 1018   CALCIUM  9.6  06/12/2016 1151   CALCIUM  9.7 06/11/2015 1156   ALKPHOS 63 08/24/2023 1018   ALKPHOS 97 (H) 06/12/2016 1151   ALKPHOS 111 06/11/2015 1156   AST 26 09/21/2023 1015   AST 37 08/24/2023 1018   AST 30 06/11/2015 1156   ALT 21 09/21/2023 1015   ALT 34 08/24/2023 1018   ALT 26 06/12/2016 1151   ALT 23 06/11/2015 1156   BILITOT 0.7 09/21/2023 1015   BILITOT 0.7 08/24/2023 1018   BILITOT 1.03 06/11/2015 1156       Impression and Plan: Erika Murphy is a 73 yo [place] female with remote history of stage II stomach cancer. She underwent resection followed by adjuvant  therapy. She is now over 23 years out from treatment.  She is feeling much better since she started Creon  and Cholestyramine Powder. Weight is improving.  Follow-up in 2 months.   Lauraine Pepper, NP 10/31/20251:02 PM

## 2023-11-05 ENCOUNTER — Encounter: Payer: Self-pay | Admitting: Radiology

## 2023-11-06 ENCOUNTER — Ambulatory Visit: Payer: Self-pay | Admitting: Internal Medicine

## 2023-11-07 ENCOUNTER — Other Ambulatory Visit: Payer: Self-pay

## 2023-11-08 NOTE — Progress Notes (Signed)
 Patient Care Team: Perri Ronal PARAS, MD as PCP - General (Internal Medicine) Timmy Maude SAUNDERS, MD as Consulting Physician (Oncology) Morgan Clayborne CROME, RN as [facility] Care Management  Visit Date: 11/09/23  Subjective:    Patient ID: Erika Murphy , Female   DOB: 11-Mar-1950, 73 y.o.    MRN: [id number]   73 y.o. Female presents today for TSH review. Patient has a past medical history of Multiple sclerosis, Anxiety, Iron  deficiency, Osteoporosis, B12 deficiency, Gastric cancer.  Her 11/02/2023 TSH was <0.100. A month ago it was 0.53. She is taking thyroid  med separate from other meds and on an empty stomach.  History of weight loss. Current weight is 82 Lbs BMI 13.65. She has gained 2 lbs since 09/28/2023. In 2001 she has a subtotal gastrectomy and partial omentectomy due to carcinoma of the stomach.   Last year, she had some left-sided abdominal pain in 08/2022, which prompted her to present to the ED. Subsequently underwent an upper endoscopy by Dr. Belvie Just at [facility] where diffuse moderate inflammation w/ erythema and friability found in gastric fundus and body; patent Biliroth II gastrojejunostomy visualized w/ a possible polyp at the anastomosis margin. Pathology determined Reactive Gastropathy w/ Gastric Xanthelasma (no H. Pylori identified and no intestinal metaplasia detected) and Fragments of Pyloric Gland Adenoma w/ High-Grade Dysplasia (no malignancy or H. Pylori identified and no intestinal metaplasia detected).             Repeat Endoscopy in November w/ a single 2 mm mucosal papule w/o bleeding or stigmata found in gastric body (gastric hyperplastic polyp per pathology; no dysplasia, malignancy, or H. Pylori identified and no intestinal metaplasia detected) and diffusely severe erythematous mucosa w/ bleeding on contact found in entire examined stomach (reactive gastropathy per pathology; no dysplasia, malignancy, or H. Pylori identified and no intestinal metaplasia detected).  Following this she was placed on Nexium  40 mg daily.     On 08/30/23 She had an endoscopy by Dr. Just which found that she had chronic superficial gastritis and her stomach was biopsied. Biopsy revealed that she had reactive gastropathy with focal erosion. On 09/17/2023 She was prescribed hyoscyamine sulfate 0.125 mg sublingual every 4 hours for stomach cramping. She is also taking Cholestyramine 4 g daily. This has helped tremendously. She is feeling better. Her appetite has improved. No more complaint of epigastric pain. She looks well and seems much happier.    Vaccine counseling: Pneumonia vaccine received today.   Past Medical History:  Diagnosis Date   Anxiety    Arthritis    right shoulder (10/09/2013)   Asthma    pt denies   B12 deficiency anemia    pt denies   Fibromyalgia    GERD (gastroesophageal reflux disease)    History of blood transfusion 2001   when I had my stomach cancer   Hypertension    IBS (irritable bowel syndrome)    Iron  deficiency anemia 12/11/2014   Malabsorption of iron  12/11/2014   MS (multiple sclerosis) dx'd ~ 1998   Neurogenic bladder    due to MS   Osteopenia    Osteoporosis    PONV (postoperative nausea and vomiting)    Stomach cancer (HCC) 2001   Thyroid  nodule    right     Family History  Problem Relation Age of Onset   Heart disease Mother    Diabetes Father    Stomach cancer Brother    Multiple sclerosis Other    Breast cancer Sister  2 sisters breast cancer    Breast cancer Maternal Aunt     Social History   Social History Narrative   Patient is retired.    Patient is married with 3 children.   Patient drinks caffeine daily.       Review of Systems  All other systems reviewed and are negative.       Objective:   Vitals: BP 102/70   Pulse 83   Ht 5' 5 (1.651 m)   Wt 82 lb (37.2 kg)   SpO2 98%   BMI 13.65 kg/m    Physical Exam Vitals and nursing note reviewed.  Constitutional:      General: She  is not in acute distress.    Appearance: Normal appearance. She is not toxic-appearing.  HENT:     Head: Normocephalic and atraumatic.  Neck:     Thyroid : No thyroid  mass, thyromegaly or thyroid  tenderness.     Vascular: No carotid bruit.  Cardiovascular:     Rate and Rhythm: Normal rate and regular rhythm. No extrasystoles are present.    Pulses: Normal pulses.     Heart sounds: Normal heart sounds. No murmur heard.    No friction rub. No gallop.  Pulmonary:     Effort: Pulmonary effort is normal. No respiratory distress.     Breath sounds: Normal breath sounds. No wheezing or rales.  Lymphadenopathy:     Cervical: No cervical adenopathy.  Skin:    General: Skin is warm and dry.  Neurological:     Mental Status: She is alert and oriented to person, place, and time. Mental status is at baseline.  Psychiatric:        Mood and Affect: Mood normal.        Behavior: Behavior normal.        Thought Content: Thought content normal.        Judgment: Judgment normal.       Results:     Labs:       Component Value Date/Time   NA 141 11/02/2023 1232   NA 144 06/12/2016 1151   NA 142 06/11/2015 1156   K 4.4 11/02/2023 1232   K 4.7 06/12/2016 1151   K 4.3 06/11/2015 1156   CL 106 11/02/2023 1232   CL 107 06/12/2016 1151   CO2 26 11/02/2023 1232   CO2 32 06/12/2016 1151   CO2 29 06/11/2015 1156   GLUCOSE 128 (H) 11/02/2023 1232   GLUCOSE 88 06/12/2016 1151   BUN 19 11/02/2023 1232   BUN 11 06/12/2016 1151   BUN 13.5 06/11/2015 1156   CREATININE 0.98 11/02/2023 1232   CREATININE 1.07 (H) 05/29/2023 1127   CREATININE 1.2 (H) 06/11/2015 1156   CALCIUM  9.4 11/02/2023 1232   CALCIUM  9.6 06/12/2016 1151   CALCIUM  9.7 06/11/2015 1156   PROT 7.3 11/02/2023 1232   PROT 7.5 06/12/2016 1151   PROT 7.5 06/11/2015 1156   ALBUMIN 4.1 11/02/2023 1232   ALBUMIN 3.8 06/12/2016 1151   ALBUMIN 3.8 06/11/2015 1156   AST 29 11/02/2023 1232   AST 30 06/11/2015 1156   ALT 21  11/02/2023 1232   ALT 26 06/12/2016 1151   ALT 23 06/11/2015 1156   ALKPHOS 59 11/02/2023 1232   ALKPHOS 97 (H) 06/12/2016 1151   ALKPHOS 111 06/11/2015 1156   BILITOT 0.6 11/02/2023 1232   BILITOT 1.03 06/11/2015 1156   GFRNONAA 60 (L) 11/02/2023 1232   GFRNONAA 73 01/16/2020 0949   GFRAA 85 01/16/2020 0949  Lab Results  Component Value Date   WBC 3.2 (L) 11/02/2023   HGB 11.0 (L) 11/02/2023   HCT 34.5 (L) 11/02/2023   MCV 96.4 11/02/2023   PLT 155 11/02/2023    Lab Results  Component Value Date   CHOL 185 09/21/2023   HDL 76 09/21/2023   LDLCALC 90 09/21/2023   TRIG 97 09/21/2023   CHOLHDL 2.4 09/21/2023    Lab Results  Component Value Date   HGBA1C 5.0 05/13/2021     Lab Results  Component Value Date   TSH <0.100 (L) 11/02/2023        Assessment & Plan:   Orders Placed This Encounter  Procedures   TSH   T4, free   Low TSH: Her 11/02/2023 TSH was <0.100. In Sept TSH was 0.53. She says she is taking med on empty stomach. Does not take other meds at same time as thyroid  replacement medication.  TSH and Free T4 ordered.    TSH  is low at 0.02 and free T4  is  normal at 1.4. Not sure why TSH is low. Free T4 is normal. Plan to repeat in 4 months each of these values. Can refer to Endocrine if necessary.  Weight loss: Current weight is 82 Lbs BMI 13.65. She has gained 2 lbs since 09/28/2023. In 2001 she has a subtotal gastrectomy and partial omentectomy due to carcinoma of the stomach.   Last year, she had some left-sided abdominal pain in 08/2022, which prompted her to present to the ED. Subsequently underwent an upper endoscopy by Dr. Belvie Just at [facility] where diffuse moderate inflammation w/ erythema and friability found in gastric fundus and body; patent Biliroth II gastrojejunostomy visualized w/ a possible polyp at the anastomosis margin. Pathology determined Reactive Gastropathy w/ Gastric Xanthelasma (no H. Pylori identified and no  intestinal metaplasia detected) and Fragments of Pyloric Gland Adenoma w/ High-Grade Dysplasia (no malignancy or H. Pylori identified and no intestinal metaplasia detected).             Repeat Endoscopy in November 2024 w/ a single 2 mm mucosal papule w/o bleeding or stigmata found in gastric body (gastric hyperplastic polyp per pathology; no dysplasia, malignancy, or H. Pylori identified and no intestinal metaplasia detected) and diffusely severe erythematous mucosa w/ bleeding on contact found in entire examined stomach (reactive gastropathy per pathology; no dysplasia, malignancy, or H. Pylori identified and no intestinal metaplasia detected). Following  this,she was placed on Nexium  40 mg daily.     On 08/30/23 She had an endoscopy by Dr. Just which found that she had chronic superficial gastritis and her stomach was biopsied. Biopsy revealed that she had reactive gastropathy with focal erosion. On 09/17/2023 She was prescribed hyoscyamine sulfate 0.125 mg sublingual every 4 hours for stomach cramping.  She is also taking Cholestyramine 4 g daily. This has helped tremendously. She is feeling better. Her appetite has improved. No more complaint of epigastric pain. She looks well and seems much happier.   Vaccine counseling: Pneumococcal 20 vaccine given today.  I,Makayla C Reid,acting as a scribe for Ronal JINNY Hailstone, MD.,have documented all relevant documentation on the behalf of Ronal JINNY Hailstone, MD,as directed by  Ronal JINNY Hailstone, MD while in the presence of Ronal JINNY Hailstone, MD.  I, Ronal JINNY Hailstone, MD, have reviewed all documentation for this visit. The documentation on 11/09/2023 for the exam, diagnosis, procedures, and orders are all accurate and complete.

## 2023-11-09 ENCOUNTER — Ambulatory Visit (INDEPENDENT_AMBULATORY_CARE_PROVIDER_SITE_OTHER): Admitting: Internal Medicine

## 2023-11-09 ENCOUNTER — Telehealth: Payer: Self-pay

## 2023-11-09 ENCOUNTER — Encounter: Payer: Self-pay | Admitting: Internal Medicine

## 2023-11-09 VITALS — BP 102/70 | HR 83 | Ht 65.0 in | Wt 82.0 lb

## 2023-11-09 DIAGNOSIS — R7989 Other specified abnormal findings of blood chemistry: Secondary | ICD-10-CM

## 2023-11-09 DIAGNOSIS — Z85028 Personal history of other malignant neoplasm of stomach: Secondary | ICD-10-CM

## 2023-11-09 DIAGNOSIS — Z903 Acquired absence of stomach [part of]: Secondary | ICD-10-CM | POA: Diagnosis not present

## 2023-11-09 DIAGNOSIS — M810 Age-related osteoporosis without current pathological fracture: Secondary | ICD-10-CM

## 2023-11-09 DIAGNOSIS — G35D Multiple sclerosis, unspecified: Secondary | ICD-10-CM | POA: Diagnosis not present

## 2023-11-09 DIAGNOSIS — Z23 Encounter for immunization: Secondary | ICD-10-CM | POA: Diagnosis not present

## 2023-11-09 NOTE — Telephone Encounter (Signed)
 Prolia VOB initiated via [email or web address]  Next Prolia inj DUE: 01/28/23

## 2023-11-10 LAB — T4, FREE: Free T4: 1.4 ng/dL (ref 0.8–1.8)

## 2023-11-10 LAB — TSH: TSH: 0.02 m[IU]/L — ABNORMAL LOW (ref 0.40–4.50)

## 2023-11-11 ENCOUNTER — Ambulatory Visit: Payer: Self-pay | Admitting: Internal Medicine

## 2023-11-12 ENCOUNTER — Other Ambulatory Visit (HOSPITAL_COMMUNITY): Payer: Self-pay

## 2023-11-12 NOTE — Telephone Encounter (Signed)
 Erika Murphy

## 2023-11-12 NOTE — Patient Outreach (Signed)
 Complex Care Management   Visit Note  11/07/2023  Name:  Erika Murphy MRN: [id number] DOB: [birth year]  Situation: Referral received for Complex Care Management related to Multiple Sclerosis, low body weight, Carcinoma in situ of stomach, Osteoporosis, Reactive gastropathy, weight loss, lumbar degenerative changes with back pain, bilateral knee pain, Abnormal TSH. I obtained verbal consent from Patient.  Visit completed with Patient on the phone.  Background:   Past Medical History:  Diagnosis Date   Anxiety    Arthritis    right shoulder (10/09/2013)   Asthma    pt denies   B12 deficiency anemia    pt denies   Fibromyalgia    GERD (gastroesophageal reflux disease)    History of blood transfusion 2001   when I had my stomach cancer   Hypertension    IBS (irritable bowel syndrome)    Iron  deficiency anemia 12/11/2014   Malabsorption of iron  12/11/2014   MS (multiple sclerosis) dx'd ~ 1998   Neurogenic bladder    due to MS   Osteopenia    Osteoporosis    PONV (postoperative nausea and vomiting)    Stomach cancer (HCC) 2001   Thyroid  nodule    right    Assessment: Patient Reported Symptoms:  Cognitive Cognitive Status: Alert and oriented to person, place, and time, Normal speech and language skills Cognitive/Intellectual Conditions Management [RPT]: None reported or documented in medical history or problem list   Health Maintenance Behaviors: Annual physical exam, Immunizations, Sleep adequate, Healthy diet Health Facilitated by: Rest, Healthy diet  Neurological Neurological Review of Symptoms: Numbness Neurological Management Strategies: Adequate rest, Routine screening Neurological Self-Management Outcome: 4 (good) Neurological Comment: tingling and neuralgia to right leg and foot secondary to MS  HEENT HEENT Symptoms Reported: No symptoms reported      Cardiovascular Cardiovascular Symptoms Reported: No symptoms reported Does patient have uncontrolled  Hypertension?: No    Respiratory Respiratory Symptoms Reported: No symptoms reported    Endocrine Endocrine Symptoms Reported: Unintentional weight loss Is patient diabetic?: No Endocrine Self-Management Outcome: 3 (uncertain)  Gastrointestinal Gastrointestinal Symptoms Reported: Change in appetite Other Gastrointestinal Symptoms: patient reports having an increased appetite, reports a recent weight gain of 5 lbs Gastrointestinal Management Strategies: Diet modification, Fluid modification, Medication therapy Gastrointestinal Self-Management Outcome: 4 (good)    Genitourinary Genitourinary Symptoms Reported: No symptoms reported    Integumentary Integumentary Symptoms Reported: No symptoms reported    Musculoskeletal Musculoskelatal Symptoms Reviewed: Limited mobility, Joint pain Additional Musculoskeletal Details: continues to use a Rollator walker for ambulation Musculoskeletal Management Strategies: Adequate rest, Routine screening, Medical device Musculoskeletal Self-Management Outcome: 4 (good)      Psychosocial Psychosocial Symptoms Reported: No symptoms reported   Major Change/Loss/Stressor/Fears (CP): Denies Quality of Family Relationships: involved, supportive, helpful Do you feel physically threatened by others?: No    11/12/2023    PHQ2-9 Depression Screening   Erika Murphy interest or pleasure in doing things    Feeling down, depressed, or hopeless    PHQ-2 - Total Score    Trouble falling or staying asleep, or sleeping too much    Feeling tired or having Erika Murphy energy    Poor appetite or overeating     Feeling bad about yourself - or that you are a failure or have let yourself or your family down    Trouble concentrating on things, such as reading the newspaper or watching television    Moving or speaking so slowly that other people could have noticed.  Or the opposite - being  so fidgety or restless that you have been moving around a lot more than usual    Thoughts that  you would be better off dead, or hurting yourself in some way    PHQ2-9 Total Score    If you checked off any problems, how difficult have these problems made it for you to do your work, take care of things at home, or get along with other people    Depression Interventions/Treatment      There were no vitals filed for this visit.  Medications Reviewed Today     Reviewed by Morgan Clayborne CROME, RN (Registered Nurse) on 11/07/23 at 1320  Med List Status: <None>   Medication Order Taking? Sig Documenting Provider Last Dose Status Informant  acetaminophen  (TYLENOL ) 500 MG tablet [id number]  Take 500 mg by mouth every 6 (six) hours as needed for mild pain (pain score 1-3). [provider]  Active   albuterol  (VENTOLIN  HFA) 108 (90 Base) MCG/ACT inhaler [id number]  INHALE 2 PUFFS INTO THE LUNGS EVERY 6 HOURS AS NEEDED FOR SHORTNESS OF BREATH/WHEEZING Perri Ronal PARAS, MD  Active Self  ALPRAZolam  (XANAX ) 0.5 MG tablet [id number]  Take one half tab twice daily as needed for anxiety.  Patient taking differently: Take 0.25-0.5 mg by mouth 2 (two) times daily as needed for anxiety. Take one half tab twice daily as needed for anxiety.   Perri Ronal PARAS, MD  Active Self  amLODipine  (NORVASC ) 2.5 MG tablet [id number]  TAKE 1 TABLET BY MOUTH EVERY DAY Baxley, Ronal PARAS, MD  Active   cholestyramine light (PREVALITE) 4 g packet [id number]  Take 4 g by mouth daily. Patient is taking as needed [provider]  Active Self  cyanocobalamin  (VITAMIN B12) 1000 MCG/ML injection [id number]  INJECT INTO THE MUSCLE ONCE MONTHLY AS DIRECTED Baxley, Ronal PARAS, MD  Active   denosumab  (PROLIA ) 60 MG/ML SOSY injection [id number]  Inject 60 mg into the skin every 6 (six) months. Dx code: m81.0.  pt has appt on 05/29/23 Perri Ronal PARAS, MD  Active   denosumab  (PROLIA ) injection 60 mg [id number]   Perri Ronal PARAS, MD  Active   esomeprazole  (NEXIUM ) 40 MG capsule [id number]  TAKE 1 CAPSULE BY MOUTH DAILY AT 12 NOON Baxley, Ronal PARAS,  MD  Active   hyoscyamine (LEVSIN SL) 0.125 MG SL tablet [id number]  Take 0.125 mg by mouth every 4 (four) hours as needed for cramping. [provider]  Active   lipase/protease/amylase (CREON ) 12000-38000 units CPEP capsule [id number]  Take 36,000 Units by mouth 3 (three) times daily before meals. [provider]  Active   meclizine  (ANTIVERT ) 25 MG tablet [id number]  Take 1 tablet (25 mg total) by mouth 3 (three) times daily as needed for dizziness. Perri Ronal PARAS, MD  Active Self  megestrol  (MEGACE ) 400 MG/10ML suspension [id number]  Take 10 mLs (400 mg total) by mouth 2 (two) times daily. Franchot Lauraine HERO, NP  Active   methocarbamol  (ROBAXIN ) 500 MG tablet [id number]  TAKE 1 TABLET BY MOUTH EVERY 8 HOURS AS NEEDED FOR MUSCLE SPASMS. Perri Ronal PARAS, MD  Active   Multiple Vitamins-Minerals (MULTIVITAMIN GUMMIES WOMENS PO) [id number]  Take 1 tablet by mouth daily. [provider]  Active Self  oxandrolone  (OXANDRIN) 2.5 MG tablet [id number]  Take 2 tablets (5 mg total) by mouth 2 (two) times daily. Timmy Maude SAUNDERS, MD  Active   pregabalin  (LYRICA ) 50 MG capsule [id number]  TAKE 1 CAPSULE BY MOUTH THREE TIMES A  DAY Perri Ronal PARAS, MD  Active   rosuvastatin  (CRESTOR ) 5 MG tablet [id number]  TAKE 1 TABLET (5 MG TOTAL) BY MOUTH DAILY. Perri Ronal PARAS, MD  Active             Recommendation:   PCP Follow-up Specialty provider follow-up   01/02/2024 Status: Sch   Time: 1:00 PM Length: 15  Visit Type: LAB [1477] Copay: $0.00  Provider: [facility] Department: [facility]    01/02/2024 Status: Sch   Time: 1:15 PM Length: 15  Visit Type: EST PT 15 [330] Copay: $25.00  Provider: Franchot Lauraine HERO, NP Department: [facility]   Follow Up Plan:   Telephone follow up appointment date/time:  Wednesday, November 26 at 1:30 PM  Clayborne Ly RN BSN CCM [place]  [facility], [facility] Health Nurse Care Coordinator  Direct Dial: [phone] Website:  [email or web address]

## 2023-11-12 NOTE — Patient Instructions (Signed)
 Visit Information  Thank you for taking time to visit with me today. Please don't hesitate to contact me if I can be of assistance to you before our next scheduled appointment.  Your next care management appointment is by telephone on Wednesday, November 26 at 1:30 PM  Please call the care guide team at [phone] if you need to cancel, schedule, or reschedule an appointment.   Please call [phone]-TALK (toll free, 24 hour hotline) if you are experiencing a Mental Health or Behavioral Health Crisis or need someone to talk to.  Clayborne Ly RN BSN CCM [place]  [facility], [facility] Health Nurse Care Coordinator  Direct Dial: [phone] Website: [email or web address]

## 2023-11-12 NOTE — Telephone Encounter (Signed)
 Pt ready for scheduling for PROLIA  on or after : 12/08/23  Option# 1: Buy/Bill (Office supplied medication)  Out-of-pocket cost due at time of clinic visit: $332  Number of injection/visits approved: 2  Primary: [employer] Prolia  co-insurance: 20% Admin fee co-insurance: 0%  Secondary: --- Prolia  co-insurance:  Admin fee co-insurance:   Medical Benefit Details: Date Benefits were checked: 11/12/23 Deductible: NO/ Coinsurance: 20%/ Admin Fee: 0%  Prior Auth: APPROVED PA# [id number] Expiration Date: 01/03/23-01/02/24   # of doses approved: 2 ----------------------------------------------------------------------- Option# 2- Med Obtained from pharmacy:  Pharmacy benefit: Copay $0 (Paid to pharmacy) Admin Fee: 0% (Pay at clinic)  Prior Auth: N/A PA# Expiration Date:   # of doses approved:   If patient wants fill through the pharmacy benefit please send prescription to: [facility], and include estimated need by date in rx notes. Pharmacy will ship medication directly to the office.  Patient NOT eligible for Prolia  Copay Card. Copay Card can make patient's cost as little as $25. Link to apply: [email or web address]://www.amgensupportplus.com/copay  ** This summary of benefits is an estimation of the patient's out-of-pocket cost. Exact cost may very based on individual plan coverage.

## 2023-11-14 ENCOUNTER — Other Ambulatory Visit: Payer: Self-pay

## 2023-11-16 ENCOUNTER — Telehealth: Payer: Self-pay | Admitting: Internal Medicine

## 2023-11-16 DIAGNOSIS — R7989 Other specified abnormal findings of blood chemistry: Secondary | ICD-10-CM

## 2023-11-16 NOTE — Telephone Encounter (Signed)
Endo referral made

## 2023-11-23 ENCOUNTER — Ambulatory Visit: Payer: Self-pay | Admitting: Internal Medicine

## 2023-11-24 NOTE — Patient Instructions (Addendum)
 TSH remains low. Not sure why. Free T4 is normal. Suggest checking free T4 and TSH in 4 months. May have pituitary issue. Has gained 2 pounds since Sept. Pneumococcal vaccine given today. Continue current dose of thyroid  replacement medication. Weight stable and has gained 2 lbs.

## 2023-11-28 ENCOUNTER — Other Ambulatory Visit: Payer: Self-pay

## 2023-11-28 NOTE — Patient Instructions (Signed)
 Visit Information  Thank you for taking time to visit with me today. Please don't hesitate to contact me if I can be of assistance to you before our next scheduled appointment.  Your next care management appointment is by telephone on Friday, December 19 at 12:15 PM  Please call the care guide team at [phone] if you need to cancel, schedule, or reschedule an appointment.   Please call the Suicide and Crisis Lifeline: 988 if you are experiencing a Mental Health or Behavioral Health Crisis or need someone to talk to.  Clayborne Ly RN BSN CCM [place]  [facility], [facility] Health Nurse Care Coordinator  Direct Dial: [phone] Website: [email or web address]

## 2023-11-28 NOTE — Patient Outreach (Signed)
 Complex Care Management   Visit Note  11/28/2023  Name:  Erika Murphy MRN: [id number] DOB: July 26, 1950  Situation: Referral received for Complex Care Management related to Multiple Sclerosis, low body weight, Carcinoma in situ of stomach, Osteoporosis, Reactive gastropathy, weight loss, lumbar degenerative changes with back pain, bilateral knee pain, Abnormal TSH.  I obtained verbal consent from Patient.  Visit completed with Patient on the phone.  Background:   Past Medical History:  Diagnosis Date   Anxiety    Arthritis    right shoulder (10/09/2013)   Asthma    pt denies   B12 deficiency anemia    pt denies   Fibromyalgia    GERD (gastroesophageal reflux disease)    History of blood transfusion 2001   when I had my stomach cancer   Hypertension    IBS (irritable bowel syndrome)    Iron  deficiency anemia 12/11/2014   Malabsorption of iron  12/11/2014   MS (multiple sclerosis) dx'd ~ 1998   Neurogenic bladder    due to MS   Osteopenia    Osteoporosis    PONV (postoperative nausea and vomiting)    Stomach cancer (HCC) 2001   Thyroid  nodule    right    Assessment: Patient Reported Symptoms:  Cognitive Cognitive Status: Alert and oriented to person, place, and time, Normal speech and language skills Cognitive/Intellectual Conditions Management [RPT]: None reported or documented in medical history or problem list   Health Maintenance Behaviors: Annual physical exam, Healthy diet, Sleep adequate  Neurological Neurological Review of Symptoms: Numbness, Other: [place] Neurological Symptoms/Conditions [RPT]: coolness to right lower extremity Neurological Management Strategies: Adequate rest, Routine screening Neurological Self-Management Outcome: 3 (uncertain) Neurological Comment: tingling and neuralgia with cold sensation to right leg and foot secondary to MS  HEENT HEENT Symptoms Reported: No symptoms reported      Cardiovascular Cardiovascular Symptoms Reported: No  symptoms reported Does patient have uncontrolled Hypertension?: No    Respiratory Respiratory Symptoms Reported: Shortness of breath Other Respiratory Symptoms: intermittent shortness of breath related to allergies Respiratory Management Strategies: Routine screening, Medication therapy Respiratory Self-Management Outcome: 4 (good)  Endocrine Endocrine Symptoms Reported: Weight loss Is patient diabetic?: No Endocrine Self-Management Outcome: 3 (uncertain)  Gastrointestinal Gastrointestinal Symptoms Reported: Change in appetite Gastrointestinal Management Strategies: Medication therapy (Routine Screening) Gastrointestinal Self-Management Outcome: 4 (good)    Genitourinary Genitourinary Symptoms Reported: No symptoms reported    Integumentary Integumentary Symptoms Reported: No symptoms reported    Musculoskeletal Musculoskelatal Symptoms Reviewed: Limited mobility, Joint pain Additional Musculoskeletal Details: using Rollator for ambulation Musculoskeletal Management Strategies: Adequate rest, Routine screening, Medical device Musculoskeletal Self-Management Outcome: 4 (good) Falls in the past year?: No Number of falls in past year: 1 or less Was there an injury with Fall?: No Fall Risk Category Calculator: 0 Patient Fall Risk Level: Low Fall Risk Patient at Risk for Falls Due to: Impaired mobility Fall risk Follow up: Falls evaluation completed  Psychosocial Psychosocial Symptoms Reported: No symptoms reported   Major Change/Loss/Stressor/Fears (CP): Denies      11/28/2023    PHQ2-9 Depression Screening   Anson Peddie interest or pleasure in doing things    Feeling down, depressed, or hopeless    PHQ-2 - Total Score    Trouble falling or staying asleep, or sleeping too much    Feeling tired or having Bailen Geffre energy    Poor appetite or overeating     Feeling bad about yourself - or that you are a failure or have let yourself or your family  down    Trouble concentrating on things,  such as reading the newspaper or watching television    Moving or speaking so slowly that other people could have noticed.  Or the opposite - being so fidgety or restless that you have been moving around a lot more than usual    Thoughts that you would be better off dead, or hurting yourself in some way    PHQ2-9 Total Score    If you checked off any problems, how difficult have these problems made it for you to do your work, take care of things at home, or get along with other people    Depression Interventions/Treatment      There were no vitals filed for this visit. Pain Scale: Not given for pain  Medications Reviewed Today     Reviewed by Morgan Clayborne CROME, RN (Registered Nurse) on 11/28/23 at 1318  Med List Status: <None>   Medication Order Taking? Sig Documenting Provider Last Dose Status Informant  acetaminophen  (TYLENOL ) 500 MG tablet [id number]  Take 500 mg by mouth every 6 (six) hours as needed for mild pain (pain score 1-3). [provider]  Active   albuterol  (VENTOLIN  HFA) 108 (90 Base) MCG/ACT inhaler [id number]  INHALE 2 PUFFS INTO THE LUNGS EVERY 6 HOURS AS NEEDED FOR SHORTNESS OF BREATH/WHEEZING Perri Ronal PARAS, MD  Active Self  ALPRAZolam  (XANAX ) 0.5 MG tablet [id number]  Take one half tab twice daily as needed for anxiety.  Patient taking differently: Take 0.25-0.5 mg by mouth 2 (two) times daily as needed for anxiety. Take one half tab twice daily as needed for anxiety.   Perri Ronal PARAS, MD  Active Self  amLODipine  (NORVASC ) 2.5 MG tablet [id number]  TAKE 1 TABLET BY MOUTH EVERY DAY Baxley, Ronal PARAS, MD  Active   cholestyramine light (PREVALITE) 4 g packet [id number]  Take 4 g by mouth daily. Patient is taking as needed [provider]  Active Self  cyanocobalamin  (VITAMIN B12) 1000 MCG/ML injection [id number]  INJECT 1ML INTO THE MUSCLE ONCE MONTHLY AS DIRECTED Baxley, Ronal PARAS, MD  Active   denosumab  (PROLIA ) 60 MG/ML SOSY injection [id number]  Inject 60 mg into the  skin every 6 (six) months. Dx code: m81.0.  pt has appt on 05/29/23 Perri Ronal PARAS, MD  Active   denosumab  (PROLIA ) injection 60 mg [id number]   Perri Ronal PARAS, MD  Active   esomeprazole  (NEXIUM ) 40 MG capsule [id number]  TAKE 1 CAPSULE BY MOUTH DAILY AT 12 NOON Baxley, Ronal PARAS, MD  Active   hyoscyamine (LEVSIN SL) 0.125 MG SL tablet [id number]  Take 0.125 mg by mouth every 4 (four) hours as needed for cramping. [provider]  Active   lipase/protease/amylase (CREON ) 12000-38000 units CPEP capsule [id number]  Take 36,000 Units by mouth 3 (three) times daily before meals. [provider]  Active   meclizine  (ANTIVERT ) 25 MG tablet [id number]  Take 1 tablet (25 mg total) by mouth 3 (three) times daily as needed for dizziness. Perri Ronal PARAS, MD  Active Self  methocarbamol  (ROBAXIN ) 500 MG tablet [id number]  TAKE 1 TABLET BY MOUTH EVERY 8 HOURS AS NEEDED FOR MUSCLE SPASMS. Perri Ronal PARAS, MD  Active   Multiple Vitamins-Minerals (MULTIVITAMIN GUMMIES WOMENS PO) [id number]  Take 1 tablet by mouth daily. [provider]  Active Self  pregabalin  (LYRICA ) 50 MG capsule [id number]  TAKE 1 CAPSULE BY MOUTH THREE TIMES A DAY Baxley, Ronal PARAS, MD  Active   rosuvastatin  (CRESTOR ) 5  MG tablet [id number]  TAKE 1 TABLET (5 MG TOTAL) BY MOUTH DAILY. Perri Ronal PARAS, MD  Active             Recommendation:   Specialty provider follow-up with [facility] as directed   Follow Up Plan:   Telephone follow up appointment date/time:     12/21/2023 Status: Sch   Time: 12:15 PM Length: 30  Visit Type: VBCI TELEPHONE CALL 30 [2502] Copay: $0.00  Provider: Morgan Clayborne CROME, RN Department: [facility]   Clayborne Morgan RN BSN CCM [place]  [facility], [facility] Health Nurse Care Coordinator  Direct Dial: [phone] Website: [email or web address]

## 2023-11-30 ENCOUNTER — Other Ambulatory Visit: Payer: Self-pay

## 2023-11-30 ENCOUNTER — Other Ambulatory Visit: Payer: Self-pay | Admitting: Internal Medicine

## 2023-11-30 DIAGNOSIS — M810 Age-related osteoporosis without current pathological fracture: Secondary | ICD-10-CM

## 2023-12-03 ENCOUNTER — Other Ambulatory Visit: Payer: Self-pay

## 2023-12-03 ENCOUNTER — Other Ambulatory Visit: Payer: Self-pay | Admitting: Internal Medicine

## 2023-12-03 ENCOUNTER — Other Ambulatory Visit (HOSPITAL_COMMUNITY): Payer: Self-pay

## 2023-12-03 DIAGNOSIS — M810 Age-related osteoporosis without current pathological fracture: Secondary | ICD-10-CM

## 2023-12-03 MED ORDER — PROLIA 60 MG/ML ~~LOC~~ SOSY
60.0000 mg | PREFILLED_SYRINGE | SUBCUTANEOUS | 0 refills | Status: AC
  Filled 2023-12-03 – 2023-12-05 (×2): qty 1, 180d supply, fill #0

## 2023-12-05 ENCOUNTER — Other Ambulatory Visit: Payer: Self-pay

## 2023-12-05 NOTE — Progress Notes (Signed)
 Specialty Pharmacy Refill Coordination Note  Erika Murphy is a 73 y.o. female assessed today regarding refills of clinic administered specialty medication(s) Denosumab  (PROLIA )   Clinic requested Courier to Provider Office   Delivery date: 12/06/23   Verified address: [facility]   Medication will be filled on: 12/05/23  Apponintment 12/10/23.

## 2023-12-07 ENCOUNTER — Other Ambulatory Visit

## 2023-12-07 ENCOUNTER — Ambulatory Visit: Admitting: Internal Medicine

## 2023-12-07 ENCOUNTER — Encounter: Payer: Self-pay | Admitting: Internal Medicine

## 2023-12-07 VITALS — BP 140/80 | Ht 65.0 in | Wt 84.0 lb

## 2023-12-07 DIAGNOSIS — E041 Nontoxic single thyroid nodule: Secondary | ICD-10-CM | POA: Insufficient documentation

## 2023-12-07 DIAGNOSIS — E0590 Thyrotoxicosis, unspecified without thyrotoxic crisis or storm: Secondary | ICD-10-CM | POA: Diagnosis not present

## 2023-12-07 NOTE — Progress Notes (Signed)
 Name: Erika Murphy  MRN/ DOB: [id number], July 30, 1950    Age/ Sex: 73 y.o., female    PCP: Perri Ronal PARAS, MD   Reason for Endocrinology Evaluation: Subclinical hyperthyroid     Date of Initial Endocrinology Evaluation: 12/07/2023     HPI: [name] is a 73 y.o. female with a past medical history of MS, Hx in situ stomach carcinoma (Dx 2001), GERD, fibromyalgia. The patient presented for initial endocrinology clinic visit on 12/07/2023 for consultative assistance with her subclinical hyperthyroidism.   Patient has been noted with suppressed TSH <0.100 u IU/ML on 11/02/2023 during follow-up with oncology for history of adenocarcinoma of the stomach in remission.   Repeat TFTs on 11/09/2023 showed a suppressed TSH again at 0.02u IU/ML with a normal  free T4 of 1.4 ng/dL    The patient does have history of osteoporosis   The patient is under the care of oncology for history of gastric carcinoma.  She underwent resection followed by adjuvant therapy in ~2001   Of note, the patient has been diagnosed with thyroid  nodules since 2004.  She is s/p benign FNA of the right thyroid  nodule on 01/13/2002  She was temporarily on levothyroxine  ~ 20 yrs ago , she was on it for a few months but was discontinued   Weight stable  No local neck swelling  Has occasional palpitations  Has chronic tremors that she is not sure if is related to MS No constipation or diarrhea No Biotin  She does have warmth in the hands Has occasional anxiety/jittery sensation   No FH of thyroid  disease    HISTORY:  Past Medical History:  Past Medical History:  Diagnosis Date   Anxiety    Arthritis    right shoulder (10/09/2013)   Asthma    pt denies   B12 deficiency anemia    pt denies   Fibromyalgia    GERD (gastroesophageal reflux disease)    History of blood transfusion 2001   when I had my stomach cancer   Hypertension    IBS (irritable bowel syndrome)    Iron  deficiency anemia  12/11/2014   Malabsorption of iron  12/11/2014   MS (multiple sclerosis) dx'd ~ 1998   Neurogenic bladder    due to MS   Osteopenia    Osteoporosis    PONV (postoperative nausea and vomiting)    Stomach cancer (HCC) 2001   Thyroid  nodule    right   Past Surgical History:  Past Surgical History:  Procedure Laterality Date   BIOPSY THYROID  Right 09/02/1988   CESAREAN SECTION  1975; 1984   COLONOSCOPY WITH PROPOFOL  N/A 05/04/2017   Procedure: COLONOSCOPY WITH PROPOFOL ;  Surgeon: Rollin Dover, MD;  Location: [facility];  Service: Endoscopy;  Laterality: N/A;   ESOPHAGOGASTRODUODENOSCOPY (EGD) WITH PROPOFOL  N/A 05/04/2017   Procedure: ESOPHAGOGASTRODUODENOSCOPY (EGD) WITH PROPOFOL ;  Surgeon: Rollin Dover, MD;  Location: [facility];  Service: Endoscopy;  Laterality: N/A;   OMENTECTOMY  01/03/1999   partial   PARTIAL GASTRECTOMY  01/03/1999   REVERSE SHOULDER ARTHROPLASTY Right 05/27/2021   Procedure: REVERSE SHOULDER ARTHROPLASTY;  Surgeon: Kay Kemps, MD;  Location: [facility];  Service: Orthopedics;  Laterality: Right;  with ISB   TONSILLECTOMY  ~ 1959   TOTAL HIP ARTHROPLASTY Right 08/18/2014   Procedure: TOTAL HIP ARTHROPLASTY ANTERIOR APPROACH;  Surgeon: Redell Shoals, MD;  Location: [facility];  Service: Orthopedics;  Laterality: Right;   TOTAL SHOULDER ARTHROPLASTY Left 01/02/2009    Social History:  reports that she has never smoked. She has never used smokeless tobacco. She reports that she does not drink alcohol  and does not use drugs. Family History: family history includes Breast cancer in her maternal aunt and sister; Diabetes in her father; Heart disease in her mother; Multiple sclerosis in an other family member; Stomach cancer in her brother.   HOME MEDICATIONS: Allergies as of 12/07/2023       Reactions   Grass Pollen(k-o-r-t-swt Vern) Cough        Medication List        Accurate as of December 07, 2023  1:19 PM. If you have any questions, ask your nurse or  doctor.          acetaminophen  500 MG tablet Commonly known as: TYLENOL  Take 500 mg by mouth every 6 (six) hours as needed for mild pain (pain score 1-3).   albuterol  108 (90 Base) MCG/ACT inhaler Commonly known as: VENTOLIN  HFA INHALE 2 PUFFS INTO THE LUNGS EVERY 6 HOURS AS NEEDED FOR SHORTNESS OF BREATH/WHEEZING   ALPRAZolam  0.5 MG tablet Commonly known as: Xanax  Take one half tab twice daily as needed for anxiety. What changed:  how much to take how to take this when to take this reasons to take this   amLODipine  2.5 MG tablet Commonly known as: NORVASC  TAKE 1 TABLET BY MOUTH EVERY DAY   cholestyramine light 4 g packet Commonly known as: PREVALITE Take 4 g by mouth daily. Patient is taking as needed   cyanocobalamin  1000 MCG/ML injection Commonly known as: VITAMIN B12 INJECT 1ML INTO THE MUSCLE ONCE MONTHLY AS DIRECTED   esomeprazole  40 MG capsule Commonly known as: NEXIUM  TAKE 1 CAPSULE BY MOUTH DAILY AT 12 NOON   hyoscyamine 0.125 MG SL tablet Commonly known as: LEVSIN SL Take 0.125 mg by mouth every 4 (four) hours as needed for cramping.   lipase/protease/amylase 12000-38000 units Cpep capsule Commonly known as: CREON  Take 36,000 Units by mouth 3 (three) times daily before meals.   meclizine  25 MG tablet Commonly known as: ANTIVERT  Take 1 tablet (25 mg total) by mouth 3 (three) times daily as needed for dizziness.   methocarbamol  500 MG tablet Commonly known as: ROBAXIN  TAKE 1 TABLET BY MOUTH EVERY 8 HOURS AS NEEDED FOR MUSCLE SPASMS.   MULTIVITAMIN GUMMIES WOMENS PO Take 1 tablet by mouth daily.   pregabalin  50 MG capsule Commonly known as: LYRICA  TAKE 1 CAPSULE BY MOUTH THREE TIMES A DAY   Prolia  60 MG/ML Sosy injection Generic drug: denosumab  Inject 60 mg into the skin every 6 (six) months. Dx code: m81.0.  pt has appt on 05/29/23   rosuvastatin  5 MG tablet Commonly known as: CRESTOR  TAKE 1 TABLET (5 MG TOTAL) BY MOUTH DAILY.   Tylenol   Precise Pain Relieving 4 % cream Generic drug: lidocaine  Apply 1 Application topically daily as needed.          REVIEW OF SYSTEMS: A comprehensive ROS was conducted with the patient and is negative except as per HPI    OBJECTIVE:  VS: BP (!) 140/80   Ht 5' 5 (1.651 m)   Wt 84 lb (38.1 kg)   BMI 13.98 kg/m    Wt Readings from Last 3 Encounters:  12/07/23 84 lb (38.1 kg)  11/09/23 82 lb (37.2 kg)  11/02/23 83 lb 1.9 oz (37.7 kg)     EXAM: General: Pt appears well and is in NAD  Eyes: External eye exam normal without stare, lid lag or exophthalmos.  EOM intact.    Neck: General: Supple without adenopathy. Thyroid : Right nodule appreciated. No thyroid  bruit.  Lungs: Clear with good BS bilat   Heart: Auscultation: RRR.  Abdomen: Soft, nontender  Extremities:  BL LE: No pretibial edema   Mental Status: Judgment, insight: Intact Orientation: Oriented to time, place, and person Mood and affect: No depression, anxiety, or agitation     DATA REVIEWED:   Latest Reference Range & Units 12/07/23 13:42  TSH 0.40 - 4.50 mIU/L 0.04 (L)  Triiodothyronine,Free,Serum 2.3 - 4.2 pg/mL 3.9  T4,Free(Direct) 0.8 - 1.8 ng/dL 1.2  (L): Data is abnormally low     Latest Reference Range & Units 11/02/23 12:32  Sodium 135 - 145 mmol/L 141  Potassium 3.5 - 5.1 mmol/L 4.4  Chloride 98 - 111 mmol/L 106  CO2 22 - 32 mmol/L 26  Glucose 70 - 99 mg/dL 871 (H)  BUN 8 - 23 mg/dL 19  Creatinine 9.55 - 8.99 mg/dL 9.01  Calcium  8.9 - 10.3 mg/dL 9.4  Anion gap 5 - 15  9  Alkaline Phosphatase 38 - 126 U/L 59  Albumin 3.5 - 5.0 g/dL 4.1  AST 15 - 41 U/L 29  ALT 0 - 44 U/L 21  Total Protein 6.5 - 8.1 g/dL 7.3  Total Bilirubin 0.0 - 1.2 mg/dL 0.6  GFR, Est Non African American >60 mL/min 60 (L)    Thyroid  Ultrasound 08/21/2023   FINDINGS: Parenchymal Echotexture: Mildly heterogenous   Isthmus: 0.2 cm   Right lobe: 3.8 x 2.3 x 2.4 cm   Left lobe: 4.5 x 1.5 x 1.1 cm    _________________________________________________________   Estimated total number of nodules >/= 1 cm: 1   Number of spongiform nodules >/=  2 cm not described below (TR1): 0   Number of mixed cystic and solid nodules >/= 1.5 cm not described below (TR2): 0   _________________________________________________________   Nodule 1: 3.3 x 2.2 x 1.9 cm spongiform nodule with benign calcifications, located in the mid RIGHT thyroid  lobe (TI-RADS 1), is unchanged in size since prior ultrasound from 09/29/2022 when it measured 3.0 x 2.1 x 1.9 cm. It is minimally larger since 2004 when it measured 2.1 x 1.1 x 1.8 cm, consistent with benign etiology. This nodule does not require further imaging follow-up or FNA.   No new thyroid  nodules are seen.   IMPRESSION: 1. Unchanged TI-RADS 1 RIGHT thyroid  nodule. 2. No new thyroid  nodules.      FNA right thyroid  nodule 01/13/2002  Benign    ASSESSMENT/PLAN/RECOMMENDATIONS:   Hyperthyroidism:  - Patient is clinically euthyroid - I did recommend treatment given risk of A-fib and osteoporosis - We discussed differential diagnosis to include toxic thyroid  nodule versus Graves' disease  -we discussed that Graves' Disease is a result of an autoimmune condition involving the thyroid .  -We discussed with pt the benefits of methimazole  in the Tx of hyperthyroidism, as well as the possible side effects/complications of anti-thyroid  drug Tx (specifically detailing the rare, but serious side effect of agranulocytosis). She was informed of need for regular thyroid  function monitoring while on methimazole  to ensure appropriate dosage without over-treatment. As well, we discussed the possible side effects of methimazole  including the chance of rash, the small chance of liver irritation/juandice and the <=1 in 300-400 chance of sudden onset agranulocytosis.  We discussed importance of going to ED promptly (and stopping methimazole ) if shewere to develop  significant fever with severe sore throat of other evidence of acute infection.    -  We briefly discussed various treatment options including radioactive iodine ablation versus total thyroidectomy  - TFTs continue to show suppressed TSH, will treat  Medications : Start Methimazole  5 mg daily    2. Right thyroid  nodule   - S/p benign FNA in 2004 -No local neck symptoms - Repeat ultrasound in 2024 and 2025 confirm stability and benign process  Follow-up in 3 months Labs in 6 weeks  Signed electronically by: Stefano Redgie Butts, MD  [facility]  [facility] [address] [place], [place] [address] Phone: [phone] FAX: [phone]   CC: Perri Ronal PARAS, MD [id number] [place] [address] Phone: [phone] Fax: [phone]   Return to Endocrinology clinic as below: Future Appointments  Date Time Provider Department Center  12/10/2023 11:15 AM [facility] CLINICAL SUPPORT [facility] [address]  12/21/2023 12:15 PM Little, Clayborne CROME, RN [facility] None  01/02/2024  1:00 PM [facility] None  01/02/2024  1:15 PM Franchot Lauraine HERO, NP [facility] None

## 2023-12-10 ENCOUNTER — Ambulatory Visit: Payer: Self-pay | Admitting: Internal Medicine

## 2023-12-10 ENCOUNTER — Ambulatory Visit

## 2023-12-10 VITALS — BP 130/70 | HR 72 | Ht 65.0 in | Wt 84.0 lb

## 2023-12-10 DIAGNOSIS — M810 Age-related osteoporosis without current pathological fracture: Secondary | ICD-10-CM

## 2023-12-10 MED ORDER — METHIMAZOLE 5 MG PO TABS: 5.0000 mg | ORAL_TABLET | Freq: Every day | ORAL | 3 refills | Status: AC

## 2023-12-10 NOTE — Telephone Encounter (Signed)
 Can you please contact the patient and let her know that her thyroid  remains overactive, I suggest starting methimazole  5 mg, 1 tablet daily    A prescription was sent to the pharmacy   She already has an appointment for repeat labs in 6 weeks     Thanks

## 2023-12-10 NOTE — Progress Notes (Signed)
 Patient presents to clinic for a Prolia  injection SQ for osteoporosis. Patient tolerated well. Return in 6 months.

## 2023-12-10 NOTE — Patient Instructions (Addendum)
 Received Prolia  injection today. Next dose due in 6 months. Last bone density study done Dec 2023.T score was - 2.8 at that time. Will order another bone density to be done in the near future.

## 2023-12-11 LAB — THYROID STIMULATING IMMUNOGLOBULIN: TSI: <89 %{baseline} (ref <140)

## 2023-12-11 LAB — TSH: TSH: 0.04 m[IU]/L — ABNORMAL LOW (ref 0.40–4.50)

## 2023-12-11 LAB — T4, FREE: Free T4: 1.2 ng/dL (ref 0.8–1.8)

## 2023-12-11 LAB — T3, FREE: T3, Free: 3.9 pg/mL (ref 2.3–4.2)

## 2023-12-11 LAB — TRAB (TSH RECEPTOR BINDING ANTIBODY): TRAB: <1 IU/L (ref <=2.00)

## 2023-12-13 NOTE — Telephone Encounter (Signed)
 Done

## 2023-12-21 ENCOUNTER — Telehealth: Payer: Self-pay

## 2023-12-21 NOTE — Patient Instructions (Signed)
 Erika Murphy - I am sorry I was unable to reach you today for our scheduled appointment.   Your next care management appointment is by telephone on Wednesday, January 14 at 1:00 PM  Please call the care guide team at [phone] if you need to cancel, schedule, or reschedule an appointment.   Please call [phone] (toll free, 24 hour hotline) if you are experiencing a Mental Health or Behavioral Health Crisis or need someone to talk to.  Thank you,   Clayborne Ly RN BSN CCM [place]  [facility] Nurse Care Coordinator  Direct Dial: [phone] Website: [email or web address]

## 2024-01-02 ENCOUNTER — Inpatient Hospital Stay: Attending: Hematology & Oncology

## 2024-01-02 ENCOUNTER — Inpatient Hospital Stay: Admitting: Family

## 2024-01-02 ENCOUNTER — Other Ambulatory Visit: Payer: Self-pay

## 2024-01-02 ENCOUNTER — Encounter: Payer: Self-pay | Admitting: Family

## 2024-01-02 VITALS — BP 121/62 | HR 72 | Temp 97.8°F | Resp 18 | Ht 64.0 in | Wt 87.0 lb

## 2024-01-02 DIAGNOSIS — D002 Carcinoma in situ of stomach: Secondary | ICD-10-CM | POA: Diagnosis not present

## 2024-01-02 DIAGNOSIS — G35D Multiple sclerosis, unspecified: Secondary | ICD-10-CM | POA: Diagnosis not present

## 2024-01-02 DIAGNOSIS — D509 Iron deficiency anemia, unspecified: Secondary | ICD-10-CM | POA: Insufficient documentation

## 2024-01-02 DIAGNOSIS — D510 Vitamin B12 deficiency anemia due to intrinsic factor deficiency: Secondary | ICD-10-CM | POA: Diagnosis not present

## 2024-01-02 DIAGNOSIS — K909 Intestinal malabsorption, unspecified: Secondary | ICD-10-CM

## 2024-01-02 DIAGNOSIS — D500 Iron deficiency anemia secondary to blood loss (chronic): Secondary | ICD-10-CM

## 2024-01-02 DIAGNOSIS — Z85028 Personal history of other malignant neoplasm of stomach: Secondary | ICD-10-CM | POA: Diagnosis present

## 2024-01-02 LAB — CBC WITH DIFFERENTIAL (CANCER CENTER ONLY)
Abs Immature Granulocytes: 0.02 K/uL (ref 0.00–0.07)
Basophils Absolute: 0 K/uL (ref 0.0–0.1)
Basophils Relative: 1 %
Eosinophils Absolute: 0.1 K/uL (ref 0.0–0.5)
Eosinophils Relative: 3 %
HCT: 35.6 % — ABNORMAL LOW (ref 36.0–46.0)
Hemoglobin: 11.5 g/dL — ABNORMAL LOW (ref 12.0–15.0)
Immature Granulocytes: 1 %
Lymphocytes Relative: 36 %
Lymphs Abs: 1 K/uL (ref 0.7–4.0)
MCH: 30.3 pg (ref 26.0–34.0)
MCHC: 32.3 g/dL (ref 30.0–36.0)
MCV: 93.9 fL (ref 80.0–100.0)
Monocytes Absolute: 0.3 K/uL (ref 0.1–1.0)
Monocytes Relative: 11 %
Neutro Abs: 1.4 K/uL — ABNORMAL LOW (ref 1.7–7.7)
Neutrophils Relative %: 48 %
Platelet Count: 156 K/uL (ref 150–400)
RBC: 3.79 MIL/uL — ABNORMAL LOW (ref 3.87–5.11)
RDW: 11.9 % (ref 11.5–15.5)
WBC Count: 2.9 K/uL — ABNORMAL LOW (ref 4.0–10.5)
nRBC: 0 % (ref 0.0–0.2)

## 2024-01-02 LAB — CMP (CANCER CENTER ONLY)
ALT: 29 U/L (ref 0–44)
AST: 38 U/L (ref 15–41)
Albumin: 4.2 g/dL (ref 3.5–5.0)
Alkaline Phosphatase: 82 U/L (ref 38–126)
Anion gap: 10 (ref 5–15)
BUN: 14 mg/dL (ref 8–23)
CO2: 26 mmol/L (ref 22–32)
Calcium: 9.4 mg/dL (ref 8.9–10.3)
Chloride: 106 mmol/L (ref 98–111)
Creatinine: 1 mg/dL (ref 0.44–1.00)
GFR, Estimated: 59 mL/min — ABNORMAL LOW
Glucose, Bld: 81 mg/dL (ref 70–99)
Potassium: 4.4 mmol/L (ref 3.5–5.1)
Sodium: 142 mmol/L (ref 135–145)
Total Bilirubin: 0.7 mg/dL (ref 0.0–1.2)
Total Protein: 7.5 g/dL (ref 6.5–8.1)

## 2024-01-02 LAB — IRON AND IRON BINDING CAPACITY (CC-WL,HP ONLY)
Iron: 112 ug/dL (ref 28–170)
Saturation Ratios: 30 % (ref 10.4–31.8)
TIBC: 374 ug/dL (ref 250–450)
UIBC: 262 ug/dL

## 2024-01-02 LAB — FERRITIN: Ferritin: 82 ng/mL (ref 11–307)

## 2024-01-02 LAB — LACTATE DEHYDROGENASE: LDH: 183 U/L (ref 105–235)

## 2024-01-02 NOTE — Progress Notes (Signed)
 " Hematology and Oncology Follow Up Visit  Erika Murphy [id number] 04/11/1950 73 y.o. 01/02/2024   Principle Diagnosis:  Stage II (T3N0M0) adenocarcinoma of the stomach-remission Recurrent iron  deficiency anemia  Pernicious anemia Multiple sclerosis   Current Therapy:        IV iron  as indicated Vitamin B-12 1 mg IM every month -- done at home Prolia  60 mg IM q 6 months -- with primary care provider's office   Interim History:  Erika Murphy is here today for follow-up. She states that she met with a new endocrinologist and found the reason for her weight loss is new diagnosis of hyperthyroidism. She recently started Tapazole  and states that she is tolerating nicely so far.  Weight today is 87 lbs. She states that appetite and hydration are good.  No fatigue.  No fever, chills, n/v, cough, rash, dizziness, SOB, chest pain, palpitations, abdominal pain or changes in bowel or bladder habits at this time.   No swelling, numbness or tingling in her extremities at this time.   ECOG Performance Status: 1 - Symptomatic but completely ambulatory  Medications:  Allergies as of 01/02/2024       Reactions   Grass Pollen(k-o-r-t-swt Vern) Cough        Medication List        Accurate as of January 02, 2024  1:45 PM. If you have any questions, ask your nurse or doctor.          acetaminophen  500 MG tablet Commonly known as: TYLENOL  Take 500 mg by mouth every 6 (six) hours as needed for mild pain (pain score 1-3).   albuterol  108 (90 Base) MCG/ACT inhaler Commonly known as: VENTOLIN  HFA INHALE 2 PUFFS INTO THE LUNGS EVERY 6 HOURS AS NEEDED FOR SHORTNESS OF BREATH/WHEEZING   ALPRAZolam  0.5 MG tablet Commonly known as: Xanax  Take one half tab twice daily as needed for anxiety. What changed:  how much to take how to take this when to take this reasons to take this   amLODipine  2.5 MG tablet Commonly known as: NORVASC  TAKE 1 TABLET BY MOUTH EVERY DAY   cholestyramine light 4  g packet Commonly known as: PREVALITE Take 4 g by mouth daily. Patient is taking as needed   cyanocobalamin  1000 MCG/ML injection Commonly known as: VITAMIN B12 INJECT 1ML INTO THE MUSCLE ONCE MONTHLY AS DIRECTED   esomeprazole  40 MG capsule Commonly known as: NEXIUM  TAKE 1 CAPSULE BY MOUTH DAILY AT 12 NOON   hyoscyamine 0.125 MG SL tablet Commonly known as: LEVSIN SL Take 0.125 mg by mouth every 4 (four) hours as needed for cramping.   lipase/protease/amylase 12000-38000 units Cpep capsule Commonly known as: CREON  Take 36,000 Units by mouth 3 (three) times daily before meals.   meclizine  25 MG tablet Commonly known as: ANTIVERT  Take 1 tablet (25 mg total) by mouth 3 (three) times daily as needed for dizziness.   methimazole  5 MG tablet Commonly known as: TAPAZOLE  Take 1 tablet (5 mg total) by mouth daily.   methocarbamol  500 MG tablet Commonly known as: ROBAXIN  TAKE 1 TABLET BY MOUTH EVERY 8 HOURS AS NEEDED FOR MUSCLE SPASMS.   MULTIVITAMIN GUMMIES WOMENS PO Take 1 tablet by mouth daily.   pregabalin  50 MG capsule Commonly known as: LYRICA  TAKE 1 CAPSULE BY MOUTH THREE TIMES A DAY   Prolia  60 MG/ML Sosy injection Generic drug: denosumab  Inject 60 mg into the skin every 6 (six) months. Dx code: m81.0.  pt has appt on 05/29/23  rosuvastatin  5 MG tablet Commonly known as: CRESTOR  TAKE 1 TABLET (5 MG TOTAL) BY MOUTH DAILY.   Tylenol  Precise Pain Relieving 4 % cream Generic drug: lidocaine  Apply 1 Application topically daily as needed.        Allergies: Allergies[1]  Past Medical History, Surgical history, Social history, and Family History were reviewed and updated.  Review of Systems: All other 10 point review of systems is negative.   Physical Exam:  height is 5' 4 (1.626 m) and weight is 87 lb (39.5 kg). Her oral temperature is 97.8 F (36.6 C). Her blood pressure is 121/62 and her pulse is 72. Her respiration is 18 and oxygen  saturation is 100%.    Wt Readings from Last 3 Encounters:  01/02/24 87 lb (39.5 kg)  12/10/23 84 lb (38.1 kg)  12/07/23 84 lb (38.1 kg)    Ocular: Sclerae unicteric, pupils equal, round and reactive to light Ear-nose-throat: Oropharynx clear, dentition fair Lymphatic: No cervical or supraclavicular adenopathy Lungs no rales or rhonchi, good excursion bilaterally Heart regular rate and rhythm, no murmur appreciated Abd soft, nontender, positive bowel sounds MSK no focal spinal tenderness, no joint edema Neuro: non-focal, well-oriented, appropriate affect Breasts: Deferred   Lab Results  Component Value Date   WBC 2.9 (L) 01/02/2024   HGB 11.5 (L) 01/02/2024   HCT 35.6 (L) 01/02/2024   MCV 93.9 01/02/2024   PLT 156 01/02/2024   Lab Results  Component Value Date   FERRITIN 176 05/04/2023   IRON  114 05/04/2023   TIBC 392 05/04/2023   UIBC 278 05/04/2023   IRONPCTSAT 29 05/04/2023   Lab Results  Component Value Date   RETICCTPCT 1.0 05/04/2023   RBC 3.79 (L) 01/02/2024   RETICCTABS 45.7 06/12/2014   No results found for: KPAFRELGTCHN, LAMBDASER, KAPLAMBRATIO No results found for: IGGSERUM, IGA, IGMSERUM No results found for: STEPHANY CARLOTA BENSON MARKEL EARLA JOANNIE DOC VICK, SPEI   Chemistry      Component Value Date/Time   NA 142 01/02/2024 1216   NA 144 06/12/2016 1151   NA 142 06/11/2015 1156   K 4.4 01/02/2024 1216   K 4.7 06/12/2016 1151   K 4.3 06/11/2015 1156   CL 106 01/02/2024 1216   CL 107 06/12/2016 1151   CO2 26 01/02/2024 1216   CO2 32 06/12/2016 1151   CO2 29 06/11/2015 1156   BUN 14 01/02/2024 1216   BUN 11 06/12/2016 1151   BUN 13.5 06/11/2015 1156   CREATININE 1.00 01/02/2024 1216   CREATININE 1.07 (H) 05/29/2023 1127   CREATININE 1.2 (H) 06/11/2015 1156      Component Value Date/Time   CALCIUM  9.4 01/02/2024 1216   CALCIUM  9.6 06/12/2016 1151   CALCIUM  9.7 06/11/2015 1156   ALKPHOS 82 01/02/2024 1216    ALKPHOS 97 (H) 06/12/2016 1151   ALKPHOS 111 06/11/2015 1156   AST 38 01/02/2024 1216   AST 30 06/11/2015 1156   ALT 29 01/02/2024 1216   ALT 26 06/12/2016 1151   ALT 23 06/11/2015 1156   BILITOT 0.7 01/02/2024 1216   BILITOT 1.03 06/11/2015 1156       Impression and Plan: Erika Murphy is a 73 yo African [place] female with remote history of stage II stomach cancer. She underwent resection followed by adjuvant therapy. She is now over 23 years out from treatment.  She is doing well and has no complaints at this time.  Iron  studies pending. We will replace if needed.  Follow-up in 4 months.  Lauraine Pepper, NP 12/31/20251:45 PM     [1]  Allergies Allergen Reactions   Grass Pollen(K-O-R-T-Swt Vern) Cough   "

## 2024-01-03 LAB — PREALBUMIN: Prealbumin: 19 mg/dL (ref 18–38)

## 2024-01-16 ENCOUNTER — Other Ambulatory Visit: Payer: Self-pay

## 2024-01-16 NOTE — Patient Outreach (Addendum)
 Complex Care Management   Visit Note  01/16/2024  Name:  Erika Murphy MRN: [id number] DOB: 1950-03-20  Situation: Referral received for Complex Care Management related to Multiple Sclerosis, low body weight, Carcinoma in situ of stomach, Osteoporosis, Reactive gastropathy, weight loss, lumbar degenerative changes with back pain, bilateral knee pain, Abnormal TSH. I obtained verbal consent from Patient.  Visit completed with Patient on the phone.  Background:   Past Medical History:  Diagnosis Date   Anxiety    Arthritis    right shoulder (10/09/2013)   Asthma    pt denies   B12 deficiency anemia    pt denies   Fibromyalgia    GERD (gastroesophageal reflux disease)    History of blood transfusion 2001   when I had my stomach cancer   Hypertension    IBS (irritable bowel syndrome)    Iron  deficiency anemia 12/11/2014   Malabsorption of iron  12/11/2014   MS (multiple sclerosis) dx'd ~ 1998   Neurogenic bladder    due to MS   Osteopenia    Osteoporosis    PONV (postoperative nausea and vomiting)    Stomach cancer (HCC) 2001   Thyroid  nodule    right    Assessment: Patient Reported Symptoms:  Cognitive Cognitive Status: Alert and oriented to person, place, and time, Normal speech and language skills Cognitive/Intellectual Conditions Management [RPT]: None reported or documented in medical history or problem list   Health Maintenance Behaviors: Annual physical exam, Healthy diet, Sleep adequate Healing Pattern: Average Health Facilitated by: Healthy diet, Rest  Neurological Neurological Review of Symptoms: Other:, Numbness, Headaches, Dizziness Oher Neurological Symptoms/Conditions [RPT]: coolness to right lower extremity secondary to MS; educated patient re: common SE related to methimazole  Neurological Management Strategies: Adequate rest, Routine screening Neurological Self-Management Outcome: 4 (good)  HEENT HEENT Symptoms Reported: Sudden change or loss of  vision HEENT Management Strategies: Routine screening HEENT Self-Management Outcome: 3 (uncertain) HEENT Comment: patient completed a recent eye exam, no new diagnoses provied by Eye Specialist    Cardiovascular Cardiovascular Symptoms Reported: No symptoms reported    Respiratory Respiratory Symptoms Reported: No symptoms reported    Endocrine Endocrine Symptoms Reported: Weight loss Is patient diabetic?: No Endocrine Self-Management Outcome: 3 (uncertain)  Gastrointestinal Gastrointestinal Symptoms Reported: Change in appetite Gastrointestinal Management Strategies: Medication therapy, Diet modification Gastrointestinal Self-Management Outcome: 4 (good) Gastrointestinal Comment: patient states her weight is stable    Genitourinary Genitourinary Symptoms Reported: No symptoms reported    Integumentary Integumentary Symptoms Reported: No symptoms reported    Musculoskeletal Musculoskelatal Symptoms Reviewed: Limited mobility Additional Musculoskeletal Details: using Rollator for ambulation Musculoskeletal Management Strategies: Adequate rest, Routine screening, Medical device Musculoskeletal Self-Management Outcome: 4 (good) Falls in the past year?: No Number of falls in past year: 1 or less Was there an injury with Fall?: No Fall Risk Category Calculator: 0 Patient Fall Risk Level: Low Fall Risk Patient at Risk for Falls Due to: Impaired balance/gait, Impaired mobility, Impaired vision Fall risk Follow up: Falls evaluation completed, Education provided, Falls prevention discussed  Psychosocial Psychosocial Symptoms Reported: No symptoms reported   Major Change/Loss/Stressor/Fears (CP): Denies      01/16/2024    PHQ2-9 Depression Screening   Zarion Oliff interest or pleasure in doing things    Feeling down, depressed, or hopeless    PHQ-2 - Total Score    Trouble falling or staying asleep, or sleeping too much    Feeling tired or having Tomasina Keasling energy    Poor appetite or  overeating  Feeling bad about yourself - or that you are a failure or have let yourself or your family down    Trouble concentrating on things, such as reading the newspaper or watching television    Moving or speaking so slowly that other people could have noticed.  Or the opposite - being so fidgety or restless that you have been moving around a lot more than usual    Thoughts that you would be better off dead, or hurting yourself in some way    PHQ2-9 Total Score    If you checked off any problems, how difficult have these problems made it for you to do your work, take care of things at home, or get along with other people    Depression Interventions/Treatment      There were no vitals filed for this visit. Pain Scale: Not given for pain  Medications Reviewed Today     Reviewed by Morgan Clayborne CROME, RN (Registered Nurse) on 01/16/24 at 1326  Med List Status: <None>   Medication Order Taking? Sig Documenting Provider Last Dose Status Informant  acetaminophen  (TYLENOL ) 500 MG tablet [id number]  Take 500 mg by mouth every 6 (six) hours as needed for mild pain (pain score 1-3). [provider]  Active   albuterol  (VENTOLIN  HFA) 108 (90 Base) MCG/ACT inhaler [id number]  INHALE 2 PUFFS INTO THE LUNGS EVERY 6 HOURS AS NEEDED FOR SHORTNESS OF BREATH/WHEEZING Perri Ronal PARAS, MD  Active Self  ALPRAZolam  (XANAX ) 0.5 MG tablet [id number]  Take one half tab twice daily as needed for anxiety.  Patient taking differently: Take 0.25-0.5 mg by mouth 2 (two) times daily as needed for anxiety. Take one half tab twice daily as needed for anxiety.   Perri Ronal PARAS, MD  Active Self  amLODipine  (NORVASC ) 2.5 MG tablet [id number]  TAKE 1 TABLET BY MOUTH EVERY DAY Baxley, Ronal PARAS, MD  Active   cholestyramine light (PREVALITE) 4 g packet [id number]  Take 4 g by mouth daily. Patient is taking as needed [provider]  Active Self  cyanocobalamin  (VITAMIN B12) 1000 MCG/ML injection [id number]  INJECT  1ML INTO THE MUSCLE ONCE MONTHLY AS DIRECTED Baxley, Ronal PARAS, MD  Active   denosumab  (PROLIA ) 60 MG/ML SOSY injection [id number]  Inject 60 mg into the skin every 6 (six) months. Dx code: m81.0.  pt has appt on 05/29/23 Perri Ronal PARAS, MD  Active   esomeprazole  (NEXIUM ) 40 MG capsule [id number]  TAKE 1 CAPSULE BY MOUTH DAILY AT 12 NOON Baxley, Ronal PARAS, MD  Active   hyoscyamine (LEVSIN SL) 0.125 MG SL tablet [id number]  Take 0.125 mg by mouth every 4 (four) hours as needed for cramping. [provider]  Active   lidocaine  (TYLENOL  PRECISE PAIN RELIEVING) 4 % cream [id number]  Apply 1 Application topically daily as needed. [provider]  Active   lipase/protease/amylase (CREON ) 12000-38000 units CPEP capsule [id number]  Take 36,000 Units by mouth 3 (three) times daily before meals. [provider]  Active   meclizine  (ANTIVERT ) 25 MG tablet [id number]  Take 1 tablet (25 mg total) by mouth 3 (three) times daily as needed for dizziness. Perri Ronal PARAS, MD  Active Self  methimazole  (TAPAZOLE ) 5 MG tablet [id number]  Take 1 tablet (5 mg total) by mouth daily. Shamleffer, Ibtehal Jaralla, MD  Active   methocarbamol  (ROBAXIN ) 500 MG tablet [id number]  TAKE 1 TABLET BY MOUTH EVERY 8 HOURS AS NEEDED FOR MUSCLE SPASMS. Perri Ronal PARAS, MD  Active  Multiple Vitamins-Minerals (MULTIVITAMIN GUMMIES WOMENS PO) [id number]  Take 1 tablet by mouth daily. [provider]  Active Self  pregabalin  (LYRICA ) 50 MG capsule [id number]  TAKE 1 CAPSULE BY MOUTH THREE TIMES A DAY Baxley, Ronal PARAS, MD  Active   rosuvastatin  (CRESTOR ) 5 MG tablet [id number]  TAKE 1 TABLET (5 MG TOTAL) BY MOUTH DAILY. Perri Ronal PARAS, MD  Active             Recommendation:   Continue Current Plan of Care   Follow Up Plan:   Closing from Complex Care Management   Clayborne Ly RN BSN CCM [facility]  [facility], [facility] Nurse Care Coordinator  Direct Dial: [phone] Website:  [email or web address]

## 2024-01-16 NOTE — Patient Instructions (Signed)
 Visit Information  Thank you for taking time to visit with me today.    Please call [phone]-TALK (toll free, 24 hour hotline) if you are experiencing a Mental Health or Behavioral Health Crisis or need someone to talk to.  Clayborne Ly RN BSN CCM [place]  [facility], [facility] Nurse Care Coordinator  Direct Dial: [phone] Website: [email or web address]

## 2024-01-17 ENCOUNTER — Other Ambulatory Visit: Payer: Self-pay | Admitting: Internal Medicine

## 2024-01-18 ENCOUNTER — Other Ambulatory Visit

## 2024-01-19 LAB — TSH: TSH: 1.45 m[IU]/L (ref 0.40–4.50)

## 2024-01-19 LAB — T4, FREE: Free T4: 1 ng/dL (ref 0.8–1.8)

## 2024-02-05 ENCOUNTER — Telehealth: Payer: Self-pay

## 2024-02-05 NOTE — Telephone Encounter (Signed)
 Patient is calling concerned about hair loss since starting this new medication.  She is worried she will be bald before she is seen again.  Please advise.

## 2024-02-08 ENCOUNTER — Ambulatory Visit: Admitting: Internal Medicine

## 2024-02-08 ENCOUNTER — Encounter: Payer: Self-pay | Admitting: Internal Medicine

## 2024-02-08 VITALS — BP 110/70 | HR 87 | Ht 64.0 in | Wt 87.0 lb

## 2024-02-08 DIAGNOSIS — L659 Nonscarring hair loss, unspecified: Secondary | ICD-10-CM

## 2024-02-08 NOTE — Progress Notes (Unsigned)
 Erika Murphy

## 2024-02-08 NOTE — Progress Notes (Shared)
 "   Patient Care Team: Baxley, Ronal PARAS, MD as PCP - General (Internal Medicine) Timmy Maude SAUNDERS, MD as Consulting Physician (Oncology) Morgan Clayborne CROME, RN as [facility] Care Management  Visit Date: 02/08/24  Subjective:    Patient ID: Erika Murphy , Female   DOB: May 28, 1950, 74 y.o.    MRN: [id number]   74 y.o. Female presents today for Hair loss. Patient has a past medical history of Hyperthyroidism, Multiple sclerosis, Anxiety, Iron  deficiency, Osteoporosis, B12 deficiency, Gastric cancer.  She has noticed that her hair has been falling out for about 6 months. She dyes her hair but has not done so for about a month. She has also noticed that she has headaches and areas of redness on the right side of her scalp. 01/02/2024 Iron  and Iron  Binding capacity Iron  112, TIBC 374, Saturation ratio 30, UIBC 262.  CBC WBC 2.9, RBC 3.79, Hemoglobin 11.5, HCT 35.6, Absolute neutrophil 1.4, Otherwise WNL. CMP GFR 59. Lactate Dehydrogenase 183, Prealbumin 19.    History of Hyperthyroidism treated with Methimazole  5 mg daily except for Sundays. Followed by endocrinologist, Dr. Sam who she last saw 12/07/2023. 12/07/2023 TSH 0.04, Free T3 3.9, Free T4 1.2.    History of weight loss.Current weight is 87 Lbs BMI 14.93. She has gained 5 lbs since 11/09/2023. In 2001 she has a subtotal gastrectomy and partial omentectomy due to carcinoma of the stomach.     Last year, she had some left-sided abdominal pain in 08/2022, which prompted her to present to the ED. Subsequently underwent an upper endoscopy by Dr. Belvie Just at [facility] where diffuse moderate inflammation w/ erythema and friability found in gastric fundus and body; patent Biliroth II gastrojejunostomy visualized w/ a possible polyp at the anastomosis margin. Pathology determined Reactive Gastropathy w/ Gastric Xanthelasma (no H. Pylori identified and no intestinal metaplasia detected) and Fragments of Pyloric Gland Adenoma w/ High-Grade Dysplasia  (no malignancy or H. Pylori identified and no intestinal metaplasia detected).             Repeat Endoscopy in November w/ a single 2 mm mucosal papule w/o bleeding or stigmata found in gastric body (gastric hyperplastic polyp per pathology; no dysplasia, malignancy, or H. Pylori identified and no intestinal metaplasia detected) and diffusely severe erythematous mucosa w/ bleeding on contact found in entire examined stomach (reactive gastropathy per pathology; no dysplasia, malignancy, or H. Pylori identified and no intestinal metaplasia detected). Following this she was placed on Nexium  40 mg daily.      On 08/30/23 She had an endoscopy by Dr. Just which found that she had chronic superficial gastritis and her stomach was biopsied. Biopsy revealed that she had reactive gastropathy with focal erosion. On 09/17/2023 She was prescribed hyoscyamine sulfate 0.125 mg sublingual every 4 hours for stomach cramping. She is also taking Cholestyramine 4 g daily. This has helped tremendously. She is feeling better. Her appetite has improved. No more complaint of epigastric pain. She looks well and seems much happier.     Past Medical History:  Diagnosis Date   Anxiety    Arthritis    right shoulder (10/09/2013)   Asthma    pt denies   B12 deficiency anemia    pt denies   Fibromyalgia    GERD (gastroesophageal reflux disease)    History of blood transfusion 2001   when I had my stomach cancer   Hypertension    IBS (irritable bowel syndrome)    Iron  deficiency anemia 12/11/2014  Malabsorption of iron  12/11/2014   MS (multiple sclerosis) dx'd ~ 1998   Neurogenic bladder    due to MS   Osteopenia    Osteoporosis    PONV (postoperative nausea and vomiting)    Stomach cancer (HCC) 2001   Thyroid  nodule    right     Family History  Problem Relation Age of Onset   Heart disease Mother    Diabetes Father    Stomach cancer Brother    Multiple sclerosis Other    Breast cancer Sister        2  sisters breast cancer    Breast cancer Maternal Aunt     Social History   Social History Narrative   Patient is retired.    Patient is married with 3 children.   Patient drinks caffeine daily.       Review of Systems  Skin:        Hair loss  All other systems reviewed and are negative.       Objective:   Vitals: BP 110/70   Pulse 87   Ht 5' 4 (1.626 m)   Wt 87 lb (39.5 kg)   SpO2 97%   BMI 14.93 kg/m    Physical Exam Skin:    Findings: Erythema present.       Results:   Studies obtained and personally reviewed by me:  Labs:       Component Value Date/Time   NA 142 01/02/2024 1216   NA 144 06/12/2016 1151   NA 142 06/11/2015 1156   K 4.4 01/02/2024 1216   K 4.7 06/12/2016 1151   K 4.3 06/11/2015 1156   CL 106 01/02/2024 1216   CL 107 06/12/2016 1151   CO2 26 01/02/2024 1216   CO2 32 06/12/2016 1151   CO2 29 06/11/2015 1156   GLUCOSE 81 01/02/2024 1216   GLUCOSE 88 06/12/2016 1151   BUN 14 01/02/2024 1216   BUN 11 06/12/2016 1151   BUN 13.5 06/11/2015 1156   CREATININE 1.00 01/02/2024 1216   CREATININE 1.07 (H) 05/29/2023 1127   CREATININE 1.2 (H) 06/11/2015 1156   CALCIUM  9.4 01/02/2024 1216   CALCIUM  9.6 06/12/2016 1151   CALCIUM  9.7 06/11/2015 1156   PROT 7.5 01/02/2024 1216   PROT 7.5 06/12/2016 1151   PROT 7.5 06/11/2015 1156   ALBUMIN 4.2 01/02/2024 1216   ALBUMIN 3.8 06/12/2016 1151   ALBUMIN 3.8 06/11/2015 1156   AST 38 01/02/2024 1216   AST 30 06/11/2015 1156   ALT 29 01/02/2024 1216   ALT 26 06/12/2016 1151   ALT 23 06/11/2015 1156   ALKPHOS 82 01/02/2024 1216   ALKPHOS 97 (H) 06/12/2016 1151   ALKPHOS 111 06/11/2015 1156   BILITOT 0.7 01/02/2024 1216   BILITOT 1.03 06/11/2015 1156   GFRNONAA 59 (L) 01/02/2024 1216   GFRNONAA 73 01/16/2020 0949   GFRAA 85 01/16/2020 0949     Lab Results  Component Value Date   WBC 2.9 (L) 01/02/2024   HGB 11.5 (L) 01/02/2024   HCT 35.6 (L) 01/02/2024   MCV 93.9 01/02/2024   PLT  156 01/02/2024    Lab Results  Component Value Date   CHOL 185 09/21/2023   HDL 76 09/21/2023   LDLCALC 90 09/21/2023   TRIG 97 09/21/2023   CHOLHDL 2.4 09/21/2023    Lab Results  Component Value Date   HGBA1C 5.0 05/13/2021     Lab Results  Component Value Date   TSH 1.45 01/18/2024  Assessment & Plan:   Orders Placed This Encounter  Procedures   Magnesium    Ambulatory referral to Dermatology    Referral Priority:   Routine    Referral Type:   Consultation    Referral Reason:   Specialty Services Required    Requested Specialty:   Dermatology    Number of Visits Requested:   1    Hair loss: She has noticed that her hair has been falling out for about 6 months. She dyes her hair but has not done so for about a month. She has also noticed that she has headaches and areas of redness on the right side of her scalp. 01/02/2024 Iron  and Iron  Binding capacity Iron  112, TIBC 374, Saturation ratio 30, UIBC 262.  CBC WBC 2.9, RBC 3.79, Hemoglobin 11.5, HCT 35.6, Absolute neutrophil 1.4, Otherwise WNL. CMP GFR 59. Lactate Dehydrogenase 183, Prealbumin 19.     Referred to Dermatology.    Magnesium  collected.  Hyperthyroidism: treated with Methimazole  5 mg daily except for Sundays. Followed by endocrinologist, Dr. Sam who she last saw 12/07/2023. 12/07/2023 TSH 0.04, Free T3 3.9, Free T4 1.2.    weight loss: Current weight is 87 Lbs BMI 14.93. She has gained 5 lbs since 11/09/2023. In 2001 she has a subtotal gastrectomy and partial omentectomy due to carcinoma of the stomach.    Last year, she had some left-sided abdominal pain in 08/2022, which prompted her to present to the ED. Subsequently underwent an upper endoscopy by Dr. Belvie Just at [facility] where diffuse moderate inflammation w/ erythema and friability found in gastric fundus and body; patent Biliroth II gastrojejunostomy visualized w/ a possible polyp at the anastomosis margin. Pathology determined  Reactive Gastropathy w/ Gastric Xanthelasma (no H. Pylori identified and no intestinal metaplasia detected) and Fragments of Pyloric Gland Adenoma w/ High-Grade Dysplasia (no malignancy or H. Pylori identified and no intestinal metaplasia detected).             Repeat Endoscopy in November w/ a single 2 mm mucosal papule w/o bleeding or stigmata found in gastric body (gastric hyperplastic polyp per pathology; no dysplasia, malignancy, or H. Pylori identified and no intestinal metaplasia detected) and diffusely severe erythematous mucosa w/ bleeding on contact found in entire examined stomach (reactive gastropathy per pathology; no dysplasia, malignancy, or H. Pylori identified and no intestinal metaplasia detected). Following this she was placed on Nexium  40 mg daily.      On 08/30/23 She had an endoscopy by Dr. Just which found that she had chronic superficial gastritis and her stomach was biopsied. Biopsy revealed that she had reactive gastropathy with focal erosion. On 09/17/2023 She was prescribed hyoscyamine sulfate 0.125 mg sublingual every 4 hours for stomach cramping. She is also taking Cholestyramine 4 g daily. This has helped tremendously. She is feeling better. Her appetite has improved. No more complaint of epigastric pain. She looks well and seems much happier.    I,Makayla C Reid,acting as a scribe for Ronal JINNY Hailstone, MD.,have documented all relevant documentation on the behalf of Ronal JINNY Hailstone, MD,as directed by  Ronal JINNY Hailstone, MD while in the presence of Ronal JINNY Hailstone, MD.      "

## 2024-03-28 ENCOUNTER — Ambulatory Visit: Admitting: Internal Medicine

## 2024-05-02 ENCOUNTER — Inpatient Hospital Stay: Admitting: Family

## 2024-05-02 ENCOUNTER — Inpatient Hospital Stay
